# Patient Record
Sex: Male | Born: 1965 | Race: White | Hispanic: No | Marital: Married
Health system: Midwestern US, Community
[De-identification: ages and names within clinical notes are randomized; demographics above are authoritative.]

## PROBLEM LIST (undated history)

## (undated) DIAGNOSIS — J449 Chronic obstructive pulmonary disease, unspecified: Secondary | ICD-10-CM

## (undated) DIAGNOSIS — J439 Emphysema, unspecified: Secondary | ICD-10-CM

## (undated) DIAGNOSIS — J189 Pneumonia, unspecified organism: Secondary | ICD-10-CM

## (undated) HISTORY — PX: BACK SURGERY: SHX140

## (undated) HISTORY — DX: Emphysema, unspecified: J43.9

## (undated) HISTORY — PX: CARDIAC VALVE REPLACEMENT: SHX585

## (undated) MED FILL — Dexamethasone Sodium Phosphate Inj 100 MG/10ML: INTRAMUSCULAR | Qty: 1 | Status: AC

---

## 2004-11-11 ENCOUNTER — Emergency Department: Payer: Self-pay | Admitting: Emergency Medicine

## 2004-11-12 ENCOUNTER — Other Ambulatory Visit: Payer: Self-pay

## 2005-05-30 ENCOUNTER — Emergency Department: Payer: Self-pay | Admitting: Unknown Physician Specialty

## 2008-01-30 ENCOUNTER — Emergency Department: Payer: Self-pay | Admitting: Emergency Medicine

## 2010-12-08 ENCOUNTER — Inpatient Hospital Stay: Payer: Self-pay | Admitting: Internal Medicine

## 2012-11-05 ENCOUNTER — Emergency Department: Payer: Self-pay | Admitting: Emergency Medicine

## 2012-11-05 LAB — BASIC METABOLIC PANEL
BUN: 10 mg/dL (ref 7–18)
Chloride: 107 mmol/L (ref 98–107)
Co2: 26 mmol/L (ref 21–32)
Creatinine: 1.07 mg/dL (ref 0.60–1.30)
EGFR (Non-African Amer.): >60
Osmolality: 275 (ref 275–301)
Potassium: 3.9 mmol/L (ref 3.5–5.1)
Sodium: 137 mmol/L (ref 136–145)

## 2012-11-05 LAB — CBC
HCT: 41.4 % (ref 40.0–52.0)
HGB: 13.7 g/dL (ref 13.0–18.0)
MCH: 30.2 pg (ref 26.0–34.0)
MCHC: 33 g/dL (ref 32.0–36.0)
RBC: 4.52 10*6/uL (ref 4.40–5.90)
RDW: 13 % (ref 11.5–14.5)
WBC: 10 10*3/uL (ref 3.8–10.6)

## 2013-11-06 ENCOUNTER — Emergency Department: Payer: Self-pay | Admitting: Emergency Medicine

## 2013-11-28 DIAGNOSIS — M5416 Radiculopathy, lumbar region: Secondary | ICD-10-CM | POA: Insufficient documentation

## 2013-11-28 DIAGNOSIS — M549 Dorsalgia, unspecified: Secondary | ICD-10-CM | POA: Insufficient documentation

## 2014-04-30 ENCOUNTER — Emergency Department: Payer: Self-pay | Admitting: Emergency Medicine

## 2014-08-25 ENCOUNTER — Encounter (HOSPITAL_COMMUNITY): Payer: Self-pay | Admitting: Emergency Medicine

## 2014-08-25 ENCOUNTER — Emergency Department (HOSPITAL_COMMUNITY)
Admission: EM | Admit: 2014-08-25 | Discharge: 2014-08-25 | Disposition: A | Discharge status: Home / Self Care | Payer: Self-pay | Attending: Emergency Medicine | Admitting: Emergency Medicine

## 2014-08-25 ENCOUNTER — Emergency Department (HOSPITAL_COMMUNITY): Payer: Self-pay

## 2014-08-25 DIAGNOSIS — R0602 Shortness of breath: Secondary | ICD-10-CM | POA: Insufficient documentation

## 2014-08-25 DIAGNOSIS — J441 Chronic obstructive pulmonary disease with (acute) exacerbation: Secondary | ICD-10-CM | POA: Insufficient documentation

## 2014-08-25 DIAGNOSIS — F17200 Nicotine dependence, unspecified, uncomplicated: Secondary | ICD-10-CM | POA: Insufficient documentation

## 2014-08-25 DIAGNOSIS — R079 Chest pain, unspecified: Secondary | ICD-10-CM | POA: Insufficient documentation

## 2014-08-25 HISTORY — DX: Chronic obstructive pulmonary disease, unspecified: J44.9

## 2014-08-25 LAB — BASIC METABOLIC PANEL
Anion gap: 15 (ref 5–15)
BUN: 4 mg/dL — ABNORMAL LOW (ref 6–23)
CO2: 21 mEq/L (ref 19–32)
Calcium: 9.2 mg/dL (ref 8.4–10.5)
Chloride: 96 mEq/L (ref 96–112)
Creatinine, Ser: 0.81 mg/dL (ref 0.50–1.35)
GFR calc Af Amer: >90 mL/min (ref >90)
Glucose, Bld: 96 mg/dL (ref 70–99)
POTASSIUM: 3.9 meq/L (ref 3.7–5.3)
SODIUM: 132 meq/L — AB (ref 137–147)

## 2014-08-25 LAB — CBC
HCT: 44.4 % (ref 39.0–52.0)
HEMOGLOBIN: 15.9 g/dL (ref 13.0–17.0)
MCH: 31.2 pg (ref 26.0–34.0)
MCHC: 35.8 g/dL (ref 30.0–36.0)
MCV: 87.2 fL (ref 78.0–100.0)
Platelets: 308 10*3/uL (ref 150–400)
RBC: 5.09 MIL/uL (ref 4.22–5.81)
RDW: 12.8 % (ref 11.5–15.5)
WBC: 8.7 10*3/uL (ref 4.0–10.5)

## 2014-08-25 LAB — PRO B NATRIURETIC PEPTIDE: PRO B NATRI PEPTIDE: 127.5 pg/mL — AB (ref 0–125)

## 2014-08-25 LAB — TROPONIN I: Troponin I: <0.3 ng/mL (ref <0.30)

## 2014-08-25 LAB — I-STAT TROPONIN, ED: TROPONIN I, POC: 0 ng/mL (ref 0.00–0.08)

## 2014-08-25 MED ORDER — PREDNISONE 20 MG PO TABS: 60.0000 mg | ORAL_TABLET | Freq: Every day | ORAL | Status: DC

## 2014-08-25 MED ORDER — ALBUTEROL SULFATE HFA 108 (90 BASE) MCG/ACT IN AERS: 2.0000 | INHALATION_SPRAY | RESPIRATORY_TRACT | Status: DC | PRN

## 2014-08-25 MED ORDER — KETOROLAC TROMETHAMINE 30 MG/ML IJ SOLN
30.0000 mg | Freq: Once | INTRAMUSCULAR | Status: AC
  Administered 2014-08-25: 30 mg via INTRAVENOUS
  Filled 2014-08-25: qty 1

## 2014-08-25 MED ORDER — HYDROCODONE-ACETAMINOPHEN 5-325 MG PO TABS: 2.0000 | ORAL_TABLET | ORAL | Status: DC | PRN

## 2014-08-25 MED ORDER — METHYLPREDNISOLONE SODIUM SUCC 125 MG IJ SOLR
125.0000 mg | Freq: Once | INTRAMUSCULAR | Status: AC
  Administered 2014-08-25: 125 mg via INTRAVENOUS
  Filled 2014-08-25: qty 2

## 2014-08-25 MED ORDER — AMOXICILLIN 500 MG PO CAPS: 500.0000 mg | ORAL_CAPSULE | Freq: Three times a day (TID) | ORAL | Status: DC

## 2014-08-25 MED ORDER — ALBUTEROL (5 MG/ML) CONTINUOUS INHALATION SOLN
10.0000 mg/h | INHALATION_SOLUTION | Freq: Once | RESPIRATORY_TRACT | Status: AC
  Administered 2014-08-25: 10 mg/h via RESPIRATORY_TRACT
  Filled 2014-08-25: qty 20

## 2014-08-25 NOTE — ED Provider Notes (Signed)
CSN: [id number]     Arrival date & time 08/25/14  2013 History   First MD Initiated Contact with Patient 08/25/14 2027     Chief Complaint  Patient presents with  . Chest Pain  . Shortness of Breath     (Consider location/radiation/quality/duration/timing/severity/associated sxs/prior Treatment) HPI Comments: Patient presents to the ER for evaluation of chest pain and shortness of breath. Symptoms began yesterday. Patient had onset of a sharp pain in the axilla region and a crampy pain across the left chest. He has had shortness of breath and cough associated with the symptoms. He reports 3-2 aspirin last night and it helped the pain, he went to sleep and woke up feeling better. This afternoon and evening he started having increasing pain, increasing shortness of breath, increasing cough. Pain is once again, a sharp and stabbing in nature, worsens with his cough and breathing. Patient has a history of COPD, he has been using his Proventil inhaler without improvement.  Patient is a 48 y.o. male presenting with chest pain and shortness of breath.  Chest Pain Associated symptoms: shortness of breath   Shortness of Breath Associated symptoms: chest pain     Past Medical History  Diagnosis Date  . COPD (chronic obstructive pulmonary disease)    Past Surgical History  Procedure Laterality Date  . Back surgery     History reviewed. No pertinent family history. History  Substance Use Topics  . Smoking status: Current Every Day Smoker  . Smokeless tobacco: Not on file  . Alcohol Use: Yes    Review of Systems  Respiratory: Positive for shortness of breath.   Cardiovascular: Positive for chest pain.  All other systems reviewed and are negative.     Allergies  Review of patient's allergies indicates no known allergies.  Home Medications   Prior to Admission medications   Medication Sig Start Date End Date Taking? Authorizing Provider  albuterol (PROVENTIL HFA;VENTOLIN HFA) 108  (90 BASE) MCG/ACT inhaler Inhale 2 puffs into the lungs as needed for wheezing or shortness of breath.   Yes Historical Provider, MD  aspirin EC 81 MG tablet Take 162 mg by mouth daily as needed (chest pain).   Yes Historical Provider, MD   BP 113/79  Pulse 78  Temp(Src) 98.1 F (36.7 C) (Oral)  Resp 21  Ht 6\' 2"  (1.88 m)  Wt 212 lb (96.163 kg)  BMI 27.21 kg/m2  SpO2 94% Physical Exam  Constitutional: He is oriented to person, place, and time. He appears well-developed and well-nourished. No distress.  HENT:  Head: Normocephalic and atraumatic.  Right Ear: Hearing normal.  Left Ear: Hearing normal.  Nose: Nose normal.  Mouth/Throat: Oropharynx is clear and moist and mucous membranes are normal.  Eyes: Conjunctivae and EOM are normal. Pupils are equal, round, and reactive to light.  Neck: Normal range of motion. Neck supple.  Cardiovascular: Regular rhythm, S1 normal and S2 normal.  Exam reveals no gallop and no friction rub.   No murmur heard. Pulmonary/Chest: Effort normal. No respiratory distress. He has decreased breath sounds. He has wheezes. He has rhonchi. He exhibits no tenderness.  Abdominal: Soft. Normal appearance and bowel sounds are normal. There is no hepatosplenomegaly. There is no tenderness. There is no rebound, no guarding, no tenderness at McBurney's point and negative Murphy's sign. No hernia.  Musculoskeletal: Normal range of motion.  Neurological: He is alert and oriented to person, place, and time. He has normal strength. No cranial nerve deficit or sensory deficit. Coordination  normal. GCS eye subscore is 4. GCS verbal subscore is 5. GCS motor subscore is 6.  Skin: Skin is warm, dry and intact. No rash noted. No cyanosis.  Psychiatric: He has a normal mood and affect. His speech is normal and behavior is normal. Thought content normal.    ED Course  Procedures (including critical care time) Labs Review Labs Reviewed  [place], ED    Imaging Review No results found.   EKG Interpretation   Date/Time:  Monday August 25 2014 20:20:49 EDT Ventricular Rate:  86 PR Interval:  154 QRS Duration: 80 QT Interval:  350 QTC Calculation: 418 R Axis:   35 Text Interpretation:  Normal sinus rhythm Possible Left atrial enlargement  Borderline ECG No previous tracing Confirmed by Marieke Lubke  MD, Gabrial Domine  ([id number]) on 08/25/2014 9:09:28 PM      MDM   Final diagnoses:  None   COPD exacerbation  Patient presents to the ER for evaluation of chest pain, cough, shortness of breath. Patient does have a history of COPD. Upon presentation, patient has moderate bronchospasm without any significant hypoxia. Cardiac workup was negative. Patient has been experiencing continuous pain for 2 days and it seems to be pulmonary in origin. EKG and troponin are unremarkable, no concern for acute coronary syndrome. The patient's chest x-ray did not show pneumonia or pneumothorax. No acute pathology, findings are consistent with COPD. Patient treated with Toradol, Solu-Medrol, continuous nebulizer with significant improvement in his aeration and decreased bronchospasm. Upon recheck liver oxygen saturation is 99%. Patient sleeping comfortably. Patient will be discharged with prednisone, continue bronchodilator therapy.    Orpah Greek, MD 08/25/14 [phone]

## 2014-08-25 NOTE — ED Notes (Signed)
Pt placed on monitor upon arrival to room. Pt monitored by blood pressure, pulse ox, and 5 lead. Pts family remains at bedside.

## 2014-08-25 NOTE — ED Notes (Signed)
pts vital signs updated and iv removed. Pt getting dressed awaiting discharge paperwork at bedside.

## 2014-08-25 NOTE — Discharge Instructions (Signed)
Chronic Obstructive Pulmonary Disease Exacerbation Chronic obstructive pulmonary disease (COPD) is a common lung condition in which airflow from the lungs is limited. COPD is a general term that can be used to describe many different lung problems that limit airflow, including chronic bronchitis and emphysema. COPD exacerbations are episodes when breathing symptoms become much worse and require extra treatment. Without treatment, COPD exacerbations can be life threatening, and frequent COPD exacerbations can cause further damage to your lungs. CAUSES   Respiratory infections.   Exposure to smoke.   Exposure to air pollution, chemical fumes, or dust. Sometimes there is no apparent cause or trigger. RISK FACTORS  Smoking cigarettes.  Older age.  Frequent prior COPD exacerbations. SIGNS AND SYMPTOMS   Increased coughing.   Increased thick spit (sputum) production.   Increased wheezing.   Increased shortness of breath.   Rapid breathing.   Chest tightness. DIAGNOSIS  Your medical history, a physical exam, and tests will help your health care provider make a diagnosis. Tests may include:  A chest X-ray.  Basic lab tests.  Sputum testing.  An arterial blood gas test. TREATMENT  Depending on the severity of your COPD exacerbation, you may need to be admitted to a hospital for treatment. Some of the treatments commonly used to treat COPD exacerbations are:   Antibiotic medicines.   Bronchodilators. These are drugs that expand the air passages. They may be given with an inhaler or nebulizer. Spacer devices may be needed to help improve drug delivery.  Corticosteroid medicines.  Supplemental oxygen therapy.  HOME CARE INSTRUCTIONS   Do not smoke. Quitting smoking is very important to prevent COPD from getting worse and exacerbations from happening as often.  Avoid exposure to all substances that irritate the airway, especially to tobacco smoke.   If you were  prescribed an antibiotic medicine, finish it all even if you start to feel better.  Take all medicines as directed by your health care provider.It is important to use correct technique with inhaled medicines.  Drink enough fluids to keep your urine clear or pale yellow (unless you have a medical condition that requires fluid restriction).  Use a cool mist vaporizer. This makes it easier to clear your chest when you cough.   If you have a home nebulizer and oxygen, continue to use them as directed.   Maintain all necessary vaccinations to prevent infections.   Exercise regularly.   Eat a healthy diet.   Keep all follow-up appointments as directed by your health care provider. SEEK IMMEDIATE MEDICAL CARE IF:  You have worsening shortness of breath.   You have trouble talking.   You have severe chest pain.  You have blood in your sputum.  You have a fever.  You have weakness, vomit repeatedly, or faint.   You feel confused.   You continue to get worse. MAKE SURE YOU:   Understand these instructions.  Will watch your condition.  Will get help right away if you are not doing well or get worse. Document Released: 10/09/2007 Document Revised: 04/28/2014 Document Reviewed: 08/16/2013 [facility] Patient Information 2015 [place], [place]. This information is not intended to replace advice given to you by your health care provider. Make sure you discuss any questions you have with your health care provider.   Emergency Department Resource Guide 1) Find a Doctor and Pay Out of Pocket Although you won't have to find out who is covered by your insurance plan, it is a good idea to ask around and get recommendations.  You will then need to call the office and see if the doctor you have chosen will accept you as a new patient and what types of options they offer for patients who are self-pay. Some doctors offer discounts or will set up payment plans for their patients who do not  have insurance, but you will need to ask so you aren't surprised when you get to your appointment.  2) Contact Your Local Health Department Not all health departments have doctors that can see patients for sick visits, but many do, so it is worth a call to see if yours does. If you don't know where your local health department is, you can check in your phone book. The CDC also has a tool to help you locate your state's health department, and many state websites also have listings of all of their local health departments.  3) Find a [facility] If your illness is not likely to be very severe or complicated, you may want to try a walk in clinic. These are popping up all over the country in pharmacies, drugstores, and shopping centers. They're usually staffed by nurse practitioners or physician assistants that have been trained to treat common illnesses and complaints. They're usually fairly quick and inexpensive. However, if you have serious medical issues or chronic medical problems, these are probably not your best option.  No Primary Care Doctor: - Call Health Connect at  [phone] - they can help you locate a primary care doctor that  accepts your insurance, provides certain services, etc. - Physician Referral Service- [phone]  Chronic Pain Problems: Organization         Address  Phone   Notes  [facility]  [phone] Patients need to be referred by their primary care doctor.   Medication Assistance: Organization         Address  Phone   Notes  [facility] [address] [place], [place] [address] [phone] --Must be a resident of [facility] -- Must have NO insurance coverage whatsoever (no Medicaid/ Medicare, etc.) -- The pt. MUST have a primary care doctor that directs their care regularly and follows them in the community   MedAssist  [phone]   [employer]  [phone]    Agencies that  provide inexpensive medical care: Organization         Address  Phone   Notes  [place]  [phone]   [facility]    [phone]   [facility] [place], [place] [address] [phone]   [place] [address] [place] [phone]   Planned Parenthood    [phone]   [facility]    [phone]   [place] and [place] [address], [place] Phone:  [phone], Fax:  [phone] Hours of Operation:  9 am - 6 pm, M-F.  Also accepts Medicaid/Medicare and self-pay.  [facility] for [address], [address], [place] Phone: [phone], Fax: [phone]. Hours of Operation:  8:30 am - 5:30 pm, M-F.  Also accepts Medicaid and self-pay.  [facility] [place] [address] [place] Phone: [phone]   [place], [place], [place] [phone], Ext. [phone] Mondays & Thursdays: 7-9 AM.  First 15 patients are seen on a first come,  first serve basis.    [place] Providers:  Organization         Address  Phone   Notes  [facility] [address], [place] [phone] Also accepts self-pay patients.  [facility] [address], [place]  [phone]   [place], [address], [place] [phone]   [facility] [address] [place] [phone]   Lucianne Lei [address], [place]   [phone] Only accepts [place] Access [place] patients after they have their name applied to their card.   Self-Pay (no insurance) in [facility]:  Organization         Address  Phone   Notes  Sickle Cell Patients, [facility] [place] [phone]   [facility]  Urgent Care [place] [phone]   [facility] [place]  [place], [place],  [phone]   [facility]/Dr. Osei-Bonsu  [address] [place] or [address], [place] [phone] Phone number for both [place] and [place] locations is the same.  Urgent Medical and [facility] [address] [place] [phone]   [facility] [address] [place] or [address] [phone]   [facility] [address], [place] [phone], phone; [phone], fax Sees patients 1st and 3rd Saturday of every month.  Must not qualify for public or private insurance (i.e. Medicaid, Medicare, [place] Health Choice, Veterans' Benefits)  Household income should be no more than 200% of the poverty level The clinic cannot treat you if you are pregnant or think you are pregnant  Sexually transmitted diseases are not treated at the clinic.    Dental Care: Organization         Address  Phone  Notes  [facility] Department of [facility] [place] [phone] Accepts children up to age 20 who are enrolled in [place] or [place]; pregnant women with a Medicaid card; and children who have applied for Medicaid or [place] Health Choice, but were declined, whose parents can pay a reduced fee at time of service.  [facility] Department of [facility]  [address], [place] [phone] Accepts children up to age 63 who are enrolled in [place] or [place]; pregnant women with a Medicaid card; and children who have applied for Medicaid or [place] Health Choice, but were declined, whose parents can pay a reduced fee at time of service.  [place] Adult Dental Access PROGRAM  [place] [phone] Patients are seen by appointment only. Walk-ins  are not accepted. [place] will see patients 78 years of age and older. Monday - Tuesday (8am-5pm) Most Wednesdays (8:30-5pm) $30 per visit, cash only  [facility] [place] Adult Dental Access PROGRAM  [address], [facility] [phone] Patients are seen by appointment only. Walk-ins are not accepted. [place] will see patients 60 years of age and older. One Wednesday Evening (Monthly: Volunteer Based).  $30 per visit, cash only  [place]  [phone] for adults; Children under age 65, call Graduate Pediatric Dentistry at [phone]. Children aged 52-14, please  call [phone] to request a pediatric application.  Dental services are provided in all areas of dental care including fillings, crowns and bridges, complete and partial dentures, implants, gum treatment, root canals, and extractions. Preventive care is also provided. Treatment is provided to both adults and children. Patients are selected via a lottery and there is often a waiting list.   [facility] [address] [place]  [phone] [email or web address]   [facility] [address] [place], [place] [phone], Ext. [phone] Second and Fourth Thursday of each month, opens at 6:30 AM; Clinic ends at 9 AM.  Patients are seen on a first-come first-served basis, and a limited number are seen during each clinic.   [facility]  [address] Hillard Danker [place], [place] [phone]   Eligibility Requirements You must have lived in [place], [place], or [place] counties for at least the last three months.   You cannot be eligible for state or federal sponsored [employer], including [employer], [place], or [employer].   You generally cannot be eligible for healthcare insurance through your employer.    How to apply: Eligibility screenings are held every Tuesday and Wednesday afternoon from 1:00 pm until 4:00 pm. You do not need an appointment  for the interview!  [facility] [address] [place], [place]   [place]  [place] Department  [place]  [phone]    Behavioral Health Resources in the Community: Intensive Outpatient Programs Organization         Address  Phone  Notes  [place]. [address] [place], [place] [phone]   [facility] Outpatient [address], [place], [place]   ADS: Alcohol & Drug Svcs [address] [place], [place]   [place] [address],  [place], [place] or [phone]   Substance Abuse Resources Organization         Address  Phone  Notes  Alcohol and Drug Services  [phone]   [place]  [phone]   The [place]   Chinita Pester  [phone]   Residential & Outpatient Substance Abuse Program  [phone]   Psychological Services Organization         Address  Phone  Notes  [facility] [place]  [place]  [phone]   [place] [address] [place] or [phone]    Mobile Crisis Teams Organization         Address  Phone  Notes  [facility], Mobile Crisis Care Unit  [phone]   [facility]  [address] [place], [place]   [place] [address], [place] [phone]    Self-Help/Support Groups Organization         Address  Phone             Notes  [place]. of [place] - variety of support groups  [place] Call for more information  Narcotics Anonymous (NA), [facility] [address], [employer] [place]  2 meetings at this location   Special educational needs teacher         Address  Phone  Notes  ASAP Residential  Treatment [place],    [place]  [phone]   [facility]  House  [address] [place] [address], [place], [place]   [place], [place] [phone] Admissions: 8am-3pm M-F  Incentives Substance [place] [address],    [place], [place] [phone]   [facility] [address] [place], [place], [place]   [facility] [address],  [place], [place]   [facility] - Intensive Outpatient [address], [place], [place]   [facility] [address].,  [place], [place] [phone] or [phone]   [facility] Services (RTS) [address] [place], [place] Accepts Medicaid  [facility] [place] [address],  Friend [place] [phone] Substance Abuse/Addiction Treatment   [facility] Organization         Address  Phone  Notes  [facility]  [phone]   Domenic Schwab, PhD [address] [place], [place]   [phone] or [phone]   [employer] [facility] [place], [place] [phone]   [facility] [address], [place], [place] [phone] Insurance/Medicaid/sponsorship through [facility] and Families [address], [address] [place] [address]                                    [place], [place] [phone] [place] [address], [place] [phone]    Dr. Adele Schilder  [phone]   [facility] Dept. 1) [address], [place] 2) [place] 3)  [place] [address], [place] [phone]  [phone]   [place] [phone] or [phone] (After Hours)

## 2014-08-25 NOTE — ED Notes (Signed)
Pt has had 6 beers and 2 1/2 packs (approx 5) cigarettes today. Last drink at 1800.

## 2014-08-25 NOTE — ED Notes (Signed)
Left sided cp that started yesterday. Feels like cramping/sharp pain. Took x 2 asa yesterday and help some. Hurts more when breathing. Productive cough - Desta Bujak sputum.

## 2014-12-04 ENCOUNTER — Emergency Department: Payer: Self-pay | Admitting: Emergency Medicine

## 2014-12-04 LAB — BASIC METABOLIC PANEL
Anion Gap: 6 — ABNORMAL LOW (ref 7–16)
BUN: 6 mg/dL — ABNORMAL LOW (ref 7–18)
CREATININE: 0.91 mg/dL (ref 0.60–1.30)
Calcium, Total: 8.6 mg/dL (ref 8.5–10.1)
Chloride: 108 mmol/L — ABNORMAL HIGH (ref 98–107)
Co2: 26 mmol/L (ref 21–32)
EGFR (African American): >60
Glucose: 100 mg/dL — ABNORMAL HIGH (ref 65–99)
Osmolality: 277 (ref 275–301)
POTASSIUM: 4.2 mmol/L (ref 3.5–5.1)
Sodium: 140 mmol/L (ref 136–145)

## 2014-12-04 LAB — CBC WITH DIFFERENTIAL/PLATELET
BASOS ABS: 0.1 10*3/uL (ref 0.0–0.1)
Basophil %: 0.7 %
Eosinophil #: 0.3 10*3/uL (ref 0.0–0.7)
Eosinophil %: 2.2 %
HCT: 43.4 % (ref 40.0–52.0)
HGB: 14.5 g/dL (ref 13.0–18.0)
Lymphocyte #: 2.1 10*3/uL (ref 1.0–3.6)
Lymphocyte %: 14.5 %
MCH: 31.1 pg (ref 26.0–34.0)
MCHC: 33.5 g/dL (ref 32.0–36.0)
MCV: 93 fL (ref 80–100)
Monocyte #: 1.5 x10 3/mm — ABNORMAL HIGH (ref 0.2–1.0)
Monocyte %: 10.1 %
NEUTROS ABS: 10.7 10*3/uL — AB (ref 1.4–6.5)
Neutrophil %: 72.5 %
Platelet: 261 10*3/uL (ref 150–440)
RBC: 4.68 10*6/uL (ref 4.40–5.90)
RDW: 13.2 % (ref 11.5–14.5)
WBC: 14.7 10*3/uL — AB (ref 3.8–10.6)

## 2015-12-27 HISTORY — PX: BACK SURGERY: SHX140

## 2018-02-27 ENCOUNTER — Inpatient Hospital Stay
Admit: 2018-02-27 | Discharge: 2018-02-28 | Disposition: A | Discharge status: Home / Self Care | Payer: PRIVATE HEALTH INSURANCE

## 2018-02-27 DIAGNOSIS — K5900 Constipation, unspecified: Secondary | ICD-10-CM

## 2018-02-27 NOTE — ED Notes (Signed)
Patient ambulated to restroom to provide urine specimen.     Darlen Round, RN  02/27/18 [phone]

## 2018-02-27 NOTE — ED Provider Notes (Signed)
[facility]      ??  Department of Emergency Medicine   ED  Provider Note  Admit Date/Time: 02/27/2018  9:30 PM  ED Bed: HALL04/H4  Chief Complaint     Abdominal Pain (abd pain since yesterday no vomiting or diarrhea)    History of Present Illness   Source of history provided by:  patient.  History/Exam Limitations: none.       Micheal Bailey is a 52 y.o. old male who has a past medical history of:   Past Medical History:   Diagnosis Date   ??? Diabetes mellitus (HCC)     presents to the emergency department by private vehicle, for complaints of gradual onset aching pain in the RLQ without radiation which began 1 day(s) prior to arrival.  There has been no similar episodes in the past.   Since onset the symptoms have been persistent.  The pain is associated with nothing pertinent.  The pain is aggravated by movement and pressure and relieved by nothing. There has been NO back pain, chills, cloudy urine, constipation, diarrhea, dysuria, headache, hematuria, penile discharge, sweating, urinary frequency, urinary incontinence, urinary urgency, vomiting or fever.    .  ROS   Pertinent positives and negatives are stated within HPI, all other systems reviewed and are negative.    No past surgical history on file.Social History:  reports that he has never smoked. He has never used smokeless tobacco. He reports that he drank alcohol. He reports that he does not use drugs.  Family History: family history is not on file.   Allergies: Patient has no known allergies.    Physical Exam           ED Triage Vitals [02/27/18 1704]   BP Temp Temp Source Pulse Resp SpO2 Height Weight   136/89 98.5 ??F (36.9 ??C) Oral 103 19 95 % 5\' 9"  (1.753 m) 220 lb (99.8 kg)      Oxygen Saturation Interpretation: Normal.    ?? General Appearance/Constitutional:  Alert, development consistent with age.  ?? HEENT:  NC/NT. PERRLA.  Airway patent.  ?? Neck:  Supple.  No lymphadenopathy.  ?? Respiratory: Lungs Clear to auscultation and breath sounds  equal.  ?? CV:  Regular rate and rhythm.  ?? GI:  General Appearance: normal.         Bowel sounds: normal bowel sounds.             Distension:  None.              Tenderness: moderate tenderness is present in the RLQ.           Liver: non-tender.                         Spleen:  non-palpable and non-tender.         Pulsatile Mass: absent.           Hernia:  no inguinal or femoral hernias noted.  ?? Back: CVA Tenderness: No.  ?? Integument:  Normal turgor.  Warm, dry, without visible rash, unless noted elsewhere.  ?? Neurological:  Orientation age-appropriate.  Motor functions intact.    Lab / Imaging Results   (All laboratory and radiology results have been personally reviewed by myself)  Labs:  Results for orders placed or performed during the hospital encounter of 02/27/18   Comprehensive Metabolic Panel   Result Value Ref Range    Sodium 136 132 - 146 mmol/L  Potassium 4.0 3.5 - 5.0 mmol/L    Chloride 96 (L) 98 - 107 mmol/L    CO2 26 22 - 29 mmol/L    Anion Gap 14 7 - 16 mmol/L    Glucose 116 (H) 74 - 99 mg/dL    BUN 16 6 - 20 mg/dL    CREATININE 0.8 0.7 - 1.2 mg/dL    GFR Non-African American >60 >=60 mL/min/1.73    GFR African American >60     Calcium 9.9 8.6 - 10.2 mg/dL    Total Protein 8.3 6.4 - 8.3 g/dL    Alb 4.7 3.5 - 5.2 g/dL    Total Bilirubin 0.5 0.0 - 1.2 mg/dL    Alkaline Phosphatase 64 40 - 129 U/L    ALT 31 0 - 40 U/L    AST 24 0 - 39 U/L   CBC Auto Differential   Result Value Ref Range    WBC 9.3 4.5 - 11.5 E9/L    RBC 5.46 3.80 - 5.80 E12/L    Hemoglobin 15.8 12.5 - 16.5 g/dL    Hematocrit 30.849.1 65.737.0 - 54.0 %    MCV 89.9 80.0 - 99.9 fL    MCH 28.9 26.0 - 35.0 pg    MCHC 32.2 32.0 - 34.5 %    RDW 13.8 11.5 - 15.0 fL    Platelets 266 130 - 450 E9/L    MPV 10.5 7.0 - 12.0 fL    Neutrophils % 58.2 43.0 - 80.0 %    Immature Granulocytes % 0.4 0.0 - 5.0 %    Lymphocytes % 27.5 20.0 - 42.0 %    Monocytes % 11.4 2.0 - 12.0 %    Eosinophils % 1.9 0.0 - 6.0 %    Basophils % 0.6 0.0 - 2.0 %    Neutrophils #  5.39 1.80 - 7.30 E9/L    Immature Granulocytes # 0.04 E9/L    Lymphocytes # 2.55 1.50 - 4.00 E9/L    Monocytes # 1.06 (H) 0.10 - 0.95 E9/L    Eosinophils # 0.18 0.05 - 0.50 E9/L    Basophils # 0.06 0.00 - 0.20 E9/L   Lipase   Result Value Ref Range    Lipase 35 13 - 60 U/L   Urinalysis   Result Value Ref Range    Color, UA Yellow Straw/Yellow    Clarity, UA Clear Clear    Glucose, Ur 500 (A) Negative mg/dL    Bilirubin Urine SMALL (A) Negative    Ketones, Urine 15 (A) Negative mg/dL    Specific Gravity, UA >=1.030 1.005 - 1.030    Blood, Urine Negative Negative    pH, UA 5.5 5.0 - 9.0    Protein, UA Negative Negative mg/dL    Urobilinogen, Urine 0.2 <2.0 E.U./dL    Nitrite, Urine Negative Negative    Leukocyte Esterase, Urine Negative Negative   Lactic acid, plasma   Result Value Ref Range    Lactic Acid 0.9 0.5 - 2.2 mmol/L     Imaging:  All Radiology results interpreted by Radiologist unless otherwise noted.  CT ABDOMEN PELVIS W IV CONTRAST Additional Contrast? Oral    (Results Pending)       ED Course / Medical Decision Making     Medications   0.9 % sodium chloride bolus (0 mLs Intravenous Stopped 02/28/18 0033)   morphine injection 8 mg (8 mg Intravenous Given 02/27/18 2344)   iopamidol (ISOVUE-370) 76 % injection 80 mL (80 mLs Intravenous Given 02/28/18 0050)  iohexol (OMNIPAQUE 240) injection 50 mL (50 mLs Oral Given 02/28/18 0046)        Re-Evaluations:  02/27/18      Time: 0115    Patient???s symptoms are improving.  Negative for acute abdomen.    Consultations:             None    Procedures:   none    MDM:  Patient presented with abdominal pain.  His diagnostics reviewed and discussed with him.  CT of the abdomen and pelvis was negative for acute pathology except for a large amount of stool.  Patient's symptoms were improved and emergency department treatment.  Patient will be discharged with prescription for magnesium citrate.  He is instructed to follow-up with primary care.  He is instructed to return to the  emergency department with any new or worsening symptoms.    Counseling:   I have spoken with the patient and discussed today???s results, in addition to providing specific details for the plan of care and counseling regarding the diagnosis and prognosis and are agreeable with the plan.     Assessment      1. Constipation, unspecified constipation type      This patient's ED course included: a personal history and physicial examination  This patient has remained hemodynamically stable during their ED course.   Plan   Discharge to home.  Patient condition is good.    New Medications     New Prescriptions    MAGNESIUM CITRATE 1.745 GM/30ML SOLUTION    Take 150 mLs by mouth daily for 2 days PO QD AS NEEDED FOR CONSTIPATION.     Electronically signed by Silas Sacramento, APRN - CNP   DD: 02/27/18  **This report was transcribed using voice recognition software. Every effort was made to ensure accuracy; however, inadvertent computerized transcription errors may be present.  END OF PROVIDER NOTE          Casandra Doffing, APRN - CNP  02/28/18 [phone]

## 2018-02-28 ENCOUNTER — Emergency Department: Admit: 2018-02-28 | Payer: PRIVATE HEALTH INSURANCE

## 2018-02-28 LAB — URINALYSIS
Blood, Urine: NEGATIVE
Glucose, Ur: 500 mg/dL — AB
Ketones, Urine: 15 mg/dL — AB
Leukocyte Esterase, Urine: NEGATIVE
Nitrite, Urine: NEGATIVE
Protein, UA: NEGATIVE mg/dL
Specific Gravity, UA: >=1.03 (ref 1.005–1.030)
Urobilinogen, Urine: 0.2 E.U./dL (ref <2.0)
pH, UA: 5.5 (ref 5.0–9.0)

## 2018-02-28 LAB — COMPREHENSIVE METABOLIC PANEL
ALT: 31 U/L (ref 0–40)
AST: 24 U/L (ref 0–39)
Albumin: 4.7 g/dL (ref 3.5–5.2)
Alkaline Phosphatase: 64 U/L (ref 40–129)
Anion Gap: 14 mmol/L (ref 7–16)
BUN: 16 mg/dL (ref 6–20)
CO2: 26 mmol/L (ref 22–29)
Calcium: 9.9 mg/dL (ref 8.6–10.2)
Chloride: 96 mmol/L — ABNORMAL LOW (ref 98–107)
Creatinine: 0.8 mg/dL (ref 0.7–1.2)
GFR African American: >60
GFR Non-African American: >60 mL/min/{1.73_m2} (ref >=60)
Glucose: 116 mg/dL — ABNORMAL HIGH (ref 74–99)
Potassium: 4 mmol/L (ref 3.5–5.0)
Sodium: 136 mmol/L (ref 132–146)
Total Bilirubin: 0.5 mg/dL (ref 0.0–1.2)
Total Protein: 8.3 g/dL (ref 6.4–8.3)

## 2018-02-28 LAB — CBC WITH AUTO DIFFERENTIAL
Basophils %: 0.6 % (ref 0.0–2.0)
Basophils Absolute: 0.06 E9/L (ref 0.00–0.20)
Eosinophils %: 1.9 % (ref 0.0–6.0)
Eosinophils Absolute: 0.18 E9/L (ref 0.05–0.50)
Hematocrit: 49.1 % (ref 37.0–54.0)
Hemoglobin: 15.8 g/dL (ref 12.5–16.5)
Immature Granulocytes #: 0.04 E9/L
Immature Granulocytes %: 0.4 % (ref 0.0–5.0)
Lymphocytes %: 27.5 % (ref 20.0–42.0)
Lymphocytes Absolute: 2.55 E9/L (ref 1.50–4.00)
MCH: 28.9 pg (ref 26.0–35.0)
MCHC: 32.2 % (ref 32.0–34.5)
MCV: 89.9 fL (ref 80.0–99.9)
MPV: 10.5 fL (ref 7.0–12.0)
Monocytes %: 11.4 % (ref 2.0–12.0)
Monocytes Absolute: 1.06 E9/L — ABNORMAL HIGH (ref 0.10–0.95)
Neutrophils %: 58.2 % (ref 43.0–80.0)
Neutrophils Absolute: 5.39 E9/L (ref 1.80–7.30)
Platelets: 266 E9/L (ref 130–450)
RBC: 5.46 E12/L (ref 3.80–5.80)
RDW: 13.8 fL (ref 11.5–15.0)
WBC: 9.3 E9/L (ref 4.5–11.5)

## 2018-02-28 LAB — LACTIC ACID: Lactic Acid: 0.9 mmol/L (ref 0.5–2.2)

## 2018-02-28 LAB — LIPASE: Lipase: 35 U/L (ref 13–60)

## 2018-02-28 MED ORDER — IOHEXOL 240 MG/ML IJ SOLN
240 MG/ML | Freq: Once | INTRAMUSCULAR | Status: AC | PRN
Start: 2018-02-28 — End: 2018-02-28
  Administered 2018-02-28: 06:00:00 50 mL via ORAL

## 2018-02-28 MED ORDER — MAGNESIUM CITRATE 1.745 GM/30ML PO SOLN
1.745 GM/30ML | Freq: Every day | ORAL | 0 refills | Status: AC
Start: 2018-02-28 — End: 2018-03-02

## 2018-02-28 MED ORDER — SODIUM CHLORIDE 0.9 % IV BOLUS
0.9 % | Freq: Once | INTRAVENOUS | Status: AC
Start: 2018-02-28 — End: 2018-02-28
  Administered 2018-02-28: 05:00:00 1000 mL via INTRAVENOUS

## 2018-02-28 MED ORDER — MORPHINE SULFATE 10 MG/ML IJ SOLN
10 MG/ML | Freq: Once | INTRAMUSCULAR | Status: AC
Start: 2018-02-28 — End: 2018-02-27
  Administered 2018-02-28: 05:00:00 8 mg via INTRAVENOUS

## 2018-02-28 MED ORDER — IOPAMIDOL 76 % IV SOLN
76 % | Freq: Once | INTRAVENOUS | Status: AC | PRN
Start: 2018-02-28 — End: 2018-02-28
  Administered 2018-02-28: 06:00:00 80 mL via INTRAVENOUS

## 2018-02-28 MED FILL — SODIUM CHLORIDE 0.9 % IV SOLN: 0.9 % | INTRAVENOUS | Qty: 1000

## 2018-02-28 MED FILL — MORPHINE SULFATE 10 MG/ML IJ SOLN: 10 mg/mL | INTRAMUSCULAR | Qty: 1

## 2018-02-28 NOTE — ED Notes (Signed)
Pt to CT via cart      Francetta Found, RN  02/28/18 [phone]

## 2020-06-25 DIAGNOSIS — C3490 Malignant neoplasm of unspecified part of unspecified bronchus or lung: Secondary | ICD-10-CM

## 2020-06-25 HISTORY — DX: Malignant neoplasm of unspecified part of unspecified bronchus or lung: C34.90

## 2020-07-19 ENCOUNTER — Encounter (HOSPITAL_COMMUNITY): Payer: Self-pay

## 2020-07-19 ENCOUNTER — Emergency Department (HOSPITAL_COMMUNITY)
Admission: EM | Admit: 2020-07-19 | Discharge: 2020-07-19 | Disposition: A | Discharge status: Home / Self Care | Payer: Medicaid Other | Attending: Emergency Medicine | Admitting: Emergency Medicine

## 2020-07-19 DIAGNOSIS — J449 Chronic obstructive pulmonary disease, unspecified: Secondary | ICD-10-CM | POA: Insufficient documentation

## 2020-07-19 DIAGNOSIS — M25512 Pain in left shoulder: Secondary | ICD-10-CM | POA: Diagnosis not present

## 2020-07-19 DIAGNOSIS — Z5321 Procedure and treatment not carried out due to patient leaving prior to being seen by health care provider: Secondary | ICD-10-CM | POA: Diagnosis not present

## 2020-07-19 DIAGNOSIS — M79602 Pain in left arm: Secondary | ICD-10-CM | POA: Insufficient documentation

## 2020-07-19 DIAGNOSIS — R042 Hemoptysis: Secondary | ICD-10-CM | POA: Diagnosis not present

## 2020-07-19 DIAGNOSIS — R0781 Pleurodynia: Secondary | ICD-10-CM | POA: Diagnosis present

## 2020-07-19 DIAGNOSIS — R0602 Shortness of breath: Secondary | ICD-10-CM | POA: Diagnosis not present

## 2020-07-19 LAB — BASIC METABOLIC PANEL
Anion gap: 13 (ref 5–15)
BUN: <5 mg/dL — ABNORMAL LOW (ref 6–20)
CO2: 22 mmol/L (ref 22–32)
Calcium: 9.2 mg/dL (ref 8.9–10.3)
Chloride: 90 mmol/L — ABNORMAL LOW (ref 98–111)
Creatinine, Ser: 0.64 mg/dL (ref 0.61–1.24)
GFR calc Af Amer: >60 mL/min (ref >60)
GFR calc non Af Amer: >60 mL/min (ref >60)
Glucose, Bld: 88 mg/dL (ref 70–99)
Potassium: 3.7 mmol/L (ref 3.5–5.1)
Sodium: 125 mmol/L — ABNORMAL LOW (ref 135–145)

## 2020-07-19 LAB — CBC
HCT: 36.4 % — ABNORMAL LOW (ref 39.0–52.0)
Hemoglobin: 12.1 g/dL — ABNORMAL LOW (ref 13.0–17.0)
MCH: 28.2 pg (ref 26.0–34.0)
MCHC: 33.2 g/dL (ref 30.0–36.0)
MCV: 84.8 fL (ref 80.0–100.0)
Platelets: 428 10*3/uL — ABNORMAL HIGH (ref 150–400)
RBC: 4.29 MIL/uL (ref 4.22–5.81)
RDW: 13.4 % (ref 11.5–15.5)
WBC: 15.1 10*3/uL — ABNORMAL HIGH (ref 4.0–10.5)
nRBC: 0 % (ref 0.0–0.2)

## 2020-07-19 LAB — TROPONIN I (HIGH SENSITIVITY): Troponin I (High Sensitivity): 15 ng/L (ref <18)

## 2020-07-19 NOTE — ED Triage Notes (Signed)
Pt arrives POV for eval of L sided rib cage, arm and shoulder pain x 3 mos. Pt reports hemoptysis x3 mos as well, endorses SOB, hx of COPD states worse than normal.

## 2020-07-19 NOTE — ED Notes (Signed)
Called for recheck vitals, no answer

## 2020-07-19 NOTE — ED Notes (Signed)
Pt called,no answer.

## 2020-07-20 ENCOUNTER — Emergency Department: Payer: Medicaid Other

## 2020-07-20 ENCOUNTER — Inpatient Hospital Stay: Payer: Medicaid Other

## 2020-07-20 ENCOUNTER — Other Ambulatory Visit: Payer: Self-pay

## 2020-07-20 ENCOUNTER — Inpatient Hospital Stay
Admission: EM | Admit: 2020-07-20 | Discharge: 2020-07-21 | DRG: 194 | Disposition: A | Discharge status: Home / Self Care | Payer: Medicaid Other | Attending: Internal Medicine | Admitting: Internal Medicine

## 2020-07-20 DIAGNOSIS — J189 Pneumonia, unspecified organism: Secondary | ICD-10-CM | POA: Diagnosis not present

## 2020-07-20 DIAGNOSIS — J440 Chronic obstructive pulmonary disease with acute lower respiratory infection: Secondary | ICD-10-CM | POA: Diagnosis present

## 2020-07-20 DIAGNOSIS — Z7982 Long term (current) use of aspirin: Secondary | ICD-10-CM

## 2020-07-20 DIAGNOSIS — E222 Syndrome of inappropriate secretion of antidiuretic hormone: Secondary | ICD-10-CM | POA: Diagnosis present

## 2020-07-20 DIAGNOSIS — F17210 Nicotine dependence, cigarettes, uncomplicated: Secondary | ICD-10-CM | POA: Diagnosis present

## 2020-07-20 DIAGNOSIS — R918 Other nonspecific abnormal finding of lung field: Secondary | ICD-10-CM

## 2020-07-20 DIAGNOSIS — J984 Other disorders of lung: Secondary | ICD-10-CM

## 2020-07-20 DIAGNOSIS — F17200 Nicotine dependence, unspecified, uncomplicated: Secondary | ICD-10-CM | POA: Diagnosis present

## 2020-07-20 DIAGNOSIS — Z79899 Other long term (current) drug therapy: Secondary | ICD-10-CM

## 2020-07-20 DIAGNOSIS — R609 Edema, unspecified: Secondary | ICD-10-CM | POA: Diagnosis present

## 2020-07-20 DIAGNOSIS — Z20822 Contact with and (suspected) exposure to covid-19: Secondary | ICD-10-CM | POA: Diagnosis present

## 2020-07-20 DIAGNOSIS — R042 Hemoptysis: Secondary | ICD-10-CM | POA: Diagnosis present

## 2020-07-20 DIAGNOSIS — R160 Hepatomegaly, not elsewhere classified: Secondary | ICD-10-CM | POA: Diagnosis present

## 2020-07-20 DIAGNOSIS — E871 Hypo-osmolality and hyponatremia: Secondary | ICD-10-CM

## 2020-07-20 LAB — CBC WITH DIFFERENTIAL/PLATELET
Abs Immature Granulocytes: 0.06 10*3/uL (ref 0.00–0.07)
Basophils Absolute: 0.1 10*3/uL (ref 0.0–0.1)
Basophils Relative: 1 %
Eosinophils Absolute: 0.1 10*3/uL (ref 0.0–0.5)
Eosinophils Relative: 1 %
HCT: 38.1 % — ABNORMAL LOW (ref 39.0–52.0)
Hemoglobin: 13.3 g/dL (ref 13.0–17.0)
Immature Granulocytes: 1 %
Lymphocytes Relative: 15 %
Lymphs Abs: 1.7 10*3/uL (ref 0.7–4.0)
MCH: 29.1 pg (ref 26.0–34.0)
MCHC: 34.9 g/dL (ref 30.0–36.0)
MCV: 83.4 fL (ref 80.0–100.0)
Monocytes Absolute: 1.2 10*3/uL — ABNORMAL HIGH (ref 0.1–1.0)
Monocytes Relative: 10 %
Neutro Abs: 8.7 10*3/uL — ABNORMAL HIGH (ref 1.7–7.7)
Neutrophils Relative %: 72 %
Platelets: 414 10*3/uL — ABNORMAL HIGH (ref 150–400)
RBC: 4.57 MIL/uL (ref 4.22–5.81)
RDW: 13.5 % (ref 11.5–15.5)
WBC: 12 10*3/uL — ABNORMAL HIGH (ref 4.0–10.5)
nRBC: 0 % (ref 0.0–0.2)

## 2020-07-20 LAB — COMPREHENSIVE METABOLIC PANEL
ALT: 13 U/L (ref 0–44)
AST: 16 U/L (ref 15–41)
Albumin: 3.9 g/dL (ref 3.5–5.0)
Alkaline Phosphatase: 81 U/L (ref 38–126)
Anion gap: 8 (ref 5–15)
BUN: 9 mg/dL (ref 6–20)
CO2: 27 mmol/L (ref 22–32)
Calcium: 9.1 mg/dL (ref 8.9–10.3)
Chloride: 94 mmol/L — ABNORMAL LOW (ref 98–111)
Creatinine, Ser: 0.7 mg/dL (ref 0.61–1.24)
GFR calc Af Amer: >60 mL/min (ref >60)
GFR calc non Af Amer: >60 mL/min (ref >60)
Glucose, Bld: 104 mg/dL — ABNORMAL HIGH (ref 70–99)
Potassium: 4.7 mmol/L (ref 3.5–5.1)
Sodium: 129 mmol/L — ABNORMAL LOW (ref 135–145)
Total Bilirubin: 0.6 mg/dL (ref 0.3–1.2)
Total Protein: 8.1 g/dL (ref 6.5–8.1)

## 2020-07-20 LAB — SARS CORONAVIRUS 2 BY RT PCR (HOSPITAL ORDER, PERFORMED IN ~~LOC~~ HOSPITAL LAB): SARS Coronavirus 2: NEGATIVE

## 2020-07-20 MED ORDER — SODIUM CHLORIDE 0.9 % IV SOLN: 250.0000 mL | INTRAVENOUS | Status: DC | PRN

## 2020-07-20 MED ORDER — SODIUM CHLORIDE 0.9 % IV SOLN
1.0000 g | Freq: Once | INTRAVENOUS | Status: AC
  Administered 2020-07-20: 1 g via INTRAVENOUS
  Filled 2020-07-20: qty 10

## 2020-07-20 MED ORDER — IOHEXOL 300 MG/ML  SOLN
75.0000 mL | Freq: Once | INTRAMUSCULAR | Status: AC | PRN
  Administered 2020-07-20: 75 mL via INTRAVENOUS
  Filled 2020-07-20: qty 75

## 2020-07-20 MED ORDER — SODIUM CHLORIDE 0.9% FLUSH
3.0000 mL | Freq: Two times a day (BID) | INTRAVENOUS | Status: DC
  Administered 2020-07-20 – 2020-07-21 (×3): 3 mL via INTRAVENOUS

## 2020-07-20 MED ORDER — ACETAMINOPHEN 650 MG RE SUPP: 650.0000 mg | Freq: Four times a day (QID) | RECTAL | Status: DC | PRN

## 2020-07-20 MED ORDER — AZITHROMYCIN 500 MG PO TABS
500.0000 mg | ORAL_TABLET | Freq: Every day | ORAL | Status: DC
  Administered 2020-07-21: 11:00:00 500 mg via ORAL
  Filled 2020-07-20: qty 1

## 2020-07-20 MED ORDER — ONDANSETRON HCL 4 MG PO TABS: 4.0000 mg | ORAL_TABLET | Freq: Four times a day (QID) | ORAL | Status: DC | PRN

## 2020-07-20 MED ORDER — NICOTINE 21 MG/24HR TD PT24
21.0000 mg | MEDICATED_PATCH | Freq: Every day | TRANSDERMAL | Status: DC
  Administered 2020-07-20 – 2020-07-21 (×2): 21 mg via TRANSDERMAL
  Filled 2020-07-20 (×2): qty 1

## 2020-07-20 MED ORDER — GADOBUTROL 1 MMOL/ML IV SOLN
7.0000 mL | Freq: Once | INTRAVENOUS | Status: AC | PRN
  Administered 2020-07-20: 7 mL via INTRAVENOUS
  Filled 2020-07-20: qty 7.5

## 2020-07-20 MED ORDER — SODIUM CHLORIDE 0.9% FLUSH: 3.0000 mL | INTRAVENOUS | Status: DC | PRN

## 2020-07-20 MED ORDER — ACETAMINOPHEN 325 MG PO TABS: 650.0000 mg | ORAL_TABLET | Freq: Four times a day (QID) | ORAL | Status: DC | PRN

## 2020-07-20 MED ORDER — AZITHROMYCIN 500 MG PO TABS
500.0000 mg | ORAL_TABLET | Freq: Once | ORAL | Status: AC
  Administered 2020-07-20: 500 mg via ORAL
  Filled 2020-07-20: qty 1

## 2020-07-20 MED ORDER — SODIUM CHLORIDE 0.9 % IV SOLN: INTRAVENOUS | Status: DC

## 2020-07-20 MED ORDER — ONDANSETRON HCL 4 MG/2ML IJ SOLN: 4.0000 mg | Freq: Four times a day (QID) | INTRAMUSCULAR | Status: DC | PRN

## 2020-07-20 NOTE — ED Notes (Signed)
See triage note  Presents with intermittent pain to left lateral rib /upper chest area for the past 3 months  Also has been having pain to right arm,elbow area for about 4 weeks   No injury

## 2020-07-20 NOTE — ED Notes (Signed)
Patient given dinner tray.

## 2020-07-20 NOTE — ED Provider Notes (Signed)
[facility] Emergency Department Provider Note  ____________________________________________   First MD Initiated Contact with Patient 07/20/20 1029     (approximate)  I have reviewed the triage vital signs and the nursing notes.   HISTORY  Chief Complaint Arm Pain    HPI Wesley Ferguson is a 54 y.o. male presents emergency department complaining of right arm pain around the elbow per day while, pain to the left ribs and a cough for a "long time.  Pain on the chest area has been for about 3 months.  Patient denies fever or chills.  Use the smoker smoking 2-1/2 to 3 packs/day.  Patient did come in last night but left without being seen.  He denies actual chest pain.    Past Medical History:  Diagnosis Date  . COPD (chronic obstructive pulmonary disease) [facility])     Patient Active Problem List   Diagnosis Date Noted  . 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 Nicotine dependence 07/20/2020  . Cavitating mass in left upper lung lobe 07/20/2020  . Hyponatremia 07/20/2020  . Postobstructive pneumonia 07/20/2020    Past Surgical History:  Procedure Laterality Date  . BACK SURGERY      Prior to Admission medications   Not on File    Allergies Patient has no known allergies.  No family history on file.  Social History Social History   Tobacco Use  . Smoking status: Current Every Day Smoker  . Smokeless tobacco: Never Used  Substance Use Topics  . Alcohol use: Yes  . Drug use: No    Review of Systems  Constitutional: No fever/chills Eyes: No visual changes. ENT: No sore throat. Respiratory: Positive cough Cardiovascular: Denies chest pain Gastrointestinal: Denies abdominal pain Genitourinary: Negative for dysuria. Musculoskeletal: Negative for back pain.  Positive for right arm and left rib pain Skin: Negative for rash. Psychiatric: no mood changes,     ____________________________________________   PHYSICAL  EXAM:  VITAL SIGNS: ED Triage Vitals  Enc Vitals Group     BP 07/20/20 0958 (!) 146/81     Pulse Rate 07/20/20 0958 87     Resp 07/20/20 0958 18     Temp 07/20/20 0958 98.3 F (36.8 C)     Temp Source 07/20/20 0958 Oral     SpO2 07/20/20 0958 98 %     Weight 07/20/20 0959 172 lb (78 kg)     Height 07/20/20 0959 6\' 2"  (1.88 m)     Head Circumference --      Peak Flow --      Pain Score 07/20/20 0959 8     Pain Loc --      Pain Edu? --      Excl. in [place]? --     Constitutional: Alert and oriented. Well appearing and in no acute distress. Eyes: Conjunctivae are normal.  Head: Atraumatic. Nose: No congestion/rhinnorhea. Mouth/Throat: Mucous membranes are moist.   Neck:  supple no lymphadenopathy noted Cardiovascular: Normal rate, regular rhythm. Heart sounds are normal Respiratory: Normal respiratory effort.  No retractions, lungs c t a  GU: deferred Musculoskeletal: FROM all extremities, warm and well perfused, right arm has swelling at the antecubital Neurologic:  Normal speech and language.  Skin:  Skin is warm, dry and intact. No rash noted. Psychiatric: Mood and affect are normal. Speech and behavior are normal.  ____________________________________________   LABS (all labs ordered are listed, but only abnormal results are displayed)  Labs Reviewed  CBC WITH DIFFERENTIAL/PLATELET - Abnormal; Notable for the following components:      Result Value   WBC 12.0 (*)    HCT 38.1 (*)    Platelets 414 (*)    Neutro Abs 8.7 (*)    Monocytes Absolute 1.2 (*)    All other components within normal limits  COMPREHENSIVE METABOLIC PANEL - Abnormal; Notable for the following components:   Sodium 129 (*)    Chloride 94 (*)    Glucose, Bld 104 (*)    All other components within normal limits  SARS CORONAVIRUS 2 BY RT PCR (HOSPITAL ORDER, [facility])  HIV ANTIBODY (ROUTINE TESTING W REFLEX)    ____________________________________________   ____________________________________________  RADIOLOGY  Chest x-ray shows an opacity with concerns of metastatic disease in the left lung CT of the chest with IV contrast Ultrasound of the right upper extremity due to the swelling below a previous blood draw.  ____________________________________________   PROCEDURES  Procedure(s) performed: No  Procedures    ____________________________________________   INITIAL IMPRESSION / ASSESSMENT AND PLAN / ED COURSE  Pertinent labs & imaging results that were available during my care of the patient were reviewed by me and considered in my medical decision making (see chart for details).   Patient is a 54 year old male presents emergency department with concerns of right arm pain and left rib pain.  See HPI.  Physical exam shows patient to be tender in the left ribs, antecubital area with some swelling noted.  Remainder exam is unremarkable  DDx: Covid, pneumonia, lung cancer, DVT of the right arm  CBC has elevated WBC of 12.0, troponin increased to 8.7, comprehensive metabolic panel has decreased sodium of 129, Covid test is negative  Chest x-ray shows concerns for pneumonia versus lung cancer  CT of the chest with contrast shows a necrotic lung mass with superimposing pneumonia. Ultrasound of the right upper extremity is negative for DVT   Findings to the patient.  Discussed with Dr. Charna Archer.  He spoke with Dr. Francine Graven for admission.  States patient will need oncology consult.  Everything was explained to the patient.  Hospitalist into discussed with patient.  He was admitted in stable condition.   As part of my medical decision making, I reviewed the following data within the [facility] notes reviewed and incorporated, Labs reviewed , Old chart reviewed, Radiograph reviewed , Discussed with admitting physician , Notes from prior ED visits and [place]  Controlled Substance Database  ____________________________________________   FINAL CLINICAL IMPRESSION(S) / ED DIAGNOSES  Final diagnoses:  Swelling  Community acquired pneumonia of left lung, unspecified part of lung  Lung mass  Liver mass      NEW MEDICATIONS STARTED DURING THIS VISIT:  New Prescriptions   No medications on file     Note:  This document was prepared using Dragon voice recognition software and may include unintentional dictation errors.    Versie Starks, PA-C 07/20/20 1418    Blake Divine, MD 07/20/20 [phone]

## 2020-07-20 NOTE — ED Notes (Addendum)
Pt unhooked from [employer]amap so that he could go to the bathroom. Pt is ambulatory with no assist and a steady gait. Pt denies needs at this time.

## 2020-07-20 NOTE — ED Triage Notes (Signed)
Pt c/o right elbow pain for the past couple of months, denies injury. Pt also c/o left lateral rib pain "feels like I broke my ribs".[name]

## 2020-07-20 NOTE — H&P (Signed)
History and Physical    Wesley Ferguson ZOX:[id number] DOB: 03-21-1966 DOA: 07/20/2020  PCP: System, Pcp Not In   Patient coming from: Home  I have personally briefly reviewed patient's old medical records in [place]  Chief Complaint: Left-sided chest pain  HPI: Wesley Ferguson is a 54 y.o. male with medical history significant for COPD and nicotine dependence who presents to the emergency room for evaluation of pain in the left lateral rib cage which he has had intermittently for the last 2 months but over the last 4 weeks the pain has become persistent.  He describes the pain as intense and states that he feels like he has rib fractures.   He denies having any trauma.  He also complains of pain to the right arm/elbow for about 4 weeks and denies any trauma as well. He presented to the emergency room due to persistence of his pain He was in the emergency 1 day prior to his admission but left without being seen. He also complains of a 63-month history of hemoptysis as well as shortness of breath which he said is worse when compared to his baseline.  He denies having any fever or chills. He denies having any abdominal pain, no nausea, no vomiting, no dizziness or lightheadedness. Labs reveal a sodium level of 129, white count of 12 with a left shift. Chest x-ray reviewed by me shows an opacity in the left upper lung.  Official report shows a cavitary left upper lung lesion. CT scan of the chest shows 1. 5.7 x 4.4 cm cavitating rounded abnormality is noted in the left upper lobe highly concerning for necrotic malignancy, or less likely cavitary pneumonia. Airspace opacity is noted in lingular segment of left upper lobe concerning for pneumonia. Subcarinal and left hilar adenopathy is noted concerning for metastatic disease, or possibly inflammatory in etiology. 3.6 x 2.5 cm ill-defined low density is noted peripherally in posterior segment of right hepatic lobe. This is concerning for  possible neoplasm or metastatic disease. MRI of the liver with and without gadolinium administration is recommended for further evaluation.    ED Course: Patient is a 54 year old male who comes into the ER for evaluation of left associated with hemoptysis.  He denies having any fever or chills..  Review of Systems: As per HPI otherwise 10 point review of systems negative.    Past Medical History:  Diagnosis Date  . COPD (chronic obstructive pulmonary disease) ([place])     Past Surgical History:  Procedure Laterality Date  . BACK SURGERY       reports that he has been smoking. He has never used smokeless tobacco. He reports current alcohol use. He reports that he does not use drugs.  No Known Allergies  No family history on file.   Prior to Admission medications   Medication Sig Start Date End Date Taking? Authorizing Provider  albuterol (PROVENTIL HFA;VENTOLIN HFA) 108 (90 BASE) MCG/ACT inhaler Inhale 2 puffs into the lungs as needed for wheezing or shortness of breath.    [provider]  albuterol (PROVENTIL HFA;VENTOLIN HFA) 108 (90 BASE) MCG/ACT inhaler Inhale 2 puffs into the lungs every 4 (four) hours as needed for wheezing or shortness of breath. 08/25/14   Orpah Greek, MD  aspirin EC 81 MG tablet Take 162 mg by mouth daily as needed (chest pain).    [provider]    Physical Exam: Vitals:   07/20/20 0958 07/20/20 0959  BP: (!) 146/81  Pulse: 87   Resp: 18   Temp: 98.3 F (36.8 C)   TempSrc: Oral   SpO2: 98%   Weight:  78 kg  Height:  6\' 2"  (1.88 m)     Vitals:   07/20/20 0958 07/20/20 0959  BP: (!) 146/81   Pulse: 87   Resp: 18   Temp: 98.3 F (36.8 C)   TempSrc: Oral   SpO2: 98%   Weight:  78 kg  Height:  6\' 2"  (1.88 m)    Constitutional: NAD, alert and oriented x 3 Eyes: PERRL, lids and conjunctivae normal ENMT: Mucous membranes are moist.  Neck: normal, supple, no masses, no thyromegaly Respiratory: Scattered  rhonchi left upper lobe, no wheezing, no crackles. Normal respiratory effort. No accessory muscle use.  Cardiovascular: Regular rate and rhythm, no murmurs / rubs / gallops. No extremity edema. 2+ pedal pulses. No carotid bruits.  Abdomen: no tenderness, no masses palpated. No hepatosplenomegaly. Bowel sounds positive.  Musculoskeletal: no clubbing / cyanosis. No joint deformity upper and lower extremities.  Skin: no rashes, lesions, ulcers.  Neurologic: No gross focal neurologic deficit. Psychiatric: Normal mood and affect.   Labs on Admission: I have personally reviewed following labs and imaging studies  CBC: Recent Labs  Lab 07/19/20 1925 07/20/20 1048  WBC 15.1* 12.0*  NEUTROABS  --  8.7*  HGB 12.1* 13.3  HCT 36.4* 38.1*  MCV 84.8 83.4  PLT 428* 502*   Basic Metabolic Panel: Recent Labs  Lab 07/19/20 1925 07/20/20 1048  NA 125* 129*  K 3.7 4.7  CL 90* 94*  CO2 22 27  GLUCOSE 88 104*  BUN <5* 9  CREATININE 0.64 0.70  CALCIUM 9.2 9.1   GFR: Estimated Creatinine Clearance: 119.2 mL/min (by C-G formula based on SCr of 0.7 mg/dL). Liver Function Tests: Recent Labs  Lab 07/20/20 1048  AST 16  ALT 13  ALKPHOS 81  BILITOT 0.6  PROT 8.1  ALBUMIN 3.9   No results for input(s): LIPASE, AMYLASE in the last 168 hours. No results for input(s): AMMONIA in the last 168 hours. Coagulation Profile: No results for input(s): INR, PROTIME in the last 168 hours. Cardiac Enzymes: No results for input(s): CKTOTAL, CKMB, CKMBINDEX, TROPONINI in the last 168 hours. BNP (last 3 results) No results for input(s): PROBNP in the last 8760 hours. HbA1C: No results for input(s): HGBA1C in the last 72 hours. CBG: No results for input(s): GLUCAP in the last 168 hours. Lipid Profile: No results for input(s): CHOL, HDL, LDLCALC, TRIG, CHOLHDL, LDLDIRECT in the last 72 hours. Thyroid Function Tests: No results for input(s): TSH, T4TOTAL, FREET4, T3FREE, THYROIDAB in the last 72  hours. Anemia Panel: No results for input(s): VITAMINB12, FOLATE, FERRITIN, TIBC, IRON, RETICCTPCT in the last 72 hours. Urine analysis: No results found for: COLORURINE, APPEARANCEUR, LABSPEC, [place], GLUCOSEU, HGBUR, BILIRUBINUR, KETONESUR, PROTEINUR, UROBILINOGEN, NITRITE, LEUKOCYTESUR  Radiological Exams on Admission: DG Chest 2 View  Addendum Date: 07/20/2020   ADDENDUM REPORT: 07/20/2020 11:02 ADDENDUM: Upper lung opacity appears cavitary. Electronically Signed   By: Macy Mis M.D.   On: 07/20/2020 11:02   Result Date: 07/20/2020 CLINICAL DATA:  Cough, left rib pain EXAM: CHEST - 2 VIEW COMPARISON:  2015 FINDINGS: Patchy left lung opacities. Background changes of COPD. No pleural effusion or pneumothorax. Stable cardiomediastinal contours with normal heart size. No acute osseous abnormality. IMPRESSION: Patchy left lung opacities, which may reflect pneumonia in the appropriate setting. Follow-up is recommended after treatment. Consider chest CT if there is  concern for malignancy. Electronically Signed: By: Macy Mis M.D. On: 07/20/2020 10:25   CT Chest W Contrast  Result Date: 07/20/2020 CLINICAL DATA:  Chest pain, hemoptysis. EXAM: CT CHEST WITH CONTRAST TECHNIQUE: Multidetector CT imaging of the chest was performed during intravenous contrast administration. CONTRAST:  74mL OMNIPAQUE IOHEXOL 300 MG/ML  SOLN COMPARISON:  Radiograph of same day. FINDINGS: Cardiovascular: No significant vascular findings. Normal heart size. No pericardial effusion. Mediastinum/Nodes: Thyroid gland and esophagus are unremarkable. Subcarinal adenopathy is noted with the largest lymph node measuring 12 mm. Left hilar adenopathy is noted measuring 2.2 cm. Lungs/Pleura: No pneumothorax or pleural effusion is noted. Mild emphysematous disease is noted throughout both lungs. 5.7 x 4.4 cm cavitating rounded abnormality is noted in the left upper lobe highly concerning for necrotic malignancy, or less likely  cavitary pneumonia. Airspace opacity is noted in lingular segment of left upper lobe concerning for pneumonia. Upper Abdomen: 3.6 x 2.5 cm ill-defined low density is noted peripherally in posterior segment of right hepatic lobe. Musculoskeletal: No chest wall abnormality. No acute or significant osseous findings. IMPRESSION: 1. 5.7 x 4.4 cm cavitating rounded abnormality is noted in the left upper lobe highly concerning for necrotic malignancy, or less likely cavitary pneumonia. 2. Airspace opacity is noted in lingular segment of left upper lobe concerning for pneumonia. 3. Subcarinal and left hilar adenopathy is noted concerning for metastatic disease, or possibly inflammatory in etiology. 4. 3.6 x 2.5 cm ill-defined low density is noted peripherally in posterior segment of right hepatic lobe. This is concerning for possible neoplasm or metastatic disease. MRI of the liver with and without gadolinium administration is recommended for further evaluation. Emphysema (ICD10-J43.9). Electronically Signed   By: Marijo Conception M.D.   On: 07/20/2020 11:11   US Venous Img Upper Uni Right(DVT)  Result Date: 07/20/2020 CLINICAL DATA:  Right upper extremity swelling EXAM: RIGHT UPPER EXTREMITY VENOUS DOPPLER ULTRASOUND TECHNIQUE: Gray-scale sonography with graded compression, as well as color Doppler and duplex ultrasound were performed to evaluate the upper extremity deep venous system from the level of the subclavian vein and including the jugular, axillary, basilic, radial, ulnar and upper cephalic vein. Spectral Doppler was utilized to evaluate flow at rest and with distal augmentation maneuvers. COMPARISON:  None. FINDINGS: Contralateral Subclavian Vein: Respiratory phasicity is normal and symmetric with the symptomatic side. No evidence of thrombus. Normal compressibility. Internal Jugular Vein: No evidence of thrombus. Normal compressibility, respiratory phasicity and response to augmentation. Subclavian Vein: No  evidence of thrombus. Normal compressibility, respiratory phasicity and response to augmentation. Axillary Vein: No evidence of thrombus. Normal compressibility, respiratory phasicity and response to augmentation. Cephalic Vein: No evidence of thrombus. Normal compressibility, respiratory phasicity and response to augmentation. Basilic Vein: No evidence of thrombus. Normal compressibility, respiratory phasicity and response to augmentation. Brachial Veins: No evidence of thrombus. Normal compressibility, respiratory phasicity and response to augmentation. Radial Veins: No evidence of thrombus. Normal compressibility, respiratory phasicity and response to augmentation. Ulnar Veins: No evidence of thrombus. Normal compressibility, respiratory phasicity and response to augmentation. Venous Reflux:  None visualized. Other Findings:  None visualized. IMPRESSION: No evidence of DVT within the right upper extremity. Electronically Signed   By: Rolm Baptise M.D.   On: 07/20/2020 12:01    EKG: Independently reviewed.   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 Secondary to lung malignancy Gentle IV fluid hydration with saline Repeat sodium levels in a.m.   Nicotine dependence Smoking cessation has been discussed with patient in detail He smokes about 2 to 3 packs of cigarettes daily We will place on a nicotine transdermal patch 21 mg daily  DVT prophylaxis: SCD Code Status: Full code Family Communication: Greater than 50% of time was spent discussing plan of care with patient at the bedside. He  verbalizes understanding and agrees with the plan. Disposition Plan: Back to previous home environment Consults called: Pulmonary    Kora Groom MD [facility]     07/20/2020, 12:56 PM

## 2020-07-21 DIAGNOSIS — J984 Other disorders of lung: Secondary | ICD-10-CM

## 2020-07-21 LAB — CBC
HCT: 35.5 % — ABNORMAL LOW (ref 39.0–52.0)
Hemoglobin: 12.1 g/dL — ABNORMAL LOW (ref 13.0–17.0)
MCH: 28.8 pg (ref 26.0–34.0)
MCHC: 34.1 g/dL (ref 30.0–36.0)
MCV: 84.5 fL (ref 80.0–100.0)
Platelets: 402 10*3/uL — ABNORMAL HIGH (ref 150–400)
RBC: 4.2 MIL/uL — ABNORMAL LOW (ref 4.22–5.81)
RDW: 13.6 % (ref 11.5–15.5)
WBC: 10 10*3/uL (ref 4.0–10.5)
nRBC: 0 % (ref 0.0–0.2)

## 2020-07-21 LAB — BASIC METABOLIC PANEL
Anion gap: 4 — ABNORMAL LOW (ref 5–15)
BUN: 7 mg/dL (ref 6–20)
CO2: 28 mmol/L (ref 22–32)
Calcium: 9 mg/dL (ref 8.9–10.3)
Chloride: 102 mmol/L (ref 98–111)
Creatinine, Ser: 0.72 mg/dL (ref 0.61–1.24)
GFR calc Af Amer: >60 mL/min (ref >60)
GFR calc non Af Amer: >60 mL/min (ref >60)
Glucose, Bld: 94 mg/dL (ref 70–99)
Potassium: 4.6 mmol/L (ref 3.5–5.1)
Sodium: 134 mmol/L — ABNORMAL LOW (ref 135–145)

## 2020-07-21 LAB — HIV ANTIBODY (ROUTINE TESTING W REFLEX): HIV Screen 4th Generation wRfx: NONREACTIVE

## 2020-07-21 MED ORDER — LEVOFLOXACIN 750 MG PO TABS: 750.0000 mg | ORAL_TABLET | Freq: Every day | ORAL | 0 refills | Status: AC

## 2020-07-21 NOTE — Plan of Care (Signed)

## 2020-07-21 NOTE — Consult Note (Signed)
Reason for Consult: Lung mass Referring Physician: Barb Merino, MD  Wesley Ferguson is an 54 y.o. male.  HPI: Wesley Ferguson is a 54 year old male, current smoker (2 to 3 packs/day), who presented to [facility] yesterday for evaluation of left-sided chest pain mostly along the left lateral rib cage.  He is quite a challenging historian.  Patient states that the pain died several days prior to admission.  Approximately 1 month ago he had streaky hemoptysis, he has not had hemoptysis since then.  He has had some dyspnea over baseline.  He has not had any fevers, chills or sweats.  No weight loss or change in appetite.  He had initially a chest x-ray that showed a left upper lung zone cavitary density.  Chest CT also showed a lingular pneumonia.  We are asked to render opinion as to the diagnosis and evaluation of the lung mass.  Patient has not had any orthopnea or paroxysmal nocturnal dyspnea.  As noted he has had cough productive mostly of whitish to grayish sputum though lately some green sputum noted.  Cough is mostly in the mornings.  He voices no other complaints.  Past Medical History:  Diagnosis Date  . COPD (chronic obstructive pulmonary disease) ([place])     Past Surgical History:  Procedure Laterality Date  . BACK SURGERY      No family history on file.  Patient does not endorse any significant family history.  Social History:  reports that he has been smoking. He has never used smokeless tobacco. He reports current alcohol use. He reports that he does not use drugs.  Allergies: No Known Allergies  Medications:  I have reviewed the patient's current medications. Prior to Admission:  No medications prior to admission.    Results for orders placed or performed during the hospital encounter of 07/20/20 (from the past 48 hour(s))  CBC with Differential     Status: Abnormal   Collection Time: 07/20/20 10:48 AM  Result Value Ref Range   WBC 12.0 (H) 4.0 - 10.5 K/uL   RBC 4.57 4.22 - 5.81  MIL/uL   Hemoglobin 13.3 13.0 - 17.0 g/dL   HCT 38.1 (L) 39 - 52 %   MCV 83.4 80.0 - 100.0 fL   MCH 29.1 26.0 - 34.0 pg   MCHC 34.9 30.0 - 36.0 g/dL   RDW 13.5 11.5 - 15.5 %   Platelets 414 (H) 150 - 400 K/uL   nRBC 0.0 0.0 - 0.2 %   Neutrophils Relative % 72 %   Neutro Abs 8.7 (H) 1.7 - 7.7 K/uL   Lymphocytes Relative 15 %   Lymphs Abs 1.7 0.7 - 4.0 K/uL   Monocytes Relative 10 %   Monocytes Absolute 1.2 (H) 0 - 1 K/uL   Eosinophils Relative 1 %   Eosinophils Absolute 0.1 0 - 0 K/uL   Basophils Relative 1 %   Basophils Absolute 0.1 0 - 0 K/uL   Immature Granulocytes 1 %   Abs Immature Granulocytes 0.06 0.00 - 0.07 K/uL    Comment: Performed at [facility], [address] [place], [place] [address]  Comprehensive metabolic panel     Status: Abnormal   Collection Time: 07/20/20 10:48 AM  Result Value Ref Range   Sodium 129 (L) 135 - 145 mmol/L   Potassium 4.7 3.5 - 5.1 mmol/L   Chloride 94 (L) 98 - 111 mmol/L   CO2 27 22 - 32 mmol/L   Glucose, Bld 104 (H) 70 - 99  mg/dL    Comment: Glucose reference range applies only to samples taken after fasting for at least 8 hours.   BUN 9 6 - 20 mg/dL   Creatinine, Ser 0.70 0.61 - 1.24 mg/dL   Calcium 9.1 8.9 - 10.3 mg/dL   Total Protein 8.1 6.5 - 8.1 g/dL   Albumin 3.9 3.5 - 5.0 g/dL   AST 16 15 - 41 U/L   ALT 13 0 - 44 U/L   Alkaline Phosphatase 81 38 - 126 U/L   Total Bilirubin 0.6 0.3 - 1.2 mg/dL   GFR calc non Af Amer >60 >60 mL/min   GFR calc Af Amer >60 >60 mL/min   Anion gap 8 5 - 15    Comment: Performed at [facility], [address] [place], [place] [address]  SARS Coronavirus 2 by RT PCR (hospital order, performed in [facility] hospital lab) Nasopharyngeal Nasopharyngeal Swab     Status: None   Collection Time: 07/20/20 12:01 PM   Specimen: Nasopharyngeal Swab  Result Value Ref Range   SARS Coronavirus 2 NEGATIVE NEGATIVE    Comment: (NOTE) SARS-CoV-2 target nucleic acids are NOT  DETECTED.  The SARS-CoV-2 RNA is generally detectable in upper and lower respiratory specimens during the acute phase of infection. The lowest concentration of SARS-CoV-2 viral copies this assay can detect is 250 copies / mL. A negative result does not preclude SARS-CoV-2 infection and should not be used as the sole basis for treatment or other patient management decisions.  A negative result may occur with improper specimen collection / handling, submission of specimen other than nasopharyngeal swab, presence of viral mutation(s) within the areas targeted by this assay, and inadequate number of viral copies (<250 copies / mL). A negative result must be combined with clinical observations, patient history, and epidemiological information.  Fact Sheet for Patients:   StrictlyIdeas.no  Fact Sheet for Healthcare Providers: BankingDealers.co.za  This test is not yet approved or  cleared by the [place] FDA and has been authorized for detection and/or diagnosis of SARS-CoV-2 by FDA under an Emergency Use Authorization (EUA).  This EUA will remain in effect (meaning this test can be used) for the duration of the COVID-19 declaration under Section 564(b)(1) of the Act, 21 U.S.C. section 360bbb-3(b)(1), unless the authorization is terminated or revoked sooner.  Performed at [facility], [address] [place], [place] [address]   HIV Antibody (routine testing w rflx)     Status: None   Collection Time: 07/21/20  5:04 AM  Result Value Ref Range   HIV Screen 4th Generation wRfx Non Reactive Non Reactive    Comment: Performed at [facility], [place] [address] [place], [place] [address]  Basic metabolic panel     Status: Abnormal   Collection Time: 07/21/20  5:04 AM  Result Value Ref Range   Sodium 134 (L) 135 - 145 mmol/L   Potassium 4.6 3.5 - 5.1 mmol/L   Chloride 102 98 - 111 mmol/L   CO2 28 22 - 32 mmol/L    Glucose, Bld 94 70 - 99 mg/dL    Comment: Glucose reference range applies only to samples taken after fasting for at least 8 hours.   BUN 7 6 - 20 mg/dL   Creatinine, Ser 0.72 0.61 - 1.24 mg/dL   Calcium 9.0 8.9 - 10.3 mg/dL   GFR calc non Af Amer >60 >60 mL/min   GFR calc Af Amer >60 >60 mL/min   Anion gap  4 (L) 5 - 15    Comment: Performed at [facility], [address] [place], [place] [address]  CBC     Status: Abnormal   Collection Time: 07/21/20  5:04 AM  Result Value Ref Range   WBC 10.0 4.0 - 10.5 K/uL   RBC 4.20 (L) 4.22 - 5.81 MIL/uL   Hemoglobin 12.1 (L) 13.0 - 17.0 g/dL   HCT 35.5 (L) 39 - 52 %   MCV 84.5 80.0 - 100.0 fL   MCH 28.8 26.0 - 34.0 pg   MCHC 34.1 30.0 - 36.0 g/dL   RDW 13.6 11.5 - 15.5 %   Platelets 402 (H) 150 - 400 K/uL   nRBC 0.0 0.0 - 0.2 %    Comment: Performed at [facility], [address] [place], [place] [address]    DG Chest 2 View  Addendum Date: 07/20/2020   ADDENDUM REPORT: 07/20/2020 11:02 ADDENDUM: Upper lung opacity appears cavitary. Electronically Signed   By: Macy Mis M.D.   On: 07/20/2020 11:02   Result Date: 07/20/2020 CLINICAL DATA:  Cough, left rib pain EXAM: CHEST - 2 VIEW COMPARISON:  2015 FINDINGS: Patchy left lung opacities. Background changes of COPD. No pleural effusion or pneumothorax. Stable cardiomediastinal contours with normal heart size. No acute osseous abnormality. IMPRESSION: Patchy left lung opacities, which may reflect pneumonia in the appropriate setting. Follow-up is recommended after treatment. Consider chest CT if there is concern for malignancy. Electronically Signed: By: Macy Mis M.D. On: 07/20/2020 10:25   CT Chest W Contrast  Result Date: 07/20/2020 CLINICAL DATA:  Chest pain, hemoptysis. EXAM: CT CHEST WITH CONTRAST TECHNIQUE: Multidetector CT imaging of the chest was performed during intravenous contrast administration. CONTRAST:  46mL OMNIPAQUE IOHEXOL 300 MG/ML  SOLN  COMPARISON:  Radiograph of same day. FINDINGS: Cardiovascular: No significant vascular findings. Normal heart size. No pericardial effusion. Mediastinum/Nodes: Thyroid gland and esophagus are unremarkable. Subcarinal adenopathy is noted with the largest lymph node measuring 12 mm. Left hilar adenopathy is noted measuring 2.2 cm. Lungs/Pleura: No pneumothorax or pleural effusion is noted. Mild emphysematous disease is noted throughout both lungs. 5.7 x 4.4 cm cavitating rounded abnormality is noted in the left upper lobe highly concerning for necrotic malignancy, or less likely cavitary pneumonia. Airspace opacity is noted in lingular segment of left upper lobe concerning for pneumonia. Upper Abdomen: 3.6 x 2.5 cm ill-defined low density is noted peripherally in posterior segment of right hepatic lobe. Musculoskeletal: No chest wall abnormality. No acute or significant osseous findings. IMPRESSION: 1. 5.7 x 4.4 cm cavitating rounded abnormality is noted in the left upper lobe highly concerning for necrotic malignancy, or less likely cavitary pneumonia. 2. Airspace opacity is noted in lingular segment of left upper lobe concerning for pneumonia. 3. Subcarinal and left hilar adenopathy is noted concerning for metastatic disease, or possibly inflammatory in etiology. 4. 3.6 x 2.5 cm ill-defined low density is noted peripherally in posterior segment of right hepatic lobe. This is concerning for possible neoplasm or metastatic disease. MRI of the liver with and without gadolinium administration is recommended for further evaluation. Emphysema (ICD10-J43.9). Electronically Signed   By: Marijo Conception M.D.   On: 07/20/2020 11:11   MR LIVER W WO CONTRAST  Result Date: 07/20/2020 CLINICAL DATA:  Evaluate liver lesions seen on recent CT scan. Known large cavitary left upper lobe lung mass and left hilar adenopathy. EXAM: MRI ABDOMEN WITHOUT AND WITH CONTRAST TECHNIQUE: Multiplanar multisequence MR imaging of the abdomen  was performed both before and after the administration of intravenous contrast. CONTRAST:  60mL GADAVIST GADOBUTROL 1 MMOL/ML IV SOLN COMPARISON:  CT scan 06/20/2020 FINDINGS: Lower chest: The lingular atelectasis is noted. No lower pulmonary lesions are identified. No pleural or pericardial effusion. Borderline enlarged epicardial lymph nodes are noted. Hepatobiliary: There are 5 liver lesions. 14 mm lesion in segment 2, 14 mm lesion in segment 3, 13.5 mm lesion in segment 8 and 2 adjacent lesions in segment 6. The larger lesion measures 17 mm in the smaller lesion 11.5 mm. All of these lesions demonstrate high T2 signal intensity, peripheral nodular enhancement and complete or near complete filling in on delayed images. The lesions have the same signal intensity as the blood pool on the delayed images. These are consistent with benign hepatic hemangiomas. I do not see any worrisome hepatic lesions to suggest hepatic metastatic disease. The gallbladder is unremarkable. No intra or extrahepatic biliary dilatation. Pancreas:  Normal. Spleen:  Normal size.  No focal lesions. Adrenals/Urinary Tract: The adrenal glands are normal. No adrenal gland metastasis are identified. Both kidneys are normal. Stomach/Bowel: The stomach, duodenum, visualized small bowel and visualized colon are unremarkable. No acute inflammatory changes or mass lesions. No obstructive findings. Vascular/Lymphatic: The aorta and branch vessels are normal. The major venous structures are patent. No mesenteric or retroperitoneal mass or adenopathy. Other:  No ascites or abdominal wall hernia. Musculoskeletal: No significant bony findings. Small hemangioma noted in L1. IMPRESSION: 1. 5 liver lesions are consistent with benign hepatic hemangiomas. No worrisome hepatic lesions to suggest hepatic metastatic disease. 2. No acute abdominal findings or adenopathy. 3. Borderline enlarged epicardial lymph nodes are noted. Electronically Signed   By: Marijo Sanes M.D.   On: 07/20/2020 13:54   US Venous Img Upper Uni Right(DVT)  Result Date: 07/20/2020 CLINICAL DATA:  Right upper extremity swelling EXAM: RIGHT UPPER EXTREMITY VENOUS DOPPLER ULTRASOUND TECHNIQUE: Gray-scale sonography with graded compression, as well as color Doppler and duplex ultrasound were performed to evaluate the upper extremity deep venous system from the level of the subclavian vein and including the jugular, axillary, basilic, radial, ulnar and upper cephalic vein. Spectral Doppler was utilized to evaluate flow at rest and with distal augmentation maneuvers. COMPARISON:  None. FINDINGS: Contralateral Subclavian Vein: Respiratory phasicity is normal and symmetric with the symptomatic side. No evidence of thrombus. Normal compressibility. Internal Jugular Vein: No evidence of thrombus. Normal compressibility, respiratory phasicity and response to augmentation. Subclavian Vein: No evidence of thrombus. Normal compressibility, respiratory phasicity and response to augmentation. Axillary Vein: No evidence of thrombus. Normal compressibility, respiratory phasicity and response to augmentation. Cephalic Vein: No evidence of thrombus. Normal compressibility, respiratory phasicity and response to augmentation. Basilic Vein: No evidence of thrombus. Normal compressibility, respiratory phasicity and response to augmentation. Brachial Veins: No evidence of thrombus. Normal compressibility, respiratory phasicity and response to augmentation. Radial Veins: No evidence of thrombus. Normal compressibility, respiratory phasicity and response to augmentation. Ulnar Veins: No evidence of thrombus. Normal compressibility, respiratory phasicity and response to augmentation. Venous Reflux:  None visualized. Other Findings:  None visualized. IMPRESSION: No evidence of DVT within the right upper extremity. Electronically Signed   By: Rolm Baptise M.D.   On: 07/20/2020 12:01    Review of Systems  A 10 point  review of systems was performed and it is as noted above otherwise negative.  Blood pressure 123/79, pulse 62, temperature 98.2 F (36.8 C), resp. rate 17, height 6\' 2"  (1.88 m), weight 78 kg, SpO2  99 %. Physical Exam  GENERAL: Well-developed well-nourished gentleman, no acute distress.  Intermittently coughing. HEAD: Normocephalic, atraumatic.  EYES: Pupils equal, round, reactive to light.  No scleral icterus.  MOUTH: Dentures upper and lower.  Mallampati class II.  No thrush. NECK: Supple. No thyromegaly. Trachea midline. No JVD.  No adenopathy. PULMONARY: Good air entry bilaterally.  Coarse breath sounds with no other adventitious sounds.  CARDIOVASCULAR: S1 and S2. Regular rate and rhythm.  No rubs, murmurs or gallops heard. GASTROINTESTINAL: Benign. MUSCULOSKELETAL: No joint deformity, no clubbing, no edema.  NEUROLOGIC: No focal deficit, fluent speech.  Appears to have mild memory impairment. SKIN: Intact,warm,dry.  Limited exam skin, no rashes PSYCH: Mood and behavior appear normal.   Chest x-ray performed 7/26:     CT chest performed 7/26, representative slice.   Assessment/Plan:    ICD-10-CM   1. Community acquired pneumonia of left lung, unspecified part of lung  J18.9   2. Swelling  R60.9 US Venous Img Upper Uni Right(DVT)    US Venous Img Upper Uni Right(DVT)  3. Lung mass  R91.8   4. 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  Patient will need bronchoscopy with navigational assistance for biopsy.  This has to be done under general anesthesia.  Recommend treating his community-acquired pneumonia.  May switch to p.o. antibiotics.  We can see the patient back in the office next week to discuss bronchoscopic procedure and schedule for biopsy.  The patient to these instructions.  Patient has been scheduled for 4 August at 3 PM at [facility].  I also spoke to the patient's daughter, [name], at his  request and explained all the above.  Patient has my calling card.  Community-acquired pneumonia, lingula subsegment, LEFT lung: No organism specified, agree with levofloxacin p.o.  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  Tobacco dependence due to cigarettes: Patient was counseled regards to discontinuation of smoking.   Discussion:  Patient currently has community-acquired pneumonia.  I do not believe that this is a postobstructive pneumonia as it is on a different subsegment/area on the left lung.  Nevertheless he needs diagnosis of his left upper lobe cavitary lesion.  We will see the patient in follow-up in a week's time and at that time reassess him after antibiotic therapy.  He will then be scheduled for bronchoscopy with navigational assistance under general anesthesia.  Thank you for allowing me to participate in this gentleman's care.  I conveyed all of the above to Barb Merino, MD via secure chat.    Renold Don, MD [facility]   *This note was dictated using voice recognition software/Dragon.  Despite best efforts to proofread, errors can occur which can change the meaning.  Any change was purely unintentional.   07/21/2020, 1:38 PM

## 2020-07-21 NOTE — TOC Initial Note (Signed)
Transition of Care [facility]) - Initial/Assessment Note    Patient Details  Name: Wesley Ferguson MRN: [id number] Date of Birth: [birth year]  Transition of Care [facility] CM/SW Contact:    Wesley Hutching, RN Phone Number: 07/21/2020, 11:10 AM  Clinical Narrative:                 Patient admitted to the hospital with Cavitating lung mass of the left lung.  Patient is from home where he lives with his mother, Wesley Ferguson.  Patient's daughter, Wesley Ferguson, is at the bedside, patient gives permission for Wesley Ferguson team to speak with both his daughter and mother.   Patient is independent and works at [employer] 2-3 days per week for about 4 hours at the time.  Patient has a vehicle and drives.  Patient does not have insurance or a primary care doctor.   This RNCM has provided patient with the application for Medication Management and Wesley Ferguson.  Patient has also been provided with a list of [facility] including free and low cost healthcare options.   RNCM will cont to follow and assist with any additional discharge needs.   Expected Discharge Plan: Home/Self Care Barriers to Discharge: Continued Medical Work up   Patient Goals and CMS Choice        Expected Discharge Plan and Services Expected Discharge Plan: Home/Self Care   Discharge Planning Services: CM Consult, Medication Assistance, Wesley Ferguson   Living arrangements for the past 2 months: Single Family Home                   DME Agency: NA       HH Arranged: NA          Prior Living Arrangements/Services Living arrangements for the past 2 months: Single Family Home Lives with:: Parents Patient language and need for interpreter reviewed:: Yes Do you feel safe going back to the place where you live?: Yes      Need for Family Participation in Patient Care: Yes (Comment) (new lung lesion) Care giver support system in place?: Yes (comment) (daughter and mother)   Criminal Activity/Legal Involvement Pertinent to  Current Situation/Hospitalization: No - Comment as needed  Activities of Daily Living Home Assistive Devices/Equipment: None ADL Screening (condition at time of admission) Patient's cognitive ability adequate to safely complete daily activities?: Yes Is the patient deaf or have difficulty hearing?: No Does the patient have difficulty seeing, even when wearing glasses/contacts?: No Does the patient have difficulty concentrating, remembering, or making decisions?: No Patient able to express need for assistance with ADLs?: Yes Does the patient have difficulty dressing or bathing?: No Independently performs ADLs?: Yes (appropriate for developmental age) Does the patient have difficulty walking or climbing stairs?: No Weakness of Legs: None Weakness of Arms/Hands: None  Permission Sought/Granted Permission sought to share information with : Case Manager, Family Supports Permission granted to share information with : Yes, Verbal Permission Granted  Share Information with NAME: Wesley Ferguson and [place] granted to share info w Relationship: daughter and mother     Emotional Assessment Appearance:: Appears stated age Attitude/Demeanor/Rapport: Engaged Affect (typically observed): Accepting Orientation: : Oriented to Self, Oriented to Place, Oriented to  Time, Oriented to Situation Alcohol / Substance Use: Not Applicable Psych Involvement: No (comment)  Admission diagnosis:  Swelling [R60.9] Lung mass [R91.8] Liver mass [R16.0] Cavitating mass in left upper lung lobe [J98.4] Cavitating mass of upper lobe of left lung [J98.4] Community acquired pneumonia  of left lung, unspecified part of lung [J18.9] Patient Active Problem List   Diagnosis Date Noted  . 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 Nicotine dependence 07/20/2020  . Cavitating mass in left upper lung lobe 07/20/2020  . Hyponatremia 07/20/2020  . Postobstructive pneumonia 07/20/2020   PCP:  System,  Pcp Not In Pharmacy:   [id number] - [place], [place] - [address] [place] [address] Phone: [phone] Fax: [place], [place] - [address] [place] [address] Phone: [phone] Fax: [phone]     Social Determinants of Health (SDOH) Interventions    Readmission Risk Interventions No flowsheet data found.

## 2020-07-21 NOTE — Discharge Summary (Signed)
Physician Discharge Summary  TERIK HAUGHEY FXT:[id number] DOB: 07/30/1966 DOA: 07/20/2020  PCP: System, Pcp Not In  Admit date: 07/20/2020 Discharge date: 07/21/2020  Admitted From: Home Disposition: Home  Recommendations for Outpatient Follow-up:  1. Follow-up with pulmonology as a scheduled on 07/29/2020  Discharge Condition: Stable CODE STATUS: Full code Diet recommendation: Regular diet  Discharge summary: 54 year old gentleman with no medical issues, heavy smoker presented to emergency room with left lateral rib cage pain for 2 months and persistent for 1 month.  Has history of intermittent hemoptysis.  In the emergency room, sodium 129, WC count 12.  Chest x-ray showed left upper lung opacity.  CT scan of the chest showed 1.5 x 4.4 cm cavitating rounded abnormality left upper lobe.  Subcarinal and left hilar adenopathy.  Ill-defined low-density on the right hepatic lobe.  MRI of the liver showed benign hemangioma, no concerning metastatic disease.  Patient possibly has cavitating mass on the left upper lung, currently hemodynamically stable.  Patient is on room air and ambulating around.  Seen by pulmonary and plan for 1 week of antibiotic treatment, follow-up next week 8/4 with subsequent bronchoscopy and biopsy plan.  Patient is discharged home with Levaquin 750 mg daily for 7 days.  He is motivated to quit his smoking and will use nicotine patch.  Discharge Diagnoses:  Principal Problem:   Cavitating mass of upper lobe of left lung Active Problems:   Nicotine dependence   Cavitating mass in left upper lung lobe   Hyponatremia   Postobstructive pneumonia    Discharge Instructions  Discharge Instructions    Diet general   Complete by: As directed    Increase activity slowly   Complete by: As directed      Allergies as of 07/21/2020   No Known Allergies     Medication List    TAKE these medications   levofloxacin 750 MG tablet Commonly known as: Levaquin Take 1  tablet (750 mg total) by mouth daily for 7 days.       No Known Allergies  Consultations:  Pulmonary   Procedures/Studies: DG Chest 2 View  Addendum Date: 07/20/2020   ADDENDUM REPORT: 07/20/2020 11:02 ADDENDUM: Upper lung opacity appears cavitary. Electronically Signed   By: Macy Mis M.D.   On: 07/20/2020 11:02   Result Date: 07/20/2020 CLINICAL DATA:  Cough, left rib pain EXAM: CHEST - 2 VIEW COMPARISON:  2015 FINDINGS: Patchy left lung opacities. Background changes of COPD. No pleural effusion or pneumothorax. Stable cardiomediastinal contours with normal heart size. No acute osseous abnormality. IMPRESSION: Patchy left lung opacities, which may reflect pneumonia in the appropriate setting. Follow-up is recommended after treatment. Consider chest CT if there is concern for malignancy. Electronically Signed: By: Macy Mis M.D. On: 07/20/2020 10:25   CT Chest W Contrast  Result Date: 07/20/2020 CLINICAL DATA:  Chest pain, hemoptysis. EXAM: CT CHEST WITH CONTRAST TECHNIQUE: Multidetector CT imaging of the chest was performed during intravenous contrast administration. CONTRAST:  6mL OMNIPAQUE IOHEXOL 300 MG/ML  SOLN COMPARISON:  Radiograph of same day. FINDINGS: Cardiovascular: No significant vascular findings. Normal heart size. No pericardial effusion. Mediastinum/Nodes: Thyroid gland and esophagus are unremarkable. Subcarinal adenopathy is noted with the largest lymph node measuring 12 mm. Left hilar adenopathy is noted measuring 2.2 cm. Lungs/Pleura: No pneumothorax or pleural effusion is noted. Mild emphysematous disease is noted throughout both lungs. 5.7 x 4.4 cm cavitating rounded abnormality is noted in the left upper lobe highly concerning for necrotic malignancy, or less  likely cavitary pneumonia. Airspace opacity is noted in lingular segment of left upper lobe concerning for pneumonia. Upper Abdomen: 3.6 x 2.5 cm ill-defined low density is noted peripherally in posterior  segment of right hepatic lobe. Musculoskeletal: No chest wall abnormality. No acute or significant osseous findings. IMPRESSION: 1. 5.7 x 4.4 cm cavitating rounded abnormality is noted in the left upper lobe highly concerning for necrotic malignancy, or less likely cavitary pneumonia. 2. Airspace opacity is noted in lingular segment of left upper lobe concerning for pneumonia. 3. Subcarinal and left hilar adenopathy is noted concerning for metastatic disease, or possibly inflammatory in etiology. 4. 3.6 x 2.5 cm ill-defined low density is noted peripherally in posterior segment of right hepatic lobe. This is concerning for possible neoplasm or metastatic disease. MRI of the liver with and without gadolinium administration is recommended for further evaluation. Emphysema (ICD10-J43.9). Electronically Signed   By: Marijo Conception M.D.   On: 07/20/2020 11:11   MR LIVER W WO CONTRAST  Result Date: 07/20/2020 CLINICAL DATA:  Evaluate liver lesions seen on recent CT scan. Known large cavitary left upper lobe lung mass and left hilar adenopathy. EXAM: MRI ABDOMEN WITHOUT AND WITH CONTRAST TECHNIQUE: Multiplanar multisequence MR imaging of the abdomen was performed both before and after the administration of intravenous contrast. CONTRAST:  81mL GADAVIST GADOBUTROL 1 MMOL/ML IV SOLN COMPARISON:  CT scan 06/20/2020 FINDINGS: Lower chest: The lingular atelectasis is noted. No lower pulmonary lesions are identified. No pleural or pericardial effusion. Borderline enlarged epicardial lymph nodes are noted. Hepatobiliary: There are 5 liver lesions. 14 mm lesion in segment 2, 14 mm lesion in segment 3, 13.5 mm lesion in segment 8 and 2 adjacent lesions in segment 6. The larger lesion measures 17 mm in the smaller lesion 11.5 mm. All of these lesions demonstrate high T2 signal intensity, peripheral nodular enhancement and complete or near complete filling in on delayed images. The lesions have the same signal intensity as the  blood pool on the delayed images. These are consistent with benign hepatic hemangiomas. I do not see any worrisome hepatic lesions to suggest hepatic metastatic disease. The gallbladder is unremarkable. No intra or extrahepatic biliary dilatation. Pancreas:  Normal. Spleen:  Normal size.  No focal lesions. Adrenals/Urinary Tract: The adrenal glands are normal. No adrenal gland metastasis are identified. Both kidneys are normal. Stomach/Bowel: The stomach, duodenum, visualized small bowel and visualized colon are unremarkable. No acute inflammatory changes or mass lesions. No obstructive findings. Vascular/Lymphatic: The aorta and branch vessels are normal. The major venous structures are patent. No mesenteric or retroperitoneal mass or adenopathy. Other:  No ascites or abdominal wall hernia. Musculoskeletal: No significant bony findings. Small hemangioma noted in L1. IMPRESSION: 1. 5 liver lesions are consistent with benign hepatic hemangiomas. No worrisome hepatic lesions to suggest hepatic metastatic disease. 2. No acute abdominal findings or adenopathy. 3. Borderline enlarged epicardial lymph nodes are noted. Electronically Signed   By: Marijo Sanes M.D.   On: 07/20/2020 13:54   US Venous Img Upper Uni Right(DVT)  Result Date: 07/20/2020 CLINICAL DATA:  Right upper extremity swelling EXAM: RIGHT UPPER EXTREMITY VENOUS DOPPLER ULTRASOUND TECHNIQUE: Gray-scale sonography with graded compression, as well as color Doppler and duplex ultrasound were performed to evaluate the upper extremity deep venous system from the level of the subclavian vein and including the jugular, axillary, basilic, radial, ulnar and upper cephalic vein. Spectral Doppler was utilized to evaluate flow at rest and with distal augmentation maneuvers. COMPARISON:  None. FINDINGS:  Contralateral Subclavian Vein: Respiratory phasicity is normal and symmetric with the symptomatic side. No evidence of thrombus. Normal compressibility. Internal  Jugular Vein: No evidence of thrombus. Normal compressibility, respiratory phasicity and response to augmentation. Subclavian Vein: No evidence of thrombus. Normal compressibility, respiratory phasicity and response to augmentation. Axillary Vein: No evidence of thrombus. Normal compressibility, respiratory phasicity and response to augmentation. Cephalic Vein: No evidence of thrombus. Normal compressibility, respiratory phasicity and response to augmentation. Basilic Vein: No evidence of thrombus. Normal compressibility, respiratory phasicity and response to augmentation. Brachial Veins: No evidence of thrombus. Normal compressibility, respiratory phasicity and response to augmentation. Radial Veins: No evidence of thrombus. Normal compressibility, respiratory phasicity and response to augmentation. Ulnar Veins: No evidence of thrombus. Normal compressibility, respiratory phasicity and response to augmentation. Venous Reflux:  None visualized. Other Findings:  None visualized. IMPRESSION: No evidence of DVT within the right upper extremity. Electronically Signed   By: Rolm Baptise M.D.   On: 07/20/2020 12:01   (Echo, Carotid, EGD, Colonoscopy, ERCP)    Subjective: Patient seen and examined in the morning with his daughter at the bedside.  He has some dull ache on his left side of the chest otherwise denies any complaints.  Seen in the evening for discharge readiness.  He has no complaints.  He is agreeable to go home and understands the instructions that he will come back to the pulmonary office next week and will have subsequent bronchoscopy and biopsy.   Discharge Exam: Vitals:   07/21/20 0805 07/21/20 1123  BP: (!) 133/80 123/79  Pulse: 66 62  Resp: 16 17  Temp: 98.3 F (36.8 C) 98.2 F (36.8 C)  SpO2: 97% 99%   Vitals:   07/21/20 0017 07/21/20 0416 07/21/20 0805 07/21/20 1123  BP: 107/74 115/79 (!) 133/80 123/79  Pulse: 76 69 66 62  Resp:   16 17  Temp: 98.5 F (36.9 C) 98.1 F (36.7  C) 98.3 F (36.8 C) 98.2 F (36.8 C)  TempSrc: Oral Oral    SpO2: 97% 99% 97% 99%  Weight:      Height:        General: Pt is alert, awake, not in acute distress Cardiovascular: RRR, S1/S2 +, no rubs, no gallops Respiratory: CTA bilaterally, no wheezing, no rhonchi, no added sounds Abdominal: Soft, NT, ND, bowel sounds + Extremities: no edema, no cyanosis    The results of significant diagnostics from this hospitalization (including imaging, microbiology, ancillary and laboratory) are listed below for reference.     Microbiology: Recent Results (from the past 240 hour(s))  SARS Coronavirus 2 by RT PCR (hospital order, performed in [facility] hospital lab) Nasopharyngeal Nasopharyngeal Swab     Status: None   Collection Time: 07/20/20 12:01 PM   Specimen: Nasopharyngeal Swab  Result Value Ref Range Status   SARS Coronavirus 2 NEGATIVE NEGATIVE Final    Comment: (NOTE) SARS-CoV-2 target nucleic acids are NOT DETECTED.  The SARS-CoV-2 RNA is generally detectable in upper and lower respiratory specimens during the acute phase of infection. The lowest concentration of SARS-CoV-2 viral copies this assay can detect is 250 copies / mL. A negative result does not preclude SARS-CoV-2 infection and should not be used as the sole basis for treatment or other patient management decisions.  A negative result may occur with improper specimen collection / handling, submission of specimen other than nasopharyngeal swab, presence of viral mutation(s) within the areas targeted by this assay, and inadequate number of viral copies (<250 copies / mL).  A negative result must be combined with clinical observations, patient history, and epidemiological information.  Fact Sheet for Patients:   StrictlyIdeas.no  Fact Sheet for Healthcare Providers: BankingDealers.co.za  This test is not yet approved or  cleared by the [place] FDA and has  been authorized for detection and/or diagnosis of SARS-CoV-2 by FDA under an Emergency Use Authorization (EUA).  This EUA will remain in effect (meaning this test can be used) for the duration of the COVID-19 declaration under Section 564(b)(1) of the Act, 21 U.S.C. section 360bbb-3(b)(1), unless the authorization is terminated or revoked sooner.  Performed at [facility], [address] [place], [place] [address]      Labs: BNP (last 3 results) No results for input(s): BNP in the last 8760 hours. Basic Metabolic Panel: Recent Labs  Lab 07/19/20 1925 07/20/20 1048 07/21/20 0504  NA 125* 129* 134*  K 3.7 4.7 4.6  CL 90* 94* 102  CO2 22 27 28   GLUCOSE 88 104* 94  BUN <5* 9 7  CREATININE 0.64 0.70 0.72  CALCIUM 9.2 9.1 9.0   Liver Function Tests: Recent Labs  Lab 07/20/20 1048  AST 16  ALT 13  ALKPHOS 81  BILITOT 0.6  PROT 8.1  ALBUMIN 3.9   No results for input(s): LIPASE, AMYLASE in the last 168 hours. No results for input(s): AMMONIA in the last 168 hours. CBC: Recent Labs  Lab 07/19/20 1925 07/20/20 1048 07/21/20 0504  WBC 15.1* 12.0* 10.0  NEUTROABS  --  8.7*  --   HGB 12.1* 13.3 12.1*  HCT 36.4* 38.1* 35.5*  MCV 84.8 83.4 84.5  PLT 428* 414* 402*   Cardiac Enzymes: No results for input(s): CKTOTAL, CKMB, CKMBINDEX, TROPONINI in the last 168 hours. BNP: Invalid input(s): POCBNP CBG: No results for input(s): GLUCAP in the last 168 hours. D-Dimer No results for input(s): DDIMER in the last 72 hours. Hgb A1c No results for input(s): HGBA1C in the last 72 hours. Lipid Profile No results for input(s): CHOL, HDL, LDLCALC, TRIG, CHOLHDL, LDLDIRECT in the last 72 hours. Thyroid function studies No results for input(s): TSH, T4TOTAL, T3FREE, THYROIDAB in the last 72 hours.  Invalid input(s): FREET3 Anemia work up No results for input(s): VITAMINB12, FOLATE, FERRITIN, TIBC, IRON, RETICCTPCT in the last 72 hours. Urinalysis No results  found for: COLORURINE, APPEARANCEUR, [place], [place], [place], [place], [place], [place], PROTEINUR, UROBILINOGEN, NITRITE, LEUKOCYTESUR Sepsis Labs Invalid input(s): PROCALCITONIN,  WBC,  LACTICIDVEN Microbiology Recent Results (from the past 240 hour(s))  SARS Coronavirus 2 by RT PCR (hospital order, performed in [facility] hospital lab) Nasopharyngeal Nasopharyngeal Swab     Status: None   Collection Time: 07/20/20 12:01 PM   Specimen: Nasopharyngeal Swab  Result Value Ref Range Status   SARS Coronavirus 2 NEGATIVE NEGATIVE Final    Comment: (NOTE) SARS-CoV-2 target nucleic acids are NOT DETECTED.  The SARS-CoV-2 RNA is generally detectable in upper and lower respiratory specimens during the acute phase of infection. The lowest concentration of SARS-CoV-2 viral copies this assay can detect is 250 copies / mL. A negative result does not preclude SARS-CoV-2 infection and should not be used as the sole basis for treatment or other patient management decisions.  A negative result may occur with improper specimen collection / handling, submission of specimen other than nasopharyngeal swab, presence of viral mutation(s) within the areas targeted by this assay, and inadequate number of viral copies (<250 copies / mL). A negative result must be combined with clinical observations, patient  history, and epidemiological information.  Fact Sheet for Patients:   StrictlyIdeas.no  Fact Sheet for Healthcare Providers: BankingDealers.co.za  This test is not yet approved or  cleared by the [place] FDA and has been authorized for detection and/or diagnosis of SARS-CoV-2 by FDA under an Emergency Use Authorization (EUA).  This EUA will remain in effect (meaning this test can be used) for the duration of the COVID-19 declaration under Section 564(b)(1) of the Act, 21 U.S.C. section 360bbb-3(b)(1), unless the authorization is terminated  or revoked sooner.  Performed at [facility], [address], [place], [place] [address]      Time coordinating discharge: 35 minutes  SIGNED:   Barb Merino, MD  [facility] 07/21/2020, 2:43 PM

## 2020-07-21 NOTE — TOC Transition Note (Signed)
Transition of Care [facility]) - CM/SW Discharge Note   Patient Details  Name: Wesley Ferguson MRN: [id number] Date of Birth: 1966/02/02  [facility] CM/SW Contact:  Shelbie Hutching, RN Phone Number: 07/21/2020, 3:43 PM   Clinical Narrative:    Patient discharged home with self care.  Patient has one prescription to get filled.  It is too late in the day to get the prescription from Medication Management but I called the patient and instructed him to go to goodrx.com where he will be able to get a coupon for the Levaquin.  Patient verbalizes understanding.      Barriers to Discharge: Continued Medical Work up   Patient Goals and CMS Choice        Discharge Placement                       Discharge Plan and Services   Discharge Planning Services: CM Consult, Medication Assistance, [facility]              DME Agency: NA       HH Arranged: NA          Social Determinants of Health (SDOH) Interventions     Readmission Risk Interventions No flowsheet data found.

## 2020-07-29 ENCOUNTER — Other Ambulatory Visit: Payer: Self-pay

## 2020-07-29 ENCOUNTER — Encounter: Payer: Self-pay | Admitting: Pulmonary Disease

## 2020-07-29 ENCOUNTER — Telehealth: Payer: Self-pay

## 2020-07-29 ENCOUNTER — Ambulatory Visit (INDEPENDENT_AMBULATORY_CARE_PROVIDER_SITE_OTHER): Payer: Self-pay | Admitting: Pulmonary Disease

## 2020-07-29 VITALS — BP 132/80 | HR 80 | Temp 97.8°F | Ht 74.0 in | Wt 182.0 lb

## 2020-07-29 DIAGNOSIS — J449 Chronic obstructive pulmonary disease, unspecified: Secondary | ICD-10-CM

## 2020-07-29 DIAGNOSIS — J984 Other disorders of lung: Secondary | ICD-10-CM

## 2020-07-29 DIAGNOSIS — F17210 Nicotine dependence, cigarettes, uncomplicated: Secondary | ICD-10-CM

## 2020-07-29 DIAGNOSIS — R042 Hemoptysis: Secondary | ICD-10-CM

## 2020-07-29 DIAGNOSIS — R590 Localized enlarged lymph nodes: Secondary | ICD-10-CM

## 2020-07-29 MED ORDER — STIOLTO RESPIMAT 2.5-2.5 MCG/ACT IN AERS
2.0000 | INHALATION_SPRAY | Freq: Every day | RESPIRATORY_TRACT | 0 refills | Status: AC
Start: 2020-07-29 — End: 2020-07-30

## 2020-07-29 NOTE — H&P (View-Only) (Signed)
Subjective:    Patient ID: Wesley Ferguson, male    DOB: [birth year], 54 y.o.   MRN: [id number]  HPI Wesley Ferguson is a 54 year old male, current smoker (2 to 3 packs/day), who presented to [facility] on 20 July 2020 for evaluation of left-sided chest pain mostly along the left lateral rib cage.  He was admitted to [facility] at that time for management of  pneumonia. He is quite a challenging historian.  Patient states that the pain on his left side increased several days prior to admission.  Approximately 1 month ago he had streaky hemoptysis, he had another episode of streaky hemoptysis yesterday.  He has had some dyspnea over baseline.  He has not had any fevers, chills or sweats.  No weight loss or change in appetite.  He had initially a chest x-ray that showed a left upper lung zone cavitary density.  Chest CT also showed a lingular pneumonia.  He has completed antibiotic therapy for his lingular pneumonia.  He presents today as follow-up from my initial consultation on 21 July 2020.  For the details of that consultation please refer to that note in Epic.  He has had mild dyspnea on exertion.  Patient has not had any orthopnea or paroxysmal nocturnal dyspnea.  As noted he has had cough productive mostly of whitish to grayish sputum though lately some green sputum noted.  Tricky hemoptysis as noted above. Cough is mostly in the mornings.  He voices no other complaints.  Review of Systems A 10 point review of systems was performed and it is as noted above otherwise negative.  No Known Allergies  On no chronic medications.  Finished Levaquin.  Social History   Tobacco Use  . Smoking status: Current Every Day Smoker    Packs/day: 3.00    Years: 35.00    Pack years: 105.00  . Smokeless tobacco: Never Used  . Tobacco comment: 1 pack per day--07/29/2020  Substance Use Topics  . Alcohol use: Yes      Objective:   Physical Exam BP 132/80 (BP Location: Left Arm, Cuff Size: Normal)   Pulse 80   Temp 97.8  F (36.6 C) (Temporal)   Ht 6\' 2"  (1.88 m)   Wt 182 lb (82.6 kg)   SpO2 100%   BMI 23.37 kg/m  GENERAL: Well-developed well-nourished gentleman, no acute distress.  Fully ambulatory.  HEAD: Normocephalic, atraumatic.  EYES: Pupils equal, round, reactive to light.  No scleral icterus.   MOUTH: Dentures upper and lower.  Mallampati class II.  No thrush. NECK: Supple. No thyromegaly. Trachea midline. No JVD.  No adenopathy. PULMONARY: Good air entry bilaterally.  Coarse breath sounds with no other adventitious sounds.  CARDIOVASCULAR: S1 and S2. Regular rate and rhythm.  No rubs, murmurs or gallops heard. GASTROINTESTINAL: Benign. MUSCULOSKELETAL: No joint deformity, no clubbing, no edema.  NEUROLOGIC: No focal deficit, fluent speech.  Appears to have mild memory impairment.  No gait disturbance. SKIN: Intact,warm,dry.  Limited exam of skin: No rashes PSYCH: Affect flat, behavior normal.  Representative slices of CT scan performed 20 July 2020 and independently reviewed:       Assessment & Plan:     ICD-10-CM   1. Cavitating mass of upper lobe of left lung  J98.4    Bronchoscopy with navigational assistance 07 August 2020 7:30 AM  2. Mediastinal adenopathy  R59.0    EBUS with TBNA at the time of navigational bronchoscopy  3. Hemoptysis  R04.2    Likely due  to mass of the left lung  4. COPD suggested by initial evaluation ([place])  J44.9    Trial of Stiolto Respimat 2 puffs daily daily  5. Tobacco dependence due to cigarettes  F17.210    Patient counseled regards to discontinuation of smoking Total counseling time 3 to 5 minutes   Meds ordered this encounter  Medications  . Tiotropium Bromide-Olodaterol (STIOLTO RESPIMAT) 2.5-2.5 MCG/ACT AERS    Sig: Inhale 2 puffs into the lungs daily for 1 day.    Dispense:  4 g    Refill:  0    Order Specific Question:   Lot Number?    Answer:   [id number]    Order Specific Question:   Expiration Date?    Answer:   05/26/2022    Order Specific  Question:   Quantity    Answer:   3   Discussion:  The patient has a cavitary left upper lobe mass which is suspicious for malignancy.  He also has significant mediastinal adenopathy.  He will need bronchoscopy with navigational assistance to diagnose the left upper lobe mass as well as endobronchial ultrasound with TBNA for evaluation of lymphadenopathy.  Benefits, limitations and potential complications of the procedure were discussed with the patient//daughter including, but not limited to bleeding, hemoptysis, respiratory failure requiring intubation and/or prolongued mechanical ventilation, infection, pneumothorax (collapse of lung) requiring chest tube placement, stroke or even death.  Patient agrees to proceed.  Procedure scheduled for 13 August at 7:30 in the morning.  Renold Don, MD [facility]   *This note was dictated using voice recognition software/Dragon.  Despite best efforts to proofread, errors can occur which can change the meaning.  Any change was purely unintentional.

## 2020-07-29 NOTE — Telephone Encounter (Signed)
Pt's daughter, Wesley Ferguson, requested that we contact her with date/time of covid test and pre admit. [phone]

## 2020-07-29 NOTE — Patient Instructions (Signed)
Reschedule your procedure for 13 August at 7:30 in the morning  Nothing to eat or drink from midnight the night prior  We will see you in follow-up then 4-6 weeks time  We will let you know the results of your biopsies prior to follow-up  They are giving you a trial of Stiolto Respimat 2 inhalations daily to help with your breathing  Pending what we find, we will need to order other studies to include a PET/CT and breathing tests.

## 2020-07-29 NOTE — Telephone Encounter (Signed)
Bronchoscopy with navigation and cellvizio and EBUS scheduled for 08/07/20 at 7:30a. CPT:31627,[id number] DJ:TTSV mass ( J84.4)

## 2020-07-29 NOTE — Progress Notes (Signed)
Subjective:    Patient ID: Wesley Ferguson, male    DOB: May 23, 1966, 54 y.o.   MRN: [id number]  HPI Wesley Ferguson is a 54 year old male, current smoker (2 to 3 packs/day), who presented to [facility] on 20 July 2020 for evaluation of left-sided chest pain mostly along the left lateral rib cage.  He was admitted to [facility] at that time for management of  pneumonia. He is quite a challenging historian.  Patient states that the pain on his left side increased several days prior to admission.  Approximately 1 month ago he had streaky hemoptysis, he had another episode of streaky hemoptysis yesterday.  He has had some dyspnea over baseline.  He has not had any fevers, chills or sweats.  No weight loss or change in appetite.  He had initially a chest x-ray that showed a left upper lung zone cavitary density.  Chest CT also showed a lingular pneumonia.  He has completed antibiotic therapy for his lingular pneumonia.  He presents today as follow-up from my initial consultation on 21 July 2020.  For the details of that consultation please refer to that note in Epic.  He has had mild dyspnea on exertion.  Patient has not had any orthopnea or paroxysmal nocturnal dyspnea.  As noted he has had cough productive mostly of whitish to grayish sputum though lately some green sputum noted.  Tricky hemoptysis as noted above. Cough is mostly in the mornings.  He voices no other complaints.  Review of Systems A 10 point review of systems was performed and it is as noted above otherwise negative.  No Known Allergies  On no chronic medications.  Finished Levaquin.  Social History   Tobacco Use  . Smoking status: Current Every Day Smoker    Packs/day: 3.00    Years: 35.00    Pack years: 105.00  . Smokeless tobacco: Never Used  . Tobacco comment: 1 pack per day--07/29/2020  Substance Use Topics  . Alcohol use: Yes      Objective:   Physical Exam BP 132/80 (BP Location: Left Arm, Cuff Size: Normal)   Pulse 80   Temp 97.8  F (36.6 C) (Temporal)   Ht 6\' 2"  (1.88 m)   Wt 182 lb (82.6 kg)   SpO2 100%   BMI 23.37 kg/m  GENERAL: Well-developed well-nourished gentleman, no acute distress.  Fully ambulatory.  HEAD: Normocephalic, atraumatic.  EYES: Pupils equal, round, reactive to light.  No scleral icterus.   MOUTH: Dentures upper and lower.  Mallampati class II.  No thrush. NECK: Supple. No thyromegaly. Trachea midline. No JVD.  No adenopathy. PULMONARY: Good air entry bilaterally.  Coarse breath sounds with no other adventitious sounds.  CARDIOVASCULAR: S1 and S2. Regular rate and rhythm.  No rubs, murmurs or gallops heard. GASTROINTESTINAL: Benign. MUSCULOSKELETAL: No joint deformity, no clubbing, no edema.  NEUROLOGIC: No focal deficit, fluent speech.  Appears to have mild memory impairment.  No gait disturbance. SKIN: Intact,warm,dry.  Limited exam of skin: No rashes PSYCH: Affect flat, behavior normal.  Representative slices of CT scan performed 20 July 2020 and independently reviewed:       Assessment & Plan:     ICD-10-CM   1. Cavitating mass of upper lobe of left lung  J98.4    Bronchoscopy with navigational assistance 07 August 2020 7:30 AM  2. Mediastinal adenopathy  R59.0    EBUS with TBNA at the time of navigational bronchoscopy  3. Hemoptysis  R04.2    Likely due  to mass of the left lung  4. COPD suggested by initial evaluation ([place])  J44.9    Trial of Stiolto Respimat 2 puffs daily daily  5. Tobacco dependence due to cigarettes  F17.210    Patient counseled regards to discontinuation of smoking Total counseling time 3 to 5 minutes   Meds ordered this encounter  Medications  . Tiotropium Bromide-Olodaterol (STIOLTO RESPIMAT) 2.5-2.5 MCG/ACT AERS    Sig: Inhale 2 puffs into the lungs daily for 1 day.    Dispense:  4 g    Refill:  0    Order Specific Question:   Lot Number?    Answer:   [id number]    Order Specific Question:   Expiration Date?    Answer:   05/26/2022    Order Specific  Question:   Quantity    Answer:   3   Discussion:  The patient has a cavitary left upper lobe mass which is suspicious for malignancy.  He also has significant mediastinal adenopathy.  He will need bronchoscopy with navigational assistance to diagnose the left upper lobe mass as well as endobronchial ultrasound with TBNA for evaluation of lymphadenopathy.  Benefits, limitations and potential complications of the procedure were discussed with the patient//daughter including, but not limited to bleeding, hemoptysis, respiratory failure requiring intubation and/or prolongued mechanical ventilation, infection, pneumothorax (collapse of lung) requiring chest tube placement, stroke or even death.  Patient agrees to proceed.  Procedure scheduled for 13 August at 7:30 in the morning.  Wesley Don, MD [facility]   *This note was dictated using voice recognition software/Dragon.  Despite best efforts to proofread, errors can occur which can change the meaning.  Any change was purely unintentional.

## 2020-07-29 NOTE — Telephone Encounter (Signed)
No Insurance at the present time on this patient, therefore, no PA required for Self Pay. Wesley Ferguson

## 2020-07-30 NOTE — Telephone Encounter (Signed)
Phone pre admit visit 08/04/2020 between 8-1 and covid test 08/05/2020 between 8-1.  Lm for pt's daughter, Samantha(DPR).

## 2020-07-31 ENCOUNTER — Encounter: Payer: Self-pay | Admitting: Pulmonary Disease

## 2020-07-31 NOTE — Telephone Encounter (Signed)
Pt's daughter, Samantha(DPR) is aware of below dates/times and voiced her understanding. Nothing further is needed.

## 2020-08-04 ENCOUNTER — Other Ambulatory Visit: Payer: Self-pay

## 2020-08-04 ENCOUNTER — Encounter
Admission: RE | Admit: 2020-08-04 | Discharge: 2020-08-04 | Disposition: A | Discharge status: Home / Self Care | Payer: Medicaid Other | Source: Ambulatory Visit | Attending: Pulmonary Disease | Admitting: Pulmonary Disease

## 2020-08-04 HISTORY — DX: Pneumonia, unspecified organism: J18.9

## 2020-08-04 MED ORDER — PROPOFOL 10 MG/ML IV BOLUS
INTRAVENOUS | Status: AC
  Filled 2020-08-04: qty 40

## 2020-08-04 MED ORDER — MIDAZOLAM HCL 2 MG/2ML IJ SOLN
INTRAMUSCULAR | Status: AC
  Filled 2020-08-04: qty 2

## 2020-08-04 MED ORDER — FENTANYL CITRATE (PF) 100 MCG/2ML IJ SOLN
INTRAMUSCULAR | Status: AC
  Filled 2020-08-04: qty 2

## 2020-08-04 NOTE — Patient Instructions (Addendum)
COVID TESTING Date: Wednesday, August 11 Testing site:  [facility] [address] Thru Hours:  9:62 am - 1:00 pm Once you are tested, you are asked to stay quarantined (avoiding public places) until after your surgery.   Your procedure is scheduled on: Friday, August 13 Report to Day Surgery on the 2nd floor of the [facility]. To find out your arrival time, please call [phone] between 1PM - 3PM on: Thursday, August 12  REMEMBER: Instructions that are not followed completely may result in serious medical risk, up to and including death; or upon the discretion of your surgeon and anesthesiologist your surgery may need to be rescheduled.  Do not eat food after midnight the night before surgery.  No gum chewing, lozengers or hard candies.  You may however, drink CLEAR liquids up to 2 hours before you are scheduled to arrive for your surgery. Do not drink anything within 2 hours of your scheduled arrival time.  Clear liquids include: - water  - apple juice without pulp - gatorade (not RED) - black coffee or tea (Do NOT add milk or creamers to the coffee or tea) Do NOT drink anything that is not on this list.  TAKE THESE MEDICATIONS THE MORNING OF SURGERY WITH A SIP OF WATER:  1.  Tiotropium Bromide inhaler  Stop Anti-inflammatories (NSAIDS) such as Advil, Aleve, Ibuprofen, Motrin, Naproxen, Naprosyn and Aspirin based products such as Excedrin, Goodys Powder, BC Powder. (May take Tylenol or Acetaminophen if needed.)  Stop ANY OVER THE COUNTER supplements until after surgery.  No Alcohol for 24 hours before or after surgery.  No Smoking including e-cigarettes for 24 hours prior to surgery.  No chewable tobacco products for at least 6 hours prior to surgery.  No nicotine patches on the day of surgery.  On the morning of surgery brush your teeth with toothpaste and water, you may rinse your mouth with mouthwash if you wish. Do not swallow any  toothpaste or mouthwash.  Do not wear jewelry.  Do not wear lotions, powders, or perfumes.   Dentures may not be worn into surgery.  Do not bring valuables to the hospital. [facility] is not responsible for any missing/lost belongings or valuables.   Notify your doctor if there is any change in your medical condition (cold, fever, infection).  Wear comfortable clothing (specific to your surgery type) to the hospital.  If you are being discharged the day of surgery, you will not be allowed to drive home. You will need a responsible adult (18 years or older) to drive you home and stay with you that night.   If you are taking public transportation, you will need to have a responsible adult (18 years or older) with you. Please confirm with your physician that it is acceptable to use public transportation.   Please call the [place] [facility]. at [phone] if you have any questions about these instructions.  Visitation Policy:  Patients undergoing a surgery or procedure may have one family member or support person with them as long as that person is not COVID-19 positive or experiencing its symptoms.  That person may remain in the waiting area during the procedure.  Masking is required regardless of vaccination status.

## 2020-08-05 ENCOUNTER — Other Ambulatory Visit: Payer: Medicaid Other

## 2020-08-05 ENCOUNTER — Other Ambulatory Visit
Admission: RE | Admit: 2020-08-05 | Discharge: 2020-08-05 | Disposition: A | Discharge status: Home / Self Care | Payer: Medicaid Other | Source: Ambulatory Visit | Attending: Pulmonary Disease | Admitting: Pulmonary Disease

## 2020-08-05 DIAGNOSIS — Z01818 Encounter for other preprocedural examination: Secondary | ICD-10-CM | POA: Diagnosis not present

## 2020-08-05 DIAGNOSIS — Z20822 Contact with and (suspected) exposure to covid-19: Secondary | ICD-10-CM | POA: Insufficient documentation

## 2020-08-05 DIAGNOSIS — Z01810 Encounter for preprocedural cardiovascular examination: Secondary | ICD-10-CM

## 2020-08-05 LAB — SARS CORONAVIRUS 2 (TAT 6-24 HRS): SARS Coronavirus 2: NEGATIVE

## 2020-08-07 ENCOUNTER — Ambulatory Visit: Payer: Medicaid Other | Admitting: Urgent Care

## 2020-08-07 ENCOUNTER — Encounter
Admission: RE | Disposition: A | Discharge status: Home / Self Care | Payer: Self-pay | Source: Home / Self Care | Attending: Pulmonary Disease

## 2020-08-07 ENCOUNTER — Ambulatory Visit
Admission: RE | Admit: 2020-08-07 | Discharge: 2020-08-07 | Disposition: A | Discharge status: Home / Self Care | Payer: Medicaid Other | Attending: Pulmonary Disease | Admitting: Pulmonary Disease

## 2020-08-07 ENCOUNTER — Encounter: Payer: Self-pay | Admitting: *Deleted

## 2020-08-07 ENCOUNTER — Ambulatory Visit: Payer: Medicaid Other

## 2020-08-07 ENCOUNTER — Other Ambulatory Visit: Payer: Self-pay

## 2020-08-07 ENCOUNTER — Encounter: Payer: Self-pay | Admitting: Pulmonary Disease

## 2020-08-07 DIAGNOSIS — R918 Other nonspecific abnormal finding of lung field: Secondary | ICD-10-CM

## 2020-08-07 DIAGNOSIS — R590 Localized enlarged lymph nodes: Secondary | ICD-10-CM

## 2020-08-07 DIAGNOSIS — J984 Other disorders of lung: Secondary | ICD-10-CM

## 2020-08-07 DIAGNOSIS — R0781 Pleurodynia: Secondary | ICD-10-CM | POA: Diagnosis present

## 2020-08-07 DIAGNOSIS — F17210 Nicotine dependence, cigarettes, uncomplicated: Secondary | ICD-10-CM | POA: Insufficient documentation

## 2020-08-07 DIAGNOSIS — C3492 Malignant neoplasm of unspecified part of left bronchus or lung: Secondary | ICD-10-CM | POA: Diagnosis not present

## 2020-08-07 DIAGNOSIS — C3412 Malignant neoplasm of upper lobe, left bronchus or lung: Secondary | ICD-10-CM | POA: Diagnosis not present

## 2020-08-07 DIAGNOSIS — R042 Hemoptysis: Secondary | ICD-10-CM | POA: Diagnosis not present

## 2020-08-07 DIAGNOSIS — J449 Chronic obstructive pulmonary disease, unspecified: Secondary | ICD-10-CM | POA: Diagnosis not present

## 2020-08-07 DIAGNOSIS — C3490 Malignant neoplasm of unspecified part of unspecified bronchus or lung: Secondary | ICD-10-CM

## 2020-08-07 DIAGNOSIS — Z9889 Other specified postprocedural states: Secondary | ICD-10-CM

## 2020-08-07 HISTORY — PX: VIDEO BRONCHOSCOPY WITH ENDOBRONCHIAL NAVIGATION: SHX6175

## 2020-08-07 HISTORY — PX: VIDEO BRONCHOSCOPY WITH ENDOBRONCHIAL ULTRASOUND: SHX6177

## 2020-08-07 SURGERY — VIDEO BRONCHOSCOPY WITH ENDOBRONCHIAL NAVIGATION
Anesthesia: General

## 2020-08-07 MED ORDER — FENTANYL CITRATE (PF) 100 MCG/2ML IJ SOLN: 25.0000 ug | INTRAMUSCULAR | Status: DC | PRN

## 2020-08-07 MED ORDER — BUTAMBEN-TETRACAINE-BENZOCAINE 2-2-14 % EX AERO
1.0000 | INHALATION_SPRAY | Freq: Once | CUTANEOUS | Status: DC
  Filled 2020-08-07: qty 20

## 2020-08-07 MED ORDER — SEVOFLURANE IN SOLN
RESPIRATORY_TRACT | Status: AC
  Filled 2020-08-07: qty 250

## 2020-08-07 MED ORDER — PROPOFOL 10 MG/ML IV BOLUS
INTRAVENOUS | Status: DC | PRN
  Administered 2020-08-07: 170 mg via INTRAVENOUS

## 2020-08-07 MED ORDER — LIDOCAINE HCL 4 % MT SOLN
OROMUCOSAL | Status: DC | PRN
  Administered 2020-08-07: 4 mL via TOPICAL

## 2020-08-07 MED ORDER — FAMOTIDINE 20 MG PO TABS
ORAL_TABLET | ORAL | Status: AC
  Administered 2020-08-07: 20 mg via ORAL
  Filled 2020-08-07: qty 1

## 2020-08-07 MED ORDER — LIDOCAINE HCL (CARDIAC) PF 100 MG/5ML IV SOSY
PREFILLED_SYRINGE | INTRAVENOUS | Status: DC | PRN
  Administered 2020-08-07: 60 mg via INTRAVENOUS

## 2020-08-07 MED ORDER — FENTANYL CITRATE (PF) 100 MCG/2ML IJ SOLN
INTRAMUSCULAR | Status: AC
  Filled 2020-08-07: qty 2

## 2020-08-07 MED ORDER — CHLORHEXIDINE GLUCONATE 0.12 % MT SOLN
OROMUCOSAL | Status: AC
  Administered 2020-08-07: 15 mL via OROMUCOSAL
  Filled 2020-08-07: qty 15

## 2020-08-07 MED ORDER — HYDROCOD POLST-CPM POLST ER 10-8 MG/5ML PO SUER
5.0000 mL | Freq: Once | ORAL | Status: AC
  Administered 2020-08-07: 5 mL via ORAL
  Filled 2020-08-07: qty 5

## 2020-08-07 MED ORDER — PHENYLEPHRINE HCL (PRESSORS) 10 MG/ML IV SOLN
INTRAVENOUS | Status: DC | PRN
  Administered 2020-08-07: 100 ug via INTRAVENOUS
  Administered 2020-08-07: 200 ug via INTRAVENOUS
  Administered 2020-08-07 (×2): 100 ug via INTRAVENOUS
  Administered 2020-08-07: 200 ug via INTRAVENOUS
  Administered 2020-08-07: 100 ug via INTRAVENOUS
  Administered 2020-08-07 (×3): 200 ug via INTRAVENOUS

## 2020-08-07 MED ORDER — ORAL CARE MOUTH RINSE: 15.0000 mL | Freq: Once | OROMUCOSAL | Status: AC

## 2020-08-07 MED ORDER — DEXAMETHASONE SODIUM PHOSPHATE 10 MG/ML IJ SOLN
INTRAMUSCULAR | Status: AC
  Filled 2020-08-07: qty 1

## 2020-08-07 MED ORDER — ROCURONIUM BROMIDE 100 MG/10ML IV SOLN
INTRAVENOUS | Status: DC | PRN
  Administered 2020-08-07: 70 mg via INTRAVENOUS

## 2020-08-07 MED ORDER — FENTANYL CITRATE (PF) 100 MCG/2ML IJ SOLN
INTRAMUSCULAR | Status: DC | PRN
  Administered 2020-08-07 (×2): 50 ug via INTRAVENOUS

## 2020-08-07 MED ORDER — MIDAZOLAM HCL 2 MG/2ML IJ SOLN
INTRAMUSCULAR | Status: DC | PRN
  Administered 2020-08-07: 2 mg via INTRAVENOUS

## 2020-08-07 MED ORDER — ROCURONIUM BROMIDE 10 MG/ML (PF) SYRINGE
PREFILLED_SYRINGE | INTRAVENOUS | Status: AC
  Filled 2020-08-07: qty 10

## 2020-08-07 MED ORDER — ONDANSETRON HCL 4 MG/2ML IJ SOLN
INTRAMUSCULAR | Status: AC
  Filled 2020-08-07: qty 2

## 2020-08-07 MED ORDER — CHLORHEXIDINE GLUCONATE 0.12 % MT SOLN: 15.0000 mL | Freq: Once | OROMUCOSAL | Status: AC

## 2020-08-07 MED ORDER — SODIUM CHLORIDE 0.9 % IV SOLN: Freq: Once | INTRAVENOUS | Status: DC

## 2020-08-07 MED ORDER — LACTATED RINGERS IV SOLN: INTRAVENOUS | Status: DC

## 2020-08-07 MED ORDER — LIDOCAINE HCL (PF) 2 % IJ SOLN
INTRAMUSCULAR | Status: AC
  Filled 2020-08-07: qty 5

## 2020-08-07 MED ORDER — FAMOTIDINE 20 MG PO TABS: 20.0000 mg | ORAL_TABLET | Freq: Once | ORAL | Status: AC

## 2020-08-07 MED ORDER — IPRATROPIUM-ALBUTEROL 0.5-2.5 (3) MG/3ML IN SOLN: 3.0000 mL | Freq: Once | RESPIRATORY_TRACT | Status: AC

## 2020-08-07 MED ORDER — ONDANSETRON HCL 4 MG/2ML IJ SOLN: 4.0000 mg | Freq: Once | INTRAMUSCULAR | Status: DC | PRN

## 2020-08-07 MED ORDER — SUGAMMADEX SODIUM 200 MG/2ML IV SOLN
INTRAVENOUS | Status: DC | PRN
  Administered 2020-08-07: 200 mg via INTRAVENOUS

## 2020-08-07 MED ORDER — DEXAMETHASONE SODIUM PHOSPHATE 10 MG/ML IJ SOLN
INTRAMUSCULAR | Status: DC | PRN
  Administered 2020-08-07: 10 mg via INTRAVENOUS

## 2020-08-07 MED ORDER — SUCCINYLCHOLINE CHLORIDE 200 MG/10ML IV SOSY
PREFILLED_SYRINGE | INTRAVENOUS | Status: AC
  Filled 2020-08-07: qty 10

## 2020-08-07 MED ORDER — MIDAZOLAM HCL 2 MG/2ML IJ SOLN
INTRAMUSCULAR | Status: AC
  Filled 2020-08-07: qty 2

## 2020-08-07 MED ORDER — ONDANSETRON HCL 4 MG/2ML IJ SOLN
INTRAMUSCULAR | Status: DC | PRN
  Administered 2020-08-07: 4 mg via INTRAVENOUS

## 2020-08-07 MED ORDER — PROPOFOL 10 MG/ML IV BOLUS
INTRAVENOUS | Status: AC
  Filled 2020-08-07: qty 20

## 2020-08-07 MED ORDER — IPRATROPIUM-ALBUTEROL 0.5-2.5 (3) MG/3ML IN SOLN
RESPIRATORY_TRACT | Status: AC
  Administered 2020-08-07: 3 mL via RESPIRATORY_TRACT
  Filled 2020-08-07: qty 3

## 2020-08-07 NOTE — Anesthesia Preprocedure Evaluation (Signed)
Anesthesia Evaluation  Patient identified by MRN, date of birth, ID band Patient awake    Reviewed: Allergy & Precautions, H&P , NPO status , Patient's Chart, lab work & pertinent test results, reviewed documented beta blocker date and time   Airway Mallampati: II  TM Distance: >3 FB Neck ROM: full    Dental  (+) Teeth Intact   Pulmonary pneumonia, resolved, COPD,  COPD inhaler, Current Smoker and Patient abstained from smoking.,    Pulmonary exam normal        Cardiovascular Exercise Tolerance: Good negative cardio ROS Normal cardiovascular exam Rhythm:regular Rate:Normal     Neuro/Psych negative neurological ROS  negative psych ROS   GI/Hepatic negative GI ROS, Neg liver ROS,   Endo/Other  negative endocrine ROS  Renal/GU negative Renal ROS  negative genitourinary   Musculoskeletal   Abdominal   Peds  Hematology negative hematology ROS (+)   Anesthesia Other Findings Past Medical History: No date: COPD (chronic obstructive pulmonary disease) (HCC) No date: Pneumonia Past Surgical History: 2017: BACK SURGERY     Comment:  lumbar region herniated disc BMI    Body Mass Index: 23.49 kg/m     Reproductive/Obstetrics negative OB ROS                             Anesthesia Physical Anesthesia Plan  ASA: III  Anesthesia Plan: General ETT   Post-op Pain Management:    Induction:   PONV Risk Score and Plan: 2  Airway Management Planned:   Additional Equipment:   Intra-op Plan:   Post-operative Plan:   Informed Consent: I have reviewed the patients History and Physical, chart, labs and discussed the procedure including the risks, benefits and alternatives for the proposed anesthesia with the patient or authorized representative who has indicated his/her understanding and acceptance.     Dental Advisory Given  Plan Discussed with: CRNA  Anesthesia Plan Comments:          Anesthesia Quick Evaluation

## 2020-08-07 NOTE — Progress Notes (Signed)
  Oncology Nurse Navigator Documentation  Navigator Location: [facility]-Med Onc (08/07/20 0900) Referral Date to RadOnc/MedOnc: 08/07/20 (08/07/20 0900) )Navigator Encounter Type: Introductory Phone Call (08/07/20 0900)   Abnormal Finding Date: 07/20/20 (08/07/20 0900)                   Treatment Phase: Abnormal Scans (08/07/20 0900) Barriers/Navigation Needs: Coordination of Care (08/07/20 0900)   Interventions: Coordination of Care (08/07/20 0900)   Coordination of Care: Appts;Radiology (08/07/20 0900)       spoke with pt's daughter to introduce to navigator services and review upcoming appts. All questions answered during call. Reviewed all upcoming appts. Contact info given and instructed to call back with any further questions or needs. Pt's daughter verbalized understanding.            Time Spent with Patient: 30 (08/07/20 0900)

## 2020-08-07 NOTE — Anesthesia Procedure Notes (Signed)
Procedure Name: Intubation Date/Time: 08/07/2020 7:57 AM Performed by: Eben Burow, CRNA Pre-anesthesia Checklist: Patient identified, Emergency Drugs available, Suction available and Patient being monitored Patient Re-evaluated:Patient Re-evaluated prior to induction Oxygen Delivery Method: Circle system utilized Preoxygenation: Pre-oxygenation with 100% oxygen Induction Type: IV induction Ventilation: Mask ventilation without difficulty Laryngoscope Size: Miller and 3 Grade View: Grade I Tube type: Oral Tube size (mm): 9.5. Number of attempts: 1 Airway Equipment and Method: Stylet,  Oral airway and LTA kit utilized Placement Confirmation: ETT inserted through vocal cords under direct vision,  positive ETCO2 and breath sounds checked- equal and bilateral Secured at: 23 cm Tube secured with: Tape Dental Injury: Teeth and Oropharynx as per pre-operative assessment  Comments: 9.84m OETT placed without difficulty

## 2020-08-07 NOTE — Progress Notes (Addendum)
°   08/07/20 0750  Clinical Encounter Type  Visited With Patient;Family  Visit Type Initial  Referral From Chaplain  Consult/Referral To Chaplain  While rounding [facility] waiting area, chaplain stopped and spoke with patient's daughter. Patient's daughter was concerned because her father's procedure was scheduled to start at 7:15 and it hadn't started yet. Chaplain told daughter that she would try to find out what was going on. As chaplain entered area where patient was, nurse show her where patient was, and he was being transferred out. Chaplain stopped and spoke to patient and told him his daughter was concerned, but she would let daughter know that she spoke with patient and he was being transferred to OR now. Chaplain went back to [place] waiting area and told patient's daughter that she spoke to patient and daughter was relieved and said thank you.

## 2020-08-07 NOTE — Op Note (Signed)
PROCEDURE:   Bronchoscopy with bronchial biopsies and fluoroscopy guided brushings  Endobronchial ultrasound with TBNA  Navigational bronchoscopy planned but not necessary   PROCEDURE DATE: 08/07/2020  TIME:  NAME:  Wesley Ferguson  DOB:04-Nov-1966  MRN: [id number] LOC:  ARPO/None    HOSP DAY: @LENGTHOFSTAYDAYS @ CODE STATUS:   Code Status History    Date Active Date Inactive Code Status Order ID Comments User Context   07/20/2020 1249 07/21/2020 2044 Full Code [id number]  Collier Bullock, MD ED   Advance Care Planning Activity    Questions for Most Recent Historical Code Status (Order [id number])       Indications/Preliminary Diagnosis: Left upper lobe cavitary lesion, intermittent hemoptysis, mediastinal adenopathy; rule out cancer.  Consent: (Place X beside choice/s below)  The benefits, risks and possible complications of the procedure were        explained to:  _X__ patient  _X__ patient's family (patient's daughter [name])  ___ other:___________  who verbalized understanding and gave:  ___ verbal  ___ written  _X__ verbal and written  ___ telephone  ___ other:________ consent.      Unable to obtain consent; procedure performed on emergent basis.     Other:   Benefits, limitations and potential complications of the procedure were discussed with the patient/family  including, but not limited to bleeding, hemoptysis, respiratory failure requiring intubation and/or prolongued mechanical ventilation, infection, pneumothorax (collapse of lung) requiring chest tube placement, stroke or even death.   Surgeon: Renold Don, MD Assistant/Scrub: Sullivan Lone, RRT Anesthesiologist/CRNA: Vashti Hey, MD/Susan Mare Ferrari, CRNA ROSE available?:  Yes, [facility]   PRESEDATION ASSESSMENT: History and Physical has been performed. Patient meds and allergies have been reviewed. Presedation airway examination has been performed and documented. Baseline vital signs, sedation score, oxygenation  status, and cardiac rhythm were reviewed. Patient was deemed to be in satisfactory condition to undergo the procedure.   Equipment checklist: All necessary equipment available to include fluoroscopy, navigational equipment and endobronchial ultrasound.  TYPE of ANESTHESIA: General endotracheal   Insertion Route (Place X beside choice below)   Nasal   Oral  X Endotracheal Tube   Tracheostomy   INTRAPROCEDURE MEDICATIONS: SEE ANESTHESIOLOGY RECORDS   PROCEDURE DETAILS: Patient was taken to Procedure Room 2 (Bronchoscopy Suite) where appropriate timeout was performed and correct patient, name,ID and laterality was confirmed.  Patient was inducted under general anesthesia by the anesthesia team.  Patient was intubated without sequela, Portex adapter was placed on the ET tube.  Once the patient was under adequate general anesthesia the Olympus video therapeutic bronchoscope was advanced via the existing Portex adapter.  The visible trachea was normal, carina was sharp, the right bronchial tree was examined and no endobronchial lesions were noted.  Mucosa was intact.  There were scant mucoid/thick secretions that were suctioned until cleared.  At this point the bronchoscope was brought to the left bronchial tree and inspection showed that there was a flat parallel sent mass on the entrance of the superior segment of the left upper lobe.  Clinically consistent with squamous cell carcinoma.  At this point it was determined that navigational approach would not be necessary.  The anatomic inspection of the left lower bronchial tree was normal.  At this point the bronchoscope was brought back to the mass noted on the left upper lobe and this was biopsied x8.  The first sample yielded malignant cells by ROSE.  Once biopsies were completed fluoroscopy was utilized to reach the left upper lobe cavitary lesion noted on  CT and not evident by bronchoscopy.  The superior/apical segment of the left upper lobe yielded a  pathway to the cavitary mass visible by fluoroscopy.  Brushings were performed and the brush was cut into CytoLyt material.  Once this was completed bronchial lavage was performed of the left upper lobe approximately 50 mL of saline were instilled yielding 12 mL of aliquot.  Having completed this portion of the procedure the bronchoscope was replaced by the endobronchial ultrasound scope and it was advanced through the existing Portex adapter.  The mediastinum was examined.  There were shoddy precarinal nodes, shotty right hilar nodes that did not appear malignant.  The subcarinal space had a malignant appearing node that was approximately 1.5 cm x 1.2 cm.  This was sampled with a 25-gauge Cook EBUS needle.  ROSE yielded necrotic material and atypical cells.  The remainder passes were placed on CytoLyt.  Total passes 8.  The left hilar area examination showed the previously biopsied mass as above.  Once this was completed the airway was examined and minimal bleeding was noted.  This was lavaged till clear, hemostasis was excellent.  Total blood loss less than 3 mL.  Patient then received 9 mL of lidocaine 1% to the airway prior to withdrawing the bronchoscope.  At this point bronchoscope was removed and the procedure was terminated.  The patient was allowed to emerge from general anesthesia, he was transported to the PACU in satisfactory condition.  Postprocedure chest x-ray showed no pneumothorax.  Patient tolerated well.  SPECIMENS (Sites): (Place X beside choice below)  Specimens Description   No Specimens Obtained     Washings    Lavage LUL  12 ml   Biopsies Bronchial bx X8   Fine Needle Aspirates TBNA (EBUS) X8   Brushings LUL X1   Sputum    Fluoroscopy image:   FINDINGS:   Cavitary mass left upper lobe, visible only with fluoroscopy, no endobronchial component  Mass at the base of the opening of the left upper lobe bronchus, pearlescent, clinically consistent with squamous cell  carcinoma  Subcarinal adenopathy likely metastatic  ESTIMATED BLOOD LOSS: Less than 3 mL. COMPLICATIONS/RESOLUTION: none, postprocedure chest x-ray showed no pneumothorax.  IMPRESSION:POST-PROCEDURE DX:   Cavitary lesion left upper lobe, carcinoma until proven otherwise  Endobronchial lesion left upper lobe, clinically consistent with squamous cell carcinoma, path pending  Apparent metastatic disease in the subcarinal region   RECOMMENDATION/PLAN:   Await path report  Referral made to oncology they will coordinate further imaging/management  Follow-up [facility] on 23rd September  We will notify patient of results as they are available   C. Derrill Kay, MD [facility]   *This note was dictated using voice recognition software/Dragon.  Despite best efforts to proofread, errors can occur which can change the meaning.  Any change was purely unintentional.

## 2020-08-07 NOTE — Transfer of Care (Signed)
Immediate Anesthesia Transfer of Care Note  Patient: Wesley Ferguson  Procedure(s) Performed: VIDEO BRONCHOSCOPY WITH ENDOBRONCHIAL NAVIGATION (N/A ) VIDEO BRONCHOSCOPY WITH ENDOBRONCHIAL ULTRASOUND (N/A )  Patient Location: PACU  Anesthesia Type:General  Level of Consciousness: awake, alert  and oriented  Airway & Oxygen Therapy: Patient Spontanous Breathing and Patient connected to face mask oxygen  Post-op Assessment: Report given to RN and Post -op Vital signs reviewed and stable  Post vital signs: Reviewed and stable  Last Vitals:  Vitals Value Taken Time  BP    Temp    Pulse 73 08/07/20 0915  Resp 24 08/07/20 0915  SpO2 100 % 08/07/20 0915  Vitals shown include unvalidated device data.  Last Pain:  Vitals:   08/07/20 0613  TempSrc: Tympanic  PainSc: 0-No pain         Complications: No complications documented.

## 2020-08-07 NOTE — Interval H&P Note (Signed)
History and Physical Interval Note:  08/07/2020 7:14 AM  Wesley Ferguson  has presented today for surgery, with the diagnosis of LUNG MASS.  The various methods of treatment have been discussed with the patient and family. After consideration of risks, benefits and other options for treatment, the patient has consented to  Procedure(s): VIDEO BRONCHOSCOPY WITH ENDOBRONCHIAL NAVIGATION (N/A) VIDEO BRONCHOSCOPY WITH ENDOBRONCHIAL ULTRASOUND (N/A) as a surgical intervention.  The patient's history has been reviewed, patient examined, no change in status, stable for surgery.  I have reviewed the patient's chart and labs.  Questions were answered to the patient's satisfaction.     Vernard Gambles

## 2020-08-07 NOTE — Discharge Instructions (Signed)

## 2020-08-10 ENCOUNTER — Encounter: Payer: Self-pay | Admitting: Pulmonary Disease

## 2020-08-11 ENCOUNTER — Encounter
Admission: RE | Admit: 2020-08-11 | Discharge: 2020-08-11 | Disposition: A | Discharge status: Home / Self Care | Payer: Medicaid Other | Source: Ambulatory Visit | Attending: Oncology | Admitting: Oncology

## 2020-08-11 ENCOUNTER — Other Ambulatory Visit: Payer: Self-pay

## 2020-08-11 DIAGNOSIS — R918 Other nonspecific abnormal finding of lung field: Secondary | ICD-10-CM | POA: Diagnosis not present

## 2020-08-11 LAB — CYTOLOGY - NON PAP

## 2020-08-11 LAB — SURGICAL PATHOLOGY

## 2020-08-11 LAB — GLUCOSE, CAPILLARY: Glucose-Capillary: 76 mg/dL (ref 70–99)

## 2020-08-11 MED ORDER — FLUDEOXYGLUCOSE F - 18 (FDG) INJECTION
9.4000 | Freq: Once | INTRAVENOUS | Status: AC | PRN
  Administered 2020-08-11: 10.28 via INTRAVENOUS

## 2020-08-13 ENCOUNTER — Other Ambulatory Visit: Payer: Self-pay

## 2020-08-13 NOTE — Progress Notes (Addendum)
Tumor Board Documentation  Wesley Ferguson was presented by Dr Rogue Bussing at our Tumor Board on 08/13/2020, which included representatives from medical oncology, radiation oncology, surgical oncology, internal medicine, navigation, pathology, radiology, surgical, pharmacy, research, palliative care, pulmonology.  Wesley Ferguson currently presents as a new patient, for [place], for new positive pathology with history of the following treatments: active survellience, surgical intervention(s).  Additionally, we reviewed previous medical and familial history, history of present illness, and recent lab results along with all available histopathologic and imaging studies. The tumor board considered available treatment options and made the following recommendations: Concurrent chemo-radiation therapy, Immunotherapy   Will also offer surgical opinion  The following procedures/referrals were also placed: No orders of the defined types were placed in this encounter.   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  Tumor board is a meeting of clinicians from various specialty areas who evaluate and discuss patients for whom a multidisciplinary approach is being considered. Final determinations in the plan of care are those of the provider(s). The responsibility for follow up of recommendations given during tumor board is that of the provider.   Today's extended care, comprehensive team conference, [place] was not present for the discussion and was not examined.   Multidisciplinary Tumor Board is a multidisciplinary case peer review process.  Decisions discussed in the Multidisciplinary Tumor Board reflect the opinions of the specialists present at the conference without having examined the patient.  Ultimately, treatment and diagnostic  decisions rest with the primary provider(s) and the patient.

## 2020-08-13 NOTE — Anesthesia Postprocedure Evaluation (Signed)
Anesthesia Post Note  Patient: Wesley Ferguson  Procedure(s) Performed: VIDEO BRONCHOSCOPY WITH ENDOBRONCHIAL NAVIGATION (N/A ) VIDEO BRONCHOSCOPY WITH ENDOBRONCHIAL ULTRASOUND (N/A )  Patient location during evaluation: PACU Anesthesia Type: General Level of consciousness: awake and alert Pain management: pain level controlled Vital Signs Assessment: post-procedure vital signs reviewed and stable Respiratory status: spontaneous breathing, nonlabored ventilation, respiratory function stable and patient connected to nasal cannula oxygen Cardiovascular status: blood pressure returned to baseline and stable Postop Assessment: no apparent nausea or vomiting Anesthetic complications: no   No complications documented.   Last Vitals:  Vitals:   08/07/20 0948 08/07/20 1005  BP: 130/70 (!) 147/70  Pulse: 70 67  Resp: 20 18  Temp:  (!) 36.3 C  SpO2: 97% 98%    Last Pain:  Vitals:   08/07/20 1005  TempSrc: Tympanic  PainSc: 0-No pain                 Molli Barrows

## 2020-08-14 ENCOUNTER — Other Ambulatory Visit: Payer: Self-pay

## 2020-08-14 ENCOUNTER — Inpatient Hospital Stay: Payer: Medicaid Other

## 2020-08-14 ENCOUNTER — Encounter: Payer: Self-pay | Admitting: Oncology

## 2020-08-14 ENCOUNTER — Telehealth: Payer: Self-pay

## 2020-08-14 ENCOUNTER — Inpatient Hospital Stay: Payer: Medicaid Other | Attending: Oncology | Admitting: Oncology

## 2020-08-14 VITALS — BP 124/74 | HR 75 | Temp 96.6°F | Resp 18 | Wt 186.2 lb

## 2020-08-14 DIAGNOSIS — J449 Chronic obstructive pulmonary disease, unspecified: Secondary | ICD-10-CM | POA: Insufficient documentation

## 2020-08-14 DIAGNOSIS — C3412 Malignant neoplasm of upper lobe, left bronchus or lung: Secondary | ICD-10-CM | POA: Insufficient documentation

## 2020-08-14 DIAGNOSIS — F1020 Alcohol dependence, uncomplicated: Secondary | ICD-10-CM | POA: Diagnosis not present

## 2020-08-14 DIAGNOSIS — F17210 Nicotine dependence, cigarettes, uncomplicated: Secondary | ICD-10-CM | POA: Diagnosis not present

## 2020-08-14 DIAGNOSIS — C3490 Malignant neoplasm of unspecified part of unspecified bronchus or lung: Secondary | ICD-10-CM | POA: Insufficient documentation

## 2020-08-14 DIAGNOSIS — Z801 Family history of malignant neoplasm of trachea, bronchus and lung: Secondary | ICD-10-CM | POA: Diagnosis not present

## 2020-08-14 DIAGNOSIS — Z7189 Other specified counseling: Secondary | ICD-10-CM | POA: Insufficient documentation

## 2020-08-14 NOTE — Telephone Encounter (Signed)
Per Dr.Oaks would like to scheduled patient for Monday August 23rd at 11:00am due to Dr.Oaks assisting with Dr.Cannon in surgery. Dr.Oaks states Dr.Rao was referring the patient to our office. Spoke with patient to make aware of the upcoming appointment. Patient verbalized understanding and has no further questions.

## 2020-08-14 NOTE — Telephone Encounter (Signed)
-----   Message from Nestor Lewandowsky, MD sent at 08/14/2020  9:55 AM EDT ----- Could I please see this patient next week.  Left Upper Lobe mass.  Dr. Randa Evens is referring.  Can I see him on Monday????  Thanks.

## 2020-08-14 NOTE — Progress Notes (Signed)
Hematology/Oncology Consult note [facility] [phone] [phone]  Patient Care Team: Patient, No Pcp Per as PCP - General ([place]) Telford Nab, RN as Oncology Nurse Navigator   Name of the patient: Wesley Ferguson  [id number]  [birth year]    Reason for referral-new diagnosis of lung cancer   Referring physician-Dr. Patsey Berthold  Date of visit: 08/14/20   History of presenting illness- Patient is a 54 year old male who was admitted to the hospital in July 2021 with symptoms of chest pain and streaky hemoptysis.  He had a chest x-ray followed by a CT scan which showed a 5.7 x 4.4 cm left upper lobe mass along with left hilar adenopathy.  There was a low-density 3.6 lesion noted in the right hepatic lobe which ultimately turned out to be a hemangioma on MRI liver.  PET CT scan showed hypermetabolic activity in the left upper lobe and left hilar nodal tissue.  Subcarinal and right paratracheal lymph nodes with mild hypermetabolic features.  Patient underwent bronchoscopies and biopsies.Left upper lobe biopsy was consistent with non-small cell carcinoma favor squamous cell carcinoma right subcarinal lymph node biopsy was negative for malignancy.  Patient is a chronic smoker and smokes 2 pack of cigarettes per day for over 35 years.  He drinks about 4-5 beers every day and sometimes more also for the last 30 years.He lives with his mother.  He does not have any other known past medical problems other than recently diagnosed COPD.  He is not on any home oxygen.  Patient reports that he can climb a flight of stairs without feeling overt shortness of breath.  Denies any pain.  Appetite and weight have remained stable.  Currently reports occasional cough.  Hemoptysis is resolved  ECOG PS- 0  Pain scale- 0   Review of systems- Review of Systems  Constitutional: Negative for chills, fever, malaise/fatigue and weight loss.  HENT: Negative for  congestion, ear discharge and nosebleeds.   Eyes: Negative for blurred vision.  Respiratory: Positive for cough. Negative for hemoptysis, sputum production, shortness of breath and wheezing.   Cardiovascular: Negative for chest pain, palpitations, orthopnea and claudication.  Gastrointestinal: Negative for abdominal pain, blood in stool, constipation, diarrhea, heartburn, melena, nausea and vomiting.  Genitourinary: Negative for dysuria, flank pain, frequency, hematuria and urgency.  Musculoskeletal: Negative for back pain, joint pain and myalgias.  Skin: Negative for rash.  Neurological: Negative for dizziness, tingling, focal weakness, seizures, weakness and headaches.  Endo/Heme/Allergies: Does not bruise/bleed easily.  Psychiatric/Behavioral: Negative for depression and suicidal ideas. The patient does not have insomnia.     No Known Allergies  Patient Active Problem List   Diagnosis Date Noted  . 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 Nicotine dependence 07/20/2020  . Cavitating mass in left upper lung lobe 07/20/2020  . Hyponatremia 07/20/2020  . Postobstructive pneumonia 07/20/2020     Past Medical History:  Diagnosis Date  . COPD (chronic obstructive pulmonary disease) ([place])   . Emphysema of lung ([place])   . Pneumonia      Past Surgical History:  Procedure Laterality Date  . BACK SURGERY  2017   lumbar region herniated disc  . VIDEO BRONCHOSCOPY WITH ENDOBRONCHIAL NAVIGATION N/A 08/07/2020   Procedure: VIDEO BRONCHOSCOPY WITH ENDOBRONCHIAL NAVIGATION;  Surgeon: Tyler Pita, MD;  Location: [facility] ORS;  Service: Pulmonary;  Laterality: N/A;  . VIDEO BRONCHOSCOPY WITH ENDOBRONCHIAL ULTRASOUND N/A 08/07/2020   Procedure: VIDEO BRONCHOSCOPY WITH ENDOBRONCHIAL ULTRASOUND;  Surgeon:  Tyler Pita, MD;  Location: [facility] ORS;  Service: Pulmonary;  Laterality: N/A;    Social History   Socioeconomic History  . Marital status: Divorced    Spouse name:  Not on file  . Number of children: Not on file  . Years of education: Not on file  . Highest education level: Not on file  Occupational History  . Not on file  Tobacco Use  . Smoking status: Current Every Day Smoker    Packs/day: 2.00    Years: 35.00    Pack years: 70.00    Types: Cigarettes  . Smokeless tobacco: Former Systems developer    Types: Chew  . Tobacco comment: was smoking 3 ppd but down to 2 pack per day--07/29/2020  Vaping Use  . Vaping Use: Never used  Substance and Sexual Activity  . Alcohol use: Yes    Alcohol/week: 14.0 standard drinks    Types: 14 Cans of beer per week  . Drug use: No  . Sexual activity: Not on file  Other Topics Concern  . Not on file  Social History Narrative  . Not on file   Social Determinants of Health   Financial Resource Strain:   . Difficulty of Paying Living Expenses: Not on file  Food Insecurity:   . Worried About Charity fundraiser in the Last Year: Not on file  . Ran Out of Food in the Last Year: Not on file  Transportation Needs:   . Lack of Transportation (Medical): Not on file  . Lack of Transportation (Non-Medical): Not on file  Physical Activity:   . Days of Exercise per Week: Not on file  . Minutes of Exercise per Session: Not on file  Stress:   . Feeling of Stress : Not on file  Social Connections:   . Frequency of Communication with Friends and Family: Not on file  . Frequency of Social Gatherings with Friends and Family: Not on file  . Attends Religious Services: Not on file  . Active Member of Clubs or Organizations: Not on file  . Attends Archivist Meetings: Not on file  . Marital Status: Not on file  Intimate Partner Violence:   . Fear of Current or Ex-Partner: Not on file  . Emotionally Abused: Not on file  . Physically Abused: Not on file  . Sexually Abused: Not on file     Family History  Problem Relation Age of Onset  . Diabetes Father   . Lung cancer Father   . Heart attack Father       Current Outpatient Medications:  .  acetaminophen (TYLENOL) 500 MG tablet, Take 500-1,000 mg by mouth every 6 (six) hours as needed (for pain.)., Disp: , Rfl:  .  Tiotropium Bromide-Olodaterol (STIOLTO RESPIMAT) 2.5-2.5 MCG/ACT AERS, Inhale 2 puffs into the lungs daily., Disp: , Rfl:    Physical exam:  Vitals:   08/14/20 0939  BP: 124/74  Pulse: 75  Resp: 18  Temp: (!) 96.6 F (35.9 C)  TempSrc: Tympanic  SpO2: 100%  Weight: 186 lb 3.2 oz (84.5 kg)   Physical Exam Constitutional:      General: He is not in acute distress. Cardiovascular:     Rate and Rhythm: Normal rate and regular rhythm.     Heart sounds: Normal heart sounds.  Pulmonary:     Effort: Pulmonary effort is normal.     Breath sounds: Normal breath sounds.  Abdominal:     General: Bowel sounds are normal.  Palpations: Abdomen is soft.  Skin:    General: Skin is warm and dry.  Neurological:     Mental Status: He is alert and oriented to person, place, and time.        CMP Latest Ref Rng & Units 07/21/2020  Glucose 70 - 99 mg/dL 94  BUN 6 - 20 mg/dL 7  Creatinine 0.61 - 1.24 mg/dL 0.72  Sodium 135 - 145 mmol/L 134(L)  Potassium 3.5 - 5.1 mmol/L 4.6  Chloride 98 - 111 mmol/L 102  CO2 22 - 32 mmol/L 28  Calcium 8.9 - 10.3 mg/dL 9.0  Total Protein 6.5 - 8.1 g/dL -  Total Bilirubin 0.3 - 1.2 mg/dL -  Alkaline Phos 38 - 126 U/L -  AST 15 - 41 U/L -  ALT 0 - 44 U/L -   CBC Latest Ref Rng & Units 07/21/2020  WBC 4.0 - 10.5 K/uL 10.0  Hemoglobin 13.0 - 17.0 g/dL 12.1(L)  Hematocrit 39 - 52 % 35.5(L)  Platelets 150 - 400 K/uL 402(H)    No images are attached to the encounter.  DG Chest 2 View  Addendum Date: 07/20/2020   ADDENDUM REPORT: 07/20/2020 11:02 ADDENDUM: Upper lung opacity appears cavitary. Electronically Signed   By: Macy Mis M.D.   On: 07/20/2020 11:02   Result Date: 07/20/2020 CLINICAL DATA:  Cough, left rib pain EXAM: CHEST - 2 VIEW COMPARISON:  2015 FINDINGS: Patchy  left lung opacities. Background changes of COPD. No pleural effusion or pneumothorax. Stable cardiomediastinal contours with normal heart size. No acute osseous abnormality. IMPRESSION: Patchy left lung opacities, which may reflect pneumonia in the appropriate setting. Follow-up is recommended after treatment. Consider chest CT if there is concern for malignancy. Electronically Signed: By: Macy Mis M.D. On: 07/20/2020 10:25   CT Chest W Contrast  Result Date: 07/20/2020 CLINICAL DATA:  Chest pain, hemoptysis. EXAM: CT CHEST WITH CONTRAST TECHNIQUE: Multidetector CT imaging of the chest was performed during intravenous contrast administration. CONTRAST:  78mL OMNIPAQUE IOHEXOL 300 MG/ML  SOLN COMPARISON:  Radiograph of same day. FINDINGS: Cardiovascular: No significant vascular findings. Normal heart size. No pericardial effusion. Mediastinum/Nodes: Thyroid gland and esophagus are unremarkable. Subcarinal adenopathy is noted with the largest lymph node measuring 12 mm. Left hilar adenopathy is noted measuring 2.2 cm. Lungs/Pleura: No pneumothorax or pleural effusion is noted. Mild emphysematous disease is noted throughout both lungs. 5.7 x 4.4 cm cavitating rounded abnormality is noted in the left upper lobe highly concerning for necrotic malignancy, or less likely cavitary pneumonia. Airspace opacity is noted in lingular segment of left upper lobe concerning for pneumonia. Upper Abdomen: 3.6 x 2.5 cm ill-defined low density is noted peripherally in posterior segment of right hepatic lobe. Musculoskeletal: No chest wall abnormality. No acute or significant osseous findings. IMPRESSION: 1. 5.7 x 4.4 cm cavitating rounded abnormality is noted in the left upper lobe highly concerning for necrotic malignancy, or less likely cavitary pneumonia. 2. Airspace opacity is noted in lingular segment of left upper lobe concerning for pneumonia. 3. Subcarinal and left hilar adenopathy is noted concerning for metastatic  disease, or possibly inflammatory in etiology. 4. 3.6 x 2.5 cm ill-defined low density is noted peripherally in posterior segment of right hepatic lobe. This is concerning for possible neoplasm or metastatic disease. MRI of the liver with and without gadolinium administration is recommended for further evaluation. Emphysema (ICD10-J43.9). Electronically Signed   By: Marijo Conception M.D.   On: 07/20/2020 11:11   MR LIVER W  WO CONTRAST  Result Date: 07/20/2020 CLINICAL DATA:  Evaluate liver lesions seen on recent CT scan. Known large cavitary left upper lobe lung mass and left hilar adenopathy. EXAM: MRI ABDOMEN WITHOUT AND WITH CONTRAST TECHNIQUE: Multiplanar multisequence MR imaging of the abdomen was performed both before and after the administration of intravenous contrast. CONTRAST:  25mL GADAVIST GADOBUTROL 1 MMOL/ML IV SOLN COMPARISON:  CT scan 06/20/2020 FINDINGS: Lower chest: The lingular atelectasis is noted. No lower pulmonary lesions are identified. No pleural or pericardial effusion. Borderline enlarged epicardial lymph nodes are noted. Hepatobiliary: There are 5 liver lesions. 14 mm lesion in segment 2, 14 mm lesion in segment 3, 13.5 mm lesion in segment 8 and 2 adjacent lesions in segment 6. The larger lesion measures 17 mm in the smaller lesion 11.5 mm. All of these lesions demonstrate high T2 signal intensity, peripheral nodular enhancement and complete or near complete filling in on delayed images. The lesions have the same signal intensity as the blood pool on the delayed images. These are consistent with benign hepatic hemangiomas. I do not see any worrisome hepatic lesions to suggest hepatic metastatic disease. The gallbladder is unremarkable. No intra or extrahepatic biliary dilatation. Pancreas:  Normal. Spleen:  Normal size.  No focal lesions. Adrenals/Urinary Tract: The adrenal glands are normal. No adrenal gland metastasis are identified. Both kidneys are normal. Stomach/Bowel: The  stomach, duodenum, visualized small bowel and visualized colon are unremarkable. No acute inflammatory changes or mass lesions. No obstructive findings. Vascular/Lymphatic: The aorta and branch vessels are normal. The major venous structures are patent. No mesenteric or retroperitoneal mass or adenopathy. Other:  No ascites or abdominal wall hernia. Musculoskeletal: No significant bony findings. Small hemangioma noted in L1. IMPRESSION: 1. 5 liver lesions are consistent with benign hepatic hemangiomas. No worrisome hepatic lesions to suggest hepatic metastatic disease. 2. No acute abdominal findings or adenopathy. 3. Borderline enlarged epicardial lymph nodes are noted. Electronically Signed   By: Marijo Sanes M.D.   On: 07/20/2020 13:54   NM PET Image Initial (PI) Skull Base To Thigh  Result Date: 08/11/2020 CLINICAL DATA:  Initial treatment strategy for cavitary lung mass. EXAM: NUCLEAR MEDICINE PET SKULL BASE TO THIGH TECHNIQUE: 10.28 mCi F-18 FDG was injected intravenously. Full-ring PET imaging was performed from the skull base to thigh after the radiotracer. CT data was obtained and used for attenuation correction and anatomic localization. Fasting blood glucose: 76 mg/dl COMPARISON:  CT of the chest of July 20, 2020 FINDINGS: Mediastinal blood pool activity: SUV max 1.96 Liver activity: SUV max NA NECK: No hypermetabolic lymph nodes in the neck. Incidental CT findings: none CHEST: Cavitary mass in the LEFT upper lobe with thick wall and small satellite lesion. Area measures approximately 5.5 x 5.2 cm, when measured in a similar fashion on the prior study is within 1-2 mm of its previous measurement. Wall thickness approximately 16 mm along the medial wall is the area of greatest thickness. Adjacent nodule (image 101, series 3) 0.8 cm similar to the previous exam. Persistent consolidative change in the lingula but with improved aeration and diminished peripheral nodularity when compared to the prior  study. Maximum SUV of LEFT upper lobe, dominant mass 12.8. The small nodule in the LEFT upper lobe just inferior to the dominant mass with a maximum SUV of 3.6. The other small nodules bland into the inferior aspect of the dominant mass but are grossly unchanged compared to the previous study. High RIGHT paratracheal lymph node is the largest node  in the chest aside from LEFT hilar nodal tissue (image 75, series 3) approximately 9 mm (SUVmax = 2.81) Intensely hypermetabolic LEFT hilar nodal tissue (image 108, series 3) (SUVmax = 15.6) this is difficult to evaluate in terms of size but is approximately 2.2 cm largely stable compared to the prior exam. Mildly enlarged subcarinal nodal tissue with maximum SUV of 2.9. LEFT suprahilar nodal tissue (image 104, series 3) (SUVmax = 4.9) difficult to measure and is contiguous with soft tissue about the LEFT hilum on the prior study. Subcentimeter substernal, anterior mediastinal lymph nodes along the under surface of the sternum, sternal manubrium (image 81, series 3) FDG uptake less than mediastinal blood pool. Incidental CT findings: Normal caliber thoracic aorta. No pericardial effusion. The small pre pericardial lymph nodes less than a cm without hypermetabolic features. ABDOMEN/PELVIS: No abnormal hypermetabolic activity within the liver, pancreas, adrenal glands, or spleen. No hypermetabolic lymph nodes in the abdomen or pelvis. Signs of hepatic hemangiomata as on previous exams. No FDG uptake in these areas. Adrenal glands are normal. Incidental CT findings: Liver and gallbladder without suspicious finding, previously characterized hepatic lesions as above. No peripancreatic stranding. Spleen normal size and contour. Adrenal glands with normal CT appearance. No hydronephrosis or nephrolithiasis. No acute bowel process. Incipient herniation of the appendix into the RIGHT inguinal canal. Scattered calcified atheromatous changes of the abdominal aorta extending in the  iliac vessels. SKELETON: No focal hypermetabolic activity to suggest skeletal metastasis. Incidental CT findings: none IMPRESSION: 1. Intensely hypermetabolic cavitary thick-walled LEFT upper lobe mass with associated hypermetabolic LEFT hilar nodal tissue most suspicious for bronchogenic neoplasm with nodal involvement. 2. Subcarinal and RIGHT paratracheal lymph nodes also display mildly hypermetabolic features though not to the extent that is seen in the LEFT hilum. These remain suspicious for additional sites of disease. 3. Adjacent small satellite nodule also with increased metabolic activity as described. 4. Improved presumed postobstructive process in the chest. Attention on follow-up. Aortic Atherosclerosis (ICD10-I70.0) and Emphysema (ICD10-J43.9). Electronically Signed   By: Zetta Bills M.D.   On: 08/11/2020 13:49   US Venous Img Upper Uni Right(DVT)  Result Date: 07/20/2020 CLINICAL DATA:  Right upper extremity swelling EXAM: RIGHT UPPER EXTREMITY VENOUS DOPPLER ULTRASOUND TECHNIQUE: Gray-scale sonography with graded compression, as well as color Doppler and duplex ultrasound were performed to evaluate the upper extremity deep venous system from the level of the subclavian vein and including the jugular, axillary, basilic, radial, ulnar and upper cephalic vein. Spectral Doppler was utilized to evaluate flow at rest and with distal augmentation maneuvers. COMPARISON:  None. FINDINGS: Contralateral Subclavian Vein: Respiratory phasicity is normal and symmetric with the symptomatic side. No evidence of thrombus. Normal compressibility. Internal Jugular Vein: No evidence of thrombus. Normal compressibility, respiratory phasicity and response to augmentation. Subclavian Vein: No evidence of thrombus. Normal compressibility, respiratory phasicity and response to augmentation. Axillary Vein: No evidence of thrombus. Normal compressibility, respiratory phasicity and response to augmentation. Cephalic Vein:  No evidence of thrombus. Normal compressibility, respiratory phasicity and response to augmentation. Basilic Vein: No evidence of thrombus. Normal compressibility, respiratory phasicity and response to augmentation. Brachial Veins: No evidence of thrombus. Normal compressibility, respiratory phasicity and response to augmentation. Radial Veins: No evidence of thrombus. Normal compressibility, respiratory phasicity and response to augmentation. Ulnar Veins: No evidence of thrombus. Normal compressibility, respiratory phasicity and response to augmentation. Venous Reflux:  None visualized. Other Findings:  None visualized. IMPRESSION: No evidence of DVT within the right upper extremity. Electronically Signed   By: Lennette Bihari  [place] M.D.   On: 07/20/2020 12:01   DG Chest Port 1 View  Result Date: 08/07/2020 CLINICAL DATA:  Status post left lung biopsy EXAM: PORTABLE CHEST 1 VIEW COMPARISON:  July 20, 2020 FINDINGS: No appreciable pneumothorax. Cavitary lesion again noted in left upper lobe measuring 5.2 x 4.6 cm. Patchy airspace opacity in the left mid lung is stable. No new opacity evident. Heart size and pulmonary vascularity are normal. No adenopathy. No bone lesions. IMPRESSION: No pneumothorax. Cavitary lesion again noted left upper lobe. Patchy airspace opacity left mid lung stable. No new opacity. Stable cardiac silhouette. No adenopathy appreciable. Electronically Signed   By: Lowella Grip III M.D.   On: 08/07/2020 09:43   DG C-Arm 1-60 Min-No Report  Result Date: 08/07/2020 Fluoroscopy was utilized by the requesting physician.  No radiographic interpretation.    Assessment and plan- Patient is a 54 y.o. 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 He is here to discuss further management  I have reviewed CT chest at as well as PET/CT scan images independently and discussed findings with the patient. Also discussed results of pathology  with the patient in detail.   Patient known to have a 5.5 cm left upper lobe lung mass along with ipsilateral left hilar adenopathy. We reviewed his images and discussed at tumor board yesterday as well. The right paratracheal as well as left subcarinal adenopathy noted on the recent CT chest appear smaller on the subsequent PET and may be reactive in the setting of recent pneumonia. His right subcarinal node was sampled and was negative for malignancy. He therefore likely has N1 disease. Patient is relatively young and does not have any significant comorbidities. I discussed both surgical and nonsurgical options for him.  1. Surgical option: I will refer him to Dr. Genevive Bi to get a surgical opinion to see if definitive surgery is an option. I will obtain lung function tests as well. If he is deemed to be a candidate for surgery neoadjuvant chemotherapy prior to surgery is a consideration. If he does not get neoadjuvant chemotherapy he will get adjuvant chemotherapy regardless.  2. Nonsurgical option: Proceed with definitive concurrent chemoradiation with weekly carbotaxol chemotherapy with 7 weeks of radiation treatment followed by repeat scans. If he has stable to partial response the next step would be maintenance immunotherapy every 2 weeks for 1 year. Discussed risks and benefits of chemotherapy including all but not limited to nausea, vomiting, low blood counts, hair loss as well as risk of infections and hospitalizations. Treatment will be given with a curative intent.  Patient would like to see Dr. Genevive Bi as well as Dr. Baruch Gouty and get both their opinions before deciding surgical versus nonsurgical approach.  I have recommended that patient should take his Covid vaccine given his underlying COPD and new diagnosis of lung cancer. Patient would like to think about it. Also counseled him to stop smoking and cut down his use of alcohol  Cancer Staging Malignant neoplasm of unspecified part of  unspecified bronchus or lung [facility]) Staging form: Lung, AJCC 8th Edition - Clinical stage from 08/14/2020: Stage IIIA (cT3, cN1, cM0) - Signed by Sindy Guadeloupe, MD on 08/14/2020     Thank you for this kind referral and the opportunity to participate in the care of this patient   Visit Diagnosis 1. Malignant neoplasm of upper lobe of left lung ([place])   2. Goals of care, counseling/discussion  Dr. Randa Evens, MD, MPH [facility] [phone] 08/14/2020 12:52 PM

## 2020-08-17 ENCOUNTER — Encounter: Payer: Self-pay | Admitting: Cardiothoracic Surgery

## 2020-08-17 ENCOUNTER — Other Ambulatory Visit: Payer: Self-pay

## 2020-08-17 ENCOUNTER — Ambulatory Visit (INDEPENDENT_AMBULATORY_CARE_PROVIDER_SITE_OTHER): Payer: Self-pay | Admitting: Cardiothoracic Surgery

## 2020-08-17 VITALS — BP 141/81 | HR 70 | Temp 98.4°F | Ht 74.0 in | Wt 186.0 lb

## 2020-08-17 DIAGNOSIS — Z029 Encounter for administrative examinations, unspecified: Secondary | ICD-10-CM

## 2020-08-17 DIAGNOSIS — C3412 Malignant neoplasm of upper lobe, left bronchus or lung: Secondary | ICD-10-CM

## 2020-08-17 NOTE — Progress Notes (Signed)
Patient ID: Wesley Ferguson, male   DOB: October 12, 1966, 54 y.o.   MRN: [id number]  Chief Complaint  Patient presents with  . New Patient (Initial Visit)    Left lung mass    Referred By Dr. Randa Evens Reason for Referral left upper lobe squamous cell carcinoma  HPI Location, Quality, Duration, Severity, Timing, Context, Modifying Factors, Associated Signs and Symptoms.  Wesley Ferguson is a 54 y.o. male.  His problems began about 1 month ago when he started coughing up some blood.  He was admitted to the hospital with a left upper lobe pneumonia.  CT scan at that time revealed an approximately 4 cm left upper lobe cavitary lesion as well as an associated left upper lobe pneumonia.  He underwent bronchoscopy and biopsy of a subcarinal lymph node.  The subcarinal lymph node was negative but the biopsies of the left upper lobe were positive for squamous cell carcinoma.  A PET scan showed several hilar lymph nodes to be positive.  The left upper lobe pneumonia was significantly improved.  At present time is unclear of the exact stage but there are multiple lymph nodes that are positive in the hilum on PET of whether or not these are related to infection or to his left upper lobe mass is unclear.  The patient smokes at least 2 to 3 packs cigarettes a day.  He states that on the weekends when he is drinking more he can smoke more than that.  He averages approximately 10 alcoholic drinks per night.  On the weekends he drinks more.  He is currently unemployed.  He worked in Multimedia programmer.  He does get short of breath with minimal activities.  He states that he tried to help to wash her car over the weekend and just was unable to do it because of his shortness of breath.  He thinks he can walk up and down steps without any significant limitations.  He is scheduled to see Dr. Donella Stade later this week.  He is scheduled to have an MRI and complete set of pulmonary function test.  He is accompanied today by  his daughter.   Past Medical History:  Diagnosis Date  . COPD (chronic obstructive pulmonary disease) ([place])   . Emphysema of lung ([place])   . Pneumonia     Past Surgical History:  Procedure Laterality Date  . BACK SURGERY  2017   lumbar region herniated disc  . VIDEO BRONCHOSCOPY WITH ENDOBRONCHIAL NAVIGATION N/A 08/07/2020   Procedure: VIDEO BRONCHOSCOPY WITH ENDOBRONCHIAL NAVIGATION;  Surgeon: Tyler Pita, MD;  Location: [facility] ORS;  Service: Pulmonary;  Laterality: N/A;  . VIDEO BRONCHOSCOPY WITH ENDOBRONCHIAL ULTRASOUND N/A 08/07/2020   Procedure: VIDEO BRONCHOSCOPY WITH ENDOBRONCHIAL ULTRASOUND;  Surgeon: Tyler Pita, MD;  Location: [facility] ORS;  Service: Pulmonary;  Laterality: N/A;    Family History  Problem Relation Age of Onset  . Diabetes Father   . Lung cancer Father 61  . Heart attack Father   . Throat cancer Maternal Aunt     Social History Social History   Tobacco Use  . Smoking status: Current Every Day Smoker    Packs/day: 2.00    Years: 35.00    Pack years: 70.00    Types: Cigarettes  . Smokeless tobacco: Former Systems developer    Types: Chew  . Tobacco comment: was smoking 3 ppd but down to 2 pack per day--07/29/2020  Vaping Use  . Vaping Use: Never used  Substance Use Topics  .  Alcohol use: Yes    Alcohol/week: 14.0 standard drinks    Types: 14 Cans of beer per week  . Drug use: No    No Known Allergies  Current Outpatient Medications  Medication Sig Dispense Refill  . acetaminophen (TYLENOL) 500 MG tablet Take 500-1,000 mg by mouth every 6 (six) hours as needed (for pain.).    [name] Tiotropium Bromide-Olodaterol (STIOLTO RESPIMAT) 2.5-2.5 MCG/ACT AERS Inhale 2 puffs into the lungs daily.     No current facility-administered medications for this visit.      Review of Systems A complete review of systems was asked and was negative except for the following positive findings hearing loss, ringing in the ears, cough, shortness of breath  Blood  pressure (!) 141/81, pulse 70, temperature 98.4 F (36.9 C), height 6\' 2"  (1.88 m), weight 186 lb (84.4 kg), SpO2 95 %.  Physical Exam CONSTITUTIONAL:  Pleasant, well-developed, well-nourished, and in no acute distress. EYES: Pupils equal and reactive to light, Sclera non-icteric EARS, NOSE, MOUTH AND THROAT:  The oropharynx was clear.  Dentition is absent repair.  Oral mucosa pink and moist. LYMPH NODES:  Lymph nodes in the neck and axillae were normal RESPIRATORY:  Lungs were clear but very distant.  Normal respiratory effort without pathologic use of accessory muscles of respiration CARDIOVASCULAR: Heart was regular without murmurs.  There were no carotid bruits. GI: The abdomen was soft, nontender, and nondistended. There were no palpable masses. There was no hepatosplenomegaly. There were normal bowel sounds in all quadrants. GU:  Rectal deferred.   MUSCULOSKELETAL:  Normal muscle strength and tone.  No clubbing or cyanosis.   SKIN:  There were no pathologic skin lesions.  There were no nodules on palpation. NEUROLOGIC:  Sensation is normal.  Cranial nerves are grossly intact. PSYCH:  Oriented to person, place and time.  Mood and affect are normal.  Data Reviewed Chest x-rays, CT scan and PET scan  I have personally reviewed the patient's imaging, laboratory findings and medical records.    Assessment    Left upper lobe mass with multiple hilar lymph nodes that are positive by PET scanning    Plan    I had a long discussion today with the patient and his daughter.  I reviewed with them the indications and risks of left upper lobectomy.  Risks of bleeding, infection, air leak and death were all reviewed.  I also discussed with him the role of smoking cessation as well as Covid vaccination prior to any surgical intervention.  I reviewed with him the uncertainty regarding the exact stage of his disease because of the presence of his pneumonia.  I told him that at this point in time  we have 3 options.  One would be to proceed with surgery now and assess the intraoperative stations and await final pathology.  I also discussed with him the role of preoperative chemotherapy.  I told him that in general we do not typically treat patients with preoperative chemotherapy alone but in his case it would allow [place] time for smoking cessation programs to be instituted and for him to undergo Covid vaccination.  Finally I discussed the nonoperative role that radiation therapy and chemotherapy combined may play for his disease.  He understands the implications of all of these and he and his daughter would like to think about it.  I encouraged them to write down their questions and they meet with Dr. Donella Stade so that he can help them come to a better understanding  of how best to proceed.  I gave patient and his daughter my business card and told him to contact me if they would like to come back to discuss any further management.    Nestor Lewandowsky, MD 08/17/2020, 11:55 AM

## 2020-08-17 NOTE — Patient Instructions (Addendum)
We spoke about trying to reduce and quit smoking prior to surgery. If you like we can refer you to the smoking cessation program with [facility]. Please let us know about this.   We have spoken about having a lobectomy and the risks involved.  We have spoken about having chemotherapy prior to surgery with a scan after several treatments to reassess the area and then have surgery. We also spoke about just having chemotherapy and radiation therapy to treat the tumor.   Please call us and let us know if you would like to come back for a follow up to discuss this.     Lung Resection  A lung resection is a procedure to remove part or all of a lung. An entire lung may be removed (pneumonectomy), or only part of it may be removed (lobectomy). A lung resection may be done as an open surgery or as a minimally invasive surgery. A lung resection is most often done to remove a tumor or cancer, but it may be done to treat other conditions. The procedure can relieve symptoms and keep the problem from getting worse. Tell a health care provider about:  Any allergies you have.  All medicines you are taking, including vitamins, herbs, eye drops, creams, and over-the-counter medicines.  Any problems you or family members have had with anesthetic medicines.  Any blood disorders you have.  Any surgeries you have had.  Any medical conditions you have.  Whether you are pregnant or may be pregnant. What are the risks? Generally, this is a safe procedure. However, problems may occur, including:  Excessive bleeding.  Infection.  Reaction to anesthesia.  Allergic reaction to medicines.  Blood clots.  Injury to a nerve or blood vessel.  Problems breathing.  Heart problems.  Stroke. What happens before the procedure? Staying hydrated Follow instructions from your health care provider about hydration, which may include:  Up to 2 hours before the procedure - you may continue to drink clear liquids,  such as water, clear fruit juice, black coffee, and plain tea. Eating and drinking restrictions Follow instructions from your health care provider about eating and drinking, which may include:  8 hours before the procedure - stop eating heavy meals or foods such as meat, fried foods, or fatty foods.  6 hours before the procedure - stop eating light meals or foods, such as toast or cereal.  6 hours before the procedure - stop drinking milk or drinks that contain milk.  2 hours before the procedure - stop drinking clear liquids. Medicines Ask your health care provider about:  Changing or stopping your regular medicines. This is especially important if you are taking diabetes medicines or blood thinners.  Taking medicines such as aspirin and ibuprofen. These medicines can thin your blood. Do not take these medicines unless your health care provider tells you to take them.  Taking over-the-counter medicines, vitamins, herbs, and supplements. Tests You may have tests done before the procedure, including:  Blood and urine tests.  Imaging tests, such as X-rays, CT, MRI, or PET scans.  Bronchoscopy. In this procedure, a health care provider uses a flexible tube (bronchoscope) to look at the inside of your airways.  Pulmonary function tests. These are done to check how well your lungs work.  Heart testing. This is done to check heart function before the procedure.  Lymph node sampling. This may be done to see if you have a tumor that has spread. General instructions  Plan to have someone  take you home from the hospital or clinic.  Plan to have a responsible adult care for you for at least 24 hours after you leave the hospital or clinic. This is important.  Do not use any products that contain nicotine or tobacco for as long as possible before your procedure. These include cigarettes and e-cigarettes. If you need help quitting, ask your health care provider.  Ask your health care  provider what steps will be taken to help prevent infection. These may include: ? Removing hair at the surgery site. ? Washing skin with a germ-killing soap. ? Taking antibiotic medicine. What happens during the procedure?  An IV will be inserted into one of your veins. You will be given one or both of the following: ? A medicine to help you relax (sedative). ? A medicine to make you fall asleep (general anesthetic).  A breathing tube will be placed in your throat.  A thin tube (catheter) may be inserted into the part of your body that drains urine from the bladder (urethra). The catheter will drain your urine.  Your health care provider will make a large incision on your side (open lung surgery) or several small incisions over your chest area (minimally invasive surgery).  Your health care provider will carefully cut any tissues leading to the area of the lung being treated.  The lung or part of the lung will then be removed. Lymph nodes near the lung may also be removed for testing.  Your health care provider will check inside your chest to make sure there is no bleeding in or around the lungs.  Your health care provider may put tubes into your chest to drain extra fluid and air after surgery.  Your incisions will be closed. This may be done using stitches (sutures), staples, skin glue, or skin tape (adhesive) strips.  A bandage (dressing) may be placed over your incisions. The procedure may vary among health care providers and hospitals. What happens after the procedure?  Your blood pressure, heart rate, breathing rate, and blood oxygen level will be monitored until you leave the hospital or clinic.  Right after surgery, you may: ? Be moved to the intensive care unit (ICU). ? Continue to have a tube to help you breathe or have a urinary catheter. ? Have an IV for fluids and medicines. ? Remain on a respirator, if assistance is needed to help you breathe. ? Start respiratory  therapy in the ICU. This will help your other lung to get strong and stay healthy. ? Be given medicine to help with pain and nausea.  You may have to wear compression stockings. These stockings help to prevent blood clots and reduce swelling in your legs.  As you continue to recover: ? You will be moved to a regular hospital room. Therapy will continue. ? You may be released to go home or to an extended care facility. Summary  A lung resection is a procedure to remove part or all of a lung. It can be done as an open surgery or a minimally invasive surgery.  A lung resection is most often done to remove a tumor or cancer, but it may be done to treat other conditions.  After surgery, respiratory therapy will be prescribed to help your lung recover and become stronger. This information is not intended to replace advice given to you by your health care provider. Make sure you discuss any questions you have with your health care provider. Document Revised: 12/25/2017 Document Reviewed:  12/25/2017 Elsevier Patient Education  [place].

## 2020-08-18 ENCOUNTER — Other Ambulatory Visit
Admission: RE | Admit: 2020-08-18 | Discharge: 2020-08-18 | Disposition: A | Discharge status: Home / Self Care | Payer: Medicaid Other | Source: Ambulatory Visit | Attending: Pulmonary Disease | Admitting: Pulmonary Disease

## 2020-08-18 DIAGNOSIS — Z01812 Encounter for preprocedural laboratory examination: Secondary | ICD-10-CM | POA: Diagnosis not present

## 2020-08-18 DIAGNOSIS — Z20822 Contact with and (suspected) exposure to covid-19: Secondary | ICD-10-CM | POA: Diagnosis not present

## 2020-08-19 ENCOUNTER — Encounter: Payer: Self-pay | Admitting: Radiation Oncology

## 2020-08-19 ENCOUNTER — Encounter: Payer: Self-pay | Admitting: *Deleted

## 2020-08-19 ENCOUNTER — Other Ambulatory Visit: Payer: Self-pay

## 2020-08-19 ENCOUNTER — Ambulatory Visit
Admission: RE | Admit: 2020-08-19 | Discharge: 2020-08-19 | Disposition: A | Discharge status: Home / Self Care | Payer: Medicaid Other | Source: Ambulatory Visit | Attending: Radiation Oncology | Admitting: Radiation Oncology

## 2020-08-19 ENCOUNTER — Ambulatory Visit: Payer: Self-pay

## 2020-08-19 VITALS — BP 137/81 | HR 71 | Temp 97.1°F | Wt 188.0 lb

## 2020-08-19 DIAGNOSIS — Z79899 Other long term (current) drug therapy: Secondary | ICD-10-CM | POA: Insufficient documentation

## 2020-08-19 DIAGNOSIS — R042 Hemoptysis: Secondary | ICD-10-CM | POA: Insufficient documentation

## 2020-08-19 DIAGNOSIS — R05 Cough: Secondary | ICD-10-CM | POA: Insufficient documentation

## 2020-08-19 DIAGNOSIS — Z87891 Personal history of nicotine dependence: Secondary | ICD-10-CM | POA: Insufficient documentation

## 2020-08-19 DIAGNOSIS — C3412 Malignant neoplasm of upper lobe, left bronchus or lung: Secondary | ICD-10-CM | POA: Insufficient documentation

## 2020-08-19 DIAGNOSIS — J449 Chronic obstructive pulmonary disease, unspecified: Secondary | ICD-10-CM | POA: Diagnosis not present

## 2020-08-19 DIAGNOSIS — C3490 Malignant neoplasm of unspecified part of unspecified bronchus or lung: Secondary | ICD-10-CM

## 2020-08-19 DIAGNOSIS — Z801 Family history of malignant neoplasm of trachea, bronchus and lung: Secondary | ICD-10-CM | POA: Insufficient documentation

## 2020-08-19 LAB — SARS CORONAVIRUS 2 (TAT 6-24 HRS): SARS Coronavirus 2: NEGATIVE

## 2020-08-19 NOTE — Consult Note (Signed)
NEW PATIENT EVALUATION  Name: Wesley Ferguson  MRN: [id number]  Date:   08/19/2020     DOB: 1966-08-28   This 54 y.o. 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  REFERRING PHYSICIAN: No ref. provider found  CHIEF COMPLAINT:  Chief Complaint  Patient presents with  . Consult    DIAGNOSIS: The encounter diagnosis was Malignant neoplasm of unspecified part of unspecified bronchus or lung ([place]).   PREVIOUS INVESTIGATIONS:  PET scan CT scans reviewed Pathology report reviewed Clinical notes reviewed MRI of brain pending  HPI: Patient is a 54 year old heavy smoker greater than 100-pack-year who presented with hemoptysis increasing shortness of breath and dyspnea on exertion.  He was seen CT scan showed a 4 cm left upper lobe cavitary lesion as well as associated probable left upper lobe pneumonia.  PET CT scan demonstrated approximately 5 cm left upper lobe mass with associated hypermetabolic left hilar nodal hypermetabolic nodes suspicious for bronchogenic carcinoma with nodal involvement.  Subcarinal and right paratracheal lymph nodes also displayed mild hyperbolic metabolic features suspicious for additional sites of metastatic disease.  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  Patient is seeing Dr. Faith Rogue for consideration of surgery and had scheduled PFTs.  Patient has canceled his PFT since he is declining surgery at this time.  He is seen today for consideration of concurrent chemoradiation therapy.  He is doing well he states that hemoptysis is no longer present does have a mild slightly productive cough no bone pain.  PLANNED TREATMENT REGIMEN: Concurrent chemoradiation  PAST MEDICAL HISTORY:  has a past medical history of COPD (chronic obstructive pulmonary disease) ([place]), Emphysema  of lung ([place]), and Pneumonia.    PAST SURGICAL HISTORY:  Past Surgical History:  Procedure Laterality Date  . BACK SURGERY  2017   lumbar region herniated disc  . VIDEO BRONCHOSCOPY WITH ENDOBRONCHIAL NAVIGATION N/A 08/07/2020   Procedure: VIDEO BRONCHOSCOPY WITH ENDOBRONCHIAL NAVIGATION;  Surgeon: Tyler Pita, MD;  Location: [facility] ORS;  Service: Pulmonary;  Laterality: N/A;  . VIDEO BRONCHOSCOPY WITH ENDOBRONCHIAL ULTRASOUND N/A 08/07/2020   Procedure: VIDEO BRONCHOSCOPY WITH ENDOBRONCHIAL ULTRASOUND;  Surgeon: Tyler Pita, MD;  Location: [facility] ORS;  Service: Pulmonary;  Laterality: N/A;    FAMILY HISTORY: family history includes Diabetes in his father; Heart attack in his father; Lung cancer (age of onset: 35) in his father; Throat cancer in his maternal aunt.  SOCIAL HISTORY:  reports that he has been smoking cigarettes. He has a 70.00 pack-year smoking history. He has quit using smokeless tobacco.  His smokeless tobacco use included chew. He reports current alcohol use of about 14.0 standard drinks of alcohol per week. He reports that he does not use drugs.  ALLERGIES: Patient has no known allergies.  MEDICATIONS:  Current Outpatient Medications  Medication Sig Dispense Refill  . acetaminophen (TYLENOL) 500 MG tablet Take 500-1,000 mg by mouth every 6 (six) hours as needed (for pain.).    [name] Tiotropium Bromide-Olodaterol (STIOLTO RESPIMAT) 2.5-2.5 MCG/ACT AERS Inhale 2 puffs into the lungs daily.     No current facility-administered medications for this encounter.    ECOG PERFORMANCE STATUS:  1 - Symptomatic but completely ambulatory  REVIEW OF SYSTEMS: Patient denies any weight loss, fatigue, weakness, fever, chills or night sweats. Patient denies any loss of vision, blurred vision. Patient denies any  ringing  of the ears or hearing loss. No irregular heartbeat. Patient denies heart murmur or history of fainting. Patient denies any chest pain or pain radiating to her  upper extremities. Patient denies any shortness of breath, difficulty breathing at night, cough or hemoptysis. Patient denies any swelling in the lower legs. Patient denies any nausea vomiting, vomiting of blood, or coffee ground material in the vomitus. Patient denies any stomach pain. Patient states has had normal bowel movements no significant constipation or diarrhea. Patient denies any dysuria, hematuria or significant nocturia. Patient denies any problems walking, swelling in the joints or loss of balance. Patient denies any skin changes, loss of hair or loss of weight. Patient denies any excessive worrying or anxiety or significant depression. Patient denies any problems with insomnia. Patient denies excessive thirst, polyuria, polydipsia. Patient denies any swollen glands, patient denies easy bruising or easy bleeding. Patient denies any recent infections, allergies or URI. Patient "s visual fields have not changed significantly in recent time.   PHYSICAL EXAM: BP 137/81   Pulse 71   Temp (!) 97.1 F (36.2 C) (Tympanic)   Wt 188 lb (85.3 kg)   SpO2 99% Comment: room air  BMI 24.14 kg/m  Well-developed well-nourished patient in NAD. HEENT reveals PERLA, EOMI, discs not visualized.  Oral cavity is clear. No oral mucosal lesions are identified. Neck is clear without evidence of cervical or supraclavicular adenopathy. Lungs are clear to A&P. Cardiac examination is essentially unremarkable with regular rate and rhythm without murmur rub or thrill. Abdomen is benign with no organomegaly or masses noted. Motor sensory and DTR levels are equal and symmetric in the upper and lower extremities. Cranial nerves II through XII are grossly intact. Proprioception is intact. No peripheral adenopathy or edema is identified. No motor or sensory levels are noted. Crude visual fields are within normal range.  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  We discussed the risks and benefits of surgery as well as radiation therapy with concurrent chemotherapy.  I would plan on delivering 7000 cGy using IMRT treatment planning and delivery to his left lung and hypermetabolic lymph nodes.  I would choose IMRT to spare critical structures such as his heart esophagus spinal cord and normal lung volume.  Risks and benefits of treatment including possible radiation esophagitis fatigue skin reaction alteration of blood counts loss of normal lung volume all were discussed in detail with the patient.  He seems to comprehend my treatment plan well.  Our nurse navigator was present.  I have contacted Dr. Janese Banks that he will be undergoing treatment although he is him and his family are going on vacation until 20 September.  I will have completed simulation by then plan on starting treatment at that time with concurrent chemotherapy.  There will be extra effort by both professional staff as well as technical staff to coordinate and manage concurrent chemoradiation and ensuing side effects during his treatments. Patient and wife both seem to comprehend my treatment plan well.  I would like to take this opportunity to thank you for allowing me to participate in the care of your patient.Noreene Filbert, MD

## 2020-08-19 NOTE — Progress Notes (Signed)
  Oncology Nurse Navigator Documentation  Navigator Location: [facility] (08/19/20 1400)   )Navigator Encounter Type: Initial RadOnc (08/19/20 1400)     Confirmed Diagnosis Date: 08/11/20 (08/19/20 1400)                 Treatment Phase: Pre-Tx/Tx Discussion (08/19/20 1400) Barriers/Navigation Needs: Coordination of Care;Underinsured;Education (08/19/20 1400) Education: Newly Diagnosed Cancer Education;Understanding Cancer/ Treatment Options (08/19/20 1400) Interventions: Coordination of Care;Education;Referrals (08/19/20 1400) Referrals: Social Work (08/19/20 1400) Coordination of Care: Appts;Chemo (08/19/20 1400) Education Method: Written;Verbal (08/19/20 1400)      Acuity: Level 3-Moderate Needs (3-4 Barriers Identified) (08/19/20 1400)  met with patient and his daughter during initial consult with Dr. Baruch Gouty. All questions answered during visit. Pt stated that at this time he does not desire to pursue surgery and would like to pursue chemo and radiation. Reviewed with pt that he will need port placement and chemo education before starting chemo. Pt stated that he would like to meet with Dr. Janese Banks prior to start to further discuss chemotherapy. Pt scheduled to follow up with Dr. Janese Banks on Mon 8/30 and tentatively scheduled for chemo edu on 9/7 and consult with Dr. Genevive Bi for port placement on 9/3. Pt given resources regarding diagnosis and supportive services available. Nothing further needed at this time. Pt and his daughter instructed to call with any further questions or needs. Pt and his daughter verbalized understanding.        Time Spent with Patient: 60 (08/19/20 1400)

## 2020-08-21 ENCOUNTER — Ambulatory Visit: Payer: Self-pay | Admitting: Cardiothoracic Surgery

## 2020-08-24 ENCOUNTER — Inpatient Hospital Stay (HOSPITAL_BASED_OUTPATIENT_CLINIC_OR_DEPARTMENT_OTHER): Payer: Medicaid Other | Admitting: Oncology

## 2020-08-24 ENCOUNTER — Inpatient Hospital Stay: Payer: Medicaid Other | Admitting: Oncology

## 2020-08-24 ENCOUNTER — Ambulatory Visit
Admission: RE | Admit: 2020-08-24 | Discharge: 2020-08-24 | Disposition: A | Discharge status: Home / Self Care | Payer: Medicaid Other | Source: Ambulatory Visit | Attending: Oncology | Admitting: Oncology

## 2020-08-24 ENCOUNTER — Encounter: Payer: Self-pay | Admitting: *Deleted

## 2020-08-24 ENCOUNTER — Encounter: Payer: Self-pay | Admitting: Oncology

## 2020-08-24 ENCOUNTER — Other Ambulatory Visit: Payer: Self-pay

## 2020-08-24 VITALS — BP 136/89 | HR 70 | Temp 98.4°F | Wt 185.3 lb

## 2020-08-24 DIAGNOSIS — C3490 Malignant neoplasm of unspecified part of unspecified bronchus or lung: Secondary | ICD-10-CM

## 2020-08-24 DIAGNOSIS — Z7189 Other specified counseling: Secondary | ICD-10-CM

## 2020-08-24 DIAGNOSIS — C3412 Malignant neoplasm of upper lobe, left bronchus or lung: Secondary | ICD-10-CM | POA: Diagnosis not present

## 2020-08-24 MED ORDER — PROCHLORPERAZINE MALEATE 10 MG PO TABS: 10.0000 mg | ORAL_TABLET | Freq: Four times a day (QID) | ORAL | 1 refills | Status: DC | PRN

## 2020-08-24 MED ORDER — DEXAMETHASONE 4 MG PO TABS: 8.0000 mg | ORAL_TABLET | Freq: Every day | ORAL | 1 refills | Status: DC

## 2020-08-24 MED ORDER — LIDOCAINE-PRILOCAINE 2.5-2.5 % EX CREA: TOPICAL_CREAM | CUTANEOUS | 3 refills | Status: DC

## 2020-08-24 MED ORDER — ONDANSETRON HCL 8 MG PO TABS: 8.0000 mg | ORAL_TABLET | Freq: Two times a day (BID) | ORAL | 1 refills | Status: DC | PRN

## 2020-08-24 MED ORDER — GADOBUTROL 1 MMOL/ML IV SOLN
8.0000 mL | Freq: Once | INTRAVENOUS | Status: AC | PRN
  Administered 2020-08-24: 8 mL via INTRAVENOUS

## 2020-08-24 NOTE — Progress Notes (Signed)
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  He had a chest x-ray followed by a CT scan which showed a 5.7 x 4.4 cm left upper lobe mass along with left hilar adenopathy.  There was a low-density 3.6 lesion noted in the right hepatic lobe which ultimately turned out to be a hemangioma on MRI liver.  PET CT scan showed hypermetabolic activity in the left upper lobe and left hilar nodal tissue.  Subcarinal and right paratracheal lymph nodes with mild hypermetabolic features.  Patient underwent bronchoscopies and biopsies.Left upper lobe biopsy was consistent with non-small cell carcinoma favor squamous cell carcinoma right subcarinal lymph node biopsy was negative for malignancy.  Interval history-patient met with Dr. Genevive Bi as well as Dr. Donella Stade and has opted to go for concurrent chemoradiation.  Currently he reports doing well and denies any symptoms of hemoptysis.  ECOG PS- 0 Pain scale- 0   Review of systems- Review of Systems  Constitutional: Positive for malaise/fatigue. Negative for chills, fever and weight loss.  HENT: Negative for congestion, ear discharge and nosebleeds.   Eyes: Negative for blurred vision.  Respiratory: Negative for cough, hemoptysis, sputum production, shortness of breath and wheezing.   Cardiovascular: Negative for chest pain, palpitations, orthopnea and claudication.   Gastrointestinal: Negative for abdominal pain, blood in stool, constipation, diarrhea, heartburn, melena, nausea and vomiting.  Genitourinary: Negative for dysuria, flank pain, frequency, hematuria and urgency.  Musculoskeletal: Negative for back pain, joint pain and myalgias.  Skin: Negative for rash.  Neurological: Negative for dizziness, tingling, focal weakness, seizures, weakness and headaches.  Endo/Heme/Allergies: Does not bruise/bleed easily.  Psychiatric/Behavioral: Negative for depression and suicidal ideas. The patient does not have insomnia.       No Known Allergies   Past Medical History:  Diagnosis Date  . COPD (chronic obstructive pulmonary disease) ([place])   . Emphysema of lung ([place])   . Pneumonia      Past Surgical History:  Procedure Laterality Date  . BACK SURGERY  2017   lumbar region herniated disc  . VIDEO BRONCHOSCOPY WITH ENDOBRONCHIAL NAVIGATION N/A 08/07/2020   Procedure: VIDEO BRONCHOSCOPY WITH ENDOBRONCHIAL NAVIGATION;  Surgeon: Tyler Pita, MD;  Location: [facility] ORS;  Service: Pulmonary;  Laterality: N/A;  . VIDEO BRONCHOSCOPY WITH ENDOBRONCHIAL ULTRASOUND N/A 08/07/2020   Procedure: VIDEO BRONCHOSCOPY WITH ENDOBRONCHIAL ULTRASOUND;  Surgeon: Tyler Pita, MD;  Location: [facility] ORS;  Service: Pulmonary;  Laterality: N/A;    Social History   Socioeconomic History  . Marital status: Divorced    Spouse name: Not on file  . Number of children: Not on file  . Years of education: Not on file  . Highest education level: Not on file  Occupational History  . Not on file  Tobacco Use  . Smoking status: Current Every Day Smoker    Packs/day: 2.00    Years: 35.00    Pack years: 70.00    Types:  Cigarettes  . Smokeless tobacco: Former Systems developer    Types: Chew  . Tobacco comment: was smoking 3 ppd but down to 2 pack per day--07/29/2020  Vaping Use  . Vaping Use: Never used  Substance and Sexual Activity  . Alcohol use: Yes    Alcohol/week: 14.0  standard drinks    Types: 14 Cans of beer per week  . Drug use: No  . Sexual activity: Not on file  Other Topics Concern  . Not on file  Social History Narrative  . Not on file   Social Determinants of Health   Financial Resource Strain:   . Difficulty of Paying Living Expenses: Not on file  Food Insecurity:   . Worried About Charity fundraiser in the Last Year: Not on file  . Ran Out of Food in the Last Year: Not on file  Transportation Needs:   . Lack of Transportation (Medical): Not on file  . Lack of Transportation (Non-Medical): Not on file  Physical Activity:   . Days of Exercise per Week: Not on file  . Minutes of Exercise per Session: Not on file  Stress:   . Feeling of Stress : Not on file  Social Connections:   . Frequency of Communication with Friends and Family: Not on file  . Frequency of Social Gatherings with Friends and Family: Not on file  . Attends Religious Services: Not on file  . Active Member of Clubs or Organizations: Not on file  . Attends Archivist Meetings: Not on file  . Marital Status: Not on file  Intimate Partner Violence:   . Fear of Current or Ex-Partner: Not on file  . Emotionally Abused: Not on file  . Physically Abused: Not on file  . Sexually Abused: Not on file    Family History  Problem Relation Age of Onset  . Diabetes Father   . Lung cancer Father 57  . Heart attack Father   . Throat cancer Maternal Aunt      Current Outpatient Medications:  .  acetaminophen (TYLENOL) 500 MG tablet, Take 500-1,000 mg by mouth every 6 (six) hours as needed (for pain.)., Disp: , Rfl:  .  Tiotropium Bromide-Olodaterol (STIOLTO RESPIMAT) 2.5-2.5 MCG/ACT AERS, Inhale 2 puffs into the lungs daily., Disp: , Rfl:   Physical exam:  Vitals:   08/24/20 0836  BP: 136/89  Pulse: 70  Temp: 98.4 F (36.9 C)  TempSrc: Tympanic  SpO2: 99%  Weight: 185 lb 4.8 oz (84.1 kg)   Physical Exam Constitutional:      General: He is not in acute  distress. Pulmonary:     Effort: Pulmonary effort is normal.     Breath sounds: No stridor.  Skin:    General: Skin is warm and dry.  Neurological:     Mental Status: He is alert and oriented to person, place, and time.      CMP Latest Ref Rng & Units 07/21/2020  Glucose 70 - 99 mg/dL 94  BUN 6 - 20 mg/dL 7  Creatinine 0.61 - 1.24 mg/dL 0.72  Sodium 135 - 145 mmol/L 134(L)  Potassium 3.5 - 5.1 mmol/L 4.6  Chloride 98 - 111 mmol/L 102  CO2 22 - 32 mmol/L 28  Calcium 8.9 - 10.3 mg/dL 9.0  Total Protein 6.5 - 8.1 g/dL -  Total Bilirubin 0.3 - 1.2 mg/dL -  Alkaline Phos 38 - 126 U/L -  AST 15 - 41 U/L -  ALT 0 - 44 U/L -  CBC Latest Ref Rng & Units 07/21/2020  WBC 4.0 - 10.5 K/uL 10.0  Hemoglobin 13.0 - 17.0 g/dL 12.1(L)  Hematocrit 39 - 52 % 35.5(L)  Platelets 150 - 400 K/uL 402(H)    No images are attached to the encounter.  NM PET Image Initial (PI) Skull Base To Thigh  Result Date: 08/11/2020 CLINICAL DATA:  Initial treatment strategy for cavitary lung mass. EXAM: NUCLEAR MEDICINE PET SKULL BASE TO THIGH TECHNIQUE: 10.28 mCi F-18 FDG was injected intravenously. Full-ring PET imaging was performed from the skull base to thigh after the radiotracer. CT data was obtained and used for attenuation correction and anatomic localization. Fasting blood glucose: 76 mg/dl COMPARISON:  CT of the chest of July 20, 2020 FINDINGS: Mediastinal blood pool activity: SUV max 1.96 Liver activity: SUV max NA NECK: No hypermetabolic lymph nodes in the neck. Incidental CT findings: none CHEST: Cavitary mass in the LEFT upper lobe with thick wall and small satellite lesion. Area measures approximately 5.5 x 5.2 cm, when measured in a similar fashion on the prior study is within 1-2 mm of its previous measurement. Wall thickness approximately 16 mm along the medial wall is the area of greatest thickness. Adjacent nodule (image 101, series 3) 0.8 cm similar to the previous exam. Persistent consolidative  change in the lingula but with improved aeration and diminished peripheral nodularity when compared to the prior study. Maximum SUV of LEFT upper lobe, dominant mass 12.8. The small nodule in the LEFT upper lobe just inferior to the dominant mass with a maximum SUV of 3.6. The other small nodules bland into the inferior aspect of the dominant mass but are grossly unchanged compared to the previous study. High RIGHT paratracheal lymph node is the largest node in the chest aside from LEFT hilar nodal tissue (image 75, series 3) approximately 9 mm (SUVmax = 2.81) Intensely hypermetabolic LEFT hilar nodal tissue (image 108, series 3) (SUVmax = 15.6) this is difficult to evaluate in terms of size but is approximately 2.2 cm largely stable compared to the prior exam. Mildly enlarged subcarinal nodal tissue with maximum SUV of 2.9. LEFT suprahilar nodal tissue (image 104, series 3) (SUVmax = 4.9) difficult to measure and is contiguous with soft tissue about the LEFT hilum on the prior study. Subcentimeter substernal, anterior mediastinal lymph nodes along the under surface of the sternum, sternal manubrium (image 81, series 3) FDG uptake less than mediastinal blood pool. Incidental CT findings: Normal caliber thoracic aorta. No pericardial effusion. The small pre pericardial lymph nodes less than a cm without hypermetabolic features. ABDOMEN/PELVIS: No abnormal hypermetabolic activity within the liver, pancreas, adrenal glands, or spleen. No hypermetabolic lymph nodes in the abdomen or pelvis. Signs of hepatic hemangiomata as on previous exams. No FDG uptake in these areas. Adrenal glands are normal. Incidental CT findings: Liver and gallbladder without suspicious finding, previously characterized hepatic lesions as above. No peripancreatic stranding. Spleen normal size and contour. Adrenal glands with normal CT appearance. No hydronephrosis or nephrolithiasis. No acute bowel process. Incipient herniation of the appendix  into the RIGHT inguinal canal. Scattered calcified atheromatous changes of the abdominal aorta extending in the iliac vessels. SKELETON: No focal hypermetabolic activity to suggest skeletal metastasis. Incidental CT findings: none IMPRESSION: 1. Intensely hypermetabolic cavitary thick-walled LEFT upper lobe mass with associated hypermetabolic LEFT hilar nodal tissue most suspicious for bronchogenic neoplasm with nodal involvement. 2. Subcarinal and RIGHT paratracheal lymph nodes also display mildly hypermetabolic features though not to the extent that is seen in  the LEFT hilum. These remain suspicious for additional sites of disease. 3. Adjacent small satellite nodule also with increased metabolic activity as described. 4. Improved presumed postobstructive process in the chest. Attention on follow-up. Aortic Atherosclerosis (ICD10-I70.0) and Emphysema (ICD10-J43.9). Electronically Signed   By: Zetta Bills M.D.   On: 08/11/2020 13:49   DG Chest Port 1 View  Result Date: 08/07/2020 CLINICAL DATA:  Status post left lung biopsy EXAM: PORTABLE CHEST 1 VIEW COMPARISON:  July 20, 2020 FINDINGS: No appreciable pneumothorax. Cavitary lesion again noted in left upper lobe measuring 5.2 x 4.6 cm. Patchy airspace opacity in the left mid lung is stable. No new opacity evident. Heart size and pulmonary vascularity are normal. No adenopathy. No bone lesions. IMPRESSION: No pneumothorax. Cavitary lesion again noted left upper lobe. Patchy airspace opacity left mid lung stable. No new opacity. Stable cardiac silhouette. No adenopathy appreciable. Electronically Signed   By: Lowella Grip III M.D.   On: 08/07/2020 09:43   DG C-Arm 1-60 Min-No Report  Result Date: 08/07/2020 Fluoroscopy was utilized by the requesting physician.  No radiographic interpretation.     Assessment and plan- Patient is a 54 y.o. male with newly diagnosed stage III squamous cell carcinoma of the left upper lobe T3 N1 M0.  He is here to  discuss nonsurgical management for lung cancer  After my initial visit with the patient he subsequently met up with Dr. Genevive Bi to discuss potential surgery as well as Dr. Donella Stade to discuss chemoradiation.  He has ultimately decided not to pursue surgery and would like to proceed with definitive chemoradiation.  I plan to give him weekly carboplatin AUC 2 along with Taxol at 32m per metered square for 7 weeks concurrent with radiation treatment. I again discussed risks and benefits of chemotherapy including all but not limited to nausea, vomiting, low blood counts, risk of infections and hospitalizations.  Risk of infusion reaction and peripheral neuropathy associated with Taxol.  Treatment will be given with a curative intent.  Patient plans to go on a 1 week vacation and will be scheduled for chemo teach and port placement prior to that.  I will plan to see him on 09/22/2020 to start first cycle of chemotherapy along with radiation  Plan is for repeat scans after concurrent chemoradiation and based on response he would be a candidate for maintenance immunotherapy down the line as well  Patient is not vaccinated against Covid and I have encouraged him to get Covid vaccine ASAP   Visit Diagnosis 1. Malignant neoplasm of unspecified part of unspecified bronchus or lung [place]   2. Goals of care, counseling/discussion      Dr. ARanda Evens MD, MPH [id number]/30/2021 12:32 PM

## 2020-08-24 NOTE — Progress Notes (Signed)
  Oncology Nurse Navigator Documentation  Navigator Location: [facility] Onc (08/24/20 1000)   )Navigator Encounter Type: Follow-up Appt (08/24/20 1000)                     Patient Visit Type: MedOnc (08/24/20 1000) Treatment Phase: Pre-Tx/Tx Discussion (08/24/20 1000) Barriers/Navigation Needs: Coordination of Care (08/24/20 1000)   Interventions: Coordination of Care (08/24/20 1000)   Coordination of Care: Appts (08/24/20 1000)         Met with patient and his daughter during follow up visit with Dr. Janese Banks to discuss chemotherapy. All questions answered during visit. Pt encouraged to think about receiving covid vaccine. Reviewed upcoming appts with patient. Nothing further needed at this time.         Time Spent with Patient: 30 (08/24/20 1000)

## 2020-08-24 NOTE — Progress Notes (Signed)
START ON PATHWAY REGIMEN - Non-Small Cell Lung     Administer weekly:     Paclitaxel      Carboplatin   **Always confirm dose/schedule in your pharmacy ordering system**  Patient Characteristics: Preoperative or Nonsurgical Candidate (Clinical Staging), Stage III - Nonsurgical Candidate (Nonsquamous and Squamous), PS = 0, 1 Therapeutic Status: Preoperative or Nonsurgical Candidate (Clinical Staging) AJCC T Category: cT3 AJCC N Category: cN1 AJCC M Category: cM0 AJCC 8 Stage Grouping: IIIA ECOG Performance Status: 0 Intent of Therapy: Curative Intent, Discussed with Patient

## 2020-08-25 ENCOUNTER — Other Ambulatory Visit: Payer: Self-pay

## 2020-08-27 ENCOUNTER — Ambulatory Visit
Admission: RE | Admit: 2020-08-27 | Discharge: 2020-08-27 | Disposition: A | Discharge status: Home / Self Care | Payer: Medicaid Other | Source: Ambulatory Visit | Attending: Radiation Oncology | Admitting: Radiation Oncology

## 2020-08-27 ENCOUNTER — Encounter: Payer: Self-pay | Admitting: *Deleted

## 2020-08-27 DIAGNOSIS — C778 Secondary and unspecified malignant neoplasm of lymph nodes of multiple regions: Secondary | ICD-10-CM | POA: Insufficient documentation

## 2020-08-27 DIAGNOSIS — C3412 Malignant neoplasm of upper lobe, left bronchus or lung: Secondary | ICD-10-CM | POA: Diagnosis not present

## 2020-08-27 DIAGNOSIS — Z87891 Personal history of nicotine dependence: Secondary | ICD-10-CM | POA: Diagnosis not present

## 2020-08-27 NOTE — Progress Notes (Signed)
  Oncology Nurse Navigator Documentation  Navigator Location: [facility] (08/27/20 1000)   )Navigator Encounter Type: Lobby (08/27/20 1000)                     Patient Visit Type: YFRTMY (08/27/20 1000) Treatment Phase: CT SIM (08/27/20 1000) Barriers/Navigation Needs: Coordination of Care (08/27/20 1000)   Interventions: Coordination of Care (08/27/20 1000)   Coordination of Care: Appts (08/27/20 1000)       met with patient during CT simulation today. All questions answered during visit. Pt states that he is not interested in port placement at this time and would like to cancel his visit with Dr. Genevive Bi. Pt informed that port is not required for chemo treatment but is encouraged. Appt with Dr. Genevive Bi has been cancelled. Risks and benefits regarding covid vaccine reviewed with pt but pt declined vaccination at this time. Instructed pt to call if has any further questions or needs. Will follow up at next clinic visit for first cycle of chemotherapy. Nothing further needed at this time.           Time Spent with Patient: 30 (08/27/20 1000)

## 2020-08-28 ENCOUNTER — Ambulatory Visit: Payer: Self-pay | Admitting: Cardiothoracic Surgery

## 2020-08-28 ENCOUNTER — Ambulatory Visit: Payer: Self-pay | Admitting: Oncology

## 2020-08-28 NOTE — Patient Instructions (Signed)
Paclitaxel injection What is this medicine? PACLITAXEL (PAK li TAX el) is a chemotherapy drug. It targets fast dividing cells, like cancer cells, and causes these cells to die. This medicine is used to treat ovarian cancer, breast cancer, lung cancer, Kaposi's sarcoma, and other cancers. This medicine may be used for other purposes; ask your health care provider or pharmacist if you have questions. COMMON BRAND NAME(S): Onxol, Taxol What should I tell my health care provider before I take this medicine? They need to know if you have any of these conditions:  history of irregular heartbeat  liver disease  low blood counts, like low white cell, platelet, or red cell counts  lung or breathing disease, like asthma  tingling of the fingers or toes, or other nerve disorder  an unusual or allergic reaction to paclitaxel, alcohol, polyoxyethylated castor oil, other chemotherapy, other medicines, foods, dyes, or preservatives  pregnant or trying to get pregnant  breast-feeding How should I use this medicine? This drug is given as an infusion into a vein. It is administered in a hospital or clinic by a specially trained health care professional. Talk to your pediatrician regarding the use of this medicine in children. Special care may be needed. Overdosage: If you think you have taken too much of this medicine contact a poison control center or emergency room at once. NOTE: This medicine is only for you. Do not share this medicine with others. What if I miss a dose? It is important not to miss your dose. Call your doctor or health care professional if you are unable to keep an appointment. What may interact with this medicine? Do not take this medicine with any of the following medications:  disulfiram  metronidazole This medicine may also interact with the following medications:  antiviral medicines for hepatitis, HIV or AIDS  certain antibiotics like erythromycin and  clarithromycin  certain medicines for fungal infections like ketoconazole and itraconazole  certain medicines for seizures like carbamazepine, phenobarbital, phenytoin  gemfibrozil  nefazodone  rifampin  St. John's wort This list may not describe all possible interactions. Give your health care provider a list of all the medicines, herbs, non-prescription drugs, or dietary supplements you use. Also tell them if you smoke, drink alcohol, or use illegal drugs. Some items may interact with your medicine. What should I watch for while using this medicine? Your condition will be monitored carefully while you are receiving this medicine. You will need important blood work done while you are taking this medicine. This medicine can cause serious allergic reactions. To reduce your risk you will need to take other medicine(s) before treatment with this medicine. If you experience allergic reactions like skin rash, itching or hives, swelling of the face, lips, or tongue, tell your doctor or health care professional right away. In some cases, you may be given additional medicines to help with side effects. Follow all directions for their use. This drug may make you feel generally unwell. This is not uncommon, as chemotherapy can affect healthy cells as well as cancer cells. Report any side effects. Continue your course of treatment even though you feel ill unless your doctor tells you to stop. Call your doctor or health care professional for advice if you get a fever, chills or sore throat, or other symptoms of a cold or flu. Do not treat yourself. This drug decreases your body's ability to fight infections. Try to avoid being around people who are sick. This medicine may increase your risk to bruise  or bleed. Call your doctor or health care professional if you notice any unusual bleeding. Be careful brushing and flossing your teeth or using a toothpick because you may get an infection or bleed more easily.  If you have any dental work done, tell your dentist you are receiving this medicine. Avoid taking products that contain aspirin, acetaminophen, ibuprofen, naproxen, or ketoprofen unless instructed by your doctor. These medicines may hide a fever. Do not become pregnant while taking this medicine. Women should inform their doctor if they wish to become pregnant or think they might be pregnant. There is a potential for serious side effects to an unborn child. Talk to your health care professional or pharmacist for more information. Do not breast-feed an infant while taking this medicine. Men are advised not to father a child while receiving this medicine. This product may contain alcohol. Ask your pharmacist or healthcare provider if this medicine contains alcohol. Be sure to tell all healthcare providers you are taking this medicine. Certain medicines, like metronidazole and disulfiram, can cause an unpleasant reaction when taken with alcohol. The reaction includes flushing, headache, nausea, vomiting, sweating, and increased thirst. The reaction can last from 30 minutes to several hours. What side effects may I notice from receiving this medicine? Side effects that you should report to your doctor or health care professional as soon as possible:  allergic reactions like skin rash, itching or hives, swelling of the face, lips, or tongue  breathing problems  changes in vision  fast, irregular heartbeat  high or low blood pressure  mouth sores  pain, tingling, numbness in the hands or feet  signs of decreased platelets or bleeding - bruising, pinpoint red spots on the skin, black, tarry stools, blood in the urine  signs of decreased red blood cells - unusually weak or tired, feeling faint or lightheaded, falls  signs of infection - fever or chills, cough, sore throat, pain or difficulty passing urine  signs and symptoms of liver injury like dark yellow or brown urine; general ill feeling or  flu-like symptoms; light-colored stools; loss of appetite; nausea; right upper belly pain; unusually weak or tired; yellowing of the eyes or skin  swelling of the ankles, feet, hands  unusually slow heartbeat Side effects that usually do not require medical attention (report to your doctor or health care professional if they continue or are bothersome):  diarrhea  hair loss  loss of appetite  muscle or joint pain  nausea, vomiting  pain, redness, or irritation at site where injected  tiredness This list may not describe all possible side effects. Call your doctor for medical advice about side effects. You may report side effects to FDA at 1-800-FDA-1088. Where should I keep my medicine? This drug is given in a hospital or clinic and will not be stored at home. NOTE: This sheet is a summary. It may not cover all possible information. If you have questions about this medicine, talk to your doctor, pharmacist, or health care provider.  2020 [employer] Standard (2017-08-15 13:14:55) Carboplatin injection What is this medicine? CARBOPLATIN (KAR boe pla tin) is a chemotherapy drug. It targets fast dividing cells, like cancer cells, and causes these cells to die. This medicine is used to treat ovarian cancer and many other cancers. This medicine may be used for other purposes; ask your health care provider or pharmacist if you have questions. COMMON BRAND NAME(S): Paraplatin What should I tell my health care provider before I take this medicine? They need to  know if you have any of these conditions:  blood disorders  hearing problems  kidney disease  recent or ongoing radiation therapy  an unusual or allergic reaction to carboplatin, cisplatin, other chemotherapy, other medicines, foods, dyes, or preservatives  pregnant or trying to get pregnant  breast-feeding How should I use this medicine? This drug is usually given as an infusion into a vein. It is administered in a  hospital or clinic by a specially trained health care professional. Talk to your pediatrician regarding the use of this medicine in children. Special care may be needed. Overdosage: If you think you have taken too much of this medicine contact a poison control center or emergency room at once. NOTE: This medicine is only for you. Do not share this medicine with others. What if I miss a dose? It is important not to miss a dose. Call your doctor or health care professional if you are unable to keep an appointment. What may interact with this medicine?  medicines for seizures  medicines to increase blood counts like filgrastim, pegfilgrastim, sargramostim  some antibiotics like amikacin, gentamicin, neomycin, streptomycin, tobramycin  vaccines Talk to your doctor or health care professional before taking any of these medicines:  acetaminophen  aspirin  ibuprofen  ketoprofen  naproxen This list may not describe all possible interactions. Give your health care provider a list of all the medicines, herbs, non-prescription drugs, or dietary supplements you use. Also tell them if you smoke, drink alcohol, or use illegal drugs. Some items may interact with your medicine. What should I watch for while using this medicine? Your condition will be monitored carefully while you are receiving this medicine. You will need important blood work done while you are taking this medicine. This drug may make you feel generally unwell. This is not uncommon, as chemotherapy can affect healthy cells as well as cancer cells. Report any side effects. Continue your course of treatment even though you feel ill unless your doctor tells you to stop. In some cases, you may be given additional medicines to help with side effects. Follow all directions for their use. Call your doctor or health care professional for advice if you get a fever, chills or sore throat, or other symptoms of a cold or flu. Do not treat  yourself. This drug decreases your body's ability to fight infections. Try to avoid being around people who are sick. This medicine may increase your risk to bruise or bleed. Call your doctor or health care professional if you notice any unusual bleeding. Be careful brushing and flossing your teeth or using a toothpick because you may get an infection or bleed more easily. If you have any dental work done, tell your dentist you are receiving this medicine. Avoid taking products that contain aspirin, acetaminophen, ibuprofen, naproxen, or ketoprofen unless instructed by your doctor. These medicines may hide a fever. Do not become pregnant while taking this medicine. Women should inform their doctor if they wish to become pregnant or think they might be pregnant. There is a potential for serious side effects to an unborn child. Talk to your health care professional or pharmacist for more information. Do not breast-feed an infant while taking this medicine. What side effects may I notice from receiving this medicine? Side effects that you should report to your doctor or health care professional as soon as possible:  allergic reactions like skin rash, itching or hives, swelling of the face, lips, or tongue  signs of infection - fever or  chills, cough, sore throat, pain or difficulty passing urine  signs of decreased platelets or bleeding - bruising, pinpoint red spots on the skin, black, tarry stools, nosebleeds  signs of decreased red blood cells - unusually weak or tired, fainting spells, lightheadedness  breathing problems  changes in hearing  changes in vision  chest pain  high blood pressure  low blood counts - This drug may decrease the number of white blood cells, red blood cells and platelets. You may be at increased risk for infections and bleeding.  nausea and vomiting  pain, swelling, redness or irritation at the injection site  pain, tingling, numbness in the hands or  feet  problems with balance, talking, walking  trouble passing urine or change in the amount of urine Side effects that usually do not require medical attention (report to your doctor or health care professional if they continue or are bothersome):  hair loss  loss of appetite  metallic taste in the mouth or changes in taste This list may not describe all possible side effects. Call your doctor for medical advice about side effects. You may report side effects to FDA at 1-800-FDA-1088. Where should I keep my medicine? This drug is given in a hospital or clinic and will not be stored at home. NOTE: This sheet is a summary. It may not cover all possible information. If you have questions about this medicine, talk to your doctor, pharmacist, or health care provider.  2020 [employer] Standard (2008-03-18 14:38:05)

## 2020-09-01 ENCOUNTER — Inpatient Hospital Stay: Payer: Medicaid Other | Attending: Oncology

## 2020-09-01 ENCOUNTER — Other Ambulatory Visit: Payer: Self-pay

## 2020-09-01 ENCOUNTER — Inpatient Hospital Stay (HOSPITAL_BASED_OUTPATIENT_CLINIC_OR_DEPARTMENT_OTHER): Payer: Medicaid Other | Admitting: Oncology

## 2020-09-01 DIAGNOSIS — Z8249 Family history of ischemic heart disease and other diseases of the circulatory system: Secondary | ICD-10-CM | POA: Insufficient documentation

## 2020-09-01 DIAGNOSIS — C3490 Malignant neoplasm of unspecified part of unspecified bronchus or lung: Secondary | ICD-10-CM

## 2020-09-01 DIAGNOSIS — C3412 Malignant neoplasm of upper lobe, left bronchus or lung: Secondary | ICD-10-CM | POA: Diagnosis present

## 2020-09-01 DIAGNOSIS — J439 Emphysema, unspecified: Secondary | ICD-10-CM | POA: Insufficient documentation

## 2020-09-01 DIAGNOSIS — R590 Localized enlarged lymph nodes: Secondary | ICD-10-CM | POA: Insufficient documentation

## 2020-09-01 DIAGNOSIS — Z5111 Encounter for antineoplastic chemotherapy: Secondary | ICD-10-CM | POA: Diagnosis present

## 2020-09-01 DIAGNOSIS — F17210 Nicotine dependence, cigarettes, uncomplicated: Secondary | ICD-10-CM | POA: Diagnosis not present

## 2020-09-01 DIAGNOSIS — Z7289 Other problems related to lifestyle: Secondary | ICD-10-CM | POA: Diagnosis not present

## 2020-09-01 DIAGNOSIS — Z801 Family history of malignant neoplasm of trachea, bronchus and lung: Secondary | ICD-10-CM | POA: Diagnosis not present

## 2020-09-01 DIAGNOSIS — H9311 Tinnitus, right ear: Secondary | ICD-10-CM | POA: Insufficient documentation

## 2020-09-01 DIAGNOSIS — Z79899 Other long term (current) drug therapy: Secondary | ICD-10-CM | POA: Insufficient documentation

## 2020-09-01 DIAGNOSIS — Z833 Family history of diabetes mellitus: Secondary | ICD-10-CM | POA: Insufficient documentation

## 2020-09-01 DIAGNOSIS — I700 Atherosclerosis of aorta: Secondary | ICD-10-CM | POA: Insufficient documentation

## 2020-09-01 DIAGNOSIS — Z7952 Long term (current) use of systemic steroids: Secondary | ICD-10-CM | POA: Insufficient documentation

## 2020-09-01 DIAGNOSIS — D1803 Hemangioma of intra-abdominal structures: Secondary | ICD-10-CM | POA: Diagnosis not present

## 2020-09-01 NOTE — Progress Notes (Signed)
[place]  Telephone:([phone] [phone]  Patient Care Team: Patient, No Pcp Per as PCP - General ([place]) Telford Nab, RN as Oncology Nurse Navigator   Name of the patient: Wesley Ferguson  [id number]  1966/07/12   Date of visit: 09/01/20  Diagnosis- Lung Cancer   Chief complaint/Reason for visit- Initial Meeting for [facility], preparing for starting chemotherapy  Heme/Onc history:  Oncology History  Malignant neoplasm of unspecified part of unspecified bronchus or lung ([place])  08/14/2020 Initial Diagnosis   Malignant neoplasm of unspecified part of unspecified bronchus or lung ([place])   08/14/2020 Cancer Staging   Staging form: Lung, AJCC 8th Edition - Clinical stage from 08/14/2020: Stage IIIA (cT3, cN1, cM0) - Signed by Sindy Guadeloupe, MD on 08/14/2020   08/25/2020 -  Chemotherapy   The patient had dexamethasone (DECADRON) 4 MG tablet, 8 mg, Oral, Daily, 1 of 1 cycle, Start date: 08/24/2020, End date: -- PALONOSETRON HCL INJECTION 0.25 MG/5ML, 0.25 mg, Intravenous,  Once, 0 of 4 cycles CARBOplatin (PARAPLATIN) in sodium chloride 0.9 % 100 mL chemo infusion, , Intravenous,  Once, 0 of 4 cycles PACLitaxel (TAXOL) 96 mg in sodium chloride 0.9 % 250 mL chemo infusion (</= 80mg /m2), 45 mg/m2, Intravenous,  Once, 0 of 4 cycles  for chemotherapy treatment.      Interval history-  Mr. Hengst is a 55 year old male who presents to chemo care clinic today for initial meeting in preparation for starting chemotherapy. I introduced the chemo care clinic and we discussed that the role of the clinic is to assist those who are at an increased risk of emergency room visits and/or complications during the course of chemotherapy treatment. We discussed that the increased risk takes into account factors such as age, performance status, and co-morbidities. We also discussed that for some, this might include barriers to  care such as not having a primary care provider, lack of insurance/transportation, or not being able to afford medications. We discussed that the goal of the program is to help prevent unplanned ER visits and help reduce complications during chemotherapy. We do this by discussing specific risk factors to each individual and identifying ways that we can help improve these risk factors and reduce barriers to care.   ECOG FS:1 - Symptomatic but completely ambulatory  Review of systems- Review of Systems  Constitutional: Negative.  Negative for chills, fever, malaise/fatigue and weight loss.  HENT: Negative for congestion, ear pain and tinnitus.   Eyes: Negative.  Negative for blurred vision and double vision.  Respiratory: Negative.  Negative for cough, sputum production and shortness of breath.   Cardiovascular: Negative.  Negative for chest pain, palpitations and leg swelling.  Gastrointestinal: Negative.  Negative for abdominal pain, constipation, diarrhea, nausea and vomiting.  Genitourinary: Negative for dysuria, frequency and urgency.  Musculoskeletal: Negative for back pain and falls.  Skin: Negative.  Negative for rash.  Neurological: Negative.  Negative for weakness and headaches.  Endo/Heme/Allergies: Negative.  Does not bruise/bleed easily.  Psychiatric/Behavioral: Negative.  Negative for depression. The patient is not nervous/anxious and does not have insomnia.      Current treatment-carbo/Taxol  No Known Allergies  Past Medical History:  Diagnosis Date  . COPD (chronic obstructive pulmonary disease) ([place])   . Emphysema of lung ([place])   . Pneumonia     Past Surgical History:  Procedure Laterality Date  . BACK SURGERY  2017   lumbar region herniated disc  .  VIDEO BRONCHOSCOPY WITH ENDOBRONCHIAL NAVIGATION N/A 08/07/2020   Procedure: VIDEO BRONCHOSCOPY WITH ENDOBRONCHIAL NAVIGATION;  Surgeon: Tyler Pita, MD;  Location: [facility] ORS;  Service: Pulmonary;  Laterality: N/A;   . VIDEO BRONCHOSCOPY WITH ENDOBRONCHIAL ULTRASOUND N/A 08/07/2020   Procedure: VIDEO BRONCHOSCOPY WITH ENDOBRONCHIAL ULTRASOUND;  Surgeon: Tyler Pita, MD;  Location: [facility] ORS;  Service: Pulmonary;  Laterality: N/A;    Social History   Socioeconomic History  . Marital status: Divorced    Spouse name: Not on file  . Number of children: Not on file  . Years of education: Not on file  . Highest education level: Not on file  Occupational History  . Not on file  Tobacco Use  . Smoking status: Current Every Day Smoker    Packs/day: 2.00    Years: 35.00    Pack years: 70.00    Types: Cigarettes  . Smokeless tobacco: Former Systems developer    Types: Chew  . Tobacco comment: was smoking 3 ppd but down to 2 pack per day--07/29/2020  Vaping Use  . Vaping Use: Never used  Substance and Sexual Activity  . Alcohol use: Yes    Alcohol/week: 14.0 standard drinks    Types: 14 Cans of beer per week  . Drug use: No  . Sexual activity: Not on file  Other Topics Concern  . Not on file  Social History Narrative  . Not on file   Social Determinants of Health   Financial Resource Strain:   . Difficulty of Paying Living Expenses: Not on file  Food Insecurity:   . Worried About Charity fundraiser in the Last Year: Not on file  . Ran Out of Food in the Last Year: Not on file  Transportation Needs:   . Lack of Transportation (Medical): Not on file  . Lack of Transportation (Non-Medical): Not on file  Physical Activity:   . Days of Exercise per Week: Not on file  . Minutes of Exercise per Session: Not on file  Stress:   . Feeling of Stress : Not on file  Social Connections:   . Frequency of Communication with Friends and Family: Not on file  . Frequency of Social Gatherings with Friends and Family: Not on file  . Attends Religious Services: Not on file  . Active Member of Clubs or Organizations: Not on file  . Attends Archivist Meetings: Not on file  . Marital Status: Not on file   Intimate Partner Violence:   . Fear of Current or Ex-Partner: Not on file  . Emotionally Abused: Not on file  . Physically Abused: Not on file  . Sexually Abused: Not on file    Family History  Problem Relation Age of Onset  . Diabetes Father   . Lung cancer Father 35  . Heart attack Father   . Throat cancer Maternal Aunt      Current Outpatient Medications:  .  acetaminophen (TYLENOL) 500 MG tablet, Take 500-1,000 mg by mouth every 6 (six) hours as needed (for pain.)., Disp: , Rfl:  .  dexamethasone (DECADRON) 4 MG tablet, Take 2 tablets (8 mg total) by mouth daily. Start the day after chemotherapy for 2 days., Disp: 30 tablet, Rfl: 1 .  lidocaine-prilocaine (EMLA) cream, Apply to affected area once, Disp: 30 g, Rfl: 3 .  ondansetron (ZOFRAN) 8 MG tablet, Take 1 tablet (8 mg total) by mouth 2 (two) times daily as needed for refractory nausea / vomiting. Start on day 3 after chemo.,  Disp: 30 tablet, Rfl: 1 .  prochlorperazine (COMPAZINE) 10 MG tablet, Take 1 tablet (10 mg total) by mouth every 6 (six) hours as needed (Nausea or vomiting)., Disp: 30 tablet, Rfl: 1 .  Tiotropium Bromide-Olodaterol (STIOLTO RESPIMAT) 2.5-2.5 MCG/ACT AERS, Inhale 2 puffs into the lungs daily., Disp: , Rfl:   Physical exam: There were no vitals filed for this visit. Physical Exam Constitutional:      Appearance: Normal appearance.  HENT:     Head: Normocephalic and atraumatic.  Eyes:     Pupils: Pupils are equal, round, and reactive to light.  Cardiovascular:     Rate and Rhythm: Normal rate and regular rhythm.     Heart sounds: Normal heart sounds. No murmur heard.   Pulmonary:     Effort: Pulmonary effort is normal.     Breath sounds: Normal breath sounds. No wheezing.  Abdominal:     General: Bowel sounds are normal. There is no distension.     Palpations: Abdomen is soft.     Tenderness: There is no abdominal tenderness.  Musculoskeletal:        General: Normal range of motion.      Cervical back: Normal range of motion.  Skin:    General: Skin is warm and dry.     Findings: No rash.  Neurological:     Mental Status: He is alert and oriented to person, place, and time.  Psychiatric:        Judgment: Judgment normal.      CMP Latest Ref Rng & Units 07/21/2020  Glucose 70 - 99 mg/dL 94  BUN 6 - 20 mg/dL 7  Creatinine 0.61 - 1.24 mg/dL 0.72  Sodium 135 - 145 mmol/L 134(L)  Potassium 3.5 - 5.1 mmol/L 4.6  Chloride 98 - 111 mmol/L 102  CO2 22 - 32 mmol/L 28  Calcium 8.9 - 10.3 mg/dL 9.0  Total Protein 6.5 - 8.1 g/dL -  Total Bilirubin 0.3 - 1.2 mg/dL -  Alkaline Phos 38 - 126 U/L -  AST 15 - 41 U/L -  ALT 0 - 44 U/L -   CBC Latest Ref Rng & Units 07/21/2020  WBC 4.0 - 10.5 K/uL 10.0  Hemoglobin 13.0 - 17.0 g/dL 12.1(L)  Hematocrit 39 - 52 % 35.5(L)  Platelets 150 - 400 K/uL 402(H)    No images are attached to the encounter.  MR Brain W Wo Contrast  Result Date: 08/25/2020 CLINICAL DATA:  Staging of non-small cell lung cancer. EXAM: MRI HEAD WITHOUT AND WITH CONTRAST TECHNIQUE: Multiplanar, multiecho pulse sequences of the brain and surrounding structures were obtained without and with intravenous contrast. CONTRAST:  96mL GADAVIST GADOBUTROL 1 MMOL/ML IV SOLN COMPARISON:  Head CT 12/05/2014 FINDINGS: Brain: There is no evidence of an acute infarct, intracranial hemorrhage, mass, midline shift, or extra-axial fluid collection. The ventricles and sulci are normal. 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 Scattered punctate foci of T2 hyperintensity elsewhere in the cerebral white matter bilaterally are nonspecific but compatible with minimal chronic small vessel ischemic disease. No abnormal enhancement is identified. Vascular: Major intracranial vascular flow voids are preserved. Skull and upper cervical spine: Unremarkable bone marrow signal. Sinuses/Orbits: Unremarkable orbits. Moderate bilateral ethmoid sinus  mucosal thickening. Clear mastoid air cells. Other: None. IMPRESSION: No evidence of intracranial metastases. Electronically Signed   By: Logan Bores M.D.   On: 08/25/2020 12:35   NM PET Image Initial (PI) Skull Base To Thigh  Result  Date: 08/11/2020 CLINICAL DATA:  Initial treatment strategy for cavitary lung mass. EXAM: NUCLEAR MEDICINE PET SKULL BASE TO THIGH TECHNIQUE: 10.28 mCi F-18 FDG was injected intravenously. Full-ring PET imaging was performed from the skull base to thigh after the radiotracer. CT data was obtained and used for attenuation correction and anatomic localization. Fasting blood glucose: 76 mg/dl COMPARISON:  CT of the chest of July 20, 2020 FINDINGS: Mediastinal blood pool activity: SUV max 1.96 Liver activity: SUV max NA NECK: No hypermetabolic lymph nodes in the neck. Incidental CT findings: none CHEST: Cavitary mass in the LEFT upper lobe with thick wall and small satellite lesion. Area measures approximately 5.5 x 5.2 cm, when measured in a similar fashion on the prior study is within 1-2 mm of its previous measurement. Wall thickness approximately 16 mm along the medial wall is the area of greatest thickness. Adjacent nodule (image 101, series 3) 0.8 cm similar to the previous exam. Persistent consolidative change in the lingula but with improved aeration and diminished peripheral nodularity when compared to the prior study. Maximum SUV of LEFT upper lobe, dominant mass 12.8. The small nodule in the LEFT upper lobe just inferior to the dominant mass with a maximum SUV of 3.6. The other small nodules bland into the inferior aspect of the dominant mass but are grossly unchanged compared to the previous study. High RIGHT paratracheal lymph node is the largest node in the chest aside from LEFT hilar nodal tissue (image 75, series 3) approximately 9 mm (SUVmax = 2.81) Intensely hypermetabolic LEFT hilar nodal tissue (image 108, series 3) (SUVmax = 15.6) this is difficult to evaluate in  terms of size but is approximately 2.2 cm largely stable compared to the prior exam. Mildly enlarged subcarinal nodal tissue with maximum SUV of 2.9. LEFT suprahilar nodal tissue (image 104, series 3) (SUVmax = 4.9) difficult to measure and is contiguous with soft tissue about the LEFT hilum on the prior study. Subcentimeter substernal, anterior mediastinal lymph nodes along the under surface of the sternum, sternal manubrium (image 81, series 3) FDG uptake less than mediastinal blood pool. Incidental CT findings: Normal caliber thoracic aorta. No pericardial effusion. The small pre pericardial lymph nodes less than a cm without hypermetabolic features. ABDOMEN/PELVIS: No abnormal hypermetabolic activity within the liver, pancreas, adrenal glands, or spleen. No hypermetabolic lymph nodes in the abdomen or pelvis. Signs of hepatic hemangiomata as on previous exams. No FDG uptake in these areas. Adrenal glands are normal. Incidental CT findings: Liver and gallbladder without suspicious finding, previously characterized hepatic lesions as above. No peripancreatic stranding. Spleen normal size and contour. Adrenal glands with normal CT appearance. No hydronephrosis or nephrolithiasis. No acute bowel process. Incipient herniation of the appendix into the RIGHT inguinal canal. Scattered calcified atheromatous changes of the abdominal aorta extending in the iliac vessels. SKELETON: No focal hypermetabolic activity to suggest skeletal metastasis. Incidental CT findings: none IMPRESSION: 1. Intensely hypermetabolic cavitary thick-walled LEFT upper lobe mass with associated hypermetabolic LEFT hilar nodal tissue most suspicious for bronchogenic neoplasm with nodal involvement. 2. Subcarinal and RIGHT paratracheal lymph nodes also display mildly hypermetabolic features though not to the extent that is seen in the LEFT hilum. These remain suspicious for additional sites of disease. 3. Adjacent small satellite nodule also with  increased metabolic activity as described. 4. Improved presumed postobstructive process in the chest. Attention on follow-up. Aortic Atherosclerosis (ICD10-I70.0) and Emphysema (ICD10-J43.9). Electronically Signed   By: Zetta Bills M.D.   On: 08/11/2020 13:49   DG  Chest Port 1 View  Result Date: 08/07/2020 CLINICAL DATA:  Status post left lung biopsy EXAM: PORTABLE CHEST 1 VIEW COMPARISON:  July 20, 2020 FINDINGS: No appreciable pneumothorax. Cavitary lesion again noted in left upper lobe measuring 5.2 x 4.6 cm. Patchy airspace opacity in the left mid lung is stable. No new opacity evident. Heart size and pulmonary vascularity are normal. No adenopathy. No bone lesions. IMPRESSION: No pneumothorax. Cavitary lesion again noted left upper lobe. Patchy airspace opacity left mid lung stable. No new opacity. Stable cardiac silhouette. No adenopathy appreciable. Electronically Signed   By: Lowella Grip III M.D.   On: 08/07/2020 09:43   DG C-Arm 1-60 Min-No Report  Result Date: 08/07/2020 Fluoroscopy was utilized by the requesting physician.  No radiographic interpretation.     Assessment and plan- Patient is a 54 y.o. male who presents to [facility] for initial meeting in preparation for starting chemotherapy for the treatment of lung cancer.   1. HPI: Mr. Mejorado is a 54 year old male with past medical history significant for tobacco abuse with greater than 100-pack-year history of smoking who was recently diagnosed with lung cancer.  He presented with hemoptysis and increasing shortness of breath with dyspnea.  Subsequent imaging showed a 4 cm left upper lobe lesion as well as pneumonia.  PET/CT showed 5 cm left upper lobe mass with hypermetabolic left hilar nodal hypermetabolic suspicious for bronchogenic carcinoma with nodal involvement.  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  He has been evaluated by Dr. Faith Rogue for  consideration of surgery and PFTs.  Plan is for him to receive radiation along with chemotherapy in the next week or so.  2. Chemo Care Clinic/High Risk for ER/Hospitalization during chemotherapy- We discussed the role of the chemo care clinic and identified patient specific risk factors. I discussed that patient was identified as high risk primarily based on: Age and stage of disease.  Patient has past medical history positive for: Past Medical History:  Diagnosis Date  . COPD (chronic obstructive pulmonary disease) ([place])   . Emphysema of lung ([place])   . Pneumonia     Patient has past surgical history positive for: Past Surgical History:  Procedure Laterality Date  . BACK SURGERY  2017   lumbar region herniated disc  . VIDEO BRONCHOSCOPY WITH ENDOBRONCHIAL NAVIGATION N/A 08/07/2020   Procedure: VIDEO BRONCHOSCOPY WITH ENDOBRONCHIAL NAVIGATION;  Surgeon: Tyler Pita, MD;  Location: [facility] ORS;  Service: Pulmonary;  Laterality: N/A;  . VIDEO BRONCHOSCOPY WITH ENDOBRONCHIAL ULTRASOUND N/A 08/07/2020   Procedure: VIDEO BRONCHOSCOPY WITH ENDOBRONCHIAL ULTRASOUND;  Surgeon: Tyler Pita, MD;  Location: [facility] ORS;  Service: Pulmonary;  Laterality: N/A;    Based on our high risk symptom management report; this patient has a high risk of ED utilization.  The percentage below indicates how "at risk "  this patient based on the factors in this table within one year.   General Risk Score: 6  Values used to calculate this score:   Points  Metrics      0        Age: 44      [facility] Admissions: 3      3        ED Visits: 3      0        Has Chronic Obstructive Pulmonary Disease: No      0  Has Diabetes: No      0        Has Congestive Heart Failure: No      0        Has liver disease: No      0        Has Depression: No      0        Current PCP: No Pcp Per Patient      0        Has Medicaid: No    3. We discussed that social determinants of health may have  significant impacts on health and outcomes for cancer patients.  Today we discussed specific social determinants of performance status, alcohol use, depression, financial needs, food insecurity, housing, interpersonal violence, social connections, stress, tobacco use, and transportation.    After lengthy discussion the following were identified as areas of need: None at this time.  Outpatient services: We discussed options including home based and outpatient services, DME and care program. We discusssed that patients who participate in regular physical activity report fewer negative impacts of cancer and treatments and report less fatigue.   Financial Concerns: We discussed that living with cancer can create tremendous financial burden.  We discussed options for assistance. I asked that if assistance is needed in affording medications or paying bills to please let us know so that we can provide assistance. We discussed options for food including social services, [employer] ($50 every 2 weeks) and onsite food pantry.  We will also notify [facility] to see if cancer center can provide additional support.  Referral to Social work: Introduced Education officer, museum Elease Etienne and the services he can provide such as support with [employer], cell phone and gas vouchers.   Support groups: We discussed options for support groups at the cancer center. If interested, please notify nurse navigator to enroll. We discussed options for managing stress including healthy eating, exercise as well as participating in no charge counseling services at the cancer center and support groups.  If these are of interest, patient can notify either myself or primary nursing team.We discussed options for management including medications and referral to quit Smart program  Transportation: We discussed options for transportation including acta, paratransit, bus routes, link transit, taxi/uber/lyft, and cancer center [place].  I  have notified primary oncology team who will help assist with arranging Lucianne Lei transportation for appointments when/if needed. We also discussed options for transportation on short notice/acute visits.  Palliative care services: We have palliative care services available in the cancer center to discuss goals of care and advanced care planning.  Please let us know if you have any questions or would like to speak to our palliative nurse practitioner.  Symptom Management Clinic: We discussed our symptom management clinic which is available for acute concerns while receiving treatment such as nausea, vomiting or diarrhea.  We can be reached via telephone at [phone] or through my chart.  We are available for virtual or in person visits on the same day from 830 to 4 PM Monday through Friday. He denies needing specific assistance at this time and He will be followed by Dr. Elroy Channel clinical team.  Plan: Discussed symptom management clinic. Discussed palliative care services. Discussed resources that are available here at the cancer center. Discussed medications and new prescriptions to begin treatment such as anti-nausea or steroids.   Disposition: RTC on on 09/14/2020 to initiate chemotherapy, with lab work and MD assessment. RTC on 09/14/2020 to  initiate radiation. Referral to palliative care.   Visit Diagnosis 1. Malignant neoplasm of unspecified part of unspecified bronchus or lung ([place])     Patient expressed understanding and was in agreement with this plan. He also understands that He can call clinic at any time with any questions, concerns, or complaints.   [place] [facility] [place]  CC: Dr. Janese Banks

## 2020-09-02 ENCOUNTER — Encounter: Payer: Self-pay | Admitting: Oncology

## 2020-09-03 ENCOUNTER — Encounter: Payer: Self-pay | Admitting: Oncology

## 2020-09-07 NOTE — Progress Notes (Signed)
Pharmacist Chemotherapy Monitoring - Initial Assessment    Anticipated start date: 09/14/20  Regimen:  . Are orders appropriate based on the patient's diagnosis, regimen, and cycle? Yes . Does the plan date match the patient's scheduled date? Yes . Is the sequencing of drugs appropriate? Yes . Are the premedications appropriate for the patient's regimen? Yes . Prior Authorization for treatment is: Approved o If applicable, is the correct biosimilar selected based on the patient's insurance? not applicable  Organ Function and Labs: [name] Are dose adjustments needed based on the patient's renal function, hepatic function, or hematologic function? No . Are appropriate labs ordered prior to the start of patient's treatment? Yes . Other organ system assessment, if indicated: N/A . The following baseline labs, if indicated, have been ordered: N/A  Dose Assessment: . Are the drug doses appropriate? Yes . Are the following correct: o Drug concentrations Yes o IV fluid compatible with drug Yes o Administration routes Yes o Timing of therapy Yes . If applicable, does the patient have documented access for treatment and/or plans for port-a-cath placement? yes . If applicable, have lifetime cumulative doses been properly documented and assessed? yes Lifetime Dose Tracking  No doses have been documented on this patient for the following tracked chemicals: Doxorubicin, Epirubicin, Idarubicin, Daunorubicin, Mitoxantrone, Bleomycin, Oxaliplatin, Carboplatin, Liposomal Doxorubicin  o   Toxicity Monitoring/Prevention: . The patient has the following take home antiemetics prescribed: Ondansetron, Prochlorperazine and Dexamethasone . The patient has the following take home medications prescribed: N/A . Medication allergies and previous infusion related reactions, if applicable, have been reviewed and addressed. Yes . The patient's current medication list has been assessed for drug-drug interactions with  their chemotherapy regimen. no or  significant drug-drug interactions were identified on review.  Order Review: . Are the treatment plan orders signed? No . Is the patient scheduled to see a provider prior to their treatment? Yes  I verify that I have reviewed each item in the above checklist and answered each question accordingly.  Wesley Ferguson 09/07/2020 8:41 AM

## 2020-09-08 ENCOUNTER — Ambulatory Visit: Payer: Medicaid Other

## 2020-09-08 DIAGNOSIS — C3412 Malignant neoplasm of upper lobe, left bronchus or lung: Secondary | ICD-10-CM | POA: Diagnosis not present

## 2020-09-08 DIAGNOSIS — Z87891 Personal history of nicotine dependence: Secondary | ICD-10-CM | POA: Diagnosis not present

## 2020-09-10 ENCOUNTER — Ambulatory Visit: Payer: Medicaid Other

## 2020-09-14 ENCOUNTER — Ambulatory Visit: Payer: Medicaid Other

## 2020-09-14 ENCOUNTER — Encounter: Payer: Self-pay | Admitting: *Deleted

## 2020-09-14 ENCOUNTER — Encounter: Payer: Self-pay | Admitting: Oncology

## 2020-09-14 ENCOUNTER — Ambulatory Visit: Admission: RE | Admit: 2020-09-14 | Payer: Medicaid Other | Source: Ambulatory Visit

## 2020-09-14 ENCOUNTER — Inpatient Hospital Stay (HOSPITAL_BASED_OUTPATIENT_CLINIC_OR_DEPARTMENT_OTHER): Payer: Medicaid Other | Admitting: Oncology

## 2020-09-14 ENCOUNTER — Other Ambulatory Visit: Payer: Self-pay | Admitting: *Deleted

## 2020-09-14 ENCOUNTER — Other Ambulatory Visit: Payer: Self-pay

## 2020-09-14 ENCOUNTER — Inpatient Hospital Stay (HOSPITAL_BASED_OUTPATIENT_CLINIC_OR_DEPARTMENT_OTHER): Payer: Medicaid Other | Admitting: Hospice and Palliative Medicine

## 2020-09-14 ENCOUNTER — Inpatient Hospital Stay: Payer: Medicaid Other

## 2020-09-14 VITALS — BP 125/79 | HR 67 | Temp 97.4°F | Resp 16

## 2020-09-14 VITALS — BP 138/92 | HR 71 | Temp 97.0°F | Resp 16 | Wt 184.2 lb

## 2020-09-14 DIAGNOSIS — C3490 Malignant neoplasm of unspecified part of unspecified bronchus or lung: Secondary | ICD-10-CM

## 2020-09-14 DIAGNOSIS — Z515 Encounter for palliative care: Secondary | ICD-10-CM

## 2020-09-14 DIAGNOSIS — Z5111 Encounter for antineoplastic chemotherapy: Secondary | ICD-10-CM

## 2020-09-14 LAB — CBC WITH DIFFERENTIAL/PLATELET
Abs Immature Granulocytes: 0.03 10*3/uL (ref 0.00–0.07)
Basophils Absolute: 0.1 10*3/uL (ref 0.0–0.1)
Basophils Relative: 1 %
Eosinophils Absolute: 0.3 10*3/uL (ref 0.0–0.5)
Eosinophils Relative: 4 %
HCT: 42.3 % (ref 39.0–52.0)
Hemoglobin: 14.7 g/dL (ref 13.0–17.0)
Immature Granulocytes: 0 %
Lymphocytes Relative: 21 %
Lymphs Abs: 1.7 10*3/uL (ref 0.7–4.0)
MCH: 29.6 pg (ref 26.0–34.0)
MCHC: 34.8 g/dL (ref 30.0–36.0)
MCV: 85.3 fL (ref 80.0–100.0)
Monocytes Absolute: 0.9 10*3/uL (ref 0.1–1.0)
Monocytes Relative: 11 %
Neutro Abs: 5.3 10*3/uL (ref 1.7–7.7)
Neutrophils Relative %: 63 %
Platelets: 355 10*3/uL (ref 150–400)
RBC: 4.96 MIL/uL (ref 4.22–5.81)
RDW: 13.8 % (ref 11.5–15.5)
WBC: 8.3 10*3/uL (ref 4.0–10.5)
nRBC: 0 % (ref 0.0–0.2)

## 2020-09-14 LAB — COMPREHENSIVE METABOLIC PANEL
ALT: 14 U/L (ref 0–44)
AST: 16 U/L (ref 15–41)
Albumin: 3.8 g/dL (ref 3.5–5.0)
Alkaline Phosphatase: 92 U/L (ref 38–126)
Anion gap: 10 (ref 5–15)
BUN: 6 mg/dL (ref 6–20)
CO2: 26 mmol/L (ref 22–32)
Calcium: 9.3 mg/dL (ref 8.9–10.3)
Chloride: 97 mmol/L — ABNORMAL LOW (ref 98–111)
Creatinine, Ser: 0.93 mg/dL (ref 0.61–1.24)
GFR calc Af Amer: >60 mL/min (ref >60)
GFR calc non Af Amer: >60 mL/min (ref >60)
Glucose, Bld: 91 mg/dL (ref 70–99)
Potassium: 4.6 mmol/L (ref 3.5–5.1)
Sodium: 133 mmol/L — ABNORMAL LOW (ref 135–145)
Total Bilirubin: 0.6 mg/dL (ref 0.3–1.2)
Total Protein: 7.8 g/dL (ref 6.5–8.1)

## 2020-09-14 LAB — HEPATITIS B CORE ANTIBODY, TOTAL: Hep B Core Total Ab: NONREACTIVE

## 2020-09-14 LAB — HEPATITIS B SURFACE ANTIGEN: Hepatitis B Surface Ag: NONREACTIVE

## 2020-09-14 MED ORDER — FAMOTIDINE IN NACL 20-0.9 MG/50ML-% IV SOLN
20.0000 mg | Freq: Once | INTRAVENOUS | Status: AC
  Administered 2020-09-14: 20 mg via INTRAVENOUS
  Filled 2020-09-14: qty 50

## 2020-09-14 MED ORDER — SODIUM CHLORIDE 0.9 % IV SOLN
20.0000 mg | Freq: Once | INTRAVENOUS | Status: AC
  Administered 2020-09-14: 20 mg via INTRAVENOUS
  Filled 2020-09-14: qty 20

## 2020-09-14 MED ORDER — DIPHENHYDRAMINE HCL 50 MG/ML IJ SOLN
50.0000 mg | Freq: Once | INTRAMUSCULAR | Status: AC
  Administered 2020-09-14: 50 mg via INTRAVENOUS
  Filled 2020-09-14: qty 1

## 2020-09-14 MED ORDER — SODIUM CHLORIDE 0.9 % IV SOLN
271.0000 mg | Freq: Once | INTRAVENOUS | Status: AC
  Administered 2020-09-14: 270 mg via INTRAVENOUS
  Filled 2020-09-14: qty 27

## 2020-09-14 MED ORDER — SODIUM CHLORIDE 0.9 % IV SOLN
45.0000 mg/m2 | Freq: Once | INTRAVENOUS | Status: AC
  Administered 2020-09-14: 96 mg via INTRAVENOUS
  Filled 2020-09-14: qty 16

## 2020-09-14 MED ORDER — HEPARIN SOD (PORK) LOCK FLUSH 100 UNIT/ML IV SOLN
500.0000 [IU] | Freq: Once | INTRAVENOUS | Status: DC | PRN
  Filled 2020-09-14: qty 5

## 2020-09-14 MED ORDER — PALONOSETRON HCL INJECTION 0.25 MG/5ML
0.2500 mg | Freq: Once | INTRAVENOUS | Status: AC
  Administered 2020-09-14: 0.25 mg via INTRAVENOUS
  Filled 2020-09-14: qty 5

## 2020-09-14 MED ORDER — SODIUM CHLORIDE 0.9 % IV SOLN
Freq: Once | INTRAVENOUS | Status: AC
  Filled 2020-09-14: qty 250

## 2020-09-14 NOTE — Progress Notes (Signed)
[place]  [phone] [phone]   Name: Wesley Ferguson Date: 09/14/2020 MRN: [id number]  DOB: 1966-04-21  Patient Care Team: Patient, No Pcp Per as PCP - General ([place]) Telford Nab, RN as Oncology Nurse Navigator    REASON FOR CONSULTATION: [name] is a 54 y.o. 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  Patient was referred to palliative care to help address goals and manage ongoing symptoms.  SOCIAL HISTORY:     reports that he has been smoking cigarettes. He has a 70.00 pack-year smoking history. He has quit using smokeless tobacco.  His smokeless tobacco use included chew. He reports current alcohol use of about 14.0 standard drinks of alcohol per week. He reports that he does not use drugs..  Patient is unmarried.  He lives at home with his mother.  He has a daughter who lives nearby.  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  ADVANCE DIRECTIVES:  Does not have  CODE STATUS:   PAST MEDICAL HISTORY: Past Medical History:  Diagnosis Date  . COPD (chronic obstructive pulmonary disease) ([place])   . Emphysema of lung ([place])   . Pneumonia     PAST SURGICAL HISTORY:  Past Surgical History:  Procedure Laterality Date  . BACK SURGERY  2017   lumbar region herniated disc  . VIDEO BRONCHOSCOPY WITH ENDOBRONCHIAL NAVIGATION N/A 08/07/2020   Procedure: VIDEO BRONCHOSCOPY WITH ENDOBRONCHIAL NAVIGATION;  Surgeon: Tyler Pita, MD;  Location: [facility] ORS;  Service: Pulmonary;  Laterality: N/A;  . VIDEO BRONCHOSCOPY WITH ENDOBRONCHIAL ULTRASOUND N/A 08/07/2020   Procedure: VIDEO BRONCHOSCOPY WITH ENDOBRONCHIAL ULTRASOUND;  Surgeon: Tyler Pita, MD;  Location: [facility] ORS;  Service: Pulmonary;  Laterality: N/A;     HEMATOLOGY/ONCOLOGY HISTORY:  Oncology History  Malignant neoplasm of unspecified part of unspecified bronchus or lung ([place])  08/14/2020 Initial Diagnosis   Malignant neoplasm of unspecified part of unspecified bronchus or lung ([place])   08/14/2020 Cancer Staging   Staging form: Lung, AJCC 8th Edition - Clinical stage from 08/14/2020: Stage IIIA (cT3, cN1, cM0) - Signed by Sindy Guadeloupe, MD on 08/14/2020   09/14/2020 -  Chemotherapy   The patient had dexamethasone (DECADRON) 4 MG tablet, 8 mg, Oral, Daily, 1 of 1 cycle, Start date: 08/24/2020, End date: -- palonosetron (ALOXI) injection 0.25 mg, 0.25 mg, Intravenous,  Once, 1 of 4 cycles CARBOplatin (PARAPLATIN) 270 mg in sodium chloride 0.9 % 100 mL chemo infusion, 270 mg (100 % of original dose 271 mg), Intravenous,  Once, 1 of 4 cycles Dose modification:   (original dose 271 mg, Cycle 1) PACLitaxel (TAXOL) 96 mg in sodium chloride 0.9 % 250 mL chemo infusion (</= 90m/m2), 45 mg/m2 = 96 mg, Intravenous,  Once, 1 of 4 cycles  for chemotherapy treatment.      ALLERGIES:  has No Known Allergies.  MEDICATIONS:  Current Outpatient Medications  Medication Sig Dispense Refill  . acetaminophen (TYLENOL) 500 MG tablet Take 500-1,000 mg by mouth every 6 (six) hours as needed (for pain.).    .Marland Kitchendexamethasone (DECADRON) 4 MG tablet Take 2 tablets (8 mg total) by mouth daily. Start the day after chemotherapy for 2 days. (Patient not taking: Reported on 09/14/2020) 30 tablet 1  . lidocaine-prilocaine (EMLA) cream Apply to affected area once (Patient not taking: Reported  on 09/14/2020) 30 g 3  . ondansetron (ZOFRAN) 8 MG tablet Take 1 tablet (8 mg total) by mouth 2 (two) times daily as needed for refractory nausea / vomiting. Start on day 3 after chemo. (Patient not taking: Reported on 09/14/2020) 30 tablet 1  . prochlorperazine (COMPAZINE) 10 MG tablet Take 1 tablet (10 mg total) by mouth every 6 (six) hours as needed (Nausea or vomiting). (Patient not  taking: Reported on 09/14/2020) 30 tablet 1  . Tiotropium Bromide-Olodaterol (STIOLTO RESPIMAT) 2.5-2.5 MCG/ACT AERS Inhale 2 puffs into the lungs daily.     No current facility-administered medications for this visit.   Facility-Administered Medications Ordered in Other Visits  Medication Dose Route Frequency Provider Last Rate Last Admin  . CARBOplatin (PARAPLATIN) 270 mg in sodium chloride 0.9 % 250 mL chemo infusion  270 mg Intravenous Once Sindy Guadeloupe, MD      . PACLitaxel (TAXOL) 96 mg in sodium chloride 0.9 % 250 mL chemo infusion (</= 62m/m2)  45 mg/m2 (Treatment Plan Recorded) Intravenous Once RSindy Guadeloupe MD 266 mL/hr at 09/14/20 1112 96 mg at 09/14/20 1112    VITAL SIGNS: There were no vitals taken for this visit. There were no vitals filed for this visit.  Estimated body mass index is 23.65 kg/m as calculated from the following:   Height as of 08/17/20: 6' 2"  (1.88 m).   Weight as of an earlier encounter on 09/14/20: 184 lb 3.2 oz (83.6 kg).  LABS: CBC:    Component Value Date/Time   WBC 8.3 09/14/2020 0831   HGB 14.7 09/14/2020 0831   HGB 14.5 12/04/2014 2201   HCT 42.3 09/14/2020 0831   HCT 43.4 12/04/2014 2201   PLT 355 09/14/2020 0831   PLT 261 12/04/2014 2201   MCV 85.3 09/14/2020 0831   MCV 93 12/04/2014 2201   NEUTROABS 5.3 09/14/2020 0831   NEUTROABS 10.7 (H) 12/04/2014 2201   LYMPHSABS 1.7 09/14/2020 0831   LYMPHSABS 2.1 12/04/2014 2201   MONOABS 0.9 09/14/2020 0831   MONOABS 1.5 (H) 12/04/2014 2201   EOSABS 0.3 09/14/2020 0831   EOSABS 0.3 12/04/2014 2201   BASOSABS 0.1 09/14/2020 0831   BASOSABS 0.1 12/04/2014 2201   Comprehensive Metabolic Panel:    Component Value Date/Time   NA 133 (L) 09/14/2020 0831   NA 140 12/04/2014 2201   K 4.6 09/14/2020 0831   K 4.2 12/04/2014 2201   CL 97 (L) 09/14/2020 0831   CL 108 (H) 12/04/2014 2201   CO2 26 09/14/2020 0831   CO2 26 12/04/2014 2201   BUN 6 09/14/2020 0831   BUN 6 (L) 12/04/2014 2201    CREATININE 0.93 09/14/2020 0831   CREATININE 0.91 12/04/2014 2201   GLUCOSE 91 09/14/2020 0831   GLUCOSE 100 (H) 12/04/2014 2201   CALCIUM 9.3 09/14/2020 0831   CALCIUM 8.6 12/04/2014 2201   AST 16 09/14/2020 0831   ALT 14 09/14/2020 0831   ALKPHOS 92 09/14/2020 0831   BILITOT 0.6 09/14/2020 0831   PROT 7.8 09/14/2020 0831   ALBUMIN 3.8 09/14/2020 0831    RADIOGRAPHIC STUDIES: MR Brain W Wo Contrast  Result Date: 08/25/2020 CLINICAL DATA:  Staging of non-small cell lung cancer. EXAM: MRI HEAD WITHOUT AND WITH CONTRAST TECHNIQUE: Multiplanar, multiecho pulse sequences of the brain and surrounding structures were obtained without and with intravenous contrast. CONTRAST:  822mGADAVIST GADOBUTROL 1 MMOL/ML IV SOLN COMPARISON:  Head CT 12/05/2014 FINDINGS: Brain: There is no evidence of an acute infarct, intracranial hemorrhage,  mass, midline shift, or extra-axial fluid collection. The ventricles and sulci are normal. 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 Scattered punctate foci of T2 hyperintensity elsewhere in the cerebral white matter bilaterally are nonspecific but compatible with minimal chronic small vessel ischemic disease. No abnormal enhancement is identified. Vascular: Major intracranial vascular flow voids are preserved. Skull and upper cervical spine: Unremarkable bone marrow signal. Sinuses/Orbits: Unremarkable orbits. Moderate bilateral ethmoid sinus mucosal thickening. Clear mastoid air cells. Other: None. IMPRESSION: No evidence of intracranial metastases. Electronically Signed   By: Logan Bores M.D.   On: 08/25/2020 12:35    PERFORMANCE STATUS (ECOG) : 0 - Asymptomatic  Review of Systems Unless otherwise noted, a complete review of systems is negative.  Physical Exam General: NAD Pulmonary: Unlabored Extremities: no edema, no joint deformities Skin: no rashes Neurological: nonfocal  IMPRESSION: Met with patient  infusion area.  I introduced palliative care services and attempted to establish therapeutic rapport.  Patient did not seem super interested in talking today.  He reports being at baseline and feels he is doing well overall.  He denies any significant changes or concerns.  He denies any symptomatic complaints today.  Patient says his goals are aligned with continued treatment.  We discussed resources available in the cancer center.  At baseline, patient lives at home with his mother.  He reports her health has been good and she is functionally independent.  He says he has a daughter who is available to assist him if needed.  He feels he has good social support.  He reports being functionally independent with all of his own care.  PLAN: -Continue current scope of treatment -RTC as needed   Patient expressed understanding and was in agreement with this plan. He also understands that He can call the clinic at any time with any questions, concerns, or complaints.     Time Total: 20 minutes  Visit consisted of counseling and education dealing with the complex and emotionally intense issues of symptom management and palliative care in the setting of serious and potentially life-threatening illness.Greater than 50%  of this time was spent counseling and coordinating care related to the above assessment and plan.  Signed by: Altha Harm, PhD, NP-C

## 2020-09-14 NOTE — Progress Notes (Signed)
Hematology/Oncology Consult note [facility]  [phone] [phone]  Patient Care Team: Patient, No Pcp Per as PCP - General ([place]) Telford Nab, RN as Oncology Nurse Navigator   Name of the patient: Wesley Ferguson  [id number]  12/05/1966   Date of visit: 09/14/20  Diagnosis- Squamous cell carcinoma of the left upper lobe of the lung stage III aT3 N1 M0   Chief complaint/ Reason for visit-on treatment assessment prior to cycle 1 of weekly carbotaxol chemotherapy  Heme/Onc history: Patient is a 54 year old male who was admitted to the hospital in July 2021 with symptoms of chest pain and streaky hemoptysis. He had a chest x-ray followed by a CT scan which showed a 5.7 x 4.4 cm left upper lobe mass along with left hilar adenopathy. There was a low-density 3.6 lesion noted in the right hepatic lobe which ultimately turned out to be a hemangioma on MRI liver. PET CT scan showed hypermetabolic activity in the left upper lobe and left hilar nodal tissue. Subcarinal and right paratracheal lymph nodes with mild hypermetabolic features.  Patient underwent bronchoscopies and biopsies.Left upper lobe biopsy was consistent with non-small cell carcinoma favor squamous cell carcinoma right subcarinal lymph node biopsy was negative for malignancy.  Plan is for concurrent chemoradiation with carbotaxol followed by maintenance durvalumab.  Patient does not want to get a port placed.  He is also refused Covid vaccination  Interval history-patient reports feeling well and denies any complaints at this time  ECOG PS- 0 Pain scale- 0   Review of systems- Review of Systems  Constitutional: Negative for chills, fever, malaise/fatigue and weight loss.  HENT: Negative for congestion, ear discharge and nosebleeds.   Eyes: Negative for blurred vision.  Respiratory: Negative for cough, hemoptysis, sputum production, shortness of breath and  wheezing.   Cardiovascular: Negative for chest pain, palpitations, orthopnea and claudication.  Gastrointestinal: Negative for abdominal pain, blood in stool, constipation, diarrhea, heartburn, melena, nausea and vomiting.  Genitourinary: Negative for dysuria, flank pain, frequency, hematuria and urgency.  Musculoskeletal: Negative for back pain, joint pain and myalgias.  Skin: Negative for rash.  Neurological: Negative for dizziness, tingling, focal weakness, seizures, weakness and headaches.  Endo/Heme/Allergies: Does not bruise/bleed easily.  Psychiatric/Behavioral: Negative for depression and suicidal ideas. The patient does not have insomnia.       No Known Allergies   Past Medical History:  Diagnosis Date  . COPD (chronic obstructive pulmonary disease) ([place])   . Emphysema of lung ([place])   . Pneumonia      Past Surgical History:  Procedure Laterality Date  . BACK SURGERY  2017   lumbar region herniated disc  . VIDEO BRONCHOSCOPY WITH ENDOBRONCHIAL NAVIGATION N/A 08/07/2020   Procedure: VIDEO BRONCHOSCOPY WITH ENDOBRONCHIAL NAVIGATION;  Surgeon: Tyler Pita, MD;  Location: [facility] ORS;  Service: Pulmonary;  Laterality: N/A;  . VIDEO BRONCHOSCOPY WITH ENDOBRONCHIAL ULTRASOUND N/A 08/07/2020   Procedure: VIDEO BRONCHOSCOPY WITH ENDOBRONCHIAL ULTRASOUND;  Surgeon: Tyler Pita, MD;  Location: [facility] ORS;  Service: Pulmonary;  Laterality: N/A;    Social History   Socioeconomic History  . Marital status: Divorced    Spouse name: Not on file  . Number of children: Not on file  . Years of education: Not on file  . Highest education level: Not on file  Occupational History  . Not on file  Tobacco Use  . Smoking status: Current Every Day Smoker    Packs/day: 2.00    Years: 35.00  Pack years: 70.00    Types: Cigarettes  . Smokeless tobacco: Former Systems developer    Types: Chew  . Tobacco comment: was smoking 3 ppd but down to 2 pack per day--07/29/2020  Vaping Use  . Vaping  Use: Never used  Substance and Sexual Activity  . Alcohol use: Yes    Alcohol/week: 14.0 standard drinks    Types: 14 Cans of beer per week  . Drug use: No  . Sexual activity: Not on file  Other Topics Concern  . Not on file  Social History Narrative  . Not on file   Social Determinants of Health   Financial Resource Strain:   . Difficulty of Paying Living Expenses: Not on file  Food Insecurity:   . Worried About Charity fundraiser in the Last Year: Not on file  . Ran Out of Food in the Last Year: Not on file  Transportation Needs:   . Lack of Transportation (Medical): Not on file  . Lack of Transportation (Non-Medical): Not on file  Physical Activity:   . Days of Exercise per Week: Not on file  . Minutes of Exercise per Session: Not on file  Stress:   . Feeling of Stress : Not on file  Social Connections:   . Frequency of Communication with Friends and Family: Not on file  . Frequency of Social Gatherings with Friends and Family: Not on file  . Attends Religious Services: Not on file  . Active Member of Clubs or Organizations: Not on file  . Attends Archivist Meetings: Not on file  . Marital Status: Not on file  Intimate Partner Violence:   . Fear of Current or Ex-Partner: Not on file  . Emotionally Abused: Not on file  . Physically Abused: Not on file  . Sexually Abused: Not on file    Family History  Problem Relation Age of Onset  . Diabetes Father   . Lung cancer Father 57  . Heart attack Father   . Throat cancer Maternal Aunt      Current Outpatient Medications:  .  acetaminophen (TYLENOL) 500 MG tablet, Take 500-1,000 mg by mouth every 6 (six) hours as needed (for pain.)., Disp: , Rfl:  .  Tiotropium Bromide-Olodaterol (STIOLTO RESPIMAT) 2.5-2.5 MCG/ACT AERS, Inhale 2 puffs into the lungs daily., Disp: , Rfl:  .  dexamethasone (DECADRON) 4 MG tablet, Take 2 tablets (8 mg total) by mouth daily. Start the day after chemotherapy for 2 days. (Patient  not taking: Reported on 09/14/2020), Disp: 30 tablet, Rfl: 1 .  lidocaine-prilocaine (EMLA) cream, Apply to affected area once (Patient not taking: Reported on 09/14/2020), Disp: 30 g, Rfl: 3 .  ondansetron (ZOFRAN) 8 MG tablet, Take 1 tablet (8 mg total) by mouth 2 (two) times daily as needed for refractory nausea / vomiting. Start on day 3 after chemo. (Patient not taking: Reported on 09/14/2020), Disp: 30 tablet, Rfl: 1 .  prochlorperazine (COMPAZINE) 10 MG tablet, Take 1 tablet (10 mg total) by mouth every 6 (six) hours as needed (Nausea or vomiting). (Patient not taking: Reported on 09/14/2020), Disp: 30 tablet, Rfl: 1 No current facility-administered medications for this visit.  Facility-Administered Medications Ordered in Other Visits:  .  CARBOplatin (PARAPLATIN) 270 mg in sodium chloride 0.9 % 250 mL chemo infusion, 270 mg, Intravenous, Once, Sindy Guadeloupe, MD .  dexamethasone (DECADRON) 20 mg in sodium chloride 0.9 % 50 mL IVPB, 20 mg, Intravenous, Once, Sindy Guadeloupe, MD .  famotidine (PEPCID)  IVPB 20 mg premix, 20 mg, Intravenous, Once, Sindy Guadeloupe, MD, Last Rate: 200 mL/hr at 09/14/20 1000, 20 mg at 09/14/20 1000 .  PACLitaxel (TAXOL) 96 mg in sodium chloride 0.9 % 250 mL chemo infusion (</= 80mg /m2), 45 mg/m2 (Treatment Plan Recorded), Intravenous, Once, Sindy Guadeloupe, MD  Physical exam:  Vitals:   09/14/20 0908  BP: (!) 138/92  Pulse: 71  Resp: 16  Temp: (!) 97 F (36.1 C)  TempSrc: Tympanic  SpO2: 98%  Weight: 184 lb 3.2 oz (83.6 kg)   Physical Exam Constitutional:      General: He is not in acute distress. Cardiovascular:     Rate and Rhythm: Normal rate and regular rhythm.     Heart sounds: Normal heart sounds.  Pulmonary:     Effort: Pulmonary effort is normal.     Breath sounds: Normal breath sounds.  Abdominal:     General: Bowel sounds are normal.     Palpations: Abdomen is soft.  Skin:    General: Skin is warm and dry.  Neurological:     Mental Status:  He is alert and oriented to person, place, and time.      CMP Latest Ref Rng & Units 09/14/2020  Glucose 70 - 99 mg/dL 91  BUN 6 - 20 mg/dL 6  Creatinine 0.61 - 1.24 mg/dL 0.93  Sodium 135 - 145 mmol/L 133(L)  Potassium 3.5 - 5.1 mmol/L 4.6  Chloride 98 - 111 mmol/L 97(L)  CO2 22 - 32 mmol/L 26  Calcium 8.9 - 10.3 mg/dL 9.3  Total Protein 6.5 - 8.1 g/dL 7.8  Total Bilirubin 0.3 - 1.2 mg/dL 0.6  Alkaline Phos 38 - 126 U/L 92  AST 15 - 41 U/L 16  ALT 0 - 44 U/L 14   CBC Latest Ref Rng & Units 09/14/2020  WBC 4.0 - 10.5 K/uL 8.3  Hemoglobin 13.0 - 17.0 g/dL 14.7  Hematocrit 39 - 52 % 42.3  Platelets 150 - 400 K/uL 355    No images are attached to the encounter.  MR Brain W Wo Contrast  Result Date: 08/25/2020 CLINICAL DATA:  Staging of non-small cell lung cancer. EXAM: MRI HEAD WITHOUT AND WITH CONTRAST TECHNIQUE: Multiplanar, multiecho pulse sequences of the brain and surrounding structures were obtained without and with intravenous contrast. CONTRAST:  31mL GADAVIST GADOBUTROL 1 MMOL/ML IV SOLN COMPARISON:  Head CT 12/05/2014 FINDINGS: Brain: There is no evidence of an acute infarct, intracranial hemorrhage, mass, midline shift, or extra-axial fluid collection. The ventricles and sulci are normal. 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 Scattered punctate foci of T2 hyperintensity elsewhere in the cerebral white matter bilaterally are nonspecific but compatible with minimal chronic small vessel ischemic disease. No abnormal enhancement is identified. Vascular: Major intracranial vascular flow voids are preserved. Skull and upper cervical spine: Unremarkable bone marrow signal. Sinuses/Orbits: Unremarkable orbits. Moderate bilateral ethmoid sinus mucosal thickening. Clear mastoid air cells. Other: None. IMPRESSION: No evidence of intracranial metastases. Electronically Signed   By: Logan Bores M.D.   On: 08/25/2020 12:35      Assessment and plan- Patient is a 54 y.o. male with h/o stage III squamous cell carcinoma of the left upper lobe T3 N1 M0.  He is here for on treatment assessment prior to cycle 1 of weekly carbotaxol chemotherapy  Counts okay to proceed with cycle 1 of weekly carbotaxol chemotherapy today.  I will see him back in 1 week for cycle 2.  Plan is for concurrent chemoradiation followed by repeat scans and maintenance durvalumab.  Patient does not want to get a port placed and will therefore get peripheral take each time for blood draws and chemotherapy.  He is also refused Covid vaccination.  Baseline MRI brain was negative for metastatic disease.   Visit Diagnosis 1. Encounter for antineoplastic chemotherapy   2. Malignant neoplasm of unspecified part of unspecified bronchus or lung ([place])      Dr. Randa Evens, MD, MPH [facility] at [facility] [place] [phone] 09/14/2020 10:04 AM

## 2020-09-14 NOTE — Progress Notes (Signed)
Patient tolerated first time taxol infusion well. Patient and VSS. Educated patient to contact clinic should he have any symptoms. Patient verbalizes understanding and denies any further questions or concerns. Patient discharged home.

## 2020-09-14 NOTE — Progress Notes (Signed)
  Oncology Nurse Navigator Documentation  Navigator Location: [facility] (09/14/20 1000)   )Navigator Encounter Type: Follow-up Appt;Treatment (09/14/20 1000)                   Treatment Initiated Date: 09/14/20 (09/14/20 1000) Patient Visit Type: MedOnc (09/14/20 1000) Treatment Phase: First Chemo Tx;First Radiation Tx (09/14/20 1000) Barriers/Navigation Needs: No Barriers At This Time (09/14/20 1000)   Interventions: None Required (09/14/20 1000)           met with patient during follow up visit with Dr. Janese Banks to start treatment with concurrent chemo/radiation today. Pt did not have any questions during visit. Reviewed upcoming appts. Instructed to call if has any questions or needs. Pt verbalized understanding.           Time Spent with Patient: 30 (09/14/20 1000)

## 2020-09-15 ENCOUNTER — Ambulatory Visit
Admission: RE | Admit: 2020-09-15 | Discharge: 2020-09-15 | Disposition: A | Discharge status: Home / Self Care | Payer: Medicaid Other | Source: Ambulatory Visit | Attending: Radiation Oncology | Admitting: Radiation Oncology

## 2020-09-15 ENCOUNTER — Telehealth: Payer: Self-pay

## 2020-09-15 ENCOUNTER — Ambulatory Visit: Payer: Medicaid Other

## 2020-09-15 DIAGNOSIS — C3412 Malignant neoplasm of upper lobe, left bronchus or lung: Secondary | ICD-10-CM | POA: Diagnosis not present

## 2020-09-15 LAB — HEPATITIS B SURFACE ANTIBODY, QUANTITATIVE: Hep B S AB Quant (Post): <3.1 m[IU]/mL — ABNORMAL LOW (ref >9.9)

## 2020-09-15 NOTE — Telephone Encounter (Signed)
Telephone call to patient for follow up after receiving first infusion.   Patient daughter states infusion went great.  States eating good and drinking plenty of fluids.   Denies any nausea or vomiting.  Encouraged patient to call for any concerns or questions.

## 2020-09-16 ENCOUNTER — Ambulatory Visit: Payer: Medicaid Other

## 2020-09-16 ENCOUNTER — Ambulatory Visit
Admission: RE | Admit: 2020-09-16 | Discharge: 2020-09-16 | Disposition: A | Discharge status: Home / Self Care | Payer: Medicaid Other | Source: Ambulatory Visit | Attending: Radiation Oncology | Admitting: Radiation Oncology

## 2020-09-16 DIAGNOSIS — C3412 Malignant neoplasm of upper lobe, left bronchus or lung: Secondary | ICD-10-CM | POA: Diagnosis not present

## 2020-09-17 ENCOUNTER — Ambulatory Visit: Payer: Medicaid Other

## 2020-09-17 ENCOUNTER — Ambulatory Visit: Payer: Self-pay | Admitting: Adult Health

## 2020-09-17 ENCOUNTER — Ambulatory Visit
Admission: RE | Admit: 2020-09-17 | Discharge: 2020-09-17 | Disposition: A | Discharge status: Home / Self Care | Payer: Medicaid Other | Source: Ambulatory Visit | Attending: Radiation Oncology | Admitting: Radiation Oncology

## 2020-09-17 DIAGNOSIS — C3412 Malignant neoplasm of upper lobe, left bronchus or lung: Secondary | ICD-10-CM | POA: Diagnosis not present

## 2020-09-18 ENCOUNTER — Ambulatory Visit
Admission: RE | Admit: 2020-09-18 | Discharge: 2020-09-18 | Disposition: A | Discharge status: Home / Self Care | Payer: Medicaid Other | Source: Ambulatory Visit | Attending: Radiation Oncology | Admitting: Radiation Oncology

## 2020-09-18 DIAGNOSIS — C3412 Malignant neoplasm of upper lobe, left bronchus or lung: Secondary | ICD-10-CM | POA: Diagnosis not present

## 2020-09-21 ENCOUNTER — Ambulatory Visit
Admission: RE | Admit: 2020-09-21 | Discharge: 2020-09-21 | Disposition: A | Discharge status: Home / Self Care | Payer: Medicaid Other | Source: Ambulatory Visit | Attending: Radiation Oncology | Admitting: Radiation Oncology

## 2020-09-21 ENCOUNTER — Inpatient Hospital Stay: Payer: Medicaid Other

## 2020-09-21 ENCOUNTER — Ambulatory Visit: Payer: Self-pay

## 2020-09-21 ENCOUNTER — Other Ambulatory Visit: Payer: Self-pay

## 2020-09-21 ENCOUNTER — Ambulatory Visit: Payer: Self-pay | Admitting: Oncology

## 2020-09-21 ENCOUNTER — Encounter: Payer: Self-pay | Admitting: *Deleted

## 2020-09-21 ENCOUNTER — Ambulatory Visit: Payer: Medicaid Other

## 2020-09-21 ENCOUNTER — Encounter: Payer: Self-pay | Admitting: Oncology

## 2020-09-21 ENCOUNTER — Inpatient Hospital Stay (HOSPITAL_BASED_OUTPATIENT_CLINIC_OR_DEPARTMENT_OTHER): Payer: Medicaid Other | Admitting: Oncology

## 2020-09-21 VITALS — BP 122/73 | HR 73 | Temp 98.1°F | Resp 16 | Ht 74.0 in | Wt 187.8 lb

## 2020-09-21 DIAGNOSIS — Z5111 Encounter for antineoplastic chemotherapy: Secondary | ICD-10-CM

## 2020-09-21 DIAGNOSIS — Z87891 Personal history of nicotine dependence: Secondary | ICD-10-CM | POA: Diagnosis not present

## 2020-09-21 DIAGNOSIS — H9311 Tinnitus, right ear: Secondary | ICD-10-CM

## 2020-09-21 DIAGNOSIS — C3490 Malignant neoplasm of unspecified part of unspecified bronchus or lung: Secondary | ICD-10-CM

## 2020-09-21 DIAGNOSIS — C3412 Malignant neoplasm of upper lobe, left bronchus or lung: Secondary | ICD-10-CM | POA: Diagnosis not present

## 2020-09-21 LAB — CBC WITH DIFFERENTIAL/PLATELET
Abs Immature Granulocytes: 0.05 10*3/uL (ref 0.00–0.07)
Basophils Absolute: 0.1 10*3/uL (ref 0.0–0.1)
Basophils Relative: 1 %
Eosinophils Absolute: 0.4 10*3/uL (ref 0.0–0.5)
Eosinophils Relative: 5 %
HCT: 40.6 % (ref 39.0–52.0)
Hemoglobin: 13.9 g/dL (ref 13.0–17.0)
Immature Granulocytes: 1 %
Lymphocytes Relative: 17 %
Lymphs Abs: 1.2 10*3/uL (ref 0.7–4.0)
MCH: 29.3 pg (ref 26.0–34.0)
MCHC: 34.2 g/dL (ref 30.0–36.0)
MCV: 85.5 fL (ref 80.0–100.0)
Monocytes Absolute: 0.8 10*3/uL (ref 0.1–1.0)
Monocytes Relative: 12 %
Neutro Abs: 4.5 10*3/uL (ref 1.7–7.7)
Neutrophils Relative %: 64 %
Platelets: 335 10*3/uL (ref 150–400)
RBC: 4.75 MIL/uL (ref 4.22–5.81)
RDW: 13.6 % (ref 11.5–15.5)
WBC: 6.9 10*3/uL (ref 4.0–10.5)
nRBC: 0 % (ref 0.0–0.2)

## 2020-09-21 LAB — COMPREHENSIVE METABOLIC PANEL
ALT: 14 U/L (ref 0–44)
AST: 15 U/L (ref 15–41)
Albumin: 3.8 g/dL (ref 3.5–5.0)
Alkaline Phosphatase: 76 U/L (ref 38–126)
Anion gap: 6 (ref 5–15)
BUN: 9 mg/dL (ref 6–20)
CO2: 26 mmol/L (ref 22–32)
Calcium: 8.8 mg/dL — ABNORMAL LOW (ref 8.9–10.3)
Chloride: 99 mmol/L (ref 98–111)
Creatinine, Ser: 0.72 mg/dL (ref 0.61–1.24)
GFR calc Af Amer: >60 mL/min (ref >60)
GFR calc non Af Amer: >60 mL/min (ref >60)
Glucose, Bld: 95 mg/dL (ref 70–99)
Potassium: 4.9 mmol/L (ref 3.5–5.1)
Sodium: 131 mmol/L — ABNORMAL LOW (ref 135–145)
Total Bilirubin: 0.6 mg/dL (ref 0.3–1.2)
Total Protein: 7.5 g/dL (ref 6.5–8.1)

## 2020-09-21 MED ORDER — SODIUM CHLORIDE 0.9 % IV SOLN
300.0000 mg | Freq: Once | INTRAVENOUS | Status: AC
  Administered 2020-09-21: 300 mg via INTRAVENOUS
  Filled 2020-09-21: qty 30

## 2020-09-21 MED ORDER — DIPHENHYDRAMINE HCL 50 MG/ML IJ SOLN
50.0000 mg | Freq: Once | INTRAMUSCULAR | Status: AC
  Administered 2020-09-21: 50 mg via INTRAVENOUS
  Filled 2020-09-21: qty 1

## 2020-09-21 MED ORDER — SODIUM CHLORIDE 0.9 % IV SOLN
45.0000 mg/m2 | Freq: Once | INTRAVENOUS | Status: AC
  Administered 2020-09-21: 96 mg via INTRAVENOUS
  Filled 2020-09-21: qty 16

## 2020-09-21 MED ORDER — SODIUM CHLORIDE 0.9 % IV SOLN
Freq: Once | INTRAVENOUS | Status: AC
  Filled 2020-09-21: qty 250

## 2020-09-21 MED ORDER — SODIUM CHLORIDE 0.9 % IV SOLN
20.0000 mg | Freq: Once | INTRAVENOUS | Status: AC
  Administered 2020-09-21: 20 mg via INTRAVENOUS
  Filled 2020-09-21: qty 20

## 2020-09-21 MED ORDER — PALONOSETRON HCL INJECTION 0.25 MG/5ML
0.2500 mg | Freq: Once | INTRAVENOUS | Status: AC
  Administered 2020-09-21: 0.25 mg via INTRAVENOUS
  Filled 2020-09-21: qty 5

## 2020-09-21 MED ORDER — FAMOTIDINE IN NACL 20-0.9 MG/50ML-% IV SOLN
20.0000 mg | Freq: Once | INTRAVENOUS | Status: AC
  Administered 2020-09-21: 20 mg via INTRAVENOUS
  Filled 2020-09-21: qty 50

## 2020-09-21 NOTE — Progress Notes (Signed)
  Oncology Nurse Navigator Documentation  Navigator Location: [facility] (09/21/20 0900)   )Navigator Encounter Type: Lobby (09/21/20 0900)                     Patient Visit Type: MedOnc (09/21/20 0900) Treatment Phase: Treatment (09/21/20 0900) Barriers/Navigation Needs: No Barriers At This Time;No Needs;No Questions (09/21/20 0900)   Interventions: None Required (09/21/20 0900)             met with patient in the lobby after visit with Dr. Janese Banks this morning. No questions or needs at this time. Instructed pt to call with any further questions or needs. Pt verbalized understanding.         Time Spent with Patient: 15 (09/21/20 0900)

## 2020-09-21 NOTE — Progress Notes (Signed)
Pt here and says he is doing ok, cut down alcohol to about 8 beers a week. Tried coming off the cigarettes and could not do it - and back to 2 packs a day. Says that he has ringing of the ears that started last week and after chemo. Eating good, good bowels. No pain

## 2020-09-22 ENCOUNTER — Ambulatory Visit: Payer: Medicaid Other

## 2020-09-22 ENCOUNTER — Telehealth: Payer: Self-pay | Admitting: *Deleted

## 2020-09-22 ENCOUNTER — Ambulatory Visit
Admission: RE | Admit: 2020-09-22 | Discharge: 2020-09-22 | Disposition: A | Discharge status: Home / Self Care | Payer: Medicaid Other | Source: Ambulatory Visit | Attending: Radiation Oncology | Admitting: Radiation Oncology

## 2020-09-22 DIAGNOSIS — C3412 Malignant neoplasm of upper lobe, left bronchus or lung: Secondary | ICD-10-CM | POA: Diagnosis not present

## 2020-09-22 NOTE — Telephone Encounter (Signed)
Called and spoke to family and she will talk to pt and let us know if he wants ENT referral for his tinnitus. I gave her my direct number to call

## 2020-09-22 NOTE — Progress Notes (Signed)
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 He had a chest x-ray followed by a CT scan which showed a 5.7 x 4.4 cm left upper lobe mass along with left hilar adenopathy. There was a low-density 3.6 lesion noted in the right hepatic lobe which ultimately turned out to be a hemangioma on MRI liver. PET CT scan showed hypermetabolic activity in the left upper lobe and left hilar nodal tissue. Subcarinal and right paratracheal lymph nodes with mild hypermetabolic features.  Patient underwent bronchoscopies and biopsies.Left upper lobe biopsy was consistent with non-small cell carcinoma favor squamous cell carcinoma right subcarinal lymph node biopsy was negative for malignancy.  Plan is for concurrent chemoradiation with carbotaxol followed by maintenance durvalumab.  Patient does not want to get a port placed.  He is also refused Covid vaccination   Interval history-tolerated cycle 1 of carbotaxol chemotherapy well without any significant side effects.  Denies any specific complaints at this time other than tinnitus Which has been going on even prior to chemotherapy but has been bothering more so since he started chemo  ECOG PS-  Pain scale- 1 Opioid associated constipation- no  Review of systems- Review of Systems  Constitutional: Negative for chills, fever,  malaise/fatigue and weight loss.  HENT: Positive for tinnitus. Negative for congestion, ear discharge and nosebleeds.   Eyes: Negative for blurred vision.  Respiratory: Negative for cough, hemoptysis, sputum production, shortness of breath and wheezing.   Cardiovascular: Negative for chest pain, palpitations, orthopnea and claudication.  Gastrointestinal: Negative for abdominal pain, blood in stool, constipation, diarrhea, heartburn, melena, nausea and vomiting.  Genitourinary: Negative for dysuria, flank pain, frequency, hematuria and urgency.  Musculoskeletal: Negative for back pain, joint pain and myalgias.  Skin: Negative for rash.  Neurological: Negative for dizziness, tingling, focal weakness, seizures, weakness and headaches.  Endo/Heme/Allergies: Does not bruise/bleed easily.  Psychiatric/Behavioral: Negative for depression and suicidal ideas. The patient does not have insomnia.        No Known Allergies   Past Medical History:  Diagnosis Date  . COPD (chronic obstructive pulmonary disease) ([place])   . Emphysema of lung ([place])   . Lung cancer ([place])   . Pneumonia      Past Surgical History:  Procedure Laterality Date  . BACK SURGERY  2017   lumbar region herniated disc  . VIDEO BRONCHOSCOPY WITH ENDOBRONCHIAL NAVIGATION N/A 08/07/2020   Procedure: VIDEO BRONCHOSCOPY WITH ENDOBRONCHIAL NAVIGATION;  Surgeon: Tyler Pita, MD;  Location: [facility] ORS;  Service: Pulmonary;  Laterality: N/A;  . VIDEO BRONCHOSCOPY WITH ENDOBRONCHIAL ULTRASOUND N/A 08/07/2020   Procedure: VIDEO BRONCHOSCOPY WITH ENDOBRONCHIAL ULTRASOUND;  Surgeon: Tyler Pita, MD;  Location: [facility] ORS;  Service: Pulmonary;  Laterality: N/A;    Social History   Socioeconomic History  . Marital status: Divorced    Spouse name: Not on file  . Number of children: Not on file  .  Years of education: Not on file  . Highest education level: Not on file  Occupational History  . Not on file  Tobacco Use  .  Smoking status: Current Every Day Smoker    Packs/day: 2.00    Years: 35.00    Pack years: 70.00    Types: Cigarettes  . Smokeless tobacco: Former Systems developer    Types: Chew  . Tobacco comment: was smoking 3 ppd but down to 2 pack per day--07/29/2020  Vaping Use  . Vaping Use: Never used  Substance and Sexual Activity  . Alcohol use: Yes    Alcohol/week: 8.0 standard drinks    Types: 8 Cans of beer per week  . Drug use: No  . Sexual activity: Not on file  Other Topics Concern  . Not on file  Social History Narrative  . Not on file   Social Determinants of Health   Financial Resource Strain:   . Difficulty of Paying Living Expenses: Not on file  Food Insecurity:   . Worried About Charity fundraiser in the Last Year: Not on file  . Ran Out of Food in the Last Year: Not on file  Transportation Needs:   . Lack of Transportation (Medical): Not on file  . Lack of Transportation (Non-Medical): Not on file  Physical Activity:   . Days of Exercise per Week: Not on file  . Minutes of Exercise per Session: Not on file  Stress:   . Feeling of Stress : Not on file  Social Connections:   . Frequency of Communication with Friends and Family: Not on file  . Frequency of Social Gatherings with Friends and Family: Not on file  . Attends Religious Services: Not on file  . Active Member of Clubs or Organizations: Not on file  . Attends Archivist Meetings: Not on file  . Marital Status: Not on file  Intimate Partner Violence:   . Fear of Current or Ex-Partner: Not on file  . Emotionally Abused: Not on file  . Physically Abused: Not on file  . Sexually Abused: Not on file    Family History  Problem Relation Age of Onset  . Diabetes Father   . Lung cancer Father 41  . Heart attack Father   . Throat cancer Maternal Aunt      Current Outpatient Medications:  .  acetaminophen (TYLENOL) 500 MG tablet, Take 500-1,000 mg by mouth every 6 (six) hours as needed (for pain.)., Disp: ,  Rfl:  .  dexamethasone (DECADRON) 4 MG tablet, Take 2 tablets (8 mg total) by mouth daily. Start the day after chemotherapy for 2 days., Disp: 30 tablet, Rfl: 1 .  Tiotropium Bromide-Olodaterol (STIOLTO RESPIMAT) 2.5-2.5 MCG/ACT AERS, Inhale 2 puffs into the lungs daily., Disp: , Rfl:  .  ondansetron (ZOFRAN) 8 MG tablet, Take 1 tablet (8 mg total) by mouth 2 (two) times daily as needed for refractory nausea / vomiting. Start on day 3 after chemo. (Patient not taking: Reported on 09/14/2020), Disp: 30 tablet, Rfl: 1 .  prochlorperazine (COMPAZINE) 10 MG tablet, Take 1 tablet (10 mg total) by mouth every 6 (six) hours as needed (Nausea or vomiting). (Patient not taking: Reported on 09/14/2020), Disp: 30 tablet, Rfl: 1  Physical exam:  Vitals:   09/21/20 0911  BP: 122/73  Pulse: 73  Resp: 16  Temp: 98.1 F (36.7 C)  TempSrc: Oral  SpO2: 99%  Weight: 187 lb 12.8 oz (85.2 kg)  Height: 6\' 2"  (1.88 m)  Physical Exam Constitutional:      General: He is not in acute distress. Cardiovascular:     Rate and Rhythm: Normal rate and regular rhythm.     Heart sounds: Normal heart sounds.  Pulmonary:     Effort: Pulmonary effort is normal.     Breath sounds: Normal breath sounds.  Abdominal:     General: Bowel sounds are normal.     Palpations: Abdomen is soft.  Skin:    General: Skin is warm and dry.  Neurological:     Mental Status: He is alert and oriented to person, place, and time.      CMP Latest Ref Rng & Units 09/21/2020  Glucose 70 - 99 mg/dL 95  BUN 6 - 20 mg/dL 9  Creatinine 0.61 - 1.24 mg/dL 0.72  Sodium 135 - 145 mmol/L 131(L)  Potassium 3.5 - 5.1 mmol/L 4.9  Chloride 98 - 111 mmol/L 99  CO2 22 - 32 mmol/L 26  Calcium 8.9 - 10.3 mg/dL 8.8(L)  Total Protein 6.5 - 8.1 g/dL 7.5  Total Bilirubin 0.3 - 1.2 mg/dL 0.6  Alkaline Phos 38 - 126 U/L 76  AST 15 - 41 U/L 15  ALT 0 - 44 U/L 14   CBC Latest Ref Rng & Units 09/21/2020  WBC 4.0 - 10.5 K/uL 6.9  Hemoglobin 13.0 - 17.0  g/dL 13.9  Hematocrit 39 - 52 % 40.6  Platelets 150 - 400 K/uL 335    No images are attached to the encounter.  MR Brain W Wo Contrast  Result Date: 08/25/2020 CLINICAL DATA:  Staging of non-small cell lung cancer. EXAM: MRI HEAD WITHOUT AND WITH CONTRAST TECHNIQUE: Multiplanar, multiecho pulse sequences of the brain and surrounding structures were obtained without and with intravenous contrast. CONTRAST:  23mL GADAVIST GADOBUTROL 1 MMOL/ML IV SOLN COMPARISON:  Head CT 12/05/2014 FINDINGS: Brain: There is no evidence of an acute infarct, intracranial hemorrhage, mass, midline shift, or extra-axial fluid collection. The ventricles and sulci are normal. 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 Scattered punctate foci of T2 hyperintensity elsewhere in the cerebral white matter bilaterally are nonspecific but compatible with minimal chronic small vessel ischemic disease. No abnormal enhancement is identified. Vascular: Major intracranial vascular flow voids are preserved. Skull and upper cervical spine: Unremarkable bone marrow signal. Sinuses/Orbits: Unremarkable orbits. Moderate bilateral ethmoid sinus mucosal thickening. Clear mastoid air cells. Other: None. IMPRESSION: No evidence of intracranial metastases. Electronically Signed   By: Logan Bores M.D.   On: 08/25/2020 12:35     Assessment and plan- Patient is a 54 y.o. male with h/o stage III squamous cell carcinoma of the left upper lobe T3 N1 M0.   He is here for on treatment assessment prior to cycle  2 of weekly carbotaxol chemotherapy  Counts okay to proceed with cycle 2 of weekly carbotaxol chemotherapy today.  He will directly proceed for cycle 3 next week and I will see him back in 2 weeks for cycle 4.  Overall he is tolerating treatment well without any significant side effects.  Tinnitus: This has persisted even prior to start of treatment Patient is not currently on any ototoxic  drugs.  No signs and symptoms of depression.  He does report getting a good night sleep.  I will refer him to ENT if he is agreeable to rule out any middle or inner ear issues.  Visit Diagnosis 1. Encounter for antineoplastic chemotherapy   2. Malignant neoplasm of unspecified  part of unspecified bronchus or lung ([place])   3. Tinnitus of right ear      Dr. Randa Evens, MD, MPH [facility] [phone] 09/22/2020 9:43 AM

## 2020-09-23 ENCOUNTER — Ambulatory Visit
Admission: RE | Admit: 2020-09-23 | Discharge: 2020-09-23 | Disposition: A | Discharge status: Home / Self Care | Payer: Medicaid Other | Source: Ambulatory Visit | Attending: Radiation Oncology | Admitting: Radiation Oncology

## 2020-09-23 ENCOUNTER — Ambulatory Visit: Payer: Medicaid Other

## 2020-09-23 DIAGNOSIS — C3412 Malignant neoplasm of upper lobe, left bronchus or lung: Secondary | ICD-10-CM | POA: Diagnosis not present

## 2020-09-24 ENCOUNTER — Ambulatory Visit: Payer: Medicaid Other

## 2020-09-24 ENCOUNTER — Ambulatory Visit
Admission: RE | Admit: 2020-09-24 | Discharge: 2020-09-24 | Disposition: A | Discharge status: Home / Self Care | Payer: Medicaid Other | Source: Ambulatory Visit | Attending: Radiation Oncology | Admitting: Radiation Oncology

## 2020-09-24 DIAGNOSIS — C3412 Malignant neoplasm of upper lobe, left bronchus or lung: Secondary | ICD-10-CM | POA: Diagnosis not present

## 2020-09-25 ENCOUNTER — Ambulatory Visit
Admission: RE | Admit: 2020-09-25 | Discharge: 2020-09-25 | Disposition: A | Discharge status: Home / Self Care | Payer: Medicaid Other | Source: Ambulatory Visit | Attending: Radiation Oncology | Admitting: Radiation Oncology

## 2020-09-25 DIAGNOSIS — C778 Secondary and unspecified malignant neoplasm of lymph nodes of multiple regions: Secondary | ICD-10-CM | POA: Insufficient documentation

## 2020-09-25 DIAGNOSIS — C3412 Malignant neoplasm of upper lobe, left bronchus or lung: Secondary | ICD-10-CM | POA: Diagnosis not present

## 2020-09-28 ENCOUNTER — Other Ambulatory Visit: Payer: Self-pay

## 2020-09-28 ENCOUNTER — Ambulatory Visit: Payer: Medicaid Other

## 2020-09-28 ENCOUNTER — Inpatient Hospital Stay: Payer: Medicaid Other | Attending: Oncology

## 2020-09-28 ENCOUNTER — Ambulatory Visit
Admission: RE | Admit: 2020-09-28 | Discharge: 2020-09-28 | Disposition: A | Discharge status: Home / Self Care | Payer: Medicaid Other | Source: Ambulatory Visit | Attending: Radiation Oncology | Admitting: Radiation Oncology

## 2020-09-28 ENCOUNTER — Inpatient Hospital Stay: Payer: Medicaid Other

## 2020-09-28 ENCOUNTER — Telehealth: Payer: Self-pay | Admitting: *Deleted

## 2020-09-28 VITALS — BP 145/81 | HR 78 | Temp 96.8°F | Resp 17 | Wt 189.4 lb

## 2020-09-28 DIAGNOSIS — Z7289 Other problems related to lifestyle: Secondary | ICD-10-CM | POA: Insufficient documentation

## 2020-09-28 DIAGNOSIS — Z87891 Personal history of nicotine dependence: Secondary | ICD-10-CM | POA: Diagnosis not present

## 2020-09-28 DIAGNOSIS — E871 Hypo-osmolality and hyponatremia: Secondary | ICD-10-CM | POA: Diagnosis not present

## 2020-09-28 DIAGNOSIS — C3490 Malignant neoplasm of unspecified part of unspecified bronchus or lung: Secondary | ICD-10-CM

## 2020-09-28 DIAGNOSIS — Z5111 Encounter for antineoplastic chemotherapy: Secondary | ICD-10-CM | POA: Diagnosis not present

## 2020-09-28 DIAGNOSIS — Z8249 Family history of ischemic heart disease and other diseases of the circulatory system: Secondary | ICD-10-CM | POA: Insufficient documentation

## 2020-09-28 DIAGNOSIS — Z79899 Other long term (current) drug therapy: Secondary | ICD-10-CM | POA: Insufficient documentation

## 2020-09-28 DIAGNOSIS — Z801 Family history of malignant neoplasm of trachea, bronchus and lung: Secondary | ICD-10-CM | POA: Insufficient documentation

## 2020-09-28 DIAGNOSIS — K208 Other esophagitis without bleeding: Secondary | ICD-10-CM | POA: Insufficient documentation

## 2020-09-28 DIAGNOSIS — C3412 Malignant neoplasm of upper lobe, left bronchus or lung: Secondary | ICD-10-CM | POA: Diagnosis not present

## 2020-09-28 DIAGNOSIS — F17210 Nicotine dependence, cigarettes, uncomplicated: Secondary | ICD-10-CM | POA: Insufficient documentation

## 2020-09-28 DIAGNOSIS — Z833 Family history of diabetes mellitus: Secondary | ICD-10-CM | POA: Diagnosis not present

## 2020-09-28 DIAGNOSIS — C778 Secondary and unspecified malignant neoplasm of lymph nodes of multiple regions: Secondary | ICD-10-CM | POA: Diagnosis not present

## 2020-09-28 LAB — CBC WITH DIFFERENTIAL/PLATELET
Abs Immature Granulocytes: 0.05 10*3/uL (ref 0.00–0.07)
Basophils Absolute: 0.1 10*3/uL (ref 0.0–0.1)
Basophils Relative: 1 %
Eosinophils Absolute: 0.3 10*3/uL (ref 0.0–0.5)
Eosinophils Relative: 5 %
HCT: 38.4 % — ABNORMAL LOW (ref 39.0–52.0)
Hemoglobin: 13.2 g/dL (ref 13.0–17.0)
Immature Granulocytes: 1 %
Lymphocytes Relative: 19 %
Lymphs Abs: 1 10*3/uL (ref 0.7–4.0)
MCH: 29.7 pg (ref 26.0–34.0)
MCHC: 34.4 g/dL (ref 30.0–36.0)
MCV: 86.3 fL (ref 80.0–100.0)
Monocytes Absolute: 0.6 10*3/uL (ref 0.1–1.0)
Monocytes Relative: 12 %
Neutro Abs: 3.3 10*3/uL (ref 1.7–7.7)
Neutrophils Relative %: 62 %
Platelets: 296 10*3/uL (ref 150–400)
RBC: 4.45 MIL/uL (ref 4.22–5.81)
RDW: 14 % (ref 11.5–15.5)
WBC: 5.2 10*3/uL (ref 4.0–10.5)
nRBC: 0 % (ref 0.0–0.2)

## 2020-09-28 LAB — COMPREHENSIVE METABOLIC PANEL
ALT: 16 U/L (ref 0–44)
AST: 16 U/L (ref 15–41)
Albumin: 3.5 g/dL (ref 3.5–5.0)
Alkaline Phosphatase: 76 U/L (ref 38–126)
Anion gap: 7 (ref 5–15)
BUN: 9 mg/dL (ref 6–20)
CO2: 26 mmol/L (ref 22–32)
Calcium: 8.3 mg/dL — ABNORMAL LOW (ref 8.9–10.3)
Chloride: 100 mmol/L (ref 98–111)
Creatinine, Ser: 0.77 mg/dL (ref 0.61–1.24)
GFR calc Af Amer: >60 mL/min (ref >60)
GFR calc non Af Amer: >60 mL/min (ref >60)
Glucose, Bld: 98 mg/dL (ref 70–99)
Potassium: 4 mmol/L (ref 3.5–5.1)
Sodium: 133 mmol/L — ABNORMAL LOW (ref 135–145)
Total Bilirubin: 0.5 mg/dL (ref 0.3–1.2)
Total Protein: 7 g/dL (ref 6.5–8.1)

## 2020-09-28 MED ORDER — SODIUM CHLORIDE 0.9 % IV SOLN
300.0000 mg | Freq: Once | INTRAVENOUS | Status: AC
  Administered 2020-09-28: 300 mg via INTRAVENOUS
  Filled 2020-09-28: qty 30

## 2020-09-28 MED ORDER — SODIUM CHLORIDE 0.9 % IV SOLN
Freq: Once | INTRAVENOUS | Status: AC
  Filled 2020-09-28: qty 250

## 2020-09-28 MED ORDER — SODIUM CHLORIDE 0.9 % IV SOLN
20.0000 mg | Freq: Once | INTRAVENOUS | Status: AC
  Administered 2020-09-28: 20 mg via INTRAVENOUS
  Filled 2020-09-28: qty 20

## 2020-09-28 MED ORDER — SODIUM CHLORIDE 0.9 % IV SOLN
45.0000 mg/m2 | Freq: Once | INTRAVENOUS | Status: AC
  Administered 2020-09-28: 96 mg via INTRAVENOUS
  Filled 2020-09-28: qty 16

## 2020-09-28 MED ORDER — DIPHENHYDRAMINE HCL 50 MG/ML IJ SOLN
50.0000 mg | Freq: Once | INTRAMUSCULAR | Status: AC
  Administered 2020-09-28: 50 mg via INTRAVENOUS
  Filled 2020-09-28: qty 1

## 2020-09-28 MED ORDER — FAMOTIDINE IN NACL 20-0.9 MG/50ML-% IV SOLN
20.0000 mg | Freq: Once | INTRAVENOUS | Status: AC
  Administered 2020-09-28: 20 mg via INTRAVENOUS
  Filled 2020-09-28: qty 50

## 2020-09-28 MED ORDER — PALONOSETRON HCL INJECTION 0.25 MG/5ML
0.2500 mg | Freq: Once | INTRAVENOUS | Status: AC
  Administered 2020-09-28: 0.25 mg via INTRAVENOUS
  Filled 2020-09-28: qty 5

## 2020-09-28 NOTE — Telephone Encounter (Signed)
I had called last week to see if pt wanted to have ENT ref for his tinnitus. Patient was at the cancer center today and I asked Ana if she could ask him if he has decided about the referral and how the ringing is now. Ana spoke to him and he said: He said it is better but still there. He doesn't want a referral right now. He will let us know if he changes his mind

## 2020-09-29 ENCOUNTER — Ambulatory Visit: Payer: Medicaid Other

## 2020-09-29 ENCOUNTER — Ambulatory Visit
Admission: RE | Admit: 2020-09-29 | Discharge: 2020-09-29 | Disposition: A | Discharge status: Home / Self Care | Payer: Medicaid Other | Source: Ambulatory Visit | Attending: Radiation Oncology | Admitting: Radiation Oncology

## 2020-09-29 ENCOUNTER — Telehealth: Payer: Self-pay | Admitting: *Deleted

## 2020-09-29 ENCOUNTER — Other Ambulatory Visit: Payer: Self-pay | Admitting: *Deleted

## 2020-09-29 DIAGNOSIS — C778 Secondary and unspecified malignant neoplasm of lymph nodes of multiple regions: Secondary | ICD-10-CM | POA: Diagnosis not present

## 2020-09-29 DIAGNOSIS — C3412 Malignant neoplasm of upper lobe, left bronchus or lung: Secondary | ICD-10-CM | POA: Diagnosis not present

## 2020-09-29 MED ORDER — SUCRALFATE 1 G PO TABS: 1.0000 g | ORAL_TABLET | Freq: Three times a day (TID) | ORAL | 1 refills | Status: DC

## 2020-09-29 NOTE — Telephone Encounter (Signed)
Pt called and left message at 4:37 pm. I called her back today about medication for her dad that was suppose to be sent to pharmacy. Dr. Baruch Gouty sent a RX today for carafate. [facility] is [facility] per pt request. IF he continues to have abdominal pain then dr Janese Banks said that we can give him Bentyl. I have left this message with my phone number if she needs anything

## 2020-09-29 NOTE — Progress Notes (Signed)
09/28/2020 at 1315: pt reports that since his last treatment, he has been experiencing lower abdominal cramping and indigestion. Dr. Janese Banks aware. Per Dr. Janese Banks a prescription will be called into his pharmacy, pt aware and verbalizes understanding.

## 2020-09-30 ENCOUNTER — Ambulatory Visit
Admission: RE | Admit: 2020-09-30 | Discharge: 2020-09-30 | Disposition: A | Discharge status: Home / Self Care | Payer: Medicaid Other | Source: Ambulatory Visit | Attending: Radiation Oncology | Admitting: Radiation Oncology

## 2020-09-30 DIAGNOSIS — C778 Secondary and unspecified malignant neoplasm of lymph nodes of multiple regions: Secondary | ICD-10-CM | POA: Diagnosis not present

## 2020-09-30 DIAGNOSIS — C3412 Malignant neoplasm of upper lobe, left bronchus or lung: Secondary | ICD-10-CM | POA: Diagnosis not present

## 2020-10-01 ENCOUNTER — Ambulatory Visit
Admission: RE | Admit: 2020-10-01 | Discharge: 2020-10-01 | Disposition: A | Discharge status: Home / Self Care | Payer: Medicaid Other | Source: Ambulatory Visit | Attending: Radiation Oncology | Admitting: Radiation Oncology

## 2020-10-01 DIAGNOSIS — C778 Secondary and unspecified malignant neoplasm of lymph nodes of multiple regions: Secondary | ICD-10-CM | POA: Diagnosis not present

## 2020-10-01 DIAGNOSIS — C3412 Malignant neoplasm of upper lobe, left bronchus or lung: Secondary | ICD-10-CM | POA: Diagnosis not present

## 2020-10-02 ENCOUNTER — Ambulatory Visit
Admission: RE | Admit: 2020-10-02 | Discharge: 2020-10-02 | Disposition: A | Discharge status: Home / Self Care | Payer: Medicaid Other | Source: Ambulatory Visit | Attending: Radiation Oncology | Admitting: Radiation Oncology

## 2020-10-02 DIAGNOSIS — C778 Secondary and unspecified malignant neoplasm of lymph nodes of multiple regions: Secondary | ICD-10-CM | POA: Diagnosis not present

## 2020-10-02 DIAGNOSIS — C3412 Malignant neoplasm of upper lobe, left bronchus or lung: Secondary | ICD-10-CM | POA: Diagnosis not present

## 2020-10-05 ENCOUNTER — Inpatient Hospital Stay: Payer: Medicaid Other

## 2020-10-05 ENCOUNTER — Ambulatory Visit
Admission: RE | Admit: 2020-10-05 | Discharge: 2020-10-05 | Disposition: A | Discharge status: Home / Self Care | Payer: Medicaid Other | Source: Ambulatory Visit | Attending: Radiation Oncology | Admitting: Radiation Oncology

## 2020-10-05 ENCOUNTER — Inpatient Hospital Stay (HOSPITAL_BASED_OUTPATIENT_CLINIC_OR_DEPARTMENT_OTHER): Payer: Medicaid Other | Admitting: Oncology

## 2020-10-05 ENCOUNTER — Ambulatory Visit: Payer: Self-pay | Admitting: Oncology

## 2020-10-05 ENCOUNTER — Other Ambulatory Visit: Payer: Self-pay

## 2020-10-05 VITALS — BP 113/74 | HR 74 | Temp 96.6°F | Resp 18 | Wt 190.7 lb

## 2020-10-05 DIAGNOSIS — T66XXXA Radiation sickness, unspecified, initial encounter: Secondary | ICD-10-CM

## 2020-10-05 DIAGNOSIS — Z5111 Encounter for antineoplastic chemotherapy: Secondary | ICD-10-CM

## 2020-10-05 DIAGNOSIS — C778 Secondary and unspecified malignant neoplasm of lymph nodes of multiple regions: Secondary | ICD-10-CM | POA: Diagnosis not present

## 2020-10-05 DIAGNOSIS — C3412 Malignant neoplasm of upper lobe, left bronchus or lung: Secondary | ICD-10-CM | POA: Diagnosis not present

## 2020-10-05 DIAGNOSIS — K208 Other esophagitis without bleeding: Secondary | ICD-10-CM

## 2020-10-05 DIAGNOSIS — Z87891 Personal history of nicotine dependence: Secondary | ICD-10-CM | POA: Diagnosis not present

## 2020-10-05 DIAGNOSIS — C3490 Malignant neoplasm of unspecified part of unspecified bronchus or lung: Secondary | ICD-10-CM

## 2020-10-05 LAB — CBC WITH DIFFERENTIAL/PLATELET
Abs Immature Granulocytes: 0.05 10*3/uL (ref 0.00–0.07)
Basophils Absolute: 0.1 10*3/uL (ref 0.0–0.1)
Basophils Relative: 1 %
Eosinophils Absolute: 0.1 10*3/uL (ref 0.0–0.5)
Eosinophils Relative: 2 %
HCT: 39.6 % (ref 39.0–52.0)
Hemoglobin: 13.9 g/dL (ref 13.0–17.0)
Immature Granulocytes: 1 %
Lymphocytes Relative: 13 %
Lymphs Abs: 0.8 10*3/uL (ref 0.7–4.0)
MCH: 30.2 pg (ref 26.0–34.0)
MCHC: 35.1 g/dL (ref 30.0–36.0)
MCV: 85.9 fL (ref 80.0–100.0)
Monocytes Absolute: 0.6 10*3/uL (ref 0.1–1.0)
Monocytes Relative: 10 %
Neutro Abs: 4.6 10*3/uL (ref 1.7–7.7)
Neutrophils Relative %: 73 %
Platelets: 258 10*3/uL (ref 150–400)
RBC: 4.61 MIL/uL (ref 4.22–5.81)
RDW: 14 % (ref 11.5–15.5)
WBC: 6.3 10*3/uL (ref 4.0–10.5)
nRBC: 0 % (ref 0.0–0.2)

## 2020-10-05 LAB — COMPREHENSIVE METABOLIC PANEL
ALT: 16 U/L (ref 0–44)
AST: 15 U/L (ref 15–41)
Albumin: 3.8 g/dL (ref 3.5–5.0)
Alkaline Phosphatase: 67 U/L (ref 38–126)
Anion gap: 8 (ref 5–15)
BUN: 11 mg/dL (ref 6–20)
CO2: 26 mmol/L (ref 22–32)
Calcium: 8.9 mg/dL (ref 8.9–10.3)
Chloride: 99 mmol/L (ref 98–111)
Creatinine, Ser: 0.78 mg/dL (ref 0.61–1.24)
GFR, Estimated: >60 mL/min (ref >60)
Glucose, Bld: 79 mg/dL (ref 70–99)
Potassium: 4.5 mmol/L (ref 3.5–5.1)
Sodium: 133 mmol/L — ABNORMAL LOW (ref 135–145)
Total Bilirubin: 0.3 mg/dL (ref 0.3–1.2)
Total Protein: 7.2 g/dL (ref 6.5–8.1)

## 2020-10-05 MED ORDER — PALONOSETRON HCL INJECTION 0.25 MG/5ML
0.2500 mg | Freq: Once | INTRAVENOUS | Status: AC
  Administered 2020-10-05: 0.25 mg via INTRAVENOUS
  Filled 2020-10-05: qty 5

## 2020-10-05 MED ORDER — SODIUM CHLORIDE 0.9 % IV SOLN
300.0000 mg | Freq: Once | INTRAVENOUS | Status: AC
  Administered 2020-10-05: 300 mg via INTRAVENOUS
  Filled 2020-10-05: qty 30

## 2020-10-05 MED ORDER — SODIUM CHLORIDE 0.9 % IV SOLN
Freq: Once | INTRAVENOUS | Status: AC
  Filled 2020-10-05: qty 250

## 2020-10-05 MED ORDER — DIPHENHYDRAMINE HCL 50 MG/ML IJ SOLN
50.0000 mg | Freq: Once | INTRAMUSCULAR | Status: AC
  Administered 2020-10-05: 50 mg via INTRAVENOUS
  Filled 2020-10-05: qty 1

## 2020-10-05 MED ORDER — SODIUM CHLORIDE 0.9% FLUSH
10.0000 mL | INTRAVENOUS | Status: DC | PRN
  Filled 2020-10-05: qty 10

## 2020-10-05 MED ORDER — SODIUM CHLORIDE 0.9 % IV SOLN
20.0000 mg | Freq: Once | INTRAVENOUS | Status: AC
  Administered 2020-10-05: 20 mg via INTRAVENOUS
  Filled 2020-10-05: qty 20

## 2020-10-05 MED ORDER — HEPARIN SOD (PORK) LOCK FLUSH 100 UNIT/ML IV SOLN
500.0000 [IU] | Freq: Once | INTRAVENOUS | Status: DC
  Filled 2020-10-05: qty 5

## 2020-10-05 MED ORDER — SODIUM CHLORIDE 0.9 % IV SOLN: 300.0000 mg | Freq: Once | INTRAVENOUS | Status: DC

## 2020-10-05 MED ORDER — SODIUM CHLORIDE 0.9 % IV SOLN
45.0000 mg/m2 | Freq: Once | INTRAVENOUS | Status: AC
  Administered 2020-10-05: 96 mg via INTRAVENOUS
  Filled 2020-10-05: qty 16

## 2020-10-05 MED ORDER — FAMOTIDINE IN NACL 20-0.9 MG/50ML-% IV SOLN
20.0000 mg | Freq: Once | INTRAVENOUS | Status: AC
  Administered 2020-10-05: 20 mg via INTRAVENOUS
  Filled 2020-10-05: qty 50

## 2020-10-06 ENCOUNTER — Ambulatory Visit
Admission: RE | Admit: 2020-10-06 | Discharge: 2020-10-06 | Disposition: A | Discharge status: Home / Self Care | Payer: Medicaid Other | Source: Ambulatory Visit | Attending: Radiation Oncology | Admitting: Radiation Oncology

## 2020-10-06 DIAGNOSIS — C3412 Malignant neoplasm of upper lobe, left bronchus or lung: Secondary | ICD-10-CM | POA: Diagnosis not present

## 2020-10-06 DIAGNOSIS — C778 Secondary and unspecified malignant neoplasm of lymph nodes of multiple regions: Secondary | ICD-10-CM | POA: Diagnosis not present

## 2020-10-07 ENCOUNTER — Encounter: Payer: Self-pay | Admitting: Oncology

## 2020-10-07 ENCOUNTER — Ambulatory Visit
Admission: RE | Admit: 2020-10-07 | Discharge: 2020-10-07 | Disposition: A | Discharge status: Home / Self Care | Payer: Medicaid Other | Source: Ambulatory Visit | Attending: Radiation Oncology | Admitting: Radiation Oncology

## 2020-10-07 DIAGNOSIS — C778 Secondary and unspecified malignant neoplasm of lymph nodes of multiple regions: Secondary | ICD-10-CM | POA: Diagnosis not present

## 2020-10-07 DIAGNOSIS — C3412 Malignant neoplasm of upper lobe, left bronchus or lung: Secondary | ICD-10-CM | POA: Diagnosis not present

## 2020-10-07 NOTE — Progress Notes (Signed)
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 He had a chest x-ray followed by a CT scan which showed a 5.7 x 4.4 cm left upper lobe mass along with left hilar adenopathy. There was a low-density 3.6 lesion noted in the right hepatic lobe which ultimately turned out to be a hemangioma on MRI liver. PET CT scan showed hypermetabolic activity in the left upper lobe and left hilar nodal tissue. Subcarinal and right paratracheal lymph nodes with mild hypermetabolic features.  Patient underwent bronchoscopies and biopsies.Left upper lobe biopsy was consistent with non-small cell carcinoma favor squamous cell carcinoma right subcarinal lymph node biopsy was negative for malignancy.  Plan is for concurrent chemoradiation with carbotaxol followed by maintenance durvalumab. Patient does not want to get a port placed. He is also refused Covid vaccination  Interval history-patient is currently doing well with chemoradiation so far.  Denies any significant nausea or vomiting.  He does report some burning pain on swallowing and was prescribed Carafate by Dr. Baruch Gouty.  ECOG PS- 1 Pain scale- 2  Review of systems- Review of Systems  Constitutional: Negative for chills, fever, malaise/fatigue and weight loss.  HENT: Negative for congestion, ear discharge and nosebleeds.    Eyes: Negative for blurred vision.  Respiratory: Negative for cough, hemoptysis, sputum production, shortness of breath and wheezing.   Cardiovascular: Negative for chest pain, palpitations, orthopnea and claudication.  Gastrointestinal: Negative for abdominal pain, blood in stool, constipation, diarrhea, heartburn, melena, nausea and vomiting.       Burning pain on swallowing  Genitourinary: Negative for dysuria, flank pain, frequency, hematuria and urgency.  Musculoskeletal: Negative for back pain, joint pain and myalgias.  Skin: Negative for rash.  Neurological: Negative for dizziness, tingling, focal weakness, seizures, weakness and headaches.  Endo/Heme/Allergies: Does not bruise/bleed easily.  Psychiatric/Behavioral: Negative for depression and suicidal ideas. The patient does not have insomnia.      No Known Allergies   Past Medical History:  Diagnosis Date  . COPD (chronic obstructive pulmonary disease) ([place])   . Emphysema of lung ([place])   . Lung cancer ([place])   . Pneumonia      Past Surgical History:  Procedure Laterality Date  . BACK SURGERY  2017   lumbar region herniated disc  . VIDEO BRONCHOSCOPY WITH ENDOBRONCHIAL NAVIGATION N/A 08/07/2020   Procedure: VIDEO BRONCHOSCOPY WITH ENDOBRONCHIAL NAVIGATION;  Surgeon: Tyler Pita, MD;  Location: [facility] ORS;  Service: Pulmonary;  Laterality: N/A;  . VIDEO BRONCHOSCOPY WITH ENDOBRONCHIAL ULTRASOUND N/A 08/07/2020   Procedure: VIDEO BRONCHOSCOPY WITH ENDOBRONCHIAL ULTRASOUND;  Surgeon: Tyler Pita, MD;  Location: [facility] ORS;  Service: Pulmonary;  Laterality: N/A;    Social History   Socioeconomic History  . Marital status: Divorced    Spouse name: Not on file  . Number of children: Not on file  . Years of education: Not on file  . Highest  education level: Not on file  Occupational History  . Not on file  Tobacco Use  . Smoking status: Current Every Day Smoker    Packs/day: 2.00    Years: 35.00    Pack  years: 70.00    Types: Cigarettes  . Smokeless tobacco: Former Systems developer    Types: Chew  . Tobacco comment: was smoking 3 ppd but down to 2 pack per day--07/29/2020  Vaping Use  . Vaping Use: Never used  Substance and Sexual Activity  . Alcohol use: Yes    Alcohol/week: 8.0 standard drinks    Types: 8 Cans of beer per week  . Drug use: No  . Sexual activity: Not on file  Other Topics Concern  . Not on file  Social History Narrative  . Not on file   Social Determinants of Health   Financial Resource Strain:   . Difficulty of Paying Living Expenses: Not on file  Food Insecurity:   . Worried About Charity fundraiser in the Last Year: Not on file  . Ran Out of Food in the Last Year: Not on file  Transportation Needs:   . Lack of Transportation (Medical): Not on file  . Lack of Transportation (Non-Medical): Not on file  Physical Activity:   . Days of Exercise per Week: Not on file  . Minutes of Exercise per Session: Not on file  Stress:   . Feeling of Stress : Not on file  Social Connections:   . Frequency of Communication with Friends and Family: Not on file  . Frequency of Social Gatherings with Friends and Family: Not on file  . Attends Religious Services: Not on file  . Active Member of Clubs or Organizations: Not on file  . Attends Archivist Meetings: Not on file  . Marital Status: Not on file  Intimate Partner Violence:   . Fear of Current or Ex-Partner: Not on file  . Emotionally Abused: Not on file  . Physically Abused: Not on file  . Sexually Abused: Not on file    Family History  Problem Relation Age of Onset  . Diabetes Father   . Lung cancer Father 62  . Heart attack Father   . Throat cancer Maternal Aunt      Current Outpatient Medications:  .  acetaminophen (TYLENOL) 500 MG tablet, Take 500-1,000 mg by mouth every 6 (six) hours as needed (for pain.)., Disp: , Rfl:  .  dexamethasone (DECADRON) 4 MG tablet, Take 2 tablets (8 mg total) by mouth  daily. Start the day after chemotherapy for 2 days., Disp: 30 tablet, Rfl: 1 .  ondansetron (ZOFRAN) 8 MG tablet, Take 1 tablet (8 mg total) by mouth 2 (two) times daily as needed for refractory nausea / vomiting. Start on day 3 after chemo., Disp: 30 tablet, Rfl: 1 .  sucralfate (CARAFATE) 1 g tablet, Take 1 tablet (1 g total) by mouth 3 (three) times daily. Dissolve in 3-4 tbsp warm water, swish and swallow., Disp: 90 tablet, Rfl: 1 .  Tiotropium Bromide-Olodaterol (STIOLTO RESPIMAT) 2.5-2.5 MCG/ACT AERS, Inhale 2 puffs into the lungs daily., Disp: , Rfl:  .  prochlorperazine (COMPAZINE) 10 MG tablet, Take 1 tablet (10 mg total) by mouth every 6 (six) hours as needed (Nausea or vomiting). (Patient not taking: Reported on 09/14/2020), Disp: 30 tablet, Rfl: 1  Physical exam:  Vitals:   10/05/20 0952  BP: 113/74  Pulse: 74  Resp: 18  Temp: (!) 96.6 F (35.9 C)  TempSrc:  Tympanic  SpO2: 100%  Weight: 190 lb 11.2 oz (86.5 kg)   Physical Exam Constitutional:      General: He is not in acute distress. Cardiovascular:     Rate and Rhythm: Normal rate and regular rhythm.     Heart sounds: Normal heart sounds.  Pulmonary:     Effort: Pulmonary effort is normal.     Breath sounds: Normal breath sounds.  Abdominal:     General: Bowel sounds are normal.     Palpations: Abdomen is soft.  Skin:    General: Skin is warm and dry.  Neurological:     Mental Status: He is alert and oriented to person, place, and time.      CMP Latest Ref Rng & Units 10/05/2020  Glucose 70 - 99 mg/dL 79  BUN 6 - 20 mg/dL 11  Creatinine 0.61 - 1.24 mg/dL 0.78  Sodium 135 - 145 mmol/L 133(L)  Potassium 3.5 - 5.1 mmol/L 4.5  Chloride 98 - 111 mmol/L 99  CO2 22 - 32 mmol/L 26  Calcium 8.9 - 10.3 mg/dL 8.9  Total Protein 6.5 - 8.1 g/dL 7.2  Total Bilirubin 0.3 - 1.2 mg/dL 0.3  Alkaline Phos 38 - 126 U/L 67  AST 15 - 41 U/L 15  ALT 0 - 44 U/L 16   CBC Latest Ref Rng & Units 10/05/2020  WBC 4.0 - 10.5 K/uL  6.3  Hemoglobin 13.0 - 17.0 g/dL 13.9  Hematocrit 39 - 52 % 39.6  Platelets 150 - 400 K/uL 258      Assessment and plan- Patient is a 54 y.o. male withh/ostage III squamous cell carcinoma of the left upper lobe T3 N1 M0.  He is here for on treatment assessment prior to cycle 4 of weekly carbotaxol chemotherapy  Counts okay to proceed with cycle 4 of weekly carbotaxol chemotherapy.  He does have some mild radiation esophagitis for which she has been prescribed Carafate by Dr. Donella Stade.  Otherwise tolerating treatment well without any significant side effects.  He will directly proceed for cycle 5 next week and I will see him back in 2 weeks for cycle 6   Visit Diagnosis 1. Encounter for antineoplastic chemotherapy   2. Malignant neoplasm of unspecified part of unspecified bronchus or lung ([place])   3. Radiation esophagitis      Dr. Randa Evens, MD, MPH [facility] [phone] 10/07/2020 10:25 AM

## 2020-10-08 ENCOUNTER — Ambulatory Visit
Admission: RE | Admit: 2020-10-08 | Discharge: 2020-10-08 | Disposition: A | Discharge status: Home / Self Care | Payer: Medicaid Other | Source: Ambulatory Visit | Attending: Radiation Oncology | Admitting: Radiation Oncology

## 2020-10-08 DIAGNOSIS — C3412 Malignant neoplasm of upper lobe, left bronchus or lung: Secondary | ICD-10-CM | POA: Diagnosis not present

## 2020-10-08 DIAGNOSIS — C778 Secondary and unspecified malignant neoplasm of lymph nodes of multiple regions: Secondary | ICD-10-CM | POA: Diagnosis not present

## 2020-10-09 ENCOUNTER — Telehealth: Payer: Self-pay | Admitting: *Deleted

## 2020-10-09 ENCOUNTER — Other Ambulatory Visit: Payer: Self-pay | Admitting: Oncology

## 2020-10-09 ENCOUNTER — Ambulatory Visit
Admission: RE | Admit: 2020-10-09 | Discharge: 2020-10-09 | Disposition: A | Discharge status: Home / Self Care | Payer: Medicaid Other | Source: Ambulatory Visit | Attending: Radiation Oncology | Admitting: Radiation Oncology

## 2020-10-09 ENCOUNTER — Other Ambulatory Visit: Payer: Self-pay | Admitting: Nurse Practitioner

## 2020-10-09 DIAGNOSIS — C3412 Malignant neoplasm of upper lobe, left bronchus or lung: Secondary | ICD-10-CM | POA: Diagnosis not present

## 2020-10-09 DIAGNOSIS — C778 Secondary and unspecified malignant neoplasm of lymph nodes of multiple regions: Secondary | ICD-10-CM | POA: Diagnosis not present

## 2020-10-09 MED ORDER — OXYCODONE HCL 5 MG PO TABS: 5.0000 mg | ORAL_TABLET | ORAL | 0 refills | Status: DC | PRN

## 2020-10-09 MED ORDER — OXYCODONE HCL 5 MG PO TABS
5.0000 mg | ORAL_TABLET | ORAL | 0 refills | Status: DC | PRN
Start: 2020-10-09 — End: 2021-02-04

## 2020-10-09 NOTE — Telephone Encounter (Signed)
Dr Janese Banks said that it is from esophagitis from radiation. Pain med given but electronically the meds are not going through so His family member  samantha boggs came to pick up medication and jack was helping to pay the [facility] at [facility].

## 2020-10-09 NOTE — Telephone Encounter (Signed)
Patient called reporting that he is having pain in his chest all over and that it hurts to breathe. It started this morning and he said that he first thought it was heartburn, but is not going away. He is asking that we call his daughter [name] with what can be done for it. Please avise

## 2020-10-12 ENCOUNTER — Inpatient Hospital Stay: Payer: Medicaid Other

## 2020-10-12 ENCOUNTER — Other Ambulatory Visit: Payer: Self-pay

## 2020-10-12 ENCOUNTER — Ambulatory Visit
Admission: RE | Admit: 2020-10-12 | Discharge: 2020-10-12 | Disposition: A | Discharge status: Home / Self Care | Payer: Medicaid Other | Source: Ambulatory Visit | Attending: Radiation Oncology | Admitting: Radiation Oncology

## 2020-10-12 ENCOUNTER — Other Ambulatory Visit: Payer: Self-pay | Admitting: Oncology

## 2020-10-12 VITALS — BP 123/79 | HR 68 | Temp 96.4°F | Resp 18 | Wt 193.2 lb

## 2020-10-12 DIAGNOSIS — C3490 Malignant neoplasm of unspecified part of unspecified bronchus or lung: Secondary | ICD-10-CM

## 2020-10-12 DIAGNOSIS — C3412 Malignant neoplasm of upper lobe, left bronchus or lung: Secondary | ICD-10-CM | POA: Diagnosis not present

## 2020-10-12 DIAGNOSIS — C778 Secondary and unspecified malignant neoplasm of lymph nodes of multiple regions: Secondary | ICD-10-CM | POA: Diagnosis not present

## 2020-10-12 DIAGNOSIS — Z5111 Encounter for antineoplastic chemotherapy: Secondary | ICD-10-CM | POA: Diagnosis not present

## 2020-10-12 DIAGNOSIS — Z87891 Personal history of nicotine dependence: Secondary | ICD-10-CM | POA: Diagnosis not present

## 2020-10-12 LAB — CBC WITH DIFFERENTIAL/PLATELET
Abs Immature Granulocytes: 0.05 10*3/uL (ref 0.00–0.07)
Basophils Absolute: 0 10*3/uL (ref 0.0–0.1)
Basophils Relative: 1 %
Eosinophils Absolute: 0.1 10*3/uL (ref 0.0–0.5)
Eosinophils Relative: 1 %
HCT: 35.2 % — ABNORMAL LOW (ref 39.0–52.0)
Hemoglobin: 12.1 g/dL — ABNORMAL LOW (ref 13.0–17.0)
Immature Granulocytes: 1 %
Lymphocytes Relative: 11 %
Lymphs Abs: 0.6 10*3/uL — ABNORMAL LOW (ref 0.7–4.0)
MCH: 29.8 pg (ref 26.0–34.0)
MCHC: 34.4 g/dL (ref 30.0–36.0)
MCV: 86.7 fL (ref 80.0–100.0)
Monocytes Absolute: 0.4 10*3/uL (ref 0.1–1.0)
Monocytes Relative: 8 %
Neutro Abs: 3.9 10*3/uL (ref 1.7–7.7)
Neutrophils Relative %: 78 %
Platelets: 131 10*3/uL — ABNORMAL LOW (ref 150–400)
RBC: 4.06 MIL/uL — ABNORMAL LOW (ref 4.22–5.81)
RDW: 14.1 % (ref 11.5–15.5)
WBC: 4.9 10*3/uL (ref 4.0–10.5)
nRBC: 0 % (ref 0.0–0.2)

## 2020-10-12 LAB — COMPREHENSIVE METABOLIC PANEL
ALT: 16 U/L (ref 0–44)
AST: 15 U/L (ref 15–41)
Albumin: 3.5 g/dL (ref 3.5–5.0)
Alkaline Phosphatase: 53 U/L (ref 38–126)
Anion gap: 4 — ABNORMAL LOW (ref 5–15)
BUN: 10 mg/dL (ref 6–20)
CO2: 27 mmol/L (ref 22–32)
Calcium: 8.3 mg/dL — ABNORMAL LOW (ref 8.9–10.3)
Chloride: 101 mmol/L (ref 98–111)
Creatinine, Ser: 0.74 mg/dL (ref 0.61–1.24)
GFR, Estimated: >60 mL/min (ref >60)
Glucose, Bld: 94 mg/dL (ref 70–99)
Potassium: 4.1 mmol/L (ref 3.5–5.1)
Sodium: 132 mmol/L — ABNORMAL LOW (ref 135–145)
Total Bilirubin: 0.5 mg/dL (ref 0.3–1.2)
Total Protein: 6.9 g/dL (ref 6.5–8.1)

## 2020-10-12 MED ORDER — FAMOTIDINE IN NACL 20-0.9 MG/50ML-% IV SOLN
20.0000 mg | Freq: Once | INTRAVENOUS | Status: AC
  Administered 2020-10-12: 20 mg via INTRAVENOUS
  Filled 2020-10-12: qty 50

## 2020-10-12 MED ORDER — PALONOSETRON HCL INJECTION 0.25 MG/5ML
0.2500 mg | Freq: Once | INTRAVENOUS | Status: AC
  Administered 2020-10-12: 0.25 mg via INTRAVENOUS
  Filled 2020-10-12: qty 5

## 2020-10-12 MED ORDER — DIPHENHYDRAMINE HCL 50 MG/ML IJ SOLN
50.0000 mg | Freq: Once | INTRAMUSCULAR | Status: AC
  Administered 2020-10-12: 50 mg via INTRAVENOUS
  Filled 2020-10-12: qty 1

## 2020-10-12 MED ORDER — SODIUM CHLORIDE 0.9 % IV SOLN
Freq: Once | INTRAVENOUS | Status: AC
  Filled 2020-10-12: qty 250

## 2020-10-12 MED ORDER — SODIUM CHLORIDE 0.9 % IV SOLN
20.0000 mg | Freq: Once | INTRAVENOUS | Status: AC
  Administered 2020-10-12: 20 mg via INTRAVENOUS
  Filled 2020-10-12: qty 20

## 2020-10-12 MED ORDER — SODIUM CHLORIDE 0.9 % IV SOLN
300.0000 mg | Freq: Once | INTRAVENOUS | Status: AC
  Administered 2020-10-12: 300 mg via INTRAVENOUS
  Filled 2020-10-12: qty 30

## 2020-10-12 MED ORDER — SODIUM CHLORIDE 0.9 % IV SOLN
45.0000 mg/m2 | Freq: Once | INTRAVENOUS | Status: AC
  Administered 2020-10-12: 96 mg via INTRAVENOUS
  Filled 2020-10-12: qty 16

## 2020-10-12 NOTE — Progress Notes (Signed)
Pt reports tingling in bilateral feet that started over the last "few weeks". Pt reports that feet are not numb and that it does not interfere with walking or ADLs. Dr. Janese Banks aware and no new orders at this time, pt to receive Taxol and Carboplatin at this time.

## 2020-10-13 ENCOUNTER — Ambulatory Visit
Admission: RE | Admit: 2020-10-13 | Discharge: 2020-10-13 | Disposition: A | Discharge status: Home / Self Care | Payer: Medicaid Other | Source: Ambulatory Visit | Attending: Radiation Oncology | Admitting: Radiation Oncology

## 2020-10-13 DIAGNOSIS — C778 Secondary and unspecified malignant neoplasm of lymph nodes of multiple regions: Secondary | ICD-10-CM | POA: Diagnosis not present

## 2020-10-13 DIAGNOSIS — C3412 Malignant neoplasm of upper lobe, left bronchus or lung: Secondary | ICD-10-CM | POA: Diagnosis not present

## 2020-10-14 ENCOUNTER — Ambulatory Visit
Admission: RE | Admit: 2020-10-14 | Discharge: 2020-10-14 | Disposition: A | Discharge status: Home / Self Care | Payer: Medicaid Other | Source: Ambulatory Visit | Attending: Radiation Oncology | Admitting: Radiation Oncology

## 2020-10-14 DIAGNOSIS — C778 Secondary and unspecified malignant neoplasm of lymph nodes of multiple regions: Secondary | ICD-10-CM | POA: Diagnosis not present

## 2020-10-14 DIAGNOSIS — C3412 Malignant neoplasm of upper lobe, left bronchus or lung: Secondary | ICD-10-CM | POA: Diagnosis not present

## 2020-10-15 ENCOUNTER — Ambulatory Visit
Admission: RE | Admit: 2020-10-15 | Discharge: 2020-10-15 | Disposition: A | Discharge status: Home / Self Care | Payer: Medicaid Other | Source: Ambulatory Visit | Attending: Radiation Oncology | Admitting: Radiation Oncology

## 2020-10-15 DIAGNOSIS — C3412 Malignant neoplasm of upper lobe, left bronchus or lung: Secondary | ICD-10-CM | POA: Diagnosis not present

## 2020-10-15 DIAGNOSIS — C778 Secondary and unspecified malignant neoplasm of lymph nodes of multiple regions: Secondary | ICD-10-CM | POA: Diagnosis not present

## 2020-10-16 ENCOUNTER — Ambulatory Visit
Admission: RE | Admit: 2020-10-16 | Discharge: 2020-10-16 | Disposition: A | Discharge status: Home / Self Care | Payer: Medicaid Other | Source: Ambulatory Visit | Attending: Radiation Oncology | Admitting: Radiation Oncology

## 2020-10-16 DIAGNOSIS — C778 Secondary and unspecified malignant neoplasm of lymph nodes of multiple regions: Secondary | ICD-10-CM | POA: Diagnosis not present

## 2020-10-16 DIAGNOSIS — C3412 Malignant neoplasm of upper lobe, left bronchus or lung: Secondary | ICD-10-CM | POA: Diagnosis not present

## 2020-10-19 ENCOUNTER — Inpatient Hospital Stay: Payer: Medicaid Other

## 2020-10-19 ENCOUNTER — Inpatient Hospital Stay (HOSPITAL_BASED_OUTPATIENT_CLINIC_OR_DEPARTMENT_OTHER): Payer: Medicaid Other | Admitting: Oncology

## 2020-10-19 ENCOUNTER — Other Ambulatory Visit: Payer: Self-pay | Admitting: *Deleted

## 2020-10-19 ENCOUNTER — Ambulatory Visit
Admission: RE | Admit: 2020-10-19 | Discharge: 2020-10-19 | Disposition: A | Discharge status: Home / Self Care | Payer: Medicaid Other | Source: Ambulatory Visit | Attending: Radiation Oncology | Admitting: Radiation Oncology

## 2020-10-19 ENCOUNTER — Other Ambulatory Visit: Payer: Self-pay

## 2020-10-19 ENCOUNTER — Encounter: Payer: Self-pay | Admitting: Oncology

## 2020-10-19 VITALS — BP 122/76 | HR 71 | Resp 18

## 2020-10-19 VITALS — BP 129/76 | HR 73 | Temp 97.9°F | Resp 20 | Wt 192.4 lb

## 2020-10-19 DIAGNOSIS — E871 Hypo-osmolality and hyponatremia: Secondary | ICD-10-CM

## 2020-10-19 DIAGNOSIS — C3412 Malignant neoplasm of upper lobe, left bronchus or lung: Secondary | ICD-10-CM | POA: Diagnosis not present

## 2020-10-19 DIAGNOSIS — C3490 Malignant neoplasm of unspecified part of unspecified bronchus or lung: Secondary | ICD-10-CM

## 2020-10-19 DIAGNOSIS — Z5111 Encounter for antineoplastic chemotherapy: Secondary | ICD-10-CM | POA: Diagnosis not present

## 2020-10-19 DIAGNOSIS — Z87891 Personal history of nicotine dependence: Secondary | ICD-10-CM | POA: Diagnosis not present

## 2020-10-19 DIAGNOSIS — C778 Secondary and unspecified malignant neoplasm of lymph nodes of multiple regions: Secondary | ICD-10-CM | POA: Diagnosis not present

## 2020-10-19 LAB — CBC WITH DIFFERENTIAL/PLATELET
Abs Immature Granulocytes: 0.05 10*3/uL (ref 0.00–0.07)
Basophils Absolute: 0 10*3/uL (ref 0.0–0.1)
Basophils Relative: 1 %
Eosinophils Absolute: 0.1 10*3/uL (ref 0.0–0.5)
Eosinophils Relative: 1 %
HCT: 35.4 % — ABNORMAL LOW (ref 39.0–52.0)
Hemoglobin: 12.3 g/dL — ABNORMAL LOW (ref 13.0–17.0)
Immature Granulocytes: 1 %
Lymphocytes Relative: 14 %
Lymphs Abs: 0.6 10*3/uL — ABNORMAL LOW (ref 0.7–4.0)
MCH: 30 pg (ref 26.0–34.0)
MCHC: 34.7 g/dL (ref 30.0–36.0)
MCV: 86.3 fL (ref 80.0–100.0)
Monocytes Absolute: 0.3 10*3/uL (ref 0.1–1.0)
Monocytes Relative: 7 %
Neutro Abs: 3.2 10*3/uL (ref 1.7–7.7)
Neutrophils Relative %: 76 %
Platelets: 144 10*3/uL — ABNORMAL LOW (ref 150–400)
RBC: 4.1 MIL/uL — ABNORMAL LOW (ref 4.22–5.81)
RDW: 14.4 % (ref 11.5–15.5)
WBC: 4.2 10*3/uL (ref 4.0–10.5)
nRBC: 0 % (ref 0.0–0.2)

## 2020-10-19 LAB — COMPREHENSIVE METABOLIC PANEL
ALT: 17 U/L (ref 0–44)
AST: 14 U/L — ABNORMAL LOW (ref 15–41)
Albumin: 3.8 g/dL (ref 3.5–5.0)
Alkaline Phosphatase: 61 U/L (ref 38–126)
Anion gap: 7 (ref 5–15)
BUN: 10 mg/dL (ref 6–20)
CO2: 25 mmol/L (ref 22–32)
Calcium: 8.3 mg/dL — ABNORMAL LOW (ref 8.9–10.3)
Chloride: 97 mmol/L — ABNORMAL LOW (ref 98–111)
Creatinine, Ser: 0.7 mg/dL (ref 0.61–1.24)
GFR, Estimated: >60 mL/min (ref >60)
Glucose, Bld: 84 mg/dL (ref 70–99)
Potassium: 4.1 mmol/L (ref 3.5–5.1)
Sodium: 129 mmol/L — ABNORMAL LOW (ref 135–145)
Total Bilirubin: 0.6 mg/dL (ref 0.3–1.2)
Total Protein: 6.8 g/dL (ref 6.5–8.1)

## 2020-10-19 MED ORDER — PALONOSETRON HCL INJECTION 0.25 MG/5ML
0.2500 mg | Freq: Once | INTRAVENOUS | Status: AC
  Administered 2020-10-19: 0.25 mg via INTRAVENOUS
  Filled 2020-10-19: qty 5

## 2020-10-19 MED ORDER — SODIUM CHLORIDE 0.9 % IV SOLN
45.0000 mg/m2 | Freq: Once | INTRAVENOUS | Status: AC
  Administered 2020-10-19: 96 mg via INTRAVENOUS
  Filled 2020-10-19: qty 16

## 2020-10-19 MED ORDER — FAMOTIDINE IN NACL 20-0.9 MG/50ML-% IV SOLN
20.0000 mg | Freq: Once | INTRAVENOUS | Status: AC
  Administered 2020-10-19: 20 mg via INTRAVENOUS
  Filled 2020-10-19: qty 50

## 2020-10-19 MED ORDER — SODIUM CHLORIDE 0.9 % IV SOLN
20.0000 mg | Freq: Once | INTRAVENOUS | Status: AC
  Administered 2020-10-19: 20 mg via INTRAVENOUS
  Filled 2020-10-19: qty 20

## 2020-10-19 MED ORDER — DIPHENHYDRAMINE HCL 50 MG/ML IJ SOLN
50.0000 mg | Freq: Once | INTRAMUSCULAR | Status: AC
  Administered 2020-10-19: 50 mg via INTRAVENOUS
  Filled 2020-10-19: qty 1

## 2020-10-19 MED ORDER — SODIUM CHLORIDE 0.9 % IV SOLN
Freq: Once | INTRAVENOUS | Status: AC
  Filled 2020-10-19: qty 250

## 2020-10-19 MED ORDER — SODIUM CHLORIDE 0.9 % IV SOLN
300.0000 mg | Freq: Once | INTRAVENOUS | Status: AC
  Administered 2020-10-19: 300 mg via INTRAVENOUS
  Filled 2020-10-19: qty 30

## 2020-10-19 NOTE — Progress Notes (Signed)
img 

## 2020-10-20 ENCOUNTER — Ambulatory Visit
Admission: RE | Admit: 2020-10-20 | Discharge: 2020-10-20 | Disposition: A | Discharge status: Home / Self Care | Payer: Medicaid Other | Source: Ambulatory Visit | Attending: Radiation Oncology | Admitting: Radiation Oncology

## 2020-10-20 DIAGNOSIS — C778 Secondary and unspecified malignant neoplasm of lymph nodes of multiple regions: Secondary | ICD-10-CM | POA: Diagnosis not present

## 2020-10-20 DIAGNOSIS — C3412 Malignant neoplasm of upper lobe, left bronchus or lung: Secondary | ICD-10-CM | POA: Diagnosis not present

## 2020-10-20 NOTE — Progress Notes (Signed)
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 He had a chest x-ray followed by a CT scan which showed a 5.7 x 4.4 cm left upper lobe mass along with left hilar adenopathy. There was a low-density 3.6 lesion noted in the right hepatic lobe which ultimately turned out to be a hemangioma on MRI liver. PET CT scan showed hypermetabolic activity in the left upper lobe and left hilar nodal tissue. Subcarinal and right paratracheal lymph nodes with mild hypermetabolic features.  Patient underwent bronchoscopies and biopsies.Left upper lobe biopsy was consistent with non-small cell carcinoma favor squamous cell carcinoma right subcarinal lymph node biopsy was negative for malignancy.  Plan is for concurrent chemoradiation with carbotaxol followed by maintenance durvalumab. Patient does not want to get a port placed. 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  ECOG PS- 1 Pain scale- 2 Opioid associated constipation- no  Review of systems- Review of Systems  Constitutional: Negative for chills, fever, malaise/fatigue and  weight loss.  HENT: Negative for congestion, ear discharge and nosebleeds.   Eyes: Negative for blurred vision.  Respiratory: Negative for cough, hemoptysis, sputum production, shortness of breath and wheezing.   Cardiovascular: Negative for chest pain, palpitations, orthopnea and claudication.  Gastrointestinal: Negative for abdominal pain, blood in stool, constipation, diarrhea, heartburn, melena, nausea and vomiting.       Retrosternal pain/pain during swallowing  Genitourinary: Negative for dysuria, flank pain, frequency, hematuria and urgency.  Musculoskeletal: Negative for back pain, joint pain and myalgias.  Skin: Negative for rash.  Neurological: Negative for dizziness, tingling, focal weakness, seizures, weakness and headaches.  Endo/Heme/Allergies: Does not bruise/bleed easily.  Psychiatric/Behavioral: Negative for depression and suicidal ideas. The patient does not have insomnia.      No Known Allergies   Past Medical History:  Diagnosis Date  . COPD (chronic obstructive pulmonary disease) ([place])   . Emphysema of lung ([place])   . Lung cancer ([place])   . Pneumonia      Past Surgical History:  Procedure Laterality Date  . BACK SURGERY  2017   lumbar region herniated disc  . VIDEO BRONCHOSCOPY WITH ENDOBRONCHIAL NAVIGATION N/A 08/07/2020   Procedure: VIDEO BRONCHOSCOPY WITH ENDOBRONCHIAL NAVIGATION;  Surgeon: Tyler Pita, MD;  Location: [facility] ORS;  Service: Pulmonary;  Laterality: N/A;  . VIDEO BRONCHOSCOPY WITH ENDOBRONCHIAL ULTRASOUND N/A 08/07/2020   Procedure: VIDEO BRONCHOSCOPY WITH ENDOBRONCHIAL ULTRASOUND;  Surgeon: Tyler Pita, MD;  Location: [facility] ORS;  Service: Pulmonary;  Laterality: N/A;    Social History   Socioeconomic History  . Marital status: Divorced    Spouse name: Not on file  . Number of children: Not on file  .  Years of education: Not on file  . Highest education level: Not on file  Occupational History  . Not on file  Tobacco Use  .  Smoking status: Current Every Day Smoker    Packs/day: 2.00    Years: 35.00    Pack years: 70.00    Types: Cigarettes  . Smokeless tobacco: Former Systems developer    Types: Chew  . Tobacco comment: was smoking 3 ppd but down to 2 pack per day--07/29/2020  Vaping Use  . Vaping Use: Never used  Substance and Sexual Activity  . Alcohol use: Yes    Alcohol/week: 8.0 standard drinks    Types: 8 Cans of beer per week  . Drug use: No  . Sexual activity: Not on file  Other Topics Concern  . Not on file  Social History Narrative  . Not on file   Social Determinants of Health   Financial Resource Strain:   . Difficulty of Paying Living Expenses: Not on file  Food Insecurity:   . Worried About Charity fundraiser in the Last Year: Not on file  . Ran Out of Food in the Last Year: Not on file  Transportation Needs:   . Lack of Transportation (Medical): Not on file  . Lack of Transportation (Non-Medical): Not on file  Physical Activity:   . Days of Exercise per Week: Not on file  . Minutes of Exercise per Session: Not on file  Stress:   . Feeling of Stress : Not on file  Social Connections:   . Frequency of Communication with Friends and Family: Not on file  . Frequency of Social Gatherings with Friends and Family: Not on file  . Attends Religious Services: Not on file  . Active Member of Clubs or Organizations: Not on file  . Attends Archivist Meetings: Not on file  . Marital Status: Not on file  Intimate Partner Violence:   . Fear of Current or Ex-Partner: Not on file  . Emotionally Abused: Not on file  . Physically Abused: Not on file  . Sexually Abused: Not on file    Family History  Problem Relation Age of Onset  . Diabetes Father   . Lung cancer Father 34  . Heart attack Father   . Throat cancer Maternal Aunt      Current Outpatient Medications:  .  acetaminophen (TYLENOL) 500 MG tablet, Take 500-1,000 mg by mouth every 6 (six) hours as needed (for pain.)., Disp: ,  Rfl:  .  dexamethasone (DECADRON) 4 MG tablet, Take 2 tablets (8 mg total) by mouth daily. Start the day after chemotherapy for 2 days., Disp: 30 tablet, Rfl: 1 .  ondansetron (ZOFRAN) 8 MG tablet, Take 1 tablet (8 mg total) by mouth 2 (two) times daily as needed for refractory nausea / vomiting. Start on day 3 after chemo., Disp: 30 tablet, Rfl: 1 .  oxyCODONE (OXY IR/ROXICODONE) 5 MG immediate release tablet, Take 1 tablet (5 mg total) by mouth every 4 (four) hours as needed for severe pain., Disp: 120 tablet, Rfl: 0 .  prochlorperazine (COMPAZINE) 10 MG tablet, Take 1 tablet (10 mg total) by mouth every 6 (six) hours as needed (Nausea or vomiting)., Disp: 30 tablet, Rfl: 1 .  sucralfate (CARAFATE) 1 g tablet, Take 1 tablet (1 g total) by mouth 3 (three) times daily. Dissolve in 3-4 tbsp warm water, swish and swallow., Disp: 90 tablet, Rfl: 1 .  Tiotropium Bromide-Olodaterol (STIOLTO RESPIMAT) 2.5-2.5 MCG/ACT AERS, Inhale 2 puffs  into the lungs daily., Disp: , Rfl:   Physical exam:  Vitals:   10/19/20 0944  BP: 129/76  Pulse: 73  Resp: 20  Temp: 97.9 F (36.6 C)  SpO2: 100%  Weight: 192 lb 6.4 oz (87.3 kg)   Physical Exam Constitutional:      General: He is not in acute distress. Cardiovascular:     Rate and Rhythm: Normal rate and regular rhythm.     Heart sounds: Normal heart sounds.  Pulmonary:     Effort: Pulmonary effort is normal.     Breath sounds: Normal breath sounds.  Abdominal:     General: Bowel sounds are normal.     Palpations: Abdomen is soft.  Skin:    General: Skin is warm and dry.  Neurological:     Mental Status: He is alert and oriented to person, place, and time.      CMP Latest Ref Rng & Units 10/19/2020  Glucose 70 - 99 mg/dL 84  BUN 6 - 20 mg/dL 10  Creatinine 0.61 - 1.24 mg/dL 0.70  Sodium 135 - 145 mmol/L 129(L)  Potassium 3.5 - 5.1 mmol/L 4.1  Chloride 98 - 111 mmol/L 97(L)  CO2 22 - 32 mmol/L 25  Calcium 8.9 - 10.3 mg/dL 8.3(L)  Total  Protein 6.5 - 8.1 g/dL 6.8  Total Bilirubin 0.3 - 1.2 mg/dL 0.6  Alkaline Phos 38 - 126 U/L 61  AST 15 - 41 U/L 14(L)  ALT 0 - 44 U/L 17   CBC Latest Ref Rng & Units 10/19/2020  WBC 4.0 - 10.5 K/uL 4.2  Hemoglobin 13.0 - 17.0 g/dL 12.3(L)  Hematocrit 39 - 52 % 35.4(L)  Platelets 150 - 400 K/uL 144(L)      Assessment and plan- Patient is a 54 y.o. male withh/ostage III squamous cell carcinoma of the left upper lobe T3 N1 M0.    He is here for on treatment assessment prior to cycle 6 of weekly carbotaxol chemotherapy  Counts okay to proceed with cycle 6 of weekly carbotaxol chemotherapy today.  He will proceed with cycle 7 next week which would be his last cycle.He also completes radiation treatment next week.  I will plan to repeat CT chest abdomen and pelvis with contrast sometime during mid November and see him right after Thanksgiving with labs on 1120 01/04/2020 for first cycle of maintenance durvalumab depending on the results of CT scan  Hyponatremia: He will receive 1 L of IV fluids today   Visit Diagnosis 1. Encounter for antineoplastic chemotherapy   2. Malignant neoplasm of unspecified part of unspecified bronchus or lung ([place])   3. Hyponatremia      Dr. Randa Evens, MD, MPH [facility] [phone] 10/20/2020 1:18 PM

## 2020-10-21 ENCOUNTER — Ambulatory Visit
Admission: RE | Admit: 2020-10-21 | Discharge: 2020-10-21 | Disposition: A | Discharge status: Home / Self Care | Payer: Medicaid Other | Source: Ambulatory Visit | Attending: Radiation Oncology | Admitting: Radiation Oncology

## 2020-10-21 DIAGNOSIS — C3412 Malignant neoplasm of upper lobe, left bronchus or lung: Secondary | ICD-10-CM | POA: Diagnosis not present

## 2020-10-21 DIAGNOSIS — C778 Secondary and unspecified malignant neoplasm of lymph nodes of multiple regions: Secondary | ICD-10-CM | POA: Diagnosis not present

## 2020-10-22 ENCOUNTER — Ambulatory Visit
Admission: RE | Admit: 2020-10-22 | Discharge: 2020-10-22 | Disposition: A | Discharge status: Home / Self Care | Payer: Medicaid Other | Source: Ambulatory Visit | Attending: Radiation Oncology | Admitting: Radiation Oncology

## 2020-10-22 DIAGNOSIS — C778 Secondary and unspecified malignant neoplasm of lymph nodes of multiple regions: Secondary | ICD-10-CM | POA: Diagnosis not present

## 2020-10-22 DIAGNOSIS — C3412 Malignant neoplasm of upper lobe, left bronchus or lung: Secondary | ICD-10-CM | POA: Diagnosis not present

## 2020-10-23 ENCOUNTER — Ambulatory Visit
Admission: RE | Admit: 2020-10-23 | Discharge: 2020-10-23 | Disposition: A | Discharge status: Home / Self Care | Payer: Medicaid Other | Source: Ambulatory Visit | Attending: Radiation Oncology | Admitting: Radiation Oncology

## 2020-10-23 DIAGNOSIS — C3412 Malignant neoplasm of upper lobe, left bronchus or lung: Secondary | ICD-10-CM | POA: Diagnosis not present

## 2020-10-23 DIAGNOSIS — C778 Secondary and unspecified malignant neoplasm of lymph nodes of multiple regions: Secondary | ICD-10-CM | POA: Diagnosis not present

## 2020-10-26 ENCOUNTER — Inpatient Hospital Stay: Payer: Medicaid Other | Attending: Oncology

## 2020-10-26 ENCOUNTER — Inpatient Hospital Stay (HOSPITAL_BASED_OUTPATIENT_CLINIC_OR_DEPARTMENT_OTHER): Payer: Medicaid Other | Admitting: Oncology

## 2020-10-26 ENCOUNTER — Ambulatory Visit
Admission: RE | Admit: 2020-10-26 | Discharge: 2020-10-26 | Disposition: A | Discharge status: Home / Self Care | Payer: Medicaid Other | Source: Ambulatory Visit | Attending: Radiation Oncology | Admitting: Radiation Oncology

## 2020-10-26 ENCOUNTER — Inpatient Hospital Stay: Payer: Medicaid Other

## 2020-10-26 ENCOUNTER — Encounter: Payer: Self-pay | Admitting: Oncology

## 2020-10-26 ENCOUNTER — Other Ambulatory Visit: Payer: Self-pay

## 2020-10-26 VITALS — BP 118/76 | HR 79 | Temp 98.0°F | Resp 20 | Wt 192.6 lb

## 2020-10-26 DIAGNOSIS — J449 Chronic obstructive pulmonary disease, unspecified: Secondary | ICD-10-CM | POA: Diagnosis not present

## 2020-10-26 DIAGNOSIS — C778 Secondary and unspecified malignant neoplasm of lymph nodes of multiple regions: Secondary | ICD-10-CM | POA: Insufficient documentation

## 2020-10-26 DIAGNOSIS — D1803 Hemangioma of intra-abdominal structures: Secondary | ICD-10-CM | POA: Insufficient documentation

## 2020-10-26 DIAGNOSIS — C3412 Malignant neoplasm of upper lobe, left bronchus or lung: Secondary | ICD-10-CM | POA: Insufficient documentation

## 2020-10-26 DIAGNOSIS — Z801 Family history of malignant neoplasm of trachea, bronchus and lung: Secondary | ICD-10-CM | POA: Diagnosis not present

## 2020-10-26 DIAGNOSIS — F17210 Nicotine dependence, cigarettes, uncomplicated: Secondary | ICD-10-CM | POA: Insufficient documentation

## 2020-10-26 DIAGNOSIS — Z8249 Family history of ischemic heart disease and other diseases of the circulatory system: Secondary | ICD-10-CM | POA: Insufficient documentation

## 2020-10-26 DIAGNOSIS — Z5111 Encounter for antineoplastic chemotherapy: Secondary | ICD-10-CM | POA: Insufficient documentation

## 2020-10-26 DIAGNOSIS — Z833 Family history of diabetes mellitus: Secondary | ICD-10-CM | POA: Diagnosis not present

## 2020-10-26 DIAGNOSIS — C3490 Malignant neoplasm of unspecified part of unspecified bronchus or lung: Secondary | ICD-10-CM

## 2020-10-26 DIAGNOSIS — R12 Heartburn: Secondary | ICD-10-CM | POA: Diagnosis not present

## 2020-10-26 DIAGNOSIS — Z79899 Other long term (current) drug therapy: Secondary | ICD-10-CM | POA: Diagnosis not present

## 2020-10-26 DIAGNOSIS — Z87891 Personal history of nicotine dependence: Secondary | ICD-10-CM | POA: Diagnosis not present

## 2020-10-26 LAB — COMPREHENSIVE METABOLIC PANEL
ALT: 17 U/L (ref 0–44)
AST: 13 U/L — ABNORMAL LOW (ref 15–41)
Albumin: 3.7 g/dL (ref 3.5–5.0)
Alkaline Phosphatase: 59 U/L (ref 38–126)
Anion gap: 5 (ref 5–15)
BUN: 11 mg/dL (ref 6–20)
CO2: 27 mmol/L (ref 22–32)
Calcium: 8.9 mg/dL (ref 8.9–10.3)
Chloride: 102 mmol/L (ref 98–111)
Creatinine, Ser: 0.72 mg/dL (ref 0.61–1.24)
GFR, Estimated: >60 mL/min (ref >60)
Glucose, Bld: 58 mg/dL — ABNORMAL LOW (ref 70–99)
Potassium: 4.4 mmol/L (ref 3.5–5.1)
Sodium: 134 mmol/L — ABNORMAL LOW (ref 135–145)
Total Bilirubin: 0.5 mg/dL (ref 0.3–1.2)
Total Protein: 7 g/dL (ref 6.5–8.1)

## 2020-10-26 LAB — CBC WITH DIFFERENTIAL/PLATELET
Abs Immature Granulocytes: 0.05 10*3/uL (ref 0.00–0.07)
Basophils Absolute: 0 10*3/uL (ref 0.0–0.1)
Basophils Relative: 1 %
Eosinophils Absolute: 0 10*3/uL (ref 0.0–0.5)
Eosinophils Relative: 1 %
HCT: 35.1 % — ABNORMAL LOW (ref 39.0–52.0)
Hemoglobin: 12 g/dL — ABNORMAL LOW (ref 13.0–17.0)
Immature Granulocytes: 2 %
Lymphocytes Relative: 19 %
Lymphs Abs: 0.5 10*3/uL — ABNORMAL LOW (ref 0.7–4.0)
MCH: 29.9 pg (ref 26.0–34.0)
MCHC: 34.2 g/dL (ref 30.0–36.0)
MCV: 87.3 fL (ref 80.0–100.0)
Monocytes Absolute: 0.3 10*3/uL (ref 0.1–1.0)
Monocytes Relative: 12 %
Neutro Abs: 1.6 10*3/uL — ABNORMAL LOW (ref 1.7–7.7)
Neutrophils Relative %: 65 %
Platelets: 228 10*3/uL (ref 150–400)
RBC: 4.02 MIL/uL — ABNORMAL LOW (ref 4.22–5.81)
RDW: 14.7 % (ref 11.5–15.5)
WBC: 2.5 10*3/uL — ABNORMAL LOW (ref 4.0–10.5)
nRBC: 0 % (ref 0.0–0.2)

## 2020-10-26 MED ORDER — SODIUM CHLORIDE 0.9 % IV SOLN
300.0000 mg | Freq: Once | INTRAVENOUS | Status: AC
  Administered 2020-10-26: 300 mg via INTRAVENOUS
  Filled 2020-10-26: qty 30

## 2020-10-26 MED ORDER — DIPHENHYDRAMINE HCL 50 MG/ML IJ SOLN
50.0000 mg | Freq: Once | INTRAMUSCULAR | Status: AC
  Administered 2020-10-26: 50 mg via INTRAVENOUS
  Filled 2020-10-26: qty 1

## 2020-10-26 MED ORDER — SODIUM CHLORIDE 0.9 % IV SOLN
45.0000 mg/m2 | Freq: Once | INTRAVENOUS | Status: AC
  Administered 2020-10-26: 96 mg via INTRAVENOUS
  Filled 2020-10-26: qty 16

## 2020-10-26 MED ORDER — SODIUM CHLORIDE 0.9 % IV SOLN
20.0000 mg | Freq: Once | INTRAVENOUS | Status: AC
  Administered 2020-10-26: 20 mg via INTRAVENOUS
  Filled 2020-10-26: qty 20

## 2020-10-26 MED ORDER — FAMOTIDINE IN NACL 20-0.9 MG/50ML-% IV SOLN
20.0000 mg | Freq: Once | INTRAVENOUS | Status: AC
  Administered 2020-10-26: 20 mg via INTRAVENOUS
  Filled 2020-10-26: qty 50

## 2020-10-26 MED ORDER — SODIUM CHLORIDE 0.9 % IV SOLN
Freq: Once | INTRAVENOUS | Status: AC
  Filled 2020-10-26: qty 250

## 2020-10-26 MED ORDER — PALONOSETRON HCL INJECTION 0.25 MG/5ML
0.2500 mg | Freq: Once | INTRAVENOUS | Status: AC
  Administered 2020-10-26: 0.25 mg via INTRAVENOUS
  Filled 2020-10-26: qty 5

## 2020-10-27 ENCOUNTER — Ambulatory Visit
Admission: RE | Admit: 2020-10-27 | Discharge: 2020-10-27 | Disposition: A | Discharge status: Home / Self Care | Payer: Medicaid Other | Source: Ambulatory Visit | Attending: Radiation Oncology | Admitting: Radiation Oncology

## 2020-10-27 DIAGNOSIS — C3412 Malignant neoplasm of upper lobe, left bronchus or lung: Secondary | ICD-10-CM | POA: Diagnosis not present

## 2020-10-27 DIAGNOSIS — C778 Secondary and unspecified malignant neoplasm of lymph nodes of multiple regions: Secondary | ICD-10-CM | POA: Diagnosis not present

## 2020-10-27 NOTE — Progress Notes (Signed)
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 He had a chest x-ray followed by a CT scan which showed a 5.7 x 4.4 cm left upper lobe mass along with left hilar adenopathy. There was a low-density 3.6 lesion noted in the right hepatic lobe which ultimately turned out to be a hemangioma on MRI liver. PET CT scan showed hypermetabolic activity in the left upper lobe and left hilar nodal tissue. Subcarinal and right paratracheal lymph nodes with mild hypermetabolic features.  Patient underwent bronchoscopies and biopsies.Left upper lobe biopsy was consistent with non-small cell carcinoma favor squamous cell carcinoma right subcarinal lymph node biopsy was negative for malignancy.  Plan is for concurrent chemoradiation with carbotaxol followed by maintenance durvalumab. Patient does not want to get a port placed. He is also refused Covid vaccination  NGS testing showed high tumor mutational burden.  PD-L1 90%.  MSI stable.  Patient PIK3CA and notch2 FCHO2 fusion, T p53   Interval history-patient is on Carafate for his heartburn symptoms which are currently stable.  He remains active and independent of ADLs and IADLs.  Appetite and weight have remained stable.  ECOG PS- 1 Pain scale- 0   Review of  systems- Review of Systems  Constitutional: Negative for chills, fever, malaise/fatigue and weight loss.  HENT: Negative for congestion, ear discharge and nosebleeds.   Eyes: Negative for blurred vision.  Respiratory: Negative for cough, hemoptysis, sputum production, shortness of breath and wheezing.   Cardiovascular: Negative for chest pain, palpitations, orthopnea and claudication.  Gastrointestinal: Positive for heartburn. Negative for abdominal pain, blood in stool, constipation, diarrhea, melena, nausea and vomiting.  Genitourinary: Negative for dysuria, flank pain, frequency, hematuria and urgency.  Musculoskeletal: Negative for back pain, joint pain and myalgias.  Skin: Negative for rash.  Neurological: Negative for dizziness, tingling, focal weakness, seizures, weakness and headaches.  Endo/Heme/Allergies: Does not bruise/bleed easily.  Psychiatric/Behavioral: Negative for depression and suicidal ideas. The patient does not have insomnia.       No Known Allergies   Past Medical History:  Diagnosis Date  . COPD (chronic obstructive pulmonary disease) ([place])   . Emphysema of lung ([place])   . Lung cancer ([place])   . Pneumonia      Past Surgical History:  Procedure Laterality Date  . BACK SURGERY  2017   lumbar region herniated disc  . VIDEO BRONCHOSCOPY WITH ENDOBRONCHIAL NAVIGATION N/A 08/07/2020   Procedure: VIDEO BRONCHOSCOPY WITH ENDOBRONCHIAL NAVIGATION;  Surgeon: Tyler Pita, MD;  Location: [facility] ORS;  Service: Pulmonary;  Laterality: N/A;  . VIDEO BRONCHOSCOPY WITH ENDOBRONCHIAL ULTRASOUND N/A 08/07/2020   Procedure: VIDEO BRONCHOSCOPY WITH ENDOBRONCHIAL ULTRASOUND;  Surgeon: Tyler Pita, MD;  Location: [facility] ORS;  Service: Pulmonary;  Laterality: N/A;    Social History   Socioeconomic History  . Marital status: Divorced    Spouse  name: Not on file  . Number of children: Not on file  . Years of education: Not on file  . Highest education level: Not on  file  Occupational History  . Not on file  Tobacco Use  . Smoking status: Current Every Day Smoker    Packs/day: 2.00    Years: 35.00    Pack years: 70.00    Types: Cigarettes  . Smokeless tobacco: Former Systems developer    Types: Chew  . Tobacco comment: was smoking 3 ppd but down to 2 pack per day--07/29/2020  Vaping Use  . Vaping Use: Never used  Substance and Sexual Activity  . Alcohol use: Yes    Alcohol/week: 8.0 standard drinks    Types: 8 Cans of beer per week  . Drug use: No  . Sexual activity: Not on file  Other Topics Concern  . Not on file  Social History Narrative  . Not on file   Social Determinants of Health   Financial Resource Strain:   . Difficulty of Paying Living Expenses: Not on file  Food Insecurity:   . Worried About Charity fundraiser in the Last Year: Not on file  . Ran Out of Food in the Last Year: Not on file  Transportation Needs:   . Lack of Transportation (Medical): Not on file  . Lack of Transportation (Non-Medical): Not on file  Physical Activity:   . Days of Exercise per Week: Not on file  . Minutes of Exercise per Session: Not on file  Stress:   . Feeling of Stress : Not on file  Social Connections:   . Frequency of Communication with Friends and Family: Not on file  . Frequency of Social Gatherings with Friends and Family: Not on file  . Attends Religious Services: Not on file  . Active Member of Clubs or Organizations: Not on file  . Attends Archivist Meetings: Not on file  . Marital Status: Not on file  Intimate Partner Violence:   . Fear of Current or Ex-Partner: Not on file  . Emotionally Abused: Not on file  . Physically Abused: Not on file  . Sexually Abused: Not on file    Family History  Problem Relation Age of Onset  . Diabetes Father   . Lung cancer Father 37  . Heart attack Father   . Throat cancer Maternal Aunt      Current Outpatient Medications:  .  acetaminophen (TYLENOL) 500 MG tablet, Take 500-1,000 mg  by mouth every 6 (six) hours as needed (for pain.)., Disp: , Rfl:  .  dexamethasone (DECADRON) 4 MG tablet, Take 2 tablets (8 mg total) by mouth daily. Start the day after chemotherapy for 2 days., Disp: 30 tablet, Rfl: 1 .  ondansetron (ZOFRAN) 8 MG tablet, Take 1 tablet (8 mg total) by mouth 2 (two) times daily as needed for refractory nausea / vomiting. Start on day 3 after chemo., Disp: 30 tablet, Rfl: 1 .  oxyCODONE (OXY IR/ROXICODONE) 5 MG immediate release tablet, Take 1 tablet (5 mg total) by mouth every 4 (four) hours as needed for severe pain., Disp: 120 tablet, Rfl: 0 .  prochlorperazine (COMPAZINE) 10 MG tablet, Take 1 tablet (10 mg total) by mouth every 6 (six) hours as needed (Nausea or vomiting)., Disp: 30 tablet, Rfl: 1 .  sucralfate (CARAFATE) 1 g tablet, Take 1 tablet (1 g total) by mouth 3 (three) times daily. Dissolve in 3-4 tbsp warm water, swish and swallow., Disp: 90 tablet,  Rfl: 1 .  Tiotropium Bromide-Olodaterol (STIOLTO RESPIMAT) 2.5-2.5 MCG/ACT AERS, Inhale 2 puffs into the lungs daily., Disp: , Rfl:   Physical exam:  Vitals:   10/26/20 0903  BP: 118/76  Pulse: 79  Resp: 20  Temp: 98 F (36.7 C)  SpO2: 100%  Weight: 192 lb 9.6 oz (87.4 kg)   Physical Exam Constitutional:      General: He is not in acute distress. Cardiovascular:     Rate and Rhythm: Normal rate and regular rhythm.     Heart sounds: Normal heart sounds.  Pulmonary:     Effort: Pulmonary effort is normal.     Breath sounds: Normal breath sounds.  Abdominal:     General: Bowel sounds are normal.     Palpations: Abdomen is soft.  Skin:    General: Skin is warm and dry.  Neurological:     Mental Status: He is alert and oriented to person, place, and time.      CMP Latest Ref Rng & Units 10/26/2020  Glucose 70 - 99 mg/dL 58(L)  BUN 6 - 20 mg/dL 11  Creatinine 0.61 - 1.24 mg/dL 0.72  Sodium 135 - 145 mmol/L 134(L)  Potassium 3.5 - 5.1 mmol/L 4.4  Chloride 98 - 111 mmol/L 102  CO2 22 -  32 mmol/L 27  Calcium 8.9 - 10.3 mg/dL 8.9  Total Protein 6.5 - 8.1 g/dL 7.0  Total Bilirubin 0.3 - 1.2 mg/dL 0.5  Alkaline Phos 38 - 126 U/L 59  AST 15 - 41 U/L 13(L)  ALT 0 - 44 U/L 17   CBC Latest Ref Rng & Units 10/26/2020  WBC 4.0 - 10.5 K/uL 2.5(L)  Hemoglobin 13.0 - 17.0 g/dL 12.0(L)  Hematocrit 39 - 52 % 35.1(L)  Platelets 150 - 400 K/uL 228     Assessment and plan- Patient is a 54 y.o. male withh/ostage III squamous cell carcinoma of the left upper lobe T3 N1 M0.   He is here for on treatment assessment prior to cycle 7 of weekly carbotaxol chemotherapy  Counts okay to proceed with cycle 7 of weekly carbotaxol chemotherapy which would be his last cycle.  I will plan to get repeat CT chest abdomen pelvis with contrast in 2 weeks time and see him after Thanksgiving to start first cycle of maintenance durvalumab.  NGS testing did show high tumor mutational burden and PD-L1 of 90% indicating good likelihood of response to immunotherapy.   Visit Diagnosis 1. Encounter for antineoplastic chemotherapy   2. Malignant neoplasm of unspecified part of unspecified bronchus or lung ([place])      Dr. Randa Evens, MD, MPH [facility] [phone] 10/27/2020 1:55 PM

## 2020-10-28 ENCOUNTER — Ambulatory Visit
Admission: RE | Admit: 2020-10-28 | Discharge: 2020-10-28 | Disposition: A | Discharge status: Home / Self Care | Payer: Medicaid Other | Source: Ambulatory Visit | Attending: Radiation Oncology | Admitting: Radiation Oncology

## 2020-10-28 DIAGNOSIS — C778 Secondary and unspecified malignant neoplasm of lymph nodes of multiple regions: Secondary | ICD-10-CM | POA: Diagnosis not present

## 2020-10-28 DIAGNOSIS — C3412 Malignant neoplasm of upper lobe, left bronchus or lung: Secondary | ICD-10-CM | POA: Diagnosis not present

## 2020-10-29 ENCOUNTER — Ambulatory Visit
Admission: RE | Admit: 2020-10-29 | Discharge: 2020-10-29 | Disposition: A | Discharge status: Home / Self Care | Payer: Medicaid Other | Source: Ambulatory Visit | Attending: Radiation Oncology | Admitting: Radiation Oncology

## 2020-10-29 DIAGNOSIS — C3412 Malignant neoplasm of upper lobe, left bronchus or lung: Secondary | ICD-10-CM | POA: Diagnosis not present

## 2020-10-29 DIAGNOSIS — C778 Secondary and unspecified malignant neoplasm of lymph nodes of multiple regions: Secondary | ICD-10-CM | POA: Diagnosis not present

## 2020-10-30 ENCOUNTER — Ambulatory Visit
Admission: RE | Admit: 2020-10-30 | Discharge: 2020-10-30 | Disposition: A | Discharge status: Home / Self Care | Payer: Medicaid Other | Source: Ambulatory Visit | Attending: Radiation Oncology | Admitting: Radiation Oncology

## 2020-10-30 DIAGNOSIS — C778 Secondary and unspecified malignant neoplasm of lymph nodes of multiple regions: Secondary | ICD-10-CM | POA: Diagnosis not present

## 2020-10-30 DIAGNOSIS — C3412 Malignant neoplasm of upper lobe, left bronchus or lung: Secondary | ICD-10-CM | POA: Diagnosis not present

## 2020-11-02 ENCOUNTER — Ambulatory Visit
Admission: RE | Admit: 2020-11-02 | Discharge: 2020-11-02 | Disposition: A | Discharge status: Home / Self Care | Payer: Medicaid Other | Source: Ambulatory Visit | Attending: Radiation Oncology | Admitting: Radiation Oncology

## 2020-11-02 DIAGNOSIS — Z87891 Personal history of nicotine dependence: Secondary | ICD-10-CM | POA: Diagnosis not present

## 2020-11-02 DIAGNOSIS — C778 Secondary and unspecified malignant neoplasm of lymph nodes of multiple regions: Secondary | ICD-10-CM | POA: Diagnosis not present

## 2020-11-02 DIAGNOSIS — C3412 Malignant neoplasm of upper lobe, left bronchus or lung: Secondary | ICD-10-CM | POA: Diagnosis not present

## 2020-11-09 ENCOUNTER — Other Ambulatory Visit: Payer: Self-pay

## 2020-11-09 ENCOUNTER — Ambulatory Visit
Admission: RE | Admit: 2020-11-09 | Discharge: 2020-11-09 | Disposition: A | Discharge status: Home / Self Care | Payer: Medicaid Other | Source: Ambulatory Visit | Attending: Oncology | Admitting: Oncology

## 2020-11-09 DIAGNOSIS — C3412 Malignant neoplasm of upper lobe, left bronchus or lung: Secondary | ICD-10-CM

## 2020-11-09 DIAGNOSIS — J432 Centrilobular emphysema: Secondary | ICD-10-CM | POA: Diagnosis not present

## 2020-11-09 DIAGNOSIS — I700 Atherosclerosis of aorta: Secondary | ICD-10-CM | POA: Diagnosis not present

## 2020-11-09 DIAGNOSIS — D1803 Hemangioma of intra-abdominal structures: Secondary | ICD-10-CM | POA: Diagnosis not present

## 2020-11-09 DIAGNOSIS — I2510 Atherosclerotic heart disease of native coronary artery without angina pectoris: Secondary | ICD-10-CM | POA: Diagnosis not present

## 2020-11-09 DIAGNOSIS — R059 Cough, unspecified: Secondary | ICD-10-CM | POA: Diagnosis not present

## 2020-11-09 MED ORDER — IOHEXOL 300 MG/ML  SOLN
100.0000 mL | Freq: Once | INTRAMUSCULAR | Status: AC | PRN
  Administered 2020-11-09: 100 mL via INTRAVENOUS

## 2020-11-16 ENCOUNTER — Other Ambulatory Visit: Payer: Self-pay | Admitting: Oncology

## 2020-11-16 DIAGNOSIS — C3490 Malignant neoplasm of unspecified part of unspecified bronchus or lung: Secondary | ICD-10-CM

## 2020-11-16 NOTE — Progress Notes (Signed)
DISCONTINUE ON PATHWAY REGIMEN - Non-Small Cell Lung     Administer weekly:     Paclitaxel      Carboplatin   **Always confirm dose/schedule in your pharmacy ordering system**  REASON: Continuation Of Treatment PRIOR TREATMENT: [id number]: Carboplatin AUC=2 + Paclitaxel 45 mg/m2 Weekly During Radiation TREATMENT RESPONSE: Partial Response (PR)  START ON PATHWAY REGIMEN - Non-Small Cell Lung     A cycle is every 14 days:     Durvalumab   **Always confirm dose/schedule in your pharmacy ordering system**  Patient Characteristics: Preoperative or Nonsurgical Candidate (Clinical Staging), Stage III - Nonsurgical Candidate (Nonsquamous and Squamous), PS = 0, 1 Therapeutic Status: Preoperative or Nonsurgical Candidate (Clinical Staging) AJCC T Category: cT3 AJCC N Category: cN1 AJCC M Category: cM0 AJCC 8 Stage Grouping: IIIA ECOG Performance Status: 0 Intent of Therapy: Curative Intent, Discussed with Patient

## 2020-11-18 NOTE — Progress Notes (Signed)
..  The following Medication: Imfinzi has been approved thru [employer] as Assistance Program. Enrollment period is 11/17/2020 to 11/16/2021.  Assistance ID: [id number]. Reason for Assistance: Self Pay First DOS: 11/30/2020

## 2020-11-23 ENCOUNTER — Inpatient Hospital Stay: Payer: Medicaid Other

## 2020-11-23 ENCOUNTER — Inpatient Hospital Stay: Payer: Medicaid Other | Admitting: Oncology

## 2020-11-23 NOTE — Progress Notes (Signed)
Pharmacist Chemotherapy Monitoring - Initial Assessment    Anticipated start date: 11/30/20  Regimen:  . Are orders appropriate based on the patient's diagnosis, regimen, and cycle? Yes . Does the plan date match the patient's scheduled date? Yes . Is the sequencing of drugs appropriate? Yes . Are the premedications appropriate for the patient's regimen? Yes . Prior Authorization for treatment is: Approved o If applicable, is the correct biosimilar selected based on the patient's insurance? not applicable  Organ Function and Labs: [name] Are dose adjustments needed based on the patient's renal function, hepatic function, or hematologic function? No . Are appropriate labs ordered prior to the start of patient's treatment? Yes . Other organ system assessment, if indicated: N/A . The following baseline labs, if indicated, have been ordered: durvalumab: baseline TSH +/- T4  Dose Assessment: . Are the drug doses appropriate? Yes . Are the following correct: o Drug concentrations Yes o IV fluid compatible with drug Yes o Administration routes Yes o Timing of therapy Yes . If applicable, does the patient have documented access for treatment and/or plans for port-a-cath placement? yes . If applicable, have lifetime cumulative doses been properly documented and assessed? no Lifetime Dose Tracking  . Carboplatin: 2,070 mg = 0.01 % of the maximum lifetime dose of 999,999,999 mg  o   Toxicity Monitoring/Prevention: . The patient has the following take home antiemetics prescribed: N/A . The patient has the following take home medications prescribed: N/A . Medication allergies and previous infusion related reactions, if applicable, have been reviewed and addressed. Yes . The patient's current medication list has been assessed for drug-drug interactions with their chemotherapy regimen. no significant drug-drug interactions were identified on review.  Order Review: . Are the treatment plan orders  signed? No . Is the patient scheduled to see a provider prior to their treatment? Yes  I verify that I have reviewed each item in the above checklist and answered each question accordingly.  Adelina Mings 11/23/2020 1:09 PM

## 2020-11-29 ENCOUNTER — Other Ambulatory Visit: Payer: Self-pay | Admitting: *Deleted

## 2020-11-29 DIAGNOSIS — C3412 Malignant neoplasm of upper lobe, left bronchus or lung: Secondary | ICD-10-CM

## 2020-11-30 ENCOUNTER — Other Ambulatory Visit: Payer: Self-pay

## 2020-11-30 ENCOUNTER — Inpatient Hospital Stay: Payer: Medicaid Other | Attending: Oncology | Admitting: Oncology

## 2020-11-30 ENCOUNTER — Inpatient Hospital Stay: Payer: Medicaid Other

## 2020-11-30 ENCOUNTER — Encounter: Payer: Self-pay | Admitting: Oncology

## 2020-11-30 ENCOUNTER — Telehealth: Payer: Self-pay | Admitting: Pulmonary Disease

## 2020-11-30 VITALS — BP 125/80 | HR 73 | Temp 96.8°F | Resp 18 | Wt 192.1 lb

## 2020-11-30 DIAGNOSIS — F17210 Nicotine dependence, cigarettes, uncomplicated: Secondary | ICD-10-CM | POA: Insufficient documentation

## 2020-11-30 DIAGNOSIS — D1803 Hemangioma of intra-abdominal structures: Secondary | ICD-10-CM | POA: Diagnosis not present

## 2020-11-30 DIAGNOSIS — I2510 Atherosclerotic heart disease of native coronary artery without angina pectoris: Secondary | ICD-10-CM | POA: Insufficient documentation

## 2020-11-30 DIAGNOSIS — Z5112 Encounter for antineoplastic immunotherapy: Secondary | ICD-10-CM | POA: Insufficient documentation

## 2020-11-30 DIAGNOSIS — Z833 Family history of diabetes mellitus: Secondary | ICD-10-CM | POA: Diagnosis not present

## 2020-11-30 DIAGNOSIS — I700 Atherosclerosis of aorta: Secondary | ICD-10-CM | POA: Insufficient documentation

## 2020-11-30 DIAGNOSIS — C3412 Malignant neoplasm of upper lobe, left bronchus or lung: Secondary | ICD-10-CM

## 2020-11-30 DIAGNOSIS — C3490 Malignant neoplasm of unspecified part of unspecified bronchus or lung: Secondary | ICD-10-CM

## 2020-11-30 DIAGNOSIS — Z801 Family history of malignant neoplasm of trachea, bronchus and lung: Secondary | ICD-10-CM | POA: Insufficient documentation

## 2020-11-30 DIAGNOSIS — Z7289 Other problems related to lifestyle: Secondary | ICD-10-CM | POA: Insufficient documentation

## 2020-11-30 DIAGNOSIS — Z8249 Family history of ischemic heart disease and other diseases of the circulatory system: Secondary | ICD-10-CM | POA: Insufficient documentation

## 2020-11-30 DIAGNOSIS — R590 Localized enlarged lymph nodes: Secondary | ICD-10-CM | POA: Diagnosis not present

## 2020-11-30 DIAGNOSIS — R0602 Shortness of breath: Secondary | ICD-10-CM | POA: Insufficient documentation

## 2020-11-30 DIAGNOSIS — Z79899 Other long term (current) drug therapy: Secondary | ICD-10-CM | POA: Diagnosis not present

## 2020-11-30 DIAGNOSIS — J449 Chronic obstructive pulmonary disease, unspecified: Secondary | ICD-10-CM | POA: Diagnosis not present

## 2020-11-30 LAB — CBC WITH DIFFERENTIAL/PLATELET
Abs Immature Granulocytes: 0.03 10*3/uL (ref 0.00–0.07)
Basophils Absolute: 0 10*3/uL (ref 0.0–0.1)
Basophils Relative: 1 %
Eosinophils Absolute: 0.6 10*3/uL — ABNORMAL HIGH (ref 0.0–0.5)
Eosinophils Relative: 10 %
HCT: 36.6 % — ABNORMAL LOW (ref 39.0–52.0)
Hemoglobin: 12.4 g/dL — ABNORMAL LOW (ref 13.0–17.0)
Immature Granulocytes: 1 %
Lymphocytes Relative: 13 %
Lymphs Abs: 0.8 10*3/uL (ref 0.7–4.0)
MCH: 31.8 pg (ref 26.0–34.0)
MCHC: 33.9 g/dL (ref 30.0–36.0)
MCV: 93.8 fL (ref 80.0–100.0)
Monocytes Absolute: 0.8 10*3/uL (ref 0.1–1.0)
Monocytes Relative: 13 %
Neutro Abs: 3.8 10*3/uL (ref 1.7–7.7)
Neutrophils Relative %: 62 %
Platelets: 269 10*3/uL (ref 150–400)
RBC: 3.9 MIL/uL — ABNORMAL LOW (ref 4.22–5.81)
RDW: 17.1 % — ABNORMAL HIGH (ref 11.5–15.5)
WBC: 6 10*3/uL (ref 4.0–10.5)
nRBC: 0 % (ref 0.0–0.2)

## 2020-11-30 LAB — COMPREHENSIVE METABOLIC PANEL
ALT: 14 U/L (ref 0–44)
AST: 20 U/L (ref 15–41)
Albumin: 3.8 g/dL (ref 3.5–5.0)
Alkaline Phosphatase: 58 U/L (ref 38–126)
Anion gap: 9 (ref 5–15)
BUN: 5 mg/dL — ABNORMAL LOW (ref 6–20)
CO2: 27 mmol/L (ref 22–32)
Calcium: 9.1 mg/dL (ref 8.9–10.3)
Chloride: 98 mmol/L (ref 98–111)
Creatinine, Ser: 0.79 mg/dL (ref 0.61–1.24)
GFR, Estimated: >60 mL/min (ref >60)
Glucose, Bld: 118 mg/dL — ABNORMAL HIGH (ref 70–99)
Potassium: 4.3 mmol/L (ref 3.5–5.1)
Sodium: 134 mmol/L — ABNORMAL LOW (ref 135–145)
Total Bilirubin: 0.5 mg/dL (ref 0.3–1.2)
Total Protein: 6.9 g/dL (ref 6.5–8.1)

## 2020-11-30 LAB — TSH: TSH: 0.842 u[IU]/mL (ref 0.350–4.500)

## 2020-11-30 MED ORDER — SODIUM CHLORIDE 0.9 % IV SOLN
Freq: Once | INTRAVENOUS | Status: AC
  Filled 2020-11-30: qty 250

## 2020-11-30 MED ORDER — SODIUM CHLORIDE 0.9 % IV SOLN
10.0000 mg/kg | Freq: Once | INTRAVENOUS | Status: AC
  Administered 2020-11-30: 860 mg via INTRAVENOUS
  Filled 2020-11-30: qty 10

## 2020-11-30 NOTE — Telephone Encounter (Signed)
Per Dr. Patsey Berthold-- schedule rov to discuss sob.   Patient has been scheduled 12/07/2020 at 3:00. Patient's daughter, Samantha(DPR) is aware and voiced her understanding.  Nothing further needed.

## 2020-11-30 NOTE — Progress Notes (Signed)
Pt tolerated infusion well, no s/s of distress or reaction noted. Pt stable at discharge.

## 2020-11-30 NOTE — Progress Notes (Signed)
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 He had a chest x-ray followed by a CT scan which showed a 5.7 x 4.4 cm left upper lobe mass along with left hilar adenopathy. There was a low-density 3.6 lesion noted in the right hepatic lobe which ultimately turned out to be a hemangioma on MRI liver. PET CT scan showed hypermetabolic activity in the left upper lobe and left hilar nodal tissue. Subcarinal and right paratracheal lymph nodes with mild hypermetabolic features.  Patient underwent bronchoscopies and biopsies.Left upper lobe biopsy was consistent with non-small cell carcinoma favor squamous cell carcinoma right subcarinal lymph node biopsy was negative for malignancy.  Plan is for concurrent chemoradiation with carbotaxol followed by maintenance durvalumab. Patient does not want to get a port placed. He is also refused Covid vaccination  NGS testing showed high tumor mutational burden.  PD-L1 90%.  MSI stable.  Patient PIK3CA and notch2 FCHO2 fusion, T p53    Interval history-reports shortness of breath on moderate exertion.  States that his present inhaler has not been helping him much  ECOG PS- 1 Pain scale- 0   Review of systems- Review of Systems  Constitutional: Negative for chills,  fever, malaise/fatigue and weight loss.  HENT: Negative for congestion, ear discharge and nosebleeds.   Eyes: Negative for blurred vision.  Respiratory: Positive for shortness of breath. Negative for cough, hemoptysis, sputum production and wheezing.   Cardiovascular: Negative for chest pain, palpitations, orthopnea and claudication.  Gastrointestinal: Negative for abdominal pain, blood in stool, constipation, diarrhea, heartburn, melena, nausea and vomiting.  Genitourinary: Negative for dysuria, flank pain, frequency, hematuria and urgency.  Musculoskeletal: Negative for back pain, joint pain and myalgias.  Skin: Negative for rash.  Neurological: Negative for dizziness, tingling, focal weakness, seizures, weakness and headaches.  Endo/Heme/Allergies: Does not bruise/bleed easily.  Psychiatric/Behavioral: Negative for depression and suicidal ideas. The patient does not have insomnia.       No Known Allergies   Past Medical History:  Diagnosis Date  . COPD (chronic obstructive pulmonary disease) ([place])   . Emphysema of lung ([place])   . Lung cancer ([place]) 06/2020  . Pneumonia      Past Surgical History:  Procedure Laterality Date  . BACK SURGERY  2017   lumbar region herniated disc  . VIDEO BRONCHOSCOPY WITH ENDOBRONCHIAL NAVIGATION N/A 08/07/2020   Procedure: VIDEO BRONCHOSCOPY WITH ENDOBRONCHIAL NAVIGATION;  Surgeon: Tyler Pita, MD;  Location: [facility] ORS;  Service: Pulmonary;  Laterality: N/A;  . VIDEO BRONCHOSCOPY WITH ENDOBRONCHIAL ULTRASOUND N/A 08/07/2020   Procedure: VIDEO BRONCHOSCOPY WITH ENDOBRONCHIAL ULTRASOUND;  Surgeon: Tyler Pita, MD;  Location: [facility] ORS;  Service: Pulmonary;  Laterality: N/A;    Social History   Socioeconomic History  . Marital status: Divorced    Spouse name: Not on file  . Number of children: Not  on file  . Years of education: Not on file  . Highest education level: Not on file  Occupational History  . Not on file  Tobacco Use  .  Smoking status: Current Every Day Smoker    Packs/day: 2.00    Years: 35.00    Pack years: 70.00    Types: Cigarettes  . Smokeless tobacco: Former Systems developer    Types: Chew  . Tobacco comment: was smoking 3 ppd but down to 2 pack per day--07/29/2020  Vaping Use  . Vaping Use: Never used  Substance and Sexual Activity  . Alcohol use: Yes    Alcohol/week: 8.0 standard drinks    Types: 8 Cans of beer per week  . Drug use: No  . Sexual activity: Not on file  Other Topics Concern  . Not on file  Social History Narrative  . Not on file   Social Determinants of Health   Financial Resource Strain:   . Difficulty of Paying Living Expenses: Not on file  Food Insecurity:   . Worried About Charity fundraiser in the Last Year: Not on file  . Ran Out of Food in the Last Year: Not on file  Transportation Needs:   . Lack of Transportation (Medical): Not on file  . Lack of Transportation (Non-Medical): Not on file  Physical Activity:   . Days of Exercise per Week: Not on file  . Minutes of Exercise per Session: Not on file  Stress:   . Feeling of Stress : Not on file  Social Connections:   . Frequency of Communication with Friends and Family: Not on file  . Frequency of Social Gatherings with Friends and Family: Not on file  . Attends Religious Services: Not on file  . Active Member of Clubs or Organizations: Not on file  . Attends Archivist Meetings: Not on file  . Marital Status: Not on file  Intimate Partner Violence:   . Fear of Current or Ex-Partner: Not on file  . Emotionally Abused: Not on file  . Physically Abused: Not on file  . Sexually Abused: Not on file    Family History  Problem Relation Age of Onset  . Diabetes Father   . Lung cancer Father 23  . Heart attack Father   . Throat cancer Maternal Aunt      Current Outpatient Medications:  .  acetaminophen (TYLENOL) 500 MG tablet, Take 500-1,000 mg by mouth every 6 (six) hours as needed (for pain.)., Disp: ,  Rfl:  .  oxyCODONE (OXY IR/ROXICODONE) 5 MG immediate release tablet, Take 1 tablet (5 mg total) by mouth every 4 (four) hours as needed for severe pain., Disp: 120 tablet, Rfl: 0 .  sucralfate (CARAFATE) 1 g tablet, Take 1 tablet (1 g total) by mouth 3 (three) times daily. Dissolve in 3-4 tbsp warm water, swish and swallow., Disp: 90 tablet, Rfl: 1 .  Tiotropium Bromide-Olodaterol (STIOLTO RESPIMAT) 2.5-2.5 MCG/ACT AERS, Inhale 2 puffs into the lungs daily., Disp: , Rfl:   Physical exam:  Vitals:   11/30/20 0911  BP: 125/80  Pulse: 73  Resp: 18  Temp: (!) 96.8 F (36 C)  TempSrc: Tympanic  SpO2: 97%  Weight: 192 lb 1.6 oz (87.1 kg)   Physical Exam Constitutional:      General: He is not in acute distress. Eyes:     Pupils: Pupils are equal, round, and reactive to light.  Cardiovascular:     Rate and Rhythm: Normal rate and regular  rhythm.     Heart sounds: Normal heart sounds.  Pulmonary:     Effort: Pulmonary effort is normal.     Breath sounds: Normal breath sounds.  Abdominal:     General: Bowel sounds are normal.     Palpations: Abdomen is soft.  Skin:    General: Skin is warm and dry.  Neurological:     Mental Status: He is alert and oriented to person, place, and time.      CMP Latest Ref Rng & Units 11/30/2020  Glucose 70 - 99 mg/dL 118(H)  BUN 6 - 20 mg/dL 5(L)  Creatinine 0.61 - 1.24 mg/dL 0.79  Sodium 135 - 145 mmol/L 134(L)  Potassium 3.5 - 5.1 mmol/L 4.3  Chloride 98 - 111 mmol/L 98  CO2 22 - 32 mmol/L 27  Calcium 8.9 - 10.3 mg/dL 9.1  Total Protein 6.5 - 8.1 g/dL 6.9  Total Bilirubin 0.3 - 1.2 mg/dL 0.5  Alkaline Phos 38 - 126 U/L 58  AST 15 - 41 U/L 20  ALT 0 - 44 U/L 14   CBC Latest Ref Rng & Units 11/30/2020  WBC 4.0 - 10.5 K/uL 6.0  Hemoglobin 13.0 - 17.0 g/dL 12.4(L)  Hematocrit 39 - 52 % 36.6(L)  Platelets 150 - 400 K/uL 269    No images are attached to the encounter.  CT CHEST ABDOMEN PELVIS W CONTRAST  Result Date:  11/09/2020 CLINICAL DATA:  Squamous cell carcinoma of the left upper lobe, on radiation therapy and chemotherapy. Diagnosed in July. Productive cough for 6 months. Smoker. EXAM: CT CHEST, ABDOMEN, AND PELVIS WITH CONTRAST TECHNIQUE: Multidetector CT imaging of the chest, abdomen and pelvis was performed following the standard protocol during bolus administration of intravenous contrast. CONTRAST:  119m OMNIPAQUE IOHEXOL 300 MG/ML  SOLN COMPARISON:  08/11/2020 PET. 07/20/2020 chest CT. Abdominal MRI 07/20/2020. FINDINGS: CT CHEST FINDINGS Cardiovascular: Aortic atherosclerosis. Lad coronary artery calcification. Normal heart size, without pericardial effusion. No central pulmonary embolism, on this non-dedicated study. Mediastinum/Nodes: No supraclavicular adenopathy. No residual or recurrent mediastinal adenopathy. An index subcarinal node measures 4 mm on 32/2 versus 12 mm on the prior diagnostic CT. Left hilar adenopathy has resolved. Lungs/Pleura: No pleural fluid.  Moderate centrilobular emphysema. Posterior left upper lobe cavitary lung mass measures 3.5 x 4.3 cm on 52/3 versus 4.4 x 5.7 cm on 07/20/2020. Significantly decreased wall thickness, on the order of 3 mm on 55/3 versus 14 mm on the prior diagnostic CT (when remeasured). The more caudal left upper lobe satellite nodule on the prior PET is no longer identified. Musculoskeletal: No acute osseous abnormality. CT ABDOMEN PELVIS FINDINGS Hepatobiliary: Multiple hepatic hemangiomas. Normal gallbladder, without biliary ductal dilatation. Pancreas: Normal, without mass or ductal dilatation. Spleen: Normal in size, without focal abnormality. Adrenals/Urinary Tract: Normal adrenal glands. Normal kidneys, without hydronephrosis. Normal urinary bladder. Stomach/Bowel: Normal stomach, without wall thickening. Normal colon, appendix, and terminal ileum. Normal small bowel. Vascular/Lymphatic: Aortic atherosclerosis. No abdominopelvic adenopathy. Reproductive:  Normal prostate. Other: No significant free fluid. No evidence of omental or peritoneal disease. Musculoskeletal: Presumed sebaceous cyst about the right paramidline anterior abdominal wall at 1.7 cm on 56/2. No acute osseous abnormality. Mild S-shaped thoracolumbar spine curvature. IMPRESSION: 1. Interval response to therapy of left upper lobe lung mass and thoracic adenopathy. 2. No new or progressive disease. 3. Aortic atherosclerosis (ICD10-I70.0), coronary artery atherosclerosis and emphysema (ICD10-J43.9). 4. Hepatic hemangiomas, as before. Electronically Signed   By: KAbigail MiyamotoM.D.   On: 11/09/2020 13:18  Assessment and plan- Patient is a 54 y.o. male withh/ostage III squamous cell carcinoma of the left upper lobe T3 N1 M0.  Her on treatment assessment prior to cycle 1 of maintenance durvalumab  I have reviewed CT chest abdomen pelvis images independently and discussed findings with the patient.  There has been interval reduction in the size of the left lower lobe mass.  Left hilar adenopathy has resolved.He has had partial response to treatment and would benefit from maintenance durvalumab at this time.  Counts are otherwise okay to proceed with cycle 1 of maintenance durvalumab today and I will see him back in 2 weeks for cycle 2.  Plan is to complete maintenance treatment for 1 year.  Patient reports having exertional shortness of breath which I suspect is secondary to his underlying COPD.He is currently on Stiolto inhaler and I will have him see Dr. Patsey Berthold as well to optimize his respiratory meds.  If shortness of breath persists despite that I will pursue cardiac work-up.  Patient and his daughter verbalized understanding of the plan   Visit Diagnosis 1. Encounter for antineoplastic immunotherapy   2. Malignant neoplasm of unspecified part of unspecified bronchus or lung ([place])      Dr. Randa Evens, MD, MPH [facility] [phone] 11/30/2020 1:17  PM

## 2020-12-01 LAB — T4: T4, Total: 5.8 ug/dL (ref 4.5–12.0)

## 2020-12-04 ENCOUNTER — Other Ambulatory Visit: Payer: Self-pay

## 2020-12-04 ENCOUNTER — Ambulatory Visit
Admission: RE | Admit: 2020-12-04 | Discharge: 2020-12-04 | Disposition: A | Discharge status: Home / Self Care | Payer: Medicaid Other | Source: Ambulatory Visit | Attending: Radiation Oncology | Admitting: Radiation Oncology

## 2020-12-04 VITALS — BP 129/79 | HR 75 | Temp 96.4°F | Wt 189.0 lb

## 2020-12-04 DIAGNOSIS — C3490 Malignant neoplasm of unspecified part of unspecified bronchus or lung: Secondary | ICD-10-CM

## 2020-12-04 NOTE — Progress Notes (Signed)
Radiation Oncology Follow up Note  Name: Wesley Ferguson   Date:   12/04/2020 MRN:  [id number] DOB: [birth year]    This 54 y.o. male presents to the clinic today for 1 month follow-up status post concurrent chemoradiation therapy for stage IIIa (T2 N2 M0) non-small cell lung cancer favoring squamous cell carcinoma of the left lung.[name]  REFERRING PROVIDER: No ref. provider found  HPI: Patient is a 54 year old male now at 1 month having completed concurrent chemoradiation therapy for stage IIIa squamous cell carcinoma the left lung.  Seen today in follow-up he is doing well.  He continues to have a cough had some hemoptysis a few weeks ago although that is totally discontinued.  He is having no PET chest pain.[name]  He has startedmaintenance durvalumab.  He also had a CT scan showed excellent response to therapy with a thin rim of his cystic left lung lesion present although significant response clinically.  He is seeing Dr. Patsey Berthold next week for pulmonary tuneup.  The recent CT scan showed interval response to the left upper lobe lung mass and his thoracic adenopathy.  COMPLICATIONS OF TREATMENT: none  FOLLOW UP COMPLIANCE: keeps appointments   PHYSICAL EXAM:  BP 129/79   Pulse 75   Temp (!) 96.4 F (35.8 C) (Tympanic)   Wt 189 lb (85.7 kg)   BMI 24.27 kg/m  Well-developed well-nourished patient in NAD. HEENT reveals PERLA, EOMI, discs not visualized.  Oral cavity is clear. No oral mucosal lesions are identified. Neck is clear without evidence of cervical or supraclavicular adenopathy. Lungs are clear to A&P. Cardiac examination is essentially unremarkable with regular rate and rhythm without murmur rub or thrill. Abdomen is benign with no organomegaly or masses noted. Motor sensory and DTR levels are equal and symmetric in the upper and lower extremities. Cranial nerves II through XII are grossly intact. Proprioception is intact. No peripheral adenopathy or edema is identified. No motor or sensory  levels are noted. Crude visual fields are within normal range.  RADIOLOGY RESULTS: CT scan reviewed compatible with above-stated findings  PLAN: Present time patient is doing well he is currently on maintenance immunotherapy and tolerating it well.  He will see Dr. Patsey Berthold for work-up of his pulmonary functions.  I have asked to see him back in 4 months for follow-up.  Patient knows to call with any concerns at any time.  I would like to take this opportunity to thank you for allowing me to participate in the care of your patient.Noreene Filbert, MD

## 2020-12-07 ENCOUNTER — Other Ambulatory Visit: Payer: Self-pay

## 2020-12-07 ENCOUNTER — Ambulatory Visit (INDEPENDENT_AMBULATORY_CARE_PROVIDER_SITE_OTHER): Payer: Medicaid Other | Admitting: Pulmonary Disease

## 2020-12-07 ENCOUNTER — Encounter: Payer: Self-pay | Admitting: Pulmonary Disease

## 2020-12-07 VITALS — BP 124/78 | HR 76 | Temp 97.7°F | Ht 74.0 in | Wt 189.4 lb

## 2020-12-07 DIAGNOSIS — F17210 Nicotine dependence, cigarettes, uncomplicated: Secondary | ICD-10-CM | POA: Diagnosis not present

## 2020-12-07 DIAGNOSIS — J440 Chronic obstructive pulmonary disease with acute lower respiratory infection: Secondary | ICD-10-CM | POA: Diagnosis not present

## 2020-12-07 DIAGNOSIS — R0602 Shortness of breath: Secondary | ICD-10-CM | POA: Diagnosis not present

## 2020-12-07 DIAGNOSIS — Z9114 Patient's other noncompliance with medication regimen: Secondary | ICD-10-CM

## 2020-12-07 MED ORDER — AMOXICILLIN-POT CLAVULANATE 875-125 MG PO TABS: 1.0000 | ORAL_TABLET | Freq: Two times a day (BID) | ORAL | 0 refills | Status: AC

## 2020-12-07 MED ORDER — BREZTRI AEROSPHERE 160-9-4.8 MCG/ACT IN AERO: 2.0000 | INHALATION_SPRAY | Freq: Two times a day (BID) | RESPIRATORY_TRACT | 0 refills | Status: DC

## 2020-12-07 MED ORDER — ALBUTEROL SULFATE HFA 108 (90 BASE) MCG/ACT IN AERS: 2.0000 | INHALATION_SPRAY | Freq: Four times a day (QID) | RESPIRATORY_TRACT | 2 refills | Status: DC | PRN

## 2020-12-07 MED ORDER — PREDNISONE 10 MG (21) PO TBPK: ORAL_TABLET | ORAL | 0 refills | Status: DC

## 2020-12-07 NOTE — Progress Notes (Signed)
Subjective:    Patient ID: Wesley Ferguson, male    DOB: Apr 16, 1966, 54 y.o.   MRN: [id number]  HPI 54 year old current smoker (2 PPD) whom we initially evaluated on 20 July 2020 for evaluation of a left lung mass pneumonia.  Was treated with antibiotics and then had bronchoscopy on 07 August 2020 consistent with squamous cell carcinoma.  On a visit of 4 August prior to bronchoscopy, the patient was placed on Stiolto and albuterol as needed.  This was to manage his COPD suggested by initial impression.  The patient also was counseled regards to discontinuation of smoking.  The patient has been followed by Dr. Randa Evens and Dr. Concepcion Living for management of his carcinoma.  He has received current chemoradiation with carbotaxol followed by durvalumab and radiation therapy as per Dr. Baruch Gouty.  On his most recent visit with Dr. Janese Banks on 6 December he complained of increasing shortness of breath and some streaky hemoptysis.  He has also had purulent sputum this (green to yellow).  He tells me he is not using any of his inhalers as previously prescribed.  He cannot tell me the reason why.  He unfortunately continues to smoke 2 packs of cigarettes per day.  He has had no fevers, chills or sweats.  He is taciturn and offers no other complaints.  We are asked by Dr. Janese Banks to reassess him for his shortness of breath.  Review of Systems A 10 point review of systems was performed and it is as noted above otherwise negative.  Patient Active Problem List   Diagnosis Date Noted  . Malignant neoplasm of unspecified part of unspecified bronchus or lung ([place]) 08/14/2020  . Goals of care, counseling/discussion 08/14/2020  . 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 Nicotine dependence 07/20/2020  . Cavitating mass in left upper lung lobe 07/20/2020  . Hyponatremia 07/20/2020  . Postobstructive pneumonia 07/20/2020  . Back pain 11/28/2013  . Lumbar radiculopathy 11/28/2013   Social History    Tobacco Use  . Smoking status: Current Every Day Smoker    Packs/day: 2.00    Years: 35.00    Pack years: 70.00    Types: Cigarettes  . Smokeless tobacco: Former Systems developer    Types: Chew  . Tobacco comment: was smoking 3 ppd but down to 2 pack per day--07/29/2020  Substance Use Topics  . Alcohol use: Yes    Alcohol/week: 8.0 standard drinks    Types: 8 Cans of beer per week   No Known Allergies  Current Meds  Medication Sig  . acetaminophen (TYLENOL) 500 MG tablet Take 500-1,000 mg by mouth every 6 (six) hours as needed (for pain.).  [name] oxyCODONE (OXY IR/ROXICODONE) 5 MG immediate release tablet Take 1 tablet (5 mg total) by mouth every 4 (four) hours as needed for severe pain.  [name] sucralfate (CARAFATE) 1 g tablet Take 1 tablet (1 g total) by mouth 3 (three) times daily. Dissolve in 3-4 tbsp warm water, swish and swallow.  Patient is not taking any inhalers, previously prescribed albuterol as needed and Stiolto maintenance.  There is no immunization history on file for this patient.  Patient declines vaccines.  We discussed Covid-19 precautions.  I reviewed the vaccine effectiveness and potential side effects in detail to include differences between mRNA vaccines and traditional vaccines (attenuated virus).  Discussion also offered of long-term effectiveness and safety profile which are unclear at this time.  Discussed current CDC guidance that all patients are recommended  COVID-19 vaccinations [when available]-as long as they do not have allergy to components of the vaccine.     Objective:   Physical Exam BP 124/78 (BP Location: Left Arm, Cuff Size: Normal)   Pulse 76   Temp 97.7 F (36.5 C) (Temporal)   Ht 6\' 2"  (1.88 m)   Wt 189 lb 6.4 oz (85.9 kg)   SpO2 97%   BMI 24.32 kg/m  GENERAL: Well-developed well-nourished gentleman, no acute distress.  Fully ambulatory. HEAD: Normocephalic, atraumatic.  EYES: Pupils equal, round, reactive to light. No scleral icterus.   MOUTH:  Nose/mouth/throat not examined due to masking requirements for COVID 19. NECK: Supple. No thyromegaly. Trachea midline. No JVD. No adenopathy. PULMONARY: Good air entry bilaterally. Coarse breath sounds, few rhonchi, and expiratory wheezes noted.   CARDIOVASCULAR: S1 and S2. Regular rate and rhythm. No rubs, murmurs or gallops heard. GASTROINTESTINAL: Benign. MUSCULOSKELETAL: No joint deformity, no clubbing, no edema.  NEUROLOGIC: No focal deficit, fluent speech. Appears to have mild memory impairment.  No gait disturbance. SKIN: Intact,warm,dry. Limited exam of skin: No rashes PSYCH: Affect flat, behavior normal.  Representative slice of CT performed 09 November 2020: Independently reviewed.  This shows improvement in his prior cavitary mass.  There is extensive emphysema throughout.      Assessment & Plan:     ICD-10-CM   1. COPD with acute lower respiratory infection ([place])  J44.0    Patient has not been on any medications for his COPD Trial of Breztri 2 puffs twice a day, albuterol as needed For bronchitis: Augmentin/taper pack prednisone  2. Shortness of breath  R06.02    Suspect this is due to the patient's noncompliance with inhalers Will need PFTs in the future  3. Tobacco dependence due to cigarettes  F17.210    Patient counseled regards discontinuation of smoking Total counseling time 3 to 5 minutes  4. Noncompliance with medications  Z91.14    This issue adds complexity to his management   Meds ordered this encounter  Medications  . amoxicillin-clavulanate (AUGMENTIN) 875-125 MG tablet    Sig: Take 1 tablet by mouth 2 (two) times daily for 14 days.    Dispense:  28 tablet    Refill:  0  . predniSONE (STERAPRED UNI-PAK 21 TAB) 10 MG (21) TBPK tablet    Sig: Take as directed in the package    Dispense:  1 each    Refill:  0  . albuterol (VENTOLIN HFA) 108 (90 Base) MCG/ACT inhaler    Sig: Inhale 2 puffs into the lungs every 6 (six) hours as needed for wheezing or  shortness of breath.    Dispense:  8 g    Refill:  2  . Budeson-Glycopyrrol-Formoterol (BREZTRI AEROSPHERE) 160-9-4.8 MCG/ACT AERO    Sig: Inhale 2 puffs into the lungs in the morning and at bedtime.    Dispense:  10.7 g    Refill:  0    Order Specific Question:   Lot Number?    Answer:   [id number]    Order Specific Question:   Expiration Date?    Answer:   05/26/2022    Order Specific Question:   Quantity    Answer:   3    Smoking cessation instruction/counseling given:  counseled patient on the dangers of tobacco use, advised patient to stop smoking, and reviewed strategies to maximize success  Discussion:  Patient has COPD on the basis of emphysema and chronic bronchitis.  He will need PFTs in the future.  He had not been compliant with previously prescribed inhalers.  Suspect that his dyspnea worsened due to this.  In addition, he continues to smoke in excess of a pack a day.  We will treat acute bronchitis episode and patient will be placed on bronchodilators as above.  We will see the patient in 6 to 8 weeks time he is to contact us prior to that time should any new difficulties arise.   Renold Don, MD [facility]   *This note was dictated using voice recognition software/Dragon.  Despite best efforts to proofread, errors can occur which can change the meaning.  Any change was purely unintentional.

## 2020-12-07 NOTE — Patient Instructions (Addendum)
You have prescriptions sent to you to [facility]  An antibiotic (Augmentin) to take twice a day this is for bronchitis 1 tablet twice a day with meals  Prednisone and a taper pack, take it as directed in the package  Albuterol (Ventolin or Proventil) 2 puffs every 6 hours AS NEEDED for shortness of breath or wheezing  We have given you samples of a new inhaler that you will use every day whether you think you need it or not this is what we call a maintenance inhaler and it is Breztri 2 puffs twice a day make sure that you rinse your mouth well after you use it.  See you in follow-up in 6 to 8 weeks time call sooner should any new problems arise.  It is very important that you STOP SMOKING!!!!

## 2020-12-07 NOTE — Progress Notes (Signed)
..  The following Assist/Replace Program for [employer] from [place] has been terminated due to Medicaid starting 11/25/2020.  Last DOS:11/30/2020

## 2020-12-11 ENCOUNTER — Encounter: Payer: Self-pay | Admitting: Pulmonary Disease

## 2020-12-14 ENCOUNTER — Encounter: Payer: Self-pay | Admitting: Oncology

## 2020-12-14 ENCOUNTER — Inpatient Hospital Stay: Payer: Medicaid Other

## 2020-12-14 ENCOUNTER — Inpatient Hospital Stay (HOSPITAL_BASED_OUTPATIENT_CLINIC_OR_DEPARTMENT_OTHER): Payer: Medicaid Other | Admitting: Oncology

## 2020-12-14 VITALS — BP 121/73 | HR 77 | Temp 97.6°F | Resp 18 | Wt 189.4 lb

## 2020-12-14 DIAGNOSIS — Z5112 Encounter for antineoplastic immunotherapy: Secondary | ICD-10-CM

## 2020-12-14 DIAGNOSIS — C3490 Malignant neoplasm of unspecified part of unspecified bronchus or lung: Secondary | ICD-10-CM

## 2020-12-14 LAB — COMPREHENSIVE METABOLIC PANEL
ALT: 14 U/L (ref 0–44)
AST: 17 U/L (ref 15–41)
Albumin: 3.6 g/dL (ref 3.5–5.0)
Alkaline Phosphatase: 49 U/L (ref 38–126)
Anion gap: 9 (ref 5–15)
BUN: 8 mg/dL (ref 6–20)
CO2: 26 mmol/L (ref 22–32)
Calcium: 8.5 mg/dL — ABNORMAL LOW (ref 8.9–10.3)
Chloride: 96 mmol/L — ABNORMAL LOW (ref 98–111)
Creatinine, Ser: 0.96 mg/dL (ref 0.61–1.24)
GFR, Estimated: >60 mL/min (ref >60)
Glucose, Bld: 111 mg/dL — ABNORMAL HIGH (ref 70–99)
Potassium: 3.5 mmol/L (ref 3.5–5.1)
Sodium: 131 mmol/L — ABNORMAL LOW (ref 135–145)
Total Bilirubin: 0.5 mg/dL (ref 0.3–1.2)
Total Protein: 6.4 g/dL — ABNORMAL LOW (ref 6.5–8.1)

## 2020-12-14 LAB — CBC WITH DIFFERENTIAL/PLATELET
Abs Immature Granulocytes: 0.08 10*3/uL — ABNORMAL HIGH (ref 0.00–0.07)
Basophils Absolute: 0.1 10*3/uL (ref 0.0–0.1)
Basophils Relative: 1 %
Eosinophils Absolute: 0.2 10*3/uL (ref 0.0–0.5)
Eosinophils Relative: 2 %
HCT: 37.5 % — ABNORMAL LOW (ref 39.0–52.0)
Hemoglobin: 12.6 g/dL — ABNORMAL LOW (ref 13.0–17.0)
Immature Granulocytes: 1 %
Lymphocytes Relative: 12 %
Lymphs Abs: 1 10*3/uL (ref 0.7–4.0)
MCH: 32.3 pg (ref 26.0–34.0)
MCHC: 33.6 g/dL (ref 30.0–36.0)
MCV: 96.2 fL (ref 80.0–100.0)
Monocytes Absolute: 1 10*3/uL (ref 0.1–1.0)
Monocytes Relative: 12 %
Neutro Abs: 6.1 10*3/uL (ref 1.7–7.7)
Neutrophils Relative %: 72 %
Platelets: 267 10*3/uL (ref 150–400)
RBC: 3.9 MIL/uL — ABNORMAL LOW (ref 4.22–5.81)
RDW: 15 % (ref 11.5–15.5)
WBC: 8.4 10*3/uL (ref 4.0–10.5)
nRBC: 0 % (ref 0.0–0.2)

## 2020-12-14 MED ORDER — SODIUM CHLORIDE 0.9 % IV SOLN
Freq: Once | INTRAVENOUS | Status: AC
  Filled 2020-12-14: qty 250

## 2020-12-14 MED ORDER — SODIUM CHLORIDE 0.9 % IV SOLN
10.0000 mg/kg | Freq: Once | INTRAVENOUS | Status: AC
  Administered 2020-12-14: 10:00:00 860 mg via INTRAVENOUS
  Filled 2020-12-14: qty 10

## 2020-12-14 NOTE — Progress Notes (Signed)
Pt tolerated infusion well with no signs of complications. VSS. Pt stable for discharge.   Wesley Ferguson  

## 2020-12-14 NOTE — Progress Notes (Signed)
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 He had a chest x-ray followed by a CT scan which showed a 5.7 x 4.4 cm left upper lobe mass along with left hilar adenopathy. There was a low-density 3.6 lesion noted in the right hepatic lobe which ultimately turned out to be a hemangioma on MRI liver. PET CT scan showed hypermetabolic activity in the left upper lobe and left hilar nodal tissue. Subcarinal and right paratracheal lymph nodes with mild hypermetabolic features.  Patient underwent bronchoscopies and biopsies.Left upper lobe biopsy was consistent with non-small cell carcinoma favor squamous cell carcinoma right subcarinal lymph node biopsy was negative for malignancy.  Plan is for concurrent chemoradiation with carbotaxol followed by maintenance durvalumab. Patient does not want to get a port placed. He is also refused Covid vaccination  NGS testing showed high tumor mutational burden. PD-L1 90%. MSI stable. Patient PIK3CAand notch2FCHO2 fusion, T p53   Interval history-I had referred patient back to Dr. Patsey Berthold as he was complaining of shortness of breath with moderate exertion likely secondary to his underlying COPD.  Patient was given 1 week's course of prednisone and Augmentin which she  completed today.  He is also on routine inhalers.  Reports that his symptoms are somewhat improved after taking steroids and antibiotics  ECOG PS- 1 Pain scale- 0  Review of systems- Review of Systems  Constitutional: Negative for chills, fever, malaise/fatigue and weight loss.  HENT: Negative for congestion, ear discharge and nosebleeds.   Eyes: Negative for blurred vision.  Respiratory: Negative for cough, hemoptysis, sputum production, shortness of breath and wheezing.   Cardiovascular: Negative for chest pain, palpitations, orthopnea and claudication.  Gastrointestinal: Negative for abdominal pain, blood in stool, constipation, diarrhea, heartburn, melena, nausea and vomiting.  Genitourinary: Negative for dysuria, flank pain, frequency, hematuria and urgency.  Musculoskeletal: Negative for back pain, joint pain and myalgias.  Skin: Negative for rash.  Neurological: Negative for dizziness, tingling, focal weakness, seizures, weakness and headaches.  Endo/Heme/Allergies: Does not bruise/bleed easily.  Psychiatric/Behavioral: Negative for depression and suicidal ideas. The patient does not have insomnia.       No Known Allergies   Past Medical History:  Diagnosis Date  . COPD (chronic obstructive pulmonary disease) ([place])   . Emphysema of lung ([place])   . Lung cancer ([place]) 06/2020  . Pneumonia      Past Surgical History:  Procedure Laterality Date  . BACK SURGERY  2017   lumbar region herniated disc  . VIDEO BRONCHOSCOPY WITH ENDOBRONCHIAL NAVIGATION N/A 08/07/2020   Procedure: VIDEO BRONCHOSCOPY WITH ENDOBRONCHIAL NAVIGATION;  Surgeon: Tyler Pita, MD;  Location: [facility] ORS;  Service: Pulmonary;  Laterality: N/A;  . VIDEO BRONCHOSCOPY WITH ENDOBRONCHIAL ULTRASOUND N/A 08/07/2020   Procedure: VIDEO BRONCHOSCOPY WITH ENDOBRONCHIAL ULTRASOUND;  Surgeon: Tyler Pita, MD;  Location: [facility] ORS;  Service:  Pulmonary;  Laterality: N/A;    Social History   Socioeconomic  History  . Marital status: Divorced    Spouse name: Not on file  . Number of children: Not on file  . Years of education: Not on file  . Highest education level: Not on file  Occupational History  . Not on file  Tobacco Use  . Smoking status: Current Every Day Smoker    Packs/day: 2.00    Years: 35.00    Pack years: 70.00    Types: Cigarettes  . Smokeless tobacco: Former Systems developer    Types: Chew  . Tobacco comment: was smoking 3 ppd but down to 2 pack per day--07/29/2020  Vaping Use  . Vaping Use: Never used  Substance and Sexual Activity  . Alcohol use: Yes    Alcohol/week: 8.0 standard drinks    Types: 8 Cans of beer per week  . Drug use: No  . Sexual activity: Not on file  Other Topics Concern  . Not on file  Social History Narrative  . Not on file   Social Determinants of Health   Financial Resource Strain: Not on file  Food Insecurity: Not on file  Transportation Needs: Not on file  Physical Activity: Not on file  Stress: Not on file  Social Connections: Not on file  Intimate Partner Violence: Not on file    Family History  Problem Relation Age of Onset  . Diabetes Father   . Lung cancer Father 49  . Heart attack Father   . Throat cancer Maternal Aunt      Current Outpatient Medications:  .  acetaminophen (TYLENOL) 500 MG tablet, Take 500-1,000 mg by mouth every 6 (six) hours as needed (for pain.)., Disp: , Rfl:  .  albuterol (VENTOLIN HFA) 108 (90 Base) MCG/ACT inhaler, Inhale 2 puffs into the lungs every 6 (six) hours as needed for wheezing or shortness of breath., Disp: 8 g, Rfl: 2 .  amoxicillin-clavulanate (AUGMENTIN) 875-125 MG tablet, Take 1 tablet by mouth 2 (two) times daily for 14 days., Disp: 28 tablet, Rfl: 0 .  Budeson-Glycopyrrol-Formoterol (BREZTRI AEROSPHERE) 160-9-4.8 MCG/ACT AERO, Inhale 2 puffs into the lungs in the morning and at bedtime., Disp: 10.7 g, Rfl: 0 .  oxyCODONE (OXY IR/ROXICODONE) 5 MG immediate release tablet, Take 1 tablet (5 mg  total) by mouth every 4 (four) hours as needed for severe pain., Disp: 120 tablet, Rfl: 0 .  predniSONE (DELTASONE) 10 MG tablet, TAKE 6 TABLETS ON DAY 1 THEN DECREASE BY 1 TABLET EVERY DAY UNTIL GONE, Disp: , Rfl:  .  sucralfate (CARAFATE) 1 g tablet, Take 1 tablet (1 g total) by mouth 3 (three) times daily. Dissolve in 3-4 tbsp warm water, swish and swallow., Disp: 90 tablet, Rfl: 1 .  Tiotropium Bromide-Olodaterol (STIOLTO RESPIMAT) 2.5-2.5 MCG/ACT AERS, Inhale 2 puffs into the lungs daily., Disp: , Rfl:   Physical exam:  Vitals:   12/14/20 0900  BP: 121/73  Pulse: 77  Resp: 18  Temp: 97.6 F (36.4 C)  TempSrc: Tympanic  SpO2: 100%  Weight: 189 lb 6.4 oz (85.9 kg)   Physical Exam Constitutional:      General: He is not in acute distress. Eyes:     Extraocular Movements: EOM normal.  Cardiovascular:     Rate and Rhythm: Normal rate and regular rhythm.  Pulmonary:     Effort: Pulmonary effort is normal.     Breath sounds: Normal breath sounds.  Skin:    General: Skin is  warm and dry.  Neurological:     Mental Status: He is alert and oriented to person, place, and time.      CMP Latest Ref Rng & Units 12/14/2020  Glucose 70 - 99 mg/dL 111(H)  BUN 6 - 20 mg/dL 8  Creatinine 0.61 - 1.24 mg/dL 0.96  Sodium 135 - 145 mmol/L 131(L)  Potassium 3.5 - 5.1 mmol/L 3.5  Chloride 98 - 111 mmol/L 96(L)  CO2 22 - 32 mmol/L 26  Calcium 8.9 - 10.3 mg/dL 8.5(L)  Total Protein 6.5 - 8.1 g/dL 6.4(L)  Total Bilirubin 0.3 - 1.2 mg/dL 0.5  Alkaline Phos 38 - 126 U/L 49  AST 15 - 41 U/L 17  ALT 0 - 44 U/L 14   CBC Latest Ref Rng & Units 12/14/2020  WBC 4.0 - 10.5 K/uL 8.4  Hemoglobin 13.0 - 17.0 g/dL 12.6(L)  Hematocrit 39.0 - 52.0 % 37.5(L)  Platelets 150 - 400 K/uL 267     Assessment and plan- Patient is a 54 y.o. male with stage III adenocarcinoma of the lung s/p concurrent chemoradiation with partial response here for on treatment assessment prior to cycle 2 of maintenance  durvalumab:  Counts okay to proceed with cycle 2 of maintenance durvalumab today.  He completed prednisone course today.  I will see him back in 2 weeks for cycle 3.  Patient will also continue to follow-up with pulmonary for his underlying COPD.  I have again encouraged the patient to cut down on smoking.     Visit Diagnosis 1. Encounter for antineoplastic immunotherapy   2. Malignant neoplasm of unspecified part of unspecified bronchus or lung ([place])      Dr. Randa Evens, MD, MPH [facility] at [facility] [phone] 12/14/2020 12:58 PM

## 2020-12-28 ENCOUNTER — Ambulatory Visit: Payer: Medicaid Other | Admitting: Oncology

## 2020-12-28 ENCOUNTER — Other Ambulatory Visit: Payer: Medicaid Other

## 2020-12-28 ENCOUNTER — Ambulatory Visit: Payer: Medicaid Other

## 2020-12-29 ENCOUNTER — Other Ambulatory Visit: Payer: Self-pay

## 2020-12-29 ENCOUNTER — Inpatient Hospital Stay: Payer: Medicaid Other

## 2020-12-29 ENCOUNTER — Inpatient Hospital Stay (HOSPITAL_BASED_OUTPATIENT_CLINIC_OR_DEPARTMENT_OTHER): Payer: Medicaid Other | Admitting: Oncology

## 2020-12-29 ENCOUNTER — Encounter: Payer: Self-pay | Admitting: Oncology

## 2020-12-29 ENCOUNTER — Inpatient Hospital Stay: Payer: Medicaid Other | Attending: Oncology

## 2020-12-29 VITALS — BP 126/78 | HR 72 | Temp 98.1°F | Resp 16 | Ht 74.0 in | Wt 186.0 lb

## 2020-12-29 DIAGNOSIS — D1803 Hemangioma of intra-abdominal structures: Secondary | ICD-10-CM | POA: Insufficient documentation

## 2020-12-29 DIAGNOSIS — Z79899 Other long term (current) drug therapy: Secondary | ICD-10-CM | POA: Diagnosis not present

## 2020-12-29 DIAGNOSIS — Z8249 Family history of ischemic heart disease and other diseases of the circulatory system: Secondary | ICD-10-CM | POA: Insufficient documentation

## 2020-12-29 DIAGNOSIS — F17210 Nicotine dependence, cigarettes, uncomplicated: Secondary | ICD-10-CM | POA: Diagnosis not present

## 2020-12-29 DIAGNOSIS — C3412 Malignant neoplasm of upper lobe, left bronchus or lung: Secondary | ICD-10-CM | POA: Diagnosis present

## 2020-12-29 DIAGNOSIS — J449 Chronic obstructive pulmonary disease, unspecified: Secondary | ICD-10-CM | POA: Diagnosis not present

## 2020-12-29 DIAGNOSIS — C3490 Malignant neoplasm of unspecified part of unspecified bronchus or lung: Secondary | ICD-10-CM

## 2020-12-29 DIAGNOSIS — Z7289 Other problems related to lifestyle: Secondary | ICD-10-CM | POA: Diagnosis not present

## 2020-12-29 DIAGNOSIS — Z833 Family history of diabetes mellitus: Secondary | ICD-10-CM | POA: Diagnosis not present

## 2020-12-29 DIAGNOSIS — Z5112 Encounter for antineoplastic immunotherapy: Secondary | ICD-10-CM | POA: Diagnosis not present

## 2020-12-29 DIAGNOSIS — R590 Localized enlarged lymph nodes: Secondary | ICD-10-CM | POA: Diagnosis not present

## 2020-12-29 DIAGNOSIS — L299 Pruritus, unspecified: Secondary | ICD-10-CM | POA: Insufficient documentation

## 2020-12-29 DIAGNOSIS — Z801 Family history of malignant neoplasm of trachea, bronchus and lung: Secondary | ICD-10-CM | POA: Insufficient documentation

## 2020-12-29 LAB — CBC WITH DIFFERENTIAL/PLATELET
Abs Immature Granulocytes: 0.03 10*3/uL (ref 0.00–0.07)
Basophils Absolute: 0.1 10*3/uL (ref 0.0–0.1)
Basophils Relative: 1 %
Eosinophils Absolute: 0.2 10*3/uL (ref 0.0–0.5)
Eosinophils Relative: 4 %
HCT: 38.9 % — ABNORMAL LOW (ref 39.0–52.0)
Hemoglobin: 13.6 g/dL (ref 13.0–17.0)
Immature Granulocytes: 1 %
Lymphocytes Relative: 15 %
Lymphs Abs: 0.8 10*3/uL (ref 0.7–4.0)
MCH: 33.3 pg (ref 26.0–34.0)
MCHC: 35 g/dL (ref 30.0–36.0)
MCV: 95.3 fL (ref 80.0–100.0)
Monocytes Absolute: 0.7 10*3/uL (ref 0.1–1.0)
Monocytes Relative: 12 %
Neutro Abs: 3.7 10*3/uL (ref 1.7–7.7)
Neutrophils Relative %: 67 %
Platelets: 247 10*3/uL (ref 150–400)
RBC: 4.08 MIL/uL — ABNORMAL LOW (ref 4.22–5.81)
RDW: 13.1 % (ref 11.5–15.5)
WBC: 5.5 10*3/uL (ref 4.0–10.5)
nRBC: 0 % (ref 0.0–0.2)

## 2020-12-29 LAB — COMPREHENSIVE METABOLIC PANEL
ALT: 12 U/L (ref 0–44)
AST: 19 U/L (ref 15–41)
Albumin: 3.8 g/dL (ref 3.5–5.0)
Alkaline Phosphatase: 62 U/L (ref 38–126)
Anion gap: 8 (ref 5–15)
BUN: 6 mg/dL (ref 6–20)
CO2: 24 mmol/L (ref 22–32)
Calcium: 8.8 mg/dL — ABNORMAL LOW (ref 8.9–10.3)
Chloride: 99 mmol/L (ref 98–111)
Creatinine, Ser: 0.75 mg/dL (ref 0.61–1.24)
GFR, Estimated: >60 mL/min (ref >60)
Glucose, Bld: 123 mg/dL — ABNORMAL HIGH (ref 70–99)
Potassium: 4.4 mmol/L (ref 3.5–5.1)
Sodium: 131 mmol/L — ABNORMAL LOW (ref 135–145)
Total Bilirubin: 0.5 mg/dL (ref 0.3–1.2)
Total Protein: 6.7 g/dL (ref 6.5–8.1)

## 2020-12-29 MED ORDER — SODIUM CHLORIDE 0.9 % IV SOLN
Freq: Once | INTRAVENOUS | Status: AC
  Filled 2020-12-29: qty 250

## 2020-12-29 MED ORDER — SODIUM CHLORIDE 0.9 % IV SOLN
10.0000 mg/kg | Freq: Once | INTRAVENOUS | Status: AC
  Administered 2020-12-29: 860 mg via INTRAVENOUS
  Filled 2020-12-29: qty 10

## 2020-12-29 NOTE — Progress Notes (Signed)
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 He had a chest x-ray followed by a CT scan which showed a 5.7 x 4.4 cm left upper lobe mass along with left hilar adenopathy. There was a low-density 3.6 lesion noted in the right hepatic lobe which ultimately turned out to be a hemangioma on MRI liver. PET CT scan showed hypermetabolic activity in the left upper lobe and left hilar nodal tissue. Subcarinal and right paratracheal lymph nodes with mild hypermetabolic features.  Patient underwent bronchoscopies and biopsies.Left upper lobe biopsy was consistent with non-small cell carcinoma favor squamous cell carcinoma right subcarinal lymph node biopsy was negative for malignancy.  Plan is for concurrent chemoradiation with carbotaxol followed by maintenance durvalumab. Patient does not want to get a port placed. He is also refused Covid vaccination  NGS testing showed high tumor mutational burden. PD-L1 90%. MSI stable. Patient PIK3CAand notch2FCHO2 fusion, T p53   Interval history-reports occasional cough but denies other complaints at this time.  Has mild baseline fatigue  ECOG PS- 1 Pain scale- 0   Review of systems- Review of Systems  Constitutional: Negative for chills, fever,  malaise/fatigue and weight loss.  HENT: Negative for congestion, ear discharge and nosebleeds.   Eyes: Negative for blurred vision.  Respiratory: Positive for cough. Negative for hemoptysis, sputum production, shortness of breath and wheezing.   Cardiovascular: Negative for chest pain, palpitations, orthopnea and claudication.  Gastrointestinal: Negative for abdominal pain, blood in stool, constipation, diarrhea, heartburn, melena, nausea and vomiting.  Genitourinary: Negative for dysuria, flank pain, frequency, hematuria and urgency.  Musculoskeletal: Negative for back pain, joint pain and myalgias.  Skin: Negative for rash.  Neurological: Negative for dizziness, tingling, focal weakness, seizures, weakness and headaches.  Endo/Heme/Allergies: Does not bruise/bleed easily.  Psychiatric/Behavioral: Negative for depression and suicidal ideas. The patient does not have insomnia.       No Known Allergies   Past Medical History:  Diagnosis Date  . COPD (chronic obstructive pulmonary disease) ([place])   . Emphysema of lung ([place])   . Lung cancer ([place]) 06/2020  . Pneumonia      Past Surgical History:  Procedure Laterality Date  . BACK SURGERY  2017   lumbar region herniated disc  . VIDEO BRONCHOSCOPY WITH ENDOBRONCHIAL NAVIGATION N/A 08/07/2020   Procedure: VIDEO BRONCHOSCOPY WITH ENDOBRONCHIAL NAVIGATION;  Surgeon: Tyler Pita, MD;  Location: [facility] ORS;  Service: Pulmonary;  Laterality: N/A;  . VIDEO BRONCHOSCOPY WITH ENDOBRONCHIAL ULTRASOUND N/A 08/07/2020   Procedure: VIDEO BRONCHOSCOPY WITH ENDOBRONCHIAL ULTRASOUND;  Surgeon: Tyler Pita, MD;  Location: [facility] ORS;  Service: Pulmonary;  Laterality: N/A;    Social History   Socioeconomic History  . Marital status: Divorced    Spouse name: Not on file  . Number of children: Not on file  . Years of education: Not on  file  . Highest education level: Not on file  Occupational History  . Not on file  Tobacco Use  .  Smoking status: Current Every Day Smoker    Packs/day: 2.00    Years: 35.00    Pack years: 70.00    Types: Cigarettes  . Smokeless tobacco: Former Systems developer    Types: Chew  . Tobacco comment: was smoking 3 ppd but down to 2 pack per day--07/29/2020  Vaping Use  . Vaping Use: Never used  Substance and Sexual Activity  . Alcohol use: Yes    Alcohol/week: 8.0 standard drinks    Types: 8 Cans of beer per week  . Drug use: No  . Sexual activity: Not on file  Other Topics Concern  . Not on file  Social History Narrative  . Not on file   Social Determinants of Health   Financial Resource Strain: Not on file  Food Insecurity: Not on file  Transportation Needs: Not on file  Physical Activity: Not on file  Stress: Not on file  Social Connections: Not on file  Intimate Partner Violence: Not on file    Family History  Problem Relation Age of Onset  . Diabetes Father   . Lung cancer Father 60  . Heart attack Father   . Throat cancer Maternal Aunt      Current Outpatient Medications:  .  acetaminophen (TYLENOL) 500 MG tablet, Take 500-1,000 mg by mouth every 6 (six) hours as needed (for pain.)., Disp: , Rfl:  .  albuterol (VENTOLIN HFA) 108 (90 Base) MCG/ACT inhaler, Inhale 2 puffs into the lungs every 6 (six) hours as needed for wheezing or shortness of breath., Disp: 8 g, Rfl: 2 .  Budeson-Glycopyrrol-Formoterol (BREZTRI AEROSPHERE) 160-9-4.8 MCG/ACT AERO, Inhale 2 puffs into the lungs in the morning and at bedtime., Disp: 10.7 g, Rfl: 0 .  oxyCODONE (OXY IR/ROXICODONE) 5 MG immediate release tablet, Take 1 tablet (5 mg total) by mouth every 4 (four) hours as needed for severe pain., Disp: 120 tablet, Rfl: 0 .  predniSONE (DELTASONE) 10 MG tablet, TAKE 6 TABLETS ON DAY 1 THEN DECREASE BY 1 TABLET EVERY DAY UNTIL GONE, Disp: , Rfl:  .  sucralfate (CARAFATE) 1 g tablet, Take 1 tablet (1 g total) by mouth 3 (three) times daily. Dissolve in 3-4 tbsp warm water, swish and swallow., Disp: 90  tablet, Rfl: 1 .  Tiotropium Bromide-Olodaterol (STIOLTO RESPIMAT) 2.5-2.5 MCG/ACT AERS, Inhale 2 puffs into the lungs daily., Disp: , Rfl:   Physical exam: There were no vitals filed for this visit. Physical Exam Eyes:     Extraocular Movements: EOM normal.  Cardiovascular:     Rate and Rhythm: Normal rate and regular rhythm.     Heart sounds: Normal heart sounds.  Pulmonary:     Effort: Pulmonary effort is normal.     Breath sounds: Normal breath sounds.  Skin:    General: Skin is warm and dry.  Neurological:     Mental Status: He is alert and oriented to person, place, and time.      CMP Latest Ref Rng & Units 12/14/2020  Glucose 70 - 99 mg/dL 111(H)  BUN 6 - 20 mg/dL 8  Creatinine 0.61 - 1.24 mg/dL 0.96  Sodium 135 - 145 mmol/L 131(L)  Potassium 3.5 - 5.1 mmol/L 3.5  Chloride 98 - 111 mmol/L 96(L)  CO2 22 - 32 mmol/L 26  Calcium 8.9 - 10.3 mg/dL 8.5(L)  Total Protein 6.5 - 8.1 g/dL 6.4(L)  Total Bilirubin 0.3 - 1.2 mg/dL 0.5  Alkaline Phos 38 - 126 U/L 49  AST 15 - 41 U/L 17  ALT 0 - 44 U/L 14   CBC Latest Ref Rng & Units 12/29/2020  WBC 4.0 - 10.5 K/uL 5.5  Hemoglobin 13.0 - 17.0 g/dL 13.6  Hematocrit 39.0 - 52.0 % 38.9(L)  Platelets 150 - 400 K/uL 247      Assessment and plan- Patient is a 55 y.o. male withh/ostage III squamous cell carcinoma of the left upper lobe T3 N1 M0.He is here for on treatment assessment prior to cycle 3 of maintenance durvalumab  Counts ok to proceed with cycle 3 of maintenance durvalumab today I will see him in 2 weeks for cycle 4. Scans after 6 cycles.  Patient is tolerating immunotherapy well without any significant side effects  COPD: Follows up with Dr. Patsey Berthold and is currently on multiple inhalers.    Visit Diagnosis 1. Encounter for antineoplastic immunotherapy   2. Malignant neoplasm of unspecified part of unspecified bronchus or lung ([place])      Dr. Randa Evens, MD, MPH [facility] at [facility] [phone] 12/29/2020 8:35 AM

## 2020-12-29 NOTE — Progress Notes (Signed)
Pt tolerated infusion well with no complications. VSS. Pt stable for discharge.   [employer]

## 2020-12-29 NOTE — Progress Notes (Signed)
Pt was prescribed augmentin from dr Patsey Berthold 12/13 but pt states he still has 3 more pills to take

## 2021-01-12 ENCOUNTER — Inpatient Hospital Stay: Payer: Medicaid Other

## 2021-01-12 ENCOUNTER — Inpatient Hospital Stay: Payer: Medicaid Other | Admitting: Oncology

## 2021-01-12 NOTE — Progress Notes (Deleted)
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 He had a chest x-ray followed by a CT scan which showed a 5.7 x 4.4 cm left upper lobe mass along with left hilar adenopathy. There was a low-density 3.6 lesion noted in the right hepatic lobe which ultimately turned out to be a hemangioma on MRI liver. PET CT scan showed hypermetabolic activity in the left upper lobe and left hilar nodal tissue. Subcarinal and right paratracheal lymph nodes with mild hypermetabolic features.  Patient underwent bronchoscopies and biopsies.Left upper lobe biopsy was consistent with non-small cell carcinoma favor squamous cell carcinoma right subcarinal lymph node biopsy was negative for malignancy.  Patient completed concurrent chemoradiation with weekly CarboTaxol.  Repeat scan showed partial response and patient is currently on maintenance durvalumab since December 2021.  He is also refused Covid vaccination  NGS testing showed high tumor mutational burden. PD-L1 90%. MSI stable. Patient PIK3CAand notch2FCHO2 fusion, T p53   Interval history- ***  ECOG PS- *** Pain scale- *** Opioid associated constipation- ***  Review of systems- Review of Systems  Constitutional: Negative for chills, fever, malaise/fatigue and  weight loss.  HENT: Negative for congestion, ear discharge and nosebleeds.   Eyes: Negative for blurred vision.  Respiratory: Negative for cough, hemoptysis, sputum production, shortness of breath and wheezing.   Cardiovascular: Negative for chest pain, palpitations, orthopnea and claudication.  Gastrointestinal: Negative for abdominal pain, blood in stool, constipation, diarrhea, heartburn, melena, nausea and vomiting.  Genitourinary: Negative for dysuria, flank pain, frequency, hematuria and urgency.  Musculoskeletal: Negative for back pain, joint pain and myalgias.  Skin: Negative for rash.  Neurological: Negative for dizziness, tingling, focal weakness, seizures, weakness and headaches.  Endo/Heme/Allergies: Does not bruise/bleed easily.  Psychiatric/Behavioral: Negative for depression and suicidal ideas. The patient does not have insomnia.       No Known Allergies   Past Medical History:  Diagnosis Date  . COPD (chronic obstructive pulmonary disease) ([place])   . Emphysema of lung ([place])   . Lung cancer ([place]) 06/2020  . Pneumonia      Past Surgical History:  Procedure Laterality Date  . BACK SURGERY  2017   lumbar region herniated disc  . VIDEO BRONCHOSCOPY WITH ENDOBRONCHIAL NAVIGATION N/A 08/07/2020   Procedure: VIDEO BRONCHOSCOPY WITH ENDOBRONCHIAL NAVIGATION;  Surgeon: Tyler Pita, MD;  Location: [facility] ORS;  Service: Pulmonary;  Laterality: N/A;  . VIDEO BRONCHOSCOPY WITH ENDOBRONCHIAL ULTRASOUND N/A 08/07/2020   Procedure: VIDEO BRONCHOSCOPY WITH ENDOBRONCHIAL ULTRASOUND;  Surgeon: Tyler Pita, MD;  Location: [facility] ORS;  Service: Pulmonary;  Laterality: N/A;    Social History   Socioeconomic History  . Marital status: Divorced    Spouse name: Not on file  . Number of children: Not on file  . Years of education: Not on file  . Highest education level: Not on  file  Occupational History  . Not on file  Tobacco Use  . Smoking status: Current Every Day  Smoker    Packs/day: 2.00    Years: 35.00    Pack years: 70.00    Types: Cigarettes  . Smokeless tobacco: Former Systems developer    Types: Chew  . Tobacco comment: was smoking 3 ppd but down to 2 pack per day--07/29/2020  Vaping Use  . Vaping Use: Never used  Substance and Sexual Activity  . Alcohol use: Yes    Alcohol/week: 8.0 standard drinks    Types: 8 Cans of beer per week  . Drug use: No  . Sexual activity: Yes  Other Topics Concern  . Not on file  Social History Narrative  . Not on file   Social Determinants of Health   Financial Resource Strain: Not on file  Food Insecurity: Not on file  Transportation Needs: Not on file  Physical Activity: Not on file  Stress: Not on file  Social Connections: Not on file  Intimate Partner Violence: Not on file    Family History  Problem Relation Age of Onset  . Diabetes Father   . Lung cancer Father 39  . Heart attack Father   . Throat cancer Maternal Aunt      Current Outpatient Medications:  .  acetaminophen (TYLENOL) 500 MG tablet, Take 500-1,000 mg by mouth every 6 (six) hours as needed (for pain.)., Disp: , Rfl:  .  albuterol (VENTOLIN HFA) 108 (90 Base) MCG/ACT inhaler, Inhale 2 puffs into the lungs every 6 (six) hours as needed for wheezing or shortness of breath., Disp: 8 g, Rfl: 2 .  Budeson-Glycopyrrol-Formoterol (BREZTRI AEROSPHERE) 160-9-4.8 MCG/ACT AERO, Inhale 2 puffs into the lungs in the morning and at bedtime., Disp: 10.7 g, Rfl: 0 .  oxyCODONE (OXY IR/ROXICODONE) 5 MG immediate release tablet, Take 1 tablet (5 mg total) by mouth every 4 (four) hours as needed for severe pain., Disp: 120 tablet, Rfl: 0 .  sucralfate (CARAFATE) 1 g tablet, Take 1 tablet (1 g total) by mouth 3 (three) times daily. Dissolve in 3-4 tbsp warm water, swish and swallow. (Patient not taking: Reported on 12/29/2020), Disp: 90 tablet, Rfl: 1 .  Tiotropium Bromide-Olodaterol (STIOLTO RESPIMAT) 2.5-2.5 MCG/ACT AERS, Inhale 2 puffs into the lungs daily.  (Patient not taking: Reported on 12/29/2020), Disp: , Rfl:   Physical exam: There were no vitals filed for this visit. Physical Exam HENT:     Head: Normocephalic and atraumatic.  Eyes:     Extraocular Movements: EOM normal.     Pupils: Pupils are equal, round, and reactive to light.  Cardiovascular:     Rate and Rhythm: Normal rate and regular rhythm.     Heart sounds: Normal heart sounds.  Pulmonary:     Effort: Pulmonary effort is normal.     Breath sounds: Normal breath sounds.  Abdominal:     General: Bowel sounds are normal.     Palpations: Abdomen is soft.  Musculoskeletal:     Cervical back: Normal range of motion.  Skin:    General: Skin is warm and dry.  Neurological:     Mental Status: He is alert and oriented to person, place, and time.      CMP Latest Ref Rng & Units 12/29/2020  Glucose 70 - 99 mg/dL 123(H)  BUN 6 - 20 mg/dL 6  Creatinine 0.61 - 1.24 mg/dL 0.75  Sodium 135 - 145 mmol/L 131(L)  Potassium 3.5 - 5.1 mmol/L 4.4  Chloride 98 - 111 mmol/L 99  CO2 22 - 32 mmol/L 24  Calcium 8.9 - 10.3 mg/dL 8.8(L)  Total Protein 6.5 - 8.1 g/dL 6.7  Total Bilirubin 0.3 - 1.2 mg/dL 0.5  Alkaline Phos 38 - 126 U/L 62  AST 15 - 41 U/L 19  ALT 0 - 44 U/L 12   CBC Latest Ref Rng & Units 12/29/2020  WBC 4.0 - 10.5 K/uL 5.5  Hemoglobin 13.0 - 17.0 g/dL 13.6  Hematocrit 39.0 - 52.0 % 38.9(L)  Platelets 150 - 400 K/uL 247     Assessment and plan- Patient is a 55 y.o. male withh/ostage III squamous cell carcinoma of the left upper lobe T3 N1 M0. He is here for on treatment assessment prior to cycle 4 of maintenance durvalumab  Counts okay to proceed with cycle 4 of maintenance durvalumab today.  I will see him back in 2 weeks for cycle 5.  Plan to repeat scans after 6 treatments  So far patient is tolerating immunotherapy well without any significant side effects   Visit Diagnosis 1. Malignant neoplasm of unspecified part of unspecified bronchus or lung ([place])   2.  Encounter for antineoplastic immunotherapy      Dr. Randa Evens, MD, MPH [facility] [phone] 01/12/2021 9:42 AM

## 2021-01-18 ENCOUNTER — Ambulatory Visit: Payer: Medicaid Other | Admitting: Pulmonary Disease

## 2021-01-19 ENCOUNTER — Other Ambulatory Visit: Payer: Self-pay

## 2021-01-19 ENCOUNTER — Ambulatory Visit (INDEPENDENT_AMBULATORY_CARE_PROVIDER_SITE_OTHER): Payer: Medicaid Other | Admitting: Pulmonary Disease

## 2021-01-19 ENCOUNTER — Encounter: Payer: Self-pay | Admitting: Pulmonary Disease

## 2021-01-19 VITALS — BP 128/72 | HR 80 | Temp 97.7°F | Ht 74.0 in | Wt 185.8 lb

## 2021-01-19 DIAGNOSIS — F17210 Nicotine dependence, cigarettes, uncomplicated: Secondary | ICD-10-CM

## 2021-01-19 DIAGNOSIS — C3491 Malignant neoplasm of unspecified part of right bronchus or lung: Secondary | ICD-10-CM | POA: Diagnosis not present

## 2021-01-19 DIAGNOSIS — J449 Chronic obstructive pulmonary disease, unspecified: Secondary | ICD-10-CM | POA: Diagnosis not present

## 2021-01-19 DIAGNOSIS — R0602 Shortness of breath: Secondary | ICD-10-CM

## 2021-01-19 MED ORDER — BREZTRI AEROSPHERE 160-9-4.8 MCG/ACT IN AERO: 2.0000 | INHALATION_SPRAY | Freq: Two times a day (BID) | RESPIRATORY_TRACT | 0 refills | Status: AC

## 2021-01-19 MED ORDER — BREZTRI AEROSPHERE 160-9-4.8 MCG/ACT IN AERO: 2.0000 | INHALATION_SPRAY | Freq: Two times a day (BID) | RESPIRATORY_TRACT | 11 refills | Status: DC

## 2021-01-19 NOTE — Progress Notes (Signed)
Subjective:    Patient ID: Wesley Ferguson, male    DOB: 1966/06/09, 55 y.o.   MRN: [id number]  HPI 55 year old current smoker (2 PPD) whom we initially evaluated on 20 July 2020 for a left lung mass and postobstructive pneumonia.  Was treated with antibiotics and then had bronchoscopy on 07 August 2020 consistent with squamous cell carcinoma.  On a visit of 4 August prior to bronchoscopy, the patient was placed on Stiolto and albuterol as needed.  This was to manage his COPD suggested by initial impression.  The patient also was counseled regards to discontinuation of smoking.  The patient has been followed by Dr. Randa Evens and Dr. Alda Berthold for management of his carcinoma.  He has received concurrent chemoradiation with carbotaxol followed by durvalumab and radiation therapy as per Dr. Baruch Gouty.  On a visit with Dr. Janese Banks on 6 December he complained of increasing shortness of breath and some streaky hemoptysis.  He has also had purulent sputum this (green to yellow).  He was evaluated by me on 13 December for this issue.  He was not using his inhalers as prescribed.  During his visit on 13 December he was given a trial of Breztri 2 puffs twice a day, samples were provided.  He was noted to have an acute bronchitis and was treated with Augmentin and a taper pack of prednisone.  Since then the patient notes that he has been doing markedly better.  He notes that his dyspnea is resolved with Breztri.  He has not needed use of rescue inhaler much.He continues to smoke 2 packs of cigarettes per day.   He has had no fevers, chills or sweats.  He is taciturn and offers no other complaints.    Review of Systems A 10 point review of systems was performed and it is as noted above otherwise negative.  Patient Active Problem List   Diagnosis Date Noted  . Malignant neoplasm of unspecified part of unspecified bronchus or lung ([place]) 08/14/2020  . Goals of care, counseling/discussion 08/14/2020  . Cavitating  mass of upper lobe of left lung 07/20/2020  . Nicotine dependence 07/20/2020  . Cavitating mass in left upper lung lobe 07/20/2020  . Hyponatremia 07/20/2020  . Postobstructive pneumonia 07/20/2020  . Back pain 11/28/2013  . Lumbar radiculopathy 11/28/2013   Social History   Tobacco Use  . Smoking status: Current Every Day Smoker    Packs/day: 3.00    Years: 35.00    Pack years: 105.00    Types: Cigarettes  . Smokeless tobacco: Former Systems developer    Types: Chew  . Tobacco comment: 2PPD 01/19/2021  Substance Use Topics  . Alcohol use: Yes    Alcohol/week: 8.0 standard drinks    Types: 8 Cans of beer per week    No Known Allergies   Current Meds  Medication Sig  . acetaminophen (TYLENOL) 500 MG tablet Take 500-1,000 mg by mouth every 6 (six) hours as needed (for pain.).  [name] albuterol (VENTOLIN HFA) 108 (90 Base) MCG/ACT inhaler Inhale 2 puffs into the lungs every 6 (six) hours as needed for wheezing or shortness of breath.  . Budeson-Glycopyrrol-Formoterol (BREZTRI AEROSPHERE) 160-9-4.8 MCG/ACT AERO Inhale 2 puffs into the lungs in the morning and at bedtime.  Derrill Memo ON 01/27/2021] Budeson-Glycopyrrol-Formoterol (BREZTRI AEROSPHERE) 160-9-4.8 MCG/ACT AERO Inhale 2 puffs into the lungs in the morning and at bedtime.  . [EXPIRED] Budeson-Glycopyrrol-Formoterol (BREZTRI AEROSPHERE) 160-9-4.8 MCG/ACT AERO Inhale 2 puffs into the lungs 2 (two) times daily  for 1 day.  . oxyCODONE (OXY IR/ROXICODONE) 5 MG immediate release tablet Take 1 tablet (5 mg total) by mouth every 4 (four) hours as needed for severe pain.  [name] sucralfate (CARAFATE) 1 g tablet Take 1 tablet (1 g total) by mouth 3 (three) times daily. Dissolve in 3-4 tbsp warm water, swish and swallow.    There is no immunization history on file for this patient.'  We discussed Covid-19 precautions.  I reviewed the vaccine effectiveness and potential side effects in detail to include differences between mRNA vaccines and traditional vaccines  (attenuated virus).  Discussion also offered of long-term effectiveness and safety profile which are unclear at this time.  Discussed current CDC guidance that all patients are recommended COVID-19 vaccinations -as long as they do not have allergy to components of the vaccine.     Objective:   Physical Exam BP 128/72 (BP Location: Left Arm, Cuff Size: Normal)   Pulse 80   Temp 97.7 F (36.5 C) (Temporal)   Ht 6\' 2"  (1.88 m)   Wt 185 lb 12.8 oz (84.3 kg)   SpO2 98%   BMI 23.86 kg/m  GENERAL: Well-developed well-nourished gentleman, no acute distress.Fully ambulatory.  HEAD: Normocephalic, atraumatic.  EYES: Pupils equal, round, reactive to light. No scleral icterus.  MOUTH: Nose/mouth/throat not examined due to masking requirements for COVID 19. NECK: Supple. No thyromegaly. Trachea midline. No JVD. No adenopathy. PULMONARY: Good air entry bilaterally. Coarse breath sounds, no other adventitious sounds. CARDIOVASCULAR: S1 and S2. Regular rate and rhythm. No rubs, murmurs or gallops heard. GASTROINTESTINAL: Benign. MUSCULOSKELETAL: No joint deformity, no clubbing, no edema.  NEUROLOGIC: No focal deficit, fluent speech. Appears to have mild memory impairment.No gait disturbance. SKIN: Intact,warm,dry. Limited examofskin: No rashes PSYCH:Affect flat,behavior normal.    Assessment & Plan:     ICD-10-CM   1. Chronic obstructive pulmonary disease, unspecified COPD type ([place])  J44.9 Pulmonary Function Test [facility] Only   Continue Breztri 2 puffs twice a day May continue use of as needed albuterol Obtain PFTs Follow-up in 3 months time call sooner should any new problems arise  2. Shortness of breath  R06.02    Markedly improved on Breztri Advised to discontinue smoking  3. Squamous cell lung cancer, right (HCC)  C34.91    He is to continue follow-up with oncology  4. Tobacco dependence due to cigarettes  F17.210    Patient counseled regards discontinuation of  smoking Total counseling time 3 to 5 minutes   Orders Placed This Encounter  Procedures  . Pulmonary Function Test [facility] Only    Standing Status:   Future    Standing Expiration Date:   01/19/2022    Scheduling Instructions:     2-3wk    Order Specific Question:   Full PFT: includes the following: basic spirometry, spirometry pre & post bronchodilator, diffusion capacity (DLCO), lung volumes    Answer:   Full PFT   Meds ordered this encounter  Medications  . Budeson-Glycopyrrol-Formoterol (BREZTRI AEROSPHERE) 160-9-4.8 MCG/ACT AERO    Sig: Inhale 2 puffs into the lungs in the morning and at bedtime.    Dispense:  10.7 g    Refill:  11  . Budeson-Glycopyrrol-Formoterol (BREZTRI AEROSPHERE) 160-9-4.8 MCG/ACT AERO    Sig: Inhale 2 puffs into the lungs 2 (two) times daily for 1 day.    Dispense:  5.9 g    Refill:  0    Order Specific Question:   Lot Number?    Answer:   [id number]  Order Specific Question:   Expiration Date?    Answer:   06/25/2022    Order Specific Question:   Manufacturer?    Answer:   [employer]stra[employer] [[id number]]    Order Specific Question:   Quantity    Answer:   2   Smoking cessation instruction/counseling given:  counseled patient on the dangers of tobacco use, advised patient to stop smoking, and reviewed strategies to maximize success.   Renold Don, MD [place]   *This note was dictated using voice recognition software/Dragon.  Despite best efforts to proofread, errors can occur which can change the meaning.  Any change was purely unintentional.

## 2021-01-19 NOTE — Patient Instructions (Addendum)
Continue Breztri 2 puffs twice a day  You may use your rescue inhaler 2-3 times a day as needed for shortness of breath  STOP SMOKING!!  Can follow-up in 3 months time.  Call sooner should any new difficulties arise   We will reschedule your breathing test

## 2021-01-21 ENCOUNTER — Inpatient Hospital Stay: Payer: Medicaid Other

## 2021-01-21 ENCOUNTER — Inpatient Hospital Stay (HOSPITAL_BASED_OUTPATIENT_CLINIC_OR_DEPARTMENT_OTHER): Payer: Medicaid Other | Admitting: Oncology

## 2021-01-21 ENCOUNTER — Other Ambulatory Visit: Payer: Self-pay

## 2021-01-21 ENCOUNTER — Other Ambulatory Visit: Payer: Self-pay | Admitting: *Deleted

## 2021-01-21 VITALS — BP 127/78 | HR 72 | Temp 96.0°F | Resp 16 | Wt 184.3 lb

## 2021-01-21 DIAGNOSIS — C3490 Malignant neoplasm of unspecified part of unspecified bronchus or lung: Secondary | ICD-10-CM | POA: Diagnosis not present

## 2021-01-21 DIAGNOSIS — L299 Pruritus, unspecified: Secondary | ICD-10-CM | POA: Diagnosis not present

## 2021-01-21 DIAGNOSIS — Z5112 Encounter for antineoplastic immunotherapy: Secondary | ICD-10-CM | POA: Diagnosis not present

## 2021-01-21 LAB — CBC WITH DIFFERENTIAL/PLATELET
Abs Immature Granulocytes: 0.02 10*3/uL (ref 0.00–0.07)
Basophils Absolute: 0.1 10*3/uL (ref 0.0–0.1)
Basophils Relative: 1 %
Eosinophils Absolute: 0.4 10*3/uL (ref 0.0–0.5)
Eosinophils Relative: 6 %
HCT: 40.5 % (ref 39.0–52.0)
Hemoglobin: 13.9 g/dL (ref 13.0–17.0)
Immature Granulocytes: 0 %
Lymphocytes Relative: 13 %
Lymphs Abs: 1 10*3/uL (ref 0.7–4.0)
MCH: 32.2 pg (ref 26.0–34.0)
MCHC: 34.3 g/dL (ref 30.0–36.0)
MCV: 93.8 fL (ref 80.0–100.0)
Monocytes Absolute: 0.9 10*3/uL (ref 0.1–1.0)
Monocytes Relative: 13 %
Neutro Abs: 5.1 10*3/uL (ref 1.7–7.7)
Neutrophils Relative %: 67 %
Platelets: 219 10*3/uL (ref 150–400)
RBC: 4.32 MIL/uL (ref 4.22–5.81)
RDW: 11.9 % (ref 11.5–15.5)
WBC: 7.5 10*3/uL (ref 4.0–10.5)
nRBC: 0 % (ref 0.0–0.2)

## 2021-01-21 LAB — COMPREHENSIVE METABOLIC PANEL
ALT: 13 U/L (ref 0–44)
AST: 18 U/L (ref 15–41)
Albumin: 3.6 g/dL (ref 3.5–5.0)
Alkaline Phosphatase: 62 U/L (ref 38–126)
Anion gap: 8 (ref 5–15)
BUN: 7 mg/dL (ref 6–20)
CO2: 24 mmol/L (ref 22–32)
Calcium: 8.9 mg/dL (ref 8.9–10.3)
Chloride: 101 mmol/L (ref 98–111)
Creatinine, Ser: 0.74 mg/dL (ref 0.61–1.24)
GFR, Estimated: >60 mL/min (ref >60)
Glucose, Bld: 145 mg/dL — ABNORMAL HIGH (ref 70–99)
Potassium: 4.2 mmol/L (ref 3.5–5.1)
Sodium: 133 mmol/L — ABNORMAL LOW (ref 135–145)
Total Bilirubin: 0.3 mg/dL (ref 0.3–1.2)
Total Protein: 6.7 g/dL (ref 6.5–8.1)

## 2021-01-21 MED ORDER — SODIUM CHLORIDE 0.9 % IV SOLN
10.0000 mg/kg | Freq: Once | INTRAVENOUS | Status: AC
  Administered 2021-01-21: 860 mg via INTRAVENOUS
  Filled 2021-01-21: qty 7.2

## 2021-01-21 MED ORDER — SODIUM CHLORIDE 0.9 % IV SOLN
Freq: Once | INTRAVENOUS | Status: AC
  Filled 2021-01-21: qty 250

## 2021-01-21 NOTE — Progress Notes (Signed)
Pt stable at discharge.  

## 2021-01-22 ENCOUNTER — Encounter: Payer: Self-pay | Admitting: Pulmonary Disease

## 2021-01-22 NOTE — Progress Notes (Signed)
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 He had a chest x-ray followed by a CT scan which showed a 5.7 x 4.4 cm left upper lobe mass along with left hilar adenopathy. There was a low-density 3.6 lesion noted in the right hepatic lobe which ultimately turned out to be a hemangioma on MRI liver. PET CT scan showed hypermetabolic activity in the left upper lobe and left hilar nodal tissue. Subcarinal and right paratracheal lymph nodes with mild hypermetabolic features.  Patient underwent bronchoscopies and biopsies.Left upper lobe biopsy was consistent with non-small cell carcinoma favor squamous cell carcinoma right subcarinal lymph node biopsy was negative for malignancy.  Plan is for concurrent chemoradiation with carbotaxol followed by maintenance durvalumab. Patient does not want to get a port placed. He is also refused Covid vaccination  NGS testing showed high tumor mutational burden. PD-L1 90%. MSI stable. Patient PIK3CAand notch2FCHO2 fusion, T p53   Interval history-patient reports having generalized itching for the last few days. He has not changed his soaps or lotions recently.  ECOG PS- 1 Pain scale- 0 Opioid associated constipation- no  Review of systems- Review of Systems   Constitutional: Positive for malaise/fatigue. Negative for chills, fever and weight loss.  HENT: Negative for congestion, ear discharge and nosebleeds.   Eyes: Negative for blurred vision.  Respiratory: Negative for cough, hemoptysis, sputum production, shortness of breath and wheezing.   Cardiovascular: Negative for chest pain, palpitations, orthopnea and claudication.  Gastrointestinal: Negative for abdominal pain, blood in stool, constipation, diarrhea, heartburn, melena, nausea and vomiting.  Genitourinary: Negative for dysuria, flank pain, frequency, hematuria and urgency.  Musculoskeletal: Negative for back pain, joint pain and myalgias.  Skin: Positive for itching. Negative for rash.  Neurological: Negative for dizziness, tingling, focal weakness, seizures, weakness and headaches.  Endo/Heme/Allergies: Does not bruise/bleed easily.  Psychiatric/Behavioral: Negative for depression and suicidal ideas. The patient does not have insomnia.      No Known Allergies   Past Medical History:  Diagnosis Date  . COPD (chronic obstructive pulmonary disease) ([place])   . Emphysema of lung ([place])   . Lung cancer ([place]) 06/2020  . Pneumonia      Past Surgical History:  Procedure Laterality Date  . BACK SURGERY  2017   lumbar region herniated disc  . VIDEO BRONCHOSCOPY WITH ENDOBRONCHIAL NAVIGATION N/A 08/07/2020   Procedure: VIDEO BRONCHOSCOPY WITH ENDOBRONCHIAL NAVIGATION;  Surgeon: Tyler Pita, MD;  Location: [facility] ORS;  Service: Pulmonary;  Laterality: N/A;  . VIDEO BRONCHOSCOPY WITH ENDOBRONCHIAL ULTRASOUND N/A 08/07/2020   Procedure: VIDEO BRONCHOSCOPY WITH ENDOBRONCHIAL ULTRASOUND;  Surgeon: Tyler Pita, MD;  Location: [facility] ORS;  Service: Pulmonary;  Laterality: N/A;    Social History   Socioeconomic History  . Marital status: Divorced    Spouse name: Not on file  . Number of children: Not  on file  . Years of education: Not on file  . Highest education level: Not on  file  Occupational History  . Not on file  Tobacco Use  . Smoking status: Current Every Day Smoker    Packs/day: 3.00    Years: 35.00    Pack years: 105.00    Types: Cigarettes  . Smokeless tobacco: Former Systems developer    Types: Chew  . Tobacco comment: 2PPD 01/19/2021  Vaping Use  . Vaping Use: Never used  Substance and Sexual Activity  . Alcohol use: Yes    Alcohol/week: 8.0 standard drinks    Types: 8 Cans of beer per week  . Drug use: No  . Sexual activity: Yes  Other Topics Concern  . Not on file  Social History Narrative  . Not on file   Social Determinants of Health   Financial Resource Strain: Not on file  Food Insecurity: Not on file  Transportation Needs: Not on file  Physical Activity: Not on file  Stress: Not on file  Social Connections: Not on file  Intimate Partner Violence: Not on file    Family History  Problem Relation Age of Onset  . Diabetes Father   . Lung cancer Father 21  . Heart attack Father   . Throat cancer Maternal Aunt      Current Outpatient Medications:  .  acetaminophen (TYLENOL) 500 MG tablet, Take 500-1,000 mg by mouth every 6 (six) hours as needed (for pain.)., Disp: , Rfl:  .  albuterol (VENTOLIN HFA) 108 (90 Base) MCG/ACT inhaler, Inhale 2 puffs into the lungs every 6 (six) hours as needed for wheezing or shortness of breath., Disp: 8 g, Rfl: 2 .  Budeson-Glycopyrrol-Formoterol (BREZTRI AEROSPHERE) 160-9-4.8 MCG/ACT AERO, Inhale 2 puffs into the lungs in the morning and at bedtime., Disp: 10.7 g, Rfl: 0 .  [START ON 01/27/2021] Budeson-Glycopyrrol-Formoterol (BREZTRI AEROSPHERE) 160-9-4.8 MCG/ACT AERO, Inhale 2 puffs into the lungs in the morning and at bedtime., Disp: 10.7 g, Rfl: 11 .  oxyCODONE (OXY IR/ROXICODONE) 5 MG immediate release tablet, Take 1 tablet (5 mg total) by mouth every 4 (four) hours as needed for severe pain., Disp: 120 tablet, Rfl: 0 .  sucralfate (CARAFATE) 1 g tablet, Take 1 tablet (1 g total) by mouth 3 (three) times  daily. Dissolve in 3-4 tbsp warm water, swish and swallow., Disp: 90 tablet, Rfl: 1  Physical exam:  Vitals:   01/21/21 0939  BP: 127/78  Pulse: 72  Resp: 16  Temp: (!) 96 F (35.6 C)  TempSrc: Tympanic  SpO2: 100%  Weight: 184 lb 4.8 oz (83.6 kg)   Physical Exam Eyes:     Extraocular Movements: EOM normal.  Cardiovascular:     Rate and Rhythm: Normal rate and regular rhythm.     Heart sounds: Normal heart sounds.  Pulmonary:     Effort: Pulmonary effort is normal.     Breath sounds: Normal breath sounds.  Abdominal:     General: Bowel sounds are normal.     Palpations: Abdomen is soft.  Skin:    Comments: Marks from itching are noted mainly over the lower extremities and trunk without a distinct rash  Neurological:     Mental Status: He is alert and oriented to person, place, and time.      CMP Latest Ref Rng & Units 01/21/2021  Glucose 70 - 99 mg/dL 145(H)  BUN 6 - 20 mg/dL 7  Creatinine 0.61 - 1.24 mg/dL 0.74  Sodium 135 -  145 mmol/L 133(L)  Potassium 3.5 - 5.1 mmol/L 4.2  Chloride 98 - 111 mmol/L 101  CO2 22 - 32 mmol/L 24  Calcium 8.9 - 10.3 mg/dL 8.9  Total Protein 6.5 - 8.1 g/dL 6.7  Total Bilirubin 0.3 - 1.2 mg/dL 0.3  Alkaline Phos 38 - 126 U/L 62  AST 15 - 41 U/L 18  ALT 0 - 44 U/L 13   CBC Latest Ref Rng & Units 01/21/2021  WBC 4.0 - 10.5 K/uL 7.5  Hemoglobin 13.0 - 17.0 g/dL 13.9  Hematocrit 39.0 - 52.0 % 40.5  Platelets 150 - 400 K/uL 219     Assessment and plan- Patient is a 55 y.o. male withh/ostage III squamous cell carcinoma of the left upper lobe T3 N1 M0. He is here for on treatment assessment prior to cycle 4 of maintenance durvalumab  Counts okay to proceed with cycle 4 of maintenance durvalumab today. I will see him back in 2 weeks for cycle 5.  Pruritus: Etiology unclear. No recent change in soaps or lotions. Of asked him to take loratadine as needed basis to see if it would help his itching. Reassess symptoms in 2 weeks. It does not  appear to be directly related to durvalumab   Visit Diagnosis 1. Encounter for antineoplastic immunotherapy   2. Malignant neoplasm of unspecified part of unspecified bronchus or lung ([place])   3. Pruritus      Dr. Randa Evens, MD, MPH [facility] [phone] 01/22/2021 12:23 PM

## 2021-02-04 ENCOUNTER — Other Ambulatory Visit: Payer: Self-pay | Admitting: *Deleted

## 2021-02-04 ENCOUNTER — Inpatient Hospital Stay: Payer: Medicaid Other

## 2021-02-04 ENCOUNTER — Inpatient Hospital Stay: Payer: Medicaid Other | Attending: Oncology | Admitting: Oncology

## 2021-02-04 ENCOUNTER — Encounter: Payer: Self-pay | Admitting: Oncology

## 2021-02-04 VITALS — BP 116/77 | HR 72 | Temp 98.1°F | Resp 16 | Wt 182.4 lb

## 2021-02-04 DIAGNOSIS — Z5112 Encounter for antineoplastic immunotherapy: Secondary | ICD-10-CM | POA: Diagnosis not present

## 2021-02-04 DIAGNOSIS — R0789 Other chest pain: Secondary | ICD-10-CM | POA: Insufficient documentation

## 2021-02-04 DIAGNOSIS — Z8249 Family history of ischemic heart disease and other diseases of the circulatory system: Secondary | ICD-10-CM | POA: Insufficient documentation

## 2021-02-04 DIAGNOSIS — R7989 Other specified abnormal findings of blood chemistry: Secondary | ICD-10-CM

## 2021-02-04 DIAGNOSIS — C3490 Malignant neoplasm of unspecified part of unspecified bronchus or lung: Secondary | ICD-10-CM

## 2021-02-04 DIAGNOSIS — R590 Localized enlarged lymph nodes: Secondary | ICD-10-CM | POA: Diagnosis not present

## 2021-02-04 DIAGNOSIS — J431 Panlobular emphysema: Secondary | ICD-10-CM | POA: Insufficient documentation

## 2021-02-04 DIAGNOSIS — L299 Pruritus, unspecified: Secondary | ICD-10-CM | POA: Diagnosis not present

## 2021-02-04 DIAGNOSIS — J439 Emphysema, unspecified: Secondary | ICD-10-CM | POA: Diagnosis not present

## 2021-02-04 DIAGNOSIS — Z79899 Other long term (current) drug therapy: Secondary | ICD-10-CM | POA: Insufficient documentation

## 2021-02-04 DIAGNOSIS — C3412 Malignant neoplasm of upper lobe, left bronchus or lung: Secondary | ICD-10-CM | POA: Diagnosis present

## 2021-02-04 DIAGNOSIS — G893 Neoplasm related pain (acute) (chronic): Secondary | ICD-10-CM | POA: Insufficient documentation

## 2021-02-04 DIAGNOSIS — I700 Atherosclerosis of aorta: Secondary | ICD-10-CM | POA: Insufficient documentation

## 2021-02-04 DIAGNOSIS — Z801 Family history of malignant neoplasm of trachea, bronchus and lung: Secondary | ICD-10-CM | POA: Insufficient documentation

## 2021-02-04 DIAGNOSIS — D1803 Hemangioma of intra-abdominal structures: Secondary | ICD-10-CM | POA: Diagnosis not present

## 2021-02-04 DIAGNOSIS — Z833 Family history of diabetes mellitus: Secondary | ICD-10-CM | POA: Diagnosis not present

## 2021-02-04 DIAGNOSIS — F17210 Nicotine dependence, cigarettes, uncomplicated: Secondary | ICD-10-CM | POA: Insufficient documentation

## 2021-02-04 DIAGNOSIS — Z7289 Other problems related to lifestyle: Secondary | ICD-10-CM | POA: Insufficient documentation

## 2021-02-04 LAB — COMPREHENSIVE METABOLIC PANEL
ALT: 13 U/L (ref 0–44)
AST: 19 U/L (ref 15–41)
Albumin: 3.6 g/dL (ref 3.5–5.0)
Alkaline Phosphatase: 59 U/L (ref 38–126)
Anion gap: 9 (ref 5–15)
BUN: 7 mg/dL (ref 6–20)
CO2: 24 mmol/L (ref 22–32)
Calcium: 9 mg/dL (ref 8.9–10.3)
Chloride: 101 mmol/L (ref 98–111)
Creatinine, Ser: 0.78 mg/dL (ref 0.61–1.24)
GFR, Estimated: >60 mL/min (ref >60)
Glucose, Bld: 152 mg/dL — ABNORMAL HIGH (ref 70–99)
Potassium: 4.3 mmol/L (ref 3.5–5.1)
Sodium: 134 mmol/L — ABNORMAL LOW (ref 135–145)
Total Bilirubin: 0.7 mg/dL (ref 0.3–1.2)
Total Protein: 6.6 g/dL (ref 6.5–8.1)

## 2021-02-04 LAB — CBC WITH DIFFERENTIAL/PLATELET
Abs Immature Granulocytes: 0.02 10*3/uL (ref 0.00–0.07)
Basophils Absolute: 0.1 10*3/uL (ref 0.0–0.1)
Basophils Relative: 1 %
Eosinophils Absolute: 0.4 10*3/uL (ref 0.0–0.5)
Eosinophils Relative: 5 %
HCT: 40 % (ref 39.0–52.0)
Hemoglobin: 13.9 g/dL (ref 13.0–17.0)
Immature Granulocytes: 0 %
Lymphocytes Relative: 16 %
Lymphs Abs: 1.1 10*3/uL (ref 0.7–4.0)
MCH: 31.4 pg (ref 26.0–34.0)
MCHC: 34.8 g/dL (ref 30.0–36.0)
MCV: 90.5 fL (ref 80.0–100.0)
Monocytes Absolute: 0.7 10*3/uL (ref 0.1–1.0)
Monocytes Relative: 11 %
Neutro Abs: 4.4 10*3/uL (ref 1.7–7.7)
Neutrophils Relative %: 67 %
Platelets: 235 10*3/uL (ref 150–400)
RBC: 4.42 MIL/uL (ref 4.22–5.81)
RDW: 11.9 % (ref 11.5–15.5)
WBC: 6.6 10*3/uL (ref 4.0–10.5)
nRBC: 0 % (ref 0.0–0.2)

## 2021-02-04 LAB — TSH: TSH: 0.074 u[IU]/mL — ABNORMAL LOW (ref 0.350–4.500)

## 2021-02-04 MED ORDER — SODIUM CHLORIDE 0.9 % IV SOLN
10.0000 mg/kg | Freq: Once | INTRAVENOUS | Status: AC
  Administered 2021-02-04: 860 mg via INTRAVENOUS
  Filled 2021-02-04: qty 10

## 2021-02-04 MED ORDER — HEPARIN SOD (PORK) LOCK FLUSH 100 UNIT/ML IV SOLN
500.0000 [IU] | Freq: Once | INTRAVENOUS | Status: DC | PRN
  Filled 2021-02-04: qty 5

## 2021-02-04 MED ORDER — SODIUM CHLORIDE 0.9 % IV SOLN
Freq: Once | INTRAVENOUS | Status: AC
  Filled 2021-02-04: qty 250

## 2021-02-04 MED ORDER — OXYCODONE HCL 5 MG PO TABS: 5.0000 mg | ORAL_TABLET | ORAL | 0 refills | Status: DC | PRN

## 2021-02-04 NOTE — Progress Notes (Signed)
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 He had a chest x-ray followed by a CT scan which showed a 5.7 x 4.4 cm left upper lobe mass along with left hilar adenopathy. There was a low-density 3.6 lesion noted in the right hepatic lobe which ultimately turned out to be a hemangioma on MRI liver. PET CT scan showed hypermetabolic activity in the left upper lobe and left hilar nodal tissue. Subcarinal and right paratracheal lymph nodes with mild hypermetabolic features.  Patient underwent bronchoscopies and biopsies.Left upper lobe biopsy was consistent with non-small cell carcinoma favor squamous cell carcinoma right subcarinal lymph node biopsy was negative for malignancy.  Plan is for concurrent chemoradiation with carbotaxol followed by maintenance durvalumab. Patient does not want to get a port placed. He is also refused Covid vaccination  NGS testing showed high tumor mutational burden. PD-L1 90%. MSI stable. Patient PIK3CAand notch2FCHO2 fusion, T p53   Interval history-patient reports occasional chest wall pain for which she takes as needed oxycodone which has been helping him.  Pruritus has decreased.  ECOG PS- 1 Pain scale- 0   Review of systems- Review of Systems  Constitutional:  Negative for chills, fever, malaise/fatigue and weight loss.  HENT: Negative for congestion, ear discharge and nosebleeds.   Eyes: Negative for blurred vision.  Respiratory: Negative for cough, hemoptysis, sputum production, shortness of breath and wheezing.        Chest wall pain  Cardiovascular: Negative for chest pain, palpitations, orthopnea and claudication.  Gastrointestinal: Negative for abdominal pain, blood in stool, constipation, diarrhea, heartburn, melena, nausea and vomiting.  Genitourinary: Negative for dysuria, flank pain, frequency, hematuria and urgency.  Musculoskeletal: Negative for back pain, joint pain and myalgias.  Skin: Negative for rash.  Neurological: Negative for dizziness, tingling, focal weakness, seizures, weakness and headaches.  Endo/Heme/Allergies: Does not bruise/bleed easily.  Psychiatric/Behavioral: Negative for depression and suicidal ideas. The patient does not have insomnia.        No Known Allergies   Past Medical History:  Diagnosis Date  . COPD (chronic obstructive pulmonary disease) ([place])   . Emphysema of lung ([place])   . Lung cancer ([place]) 06/2020  . Pneumonia      Past Surgical History:  Procedure Laterality Date  . BACK SURGERY  2017   lumbar region herniated disc  . VIDEO BRONCHOSCOPY WITH ENDOBRONCHIAL NAVIGATION N/A 08/07/2020   Procedure: VIDEO BRONCHOSCOPY WITH ENDOBRONCHIAL NAVIGATION;  Surgeon: Tyler Pita, MD;  Location: [facility] ORS;  Service: Pulmonary;  Laterality: N/A;  . VIDEO BRONCHOSCOPY WITH ENDOBRONCHIAL ULTRASOUND N/A 08/07/2020   Procedure: VIDEO BRONCHOSCOPY WITH ENDOBRONCHIAL ULTRASOUND;  Surgeon: Tyler Pita, MD;  Location: [facility] ORS;  Service: Pulmonary;  Laterality: N/A;    Social History   Socioeconomic History  . Marital status: Divorced    Spouse name: Not on file  .  Number of children: Not on file  . Years of education: Not on file  . Highest education level: Not on file  Occupational History   . Not on file  Tobacco Use  . Smoking status: Current Every Day Smoker    Packs/day: 3.00    Years: 35.00    Pack years: 105.00    Types: Cigarettes  . Smokeless tobacco: Former Systems developer    Types: Chew  . Tobacco comment: 2PPD 01/19/2021  Vaping Use  . Vaping Use: Never used  Substance and Sexual Activity  . Alcohol use: Yes    Alcohol/week: 8.0 standard drinks    Types: 8 Cans of beer per week  . Drug use: No  . Sexual activity: Yes  Other Topics Concern  . Not on file  Social History Narrative  . Not on file   Social Determinants of Health   Financial Resource Strain: Not on file  Food Insecurity: Not on file  Transportation Needs: Not on file  Physical Activity: Not on file  Stress: Not on file  Social Connections: Not on file  Intimate Partner Violence: Not on file    Family History  Problem Relation Age of Onset  . Diabetes Father   . Lung cancer Father 71  . Heart attack Father   . Throat cancer Maternal Aunt      Current Outpatient Medications:  .  acetaminophen (TYLENOL) 500 MG tablet, Take 500-1,000 mg by mouth every 6 (six) hours as needed (for pain.)., Disp: , Rfl:  .  albuterol (VENTOLIN HFA) 108 (90 Base) MCG/ACT inhaler, Inhale 2 puffs into the lungs every 6 (six) hours as needed for wheezing or shortness of breath., Disp: 8 g, Rfl: 2 .  Budeson-Glycopyrrol-Formoterol (BREZTRI AEROSPHERE) 160-9-4.8 MCG/ACT AERO, Inhale 2 puffs into the lungs in the morning and at bedtime., Disp: 10.7 g, Rfl: 0 .  Budeson-Glycopyrrol-Formoterol (BREZTRI AEROSPHERE) 160-9-4.8 MCG/ACT AERO, Inhale 2 puffs into the lungs in the morning and at bedtime., Disp: 10.7 g, Rfl: 11 .  sucralfate (CARAFATE) 1 g tablet, Take 1 tablet (1 g total) by mouth 3 (three) times daily. Dissolve in 3-4 tbsp warm water, swish and swallow., Disp: 90 tablet, Rfl: 1 .  oxyCODONE (OXY IR/ROXICODONE) 5 MG immediate release tablet, Take 1 tablet (5 mg total) by mouth every 4 (four) hours as needed for  severe pain., Disp: 120 tablet, Rfl: 0 No current facility-administered medications for this visit.  Facility-Administered Medications Ordered in Other Visits:  .  heparin lock flush 100 unit/mL, 500 Units, Intracatheter, Once PRN, Sindy Guadeloupe, MD  Physical exam:  Vitals:   02/04/21 0939  BP: 116/77  Pulse: 72  Resp: 16  Temp: 98.1 F (36.7 C)  TempSrc: Oral  Weight: 182 lb 6.4 oz (82.7 kg)   Physical Exam Constitutional:      General: He is not in acute distress. Eyes:     Extraocular Movements: EOM normal.  Cardiovascular:     Rate and Rhythm: Normal rate and regular rhythm.     Heart sounds: Normal heart sounds.  Pulmonary:     Effort: Pulmonary effort is normal.     Breath sounds: Normal breath sounds.  Abdominal:     General: Bowel sounds are normal.     Palpations: Abdomen is soft.  Skin:    General: Skin is warm and dry.  Neurological:     Mental Status: He is alert and oriented to person, place, and time.      CMP Latest  Ref Rng & Units 02/04/2021  Glucose 70 - 99 mg/dL 152(H)  BUN 6 - 20 mg/dL 7  Creatinine 0.61 - 1.24 mg/dL 0.78  Sodium 135 - 145 mmol/L 134(L)  Potassium 3.5 - 5.1 mmol/L 4.3  Chloride 98 - 111 mmol/L 101  CO2 22 - 32 mmol/L 24  Calcium 8.9 - 10.3 mg/dL 9.0  Total Protein 6.5 - 8.1 g/dL 6.6  Total Bilirubin 0.3 - 1.2 mg/dL 0.7  Alkaline Phos 38 - 126 U/L 59  AST 15 - 41 U/L 19  ALT 0 - 44 U/L 13   CBC Latest Ref Rng & Units 02/04/2021  WBC 4.0 - 10.5 K/uL 6.6  Hemoglobin 13.0 - 17.0 g/dL 13.9  Hematocrit 39.0 - 52.0 % 40.0  Platelets 150 - 400 K/uL 235      Assessment and plan- Patient is a 55 y.o. male withh/ostage III squamous cell carcinoma of the left upper lobe T3 N1 M0.  He is here for on treatment assessment prior to cycle 5 of maintenance durvalumab  Counts okay to proceed with cycle 5 of maintenance durvalumab today.  I will see him back in 2 weeks for cycle 6.  Overall tolerating well without any significant side  effects.  TSH today was low at 0.074.  Plan to repeat it again in 3 weeks with a T4 as well  Neoplasm related pain: Continue as needed oxycodone   Visit Diagnosis 1. Encounter for antineoplastic immunotherapy   2. Malignant neoplasm of unspecified part of unspecified bronchus or lung ([place])   3. Neoplasm related pain      Dr. Randa Evens, MD, MPH [facility] [phone] 02/04/2021 1:39 PM

## 2021-02-04 NOTE — Progress Notes (Signed)
Patient here for follow up he states that he has been using pain medication to relieve the pain in his chest and needs a refill.

## 2021-02-12 ENCOUNTER — Other Ambulatory Visit: Payer: Self-pay

## 2021-02-12 ENCOUNTER — Ambulatory Visit
Admission: RE | Admit: 2021-02-12 | Discharge: 2021-02-12 | Disposition: A | Discharge status: Home / Self Care | Payer: Medicaid Other | Source: Ambulatory Visit | Attending: Oncology | Admitting: Oncology

## 2021-02-12 DIAGNOSIS — I700 Atherosclerosis of aorta: Secondary | ICD-10-CM | POA: Diagnosis not present

## 2021-02-12 DIAGNOSIS — Z5112 Encounter for antineoplastic immunotherapy: Secondary | ICD-10-CM | POA: Diagnosis not present

## 2021-02-12 DIAGNOSIS — J438 Other emphysema: Secondary | ICD-10-CM | POA: Diagnosis not present

## 2021-02-12 DIAGNOSIS — I2510 Atherosclerotic heart disease of native coronary artery without angina pectoris: Secondary | ICD-10-CM | POA: Diagnosis not present

## 2021-02-12 DIAGNOSIS — C3490 Malignant neoplasm of unspecified part of unspecified bronchus or lung: Secondary | ICD-10-CM | POA: Insufficient documentation

## 2021-02-12 DIAGNOSIS — C3412 Malignant neoplasm of upper lobe, left bronchus or lung: Secondary | ICD-10-CM | POA: Diagnosis not present

## 2021-02-12 DIAGNOSIS — D1803 Hemangioma of intra-abdominal structures: Secondary | ICD-10-CM | POA: Diagnosis not present

## 2021-02-12 DIAGNOSIS — J984 Other disorders of lung: Secondary | ICD-10-CM | POA: Diagnosis not present

## 2021-02-12 MED ORDER — IOHEXOL 300 MG/ML  SOLN
100.0000 mL | Freq: Once | INTRAMUSCULAR | Status: AC | PRN
  Administered 2021-02-12: 100 mL via INTRAVENOUS

## 2021-02-18 ENCOUNTER — Inpatient Hospital Stay (HOSPITAL_BASED_OUTPATIENT_CLINIC_OR_DEPARTMENT_OTHER): Payer: Medicaid Other | Admitting: Oncology

## 2021-02-18 ENCOUNTER — Inpatient Hospital Stay: Payer: Medicaid Other

## 2021-02-18 ENCOUNTER — Encounter: Payer: Self-pay | Admitting: Oncology

## 2021-02-18 VITALS — BP 111/68 | HR 74 | Temp 96.3°F | Resp 20 | Wt 185.6 lb

## 2021-02-18 DIAGNOSIS — C3490 Malignant neoplasm of unspecified part of unspecified bronchus or lung: Secondary | ICD-10-CM

## 2021-02-18 DIAGNOSIS — R7989 Other specified abnormal findings of blood chemistry: Secondary | ICD-10-CM

## 2021-02-18 DIAGNOSIS — Z5112 Encounter for antineoplastic immunotherapy: Secondary | ICD-10-CM | POA: Diagnosis not present

## 2021-02-18 DIAGNOSIS — G893 Neoplasm related pain (acute) (chronic): Secondary | ICD-10-CM | POA: Diagnosis not present

## 2021-02-18 LAB — COMPREHENSIVE METABOLIC PANEL
ALT: 13 U/L (ref 0–44)
AST: 15 U/L (ref 15–41)
Albumin: 3.9 g/dL (ref 3.5–5.0)
Alkaline Phosphatase: 54 U/L (ref 38–126)
Anion gap: 9 (ref 5–15)
BUN: 13 mg/dL (ref 6–20)
CO2: 26 mmol/L (ref 22–32)
Calcium: 9.1 mg/dL (ref 8.9–10.3)
Chloride: 102 mmol/L (ref 98–111)
Creatinine, Ser: 0.82 mg/dL (ref 0.61–1.24)
GFR, Estimated: >60 mL/min (ref >60)
Glucose, Bld: 67 mg/dL — ABNORMAL LOW (ref 70–99)
Potassium: 4.5 mmol/L (ref 3.5–5.1)
Sodium: 137 mmol/L (ref 135–145)
Total Bilirubin: 0.5 mg/dL (ref 0.3–1.2)
Total Protein: 7.2 g/dL (ref 6.5–8.1)

## 2021-02-18 LAB — CBC WITH DIFFERENTIAL/PLATELET
Abs Immature Granulocytes: 0.03 10*3/uL (ref 0.00–0.07)
Basophils Absolute: 0.1 10*3/uL (ref 0.0–0.1)
Basophils Relative: 1 %
Eosinophils Absolute: 0.4 10*3/uL (ref 0.0–0.5)
Eosinophils Relative: 6 %
HCT: 44.2 % (ref 39.0–52.0)
Hemoglobin: 14.7 g/dL (ref 13.0–17.0)
Immature Granulocytes: 1 %
Lymphocytes Relative: 16 %
Lymphs Abs: 1 10*3/uL (ref 0.7–4.0)
MCH: 30.7 pg (ref 26.0–34.0)
MCHC: 33.3 g/dL (ref 30.0–36.0)
MCV: 92.3 fL (ref 80.0–100.0)
Monocytes Absolute: 0.7 10*3/uL (ref 0.1–1.0)
Monocytes Relative: 11 %
Neutro Abs: 4.4 10*3/uL (ref 1.7–7.7)
Neutrophils Relative %: 65 %
Platelets: 255 10*3/uL (ref 150–400)
RBC: 4.79 MIL/uL (ref 4.22–5.81)
RDW: 11.9 % (ref 11.5–15.5)
WBC: 6.6 10*3/uL (ref 4.0–10.5)
nRBC: 0 % (ref 0.0–0.2)

## 2021-02-18 LAB — T4, FREE: Free T4: 0.68 ng/dL (ref 0.61–1.12)

## 2021-02-18 LAB — TSH: TSH: 0.804 u[IU]/mL (ref 0.350–4.500)

## 2021-02-18 MED ORDER — SODIUM CHLORIDE 0.9 % IV SOLN
10.0000 mg/kg | Freq: Once | INTRAVENOUS | Status: AC
  Administered 2021-02-18: 860 mg via INTRAVENOUS
  Filled 2021-02-18: qty 10

## 2021-02-18 MED ORDER — SODIUM CHLORIDE 0.9 % IV SOLN
Freq: Once | INTRAVENOUS | Status: AC
  Filled 2021-02-18: qty 250

## 2021-02-18 NOTE — Progress Notes (Signed)
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 He had a chest x-ray followed by a CT scan which showed a 5.7 x 4.4 cm left upper lobe mass along with left hilar adenopathy. There was a low-density 3.6 lesion noted in the right hepatic lobe which ultimately turned out to be a hemangioma on MRI liver. PET CT scan showed hypermetabolic activity in the left upper lobe and left hilar nodal tissue. Subcarinal and right paratracheal lymph nodes with mild hypermetabolic features.  Patient underwent bronchoscopies and biopsies.Left upper lobe biopsy was consistent with non-small cell carcinoma favor squamous cell carcinoma right subcarinal lymph node biopsy was negative for malignancy.  Plan is for concurrent chemoradiation with carbotaxol followed by maintenance durvalumab. Patient does not want to get a port placed. He is also refused Covid vaccination  NGS testing showed high tumor mutational burden. PD-L1 90%. MSI stable. Patient PIK3CAand notch2FCHO2 fusion, T p53   Interval history-reports intermittent left chest wall pain.  Breathing is overall at his baseline.  Denies any new complaints  ECOG PS- 1 Pain scale- 3 Opioid associated constipation- no  Review of systems- Review of Systems   Constitutional: Negative for chills, fever, malaise/fatigue and weight loss.  HENT: Negative for congestion, ear discharge and nosebleeds.   Eyes: Negative for blurred vision.  Respiratory: Negative for cough, hemoptysis, sputum production, shortness of breath and wheezing.        Left chest wall pain  Cardiovascular: Negative for chest pain, palpitations, orthopnea and claudication.  Gastrointestinal: Negative for abdominal pain, blood in stool, constipation, diarrhea, heartburn, melena, nausea and vomiting.  Genitourinary: Negative for dysuria, flank pain, frequency, hematuria and urgency.  Musculoskeletal: Negative for back pain, joint pain and myalgias.  Skin: Negative for rash.  Neurological: Negative for dizziness, tingling, focal weakness, seizures, weakness and headaches.  Endo/Heme/Allergies: Does not bruise/bleed easily.  Psychiatric/Behavioral: Negative for depression and suicidal ideas. The patient does not have insomnia.        No Known Allergies   Past Medical History:  Diagnosis Date  . COPD (chronic obstructive pulmonary disease) ([place])   . Emphysema of lung ([place])   . Lung cancer ([place]) 06/2020  . Pneumonia      Past Surgical History:  Procedure Laterality Date  . BACK SURGERY  2017   lumbar region herniated disc  . VIDEO BRONCHOSCOPY WITH ENDOBRONCHIAL NAVIGATION N/A 08/07/2020   Procedure: VIDEO BRONCHOSCOPY WITH ENDOBRONCHIAL NAVIGATION;  Surgeon: Tyler Pita, MD;  Location: [facility] ORS;  Service: Pulmonary;  Laterality: N/A;  . VIDEO BRONCHOSCOPY WITH ENDOBRONCHIAL ULTRASOUND N/A 08/07/2020   Procedure: VIDEO BRONCHOSCOPY WITH ENDOBRONCHIAL ULTRASOUND;  Surgeon: Tyler Pita, MD;  Location: [facility] ORS;  Service: Pulmonary;  Laterality: N/A;    Social History   Socioeconomic History  . Marital status: Divorced    Spouse name: Not on file  .  Number of children: Not on file  . Years of education: Not on file  . Highest education level: Not on file   Occupational History  . Not on file  Tobacco Use  . Smoking status: Current Every Day Smoker    Packs/day: 3.00    Years: 35.00    Pack years: 105.00    Types: Cigarettes  . Smokeless tobacco: Former Systems developer    Types: Chew  . Tobacco comment: 2PPD 01/19/2021  Vaping Use  . Vaping Use: Never used  Substance and Sexual Activity  . Alcohol use: Yes    Alcohol/week: 8.0 standard drinks    Types: 8 Cans of beer per week  . Drug use: No  . Sexual activity: Yes  Other Topics Concern  . Not on file  Social History Narrative  . Not on file   Social Determinants of Health   Financial Resource Strain: Not on file  Food Insecurity: Not on file  Transportation Needs: Not on file  Physical Activity: Not on file  Stress: Not on file  Social Connections: Not on file  Intimate Partner Violence: Not on file    Family History  Problem Relation Age of Onset  . Diabetes Father   . Lung cancer Father 3  . Heart attack Father   . Throat cancer Maternal Aunt      Current Outpatient Medications:  .  acetaminophen (TYLENOL) 500 MG tablet, Take 500-1,000 mg by mouth every 6 (six) hours as needed (for pain.)., Disp: , Rfl:  .  albuterol (VENTOLIN HFA) 108 (90 Base) MCG/ACT inhaler, Inhale 2 puffs into the lungs every 6 (six) hours as needed for wheezing or shortness of breath., Disp: 8 g, Rfl: 2 .  Budeson-Glycopyrrol-Formoterol (BREZTRI AEROSPHERE) 160-9-4.8 MCG/ACT AERO, Inhale 2 puffs into the lungs in the morning and at bedtime., Disp: 10.7 g, Rfl: 0 .  Budeson-Glycopyrrol-Formoterol (BREZTRI AEROSPHERE) 160-9-4.8 MCG/ACT AERO, Inhale 2 puffs into the lungs in the morning and at bedtime., Disp: 10.7 g, Rfl: 11 .  oxyCODONE (OXY IR/ROXICODONE) 5 MG immediate release tablet, Take 1 tablet (5 mg total) by mouth every 4 (four) hours as needed for severe pain., Disp: 120 tablet, Rfl: 0 .  sucralfate (CARAFATE) 1 g tablet, Take 1 tablet (1 g total) by mouth 3 (three) times daily. Dissolve in 3-4  tbsp warm water, swish and swallow., Disp: 90 tablet, Rfl: 1  Physical exam: There were no vitals filed for this visit. Physical Exam Eyes:     Extraocular Movements: EOM normal.  Cardiovascular:     Rate and Rhythm: Normal rate and regular rhythm.     Heart sounds: Normal heart sounds.  Pulmonary:     Effort: Pulmonary effort is normal.     Breath sounds: Normal breath sounds.  Abdominal:     General: Bowel sounds are normal.     Palpations: Abdomen is soft.  Skin:    General: Skin is warm and dry.  Neurological:     Mental Status: He is alert and oriented to person, place, and time.      CMP Latest Ref Rng & Units 02/04/2021  Glucose 70 - 99 mg/dL 152(H)  BUN 6 - 20 mg/dL 7  Creatinine 0.61 - 1.24 mg/dL 0.78  Sodium 135 - 145 mmol/L 134(L)  Potassium 3.5 - 5.1 mmol/L 4.3  Chloride 98 - 111 mmol/L 101  CO2 22 - 32 mmol/L 24  Calcium 8.9 - 10.3 mg/dL 9.0  Total Protein 6.5 - 8.1 g/dL 6.6  Total Bilirubin 0.3 - 1.2 mg/dL 0.7  Alkaline Phos 38 - 126 U/L 59  AST 15 - 41 U/L 19  ALT 0 - 44 U/L 13   CBC Latest Ref Rng & Units 02/18/2021  WBC 4.0 - 10.5 K/uL 6.6  Hemoglobin 13.0 - 17.0 g/dL 14.7  Hematocrit 39.0 - 52.0 % 44.2  Platelets 150 - 400 K/uL 255    No images are attached to the encounter.  CT CHEST ABDOMEN PELVIS W CONTRAST  Result Date: 02/12/2021 CLINICAL DATA:  Left upper lobe lung cancer. Restaging post treatment. EXAM: CT CHEST, ABDOMEN, AND PELVIS WITH CONTRAST TECHNIQUE: Multidetector CT imaging of the chest, abdomen and pelvis was performed following the standard protocol during bolus administration of intravenous contrast. CONTRAST:  145m OMNIPAQUE IOHEXOL 300 MG/ML  SOLN COMPARISON:  Prior CTs 11/09/2020 and PET-CT 08/11/2020. FINDINGS: CT CHEST FINDINGS Cardiovascular: Mild atherosclerosis of the aorta and coronary arteries. No evidence of acute pulmonary embolism or other acute chest findings. The heart size is normal. There is no pericardial effusion.  Mediastinum/Nodes: There are no enlarged mediastinal, hilar or axillary lymph nodes. Small mediastinal and left hilar lymph nodes are unchanged. The thyroid gland, esophagus and trachea demonstrate no significant findings. Lungs/Pleura: No pleural effusion or pneumothorax. Moderate to severe panlobular emphysema again noted. The dominant cavitary cystic mass in the left upper lobe has decreased in size, now measuring 3.1 x 2.4 cm on image 43/4. This previously measured 4.3 x 3.5 cm. This remains thick-walled and contains increased central fluid. There is a new thick walled cavitary lesion inferiorly in the right upper lobe measuring 9 x 7 mm on image 67/4, suspicious for metachronous primary lung cancer, or less likely a cystic metastasis. 5 mm right middle lobe nodule on image 99/4 and 5 mm left lower lobe nodule on image 132/4 are unchanged. However, there are other possible new tiny nodules, most prominent within the lingula on image 53/4 and in the left lower lobe on image 59/4. Musculoskeletal/Chest wall: No chest wall mass or suspicious osseous findings. CT ABDOMEN AND PELVIS FINDINGS Hepatobiliary: The liver is normal in density without suspicious focal abnormality. Multiple (at least 4) hepatic hemangiomas are grossly stable. No evidence of gallstones, gallbladder wall thickening or biliary dilatation. Pancreas: Unremarkable. No pancreatic ductal dilatation or surrounding inflammatory changes. Spleen: Normal in size without focal abnormality. Adrenals/Urinary Tract: Both adrenal glands appear normal. The kidneys appear normal without evidence of urinary tract calculus, suspicious lesion or hydronephrosis. No bladder abnormalities are seen. Stomach/Bowel: The stomach appears unremarkable for its degree of distension. No evidence of bowel wall thickening, distention or surrounding inflammatory change. The appendix appears normal. There is moderate stool throughout the. Vascular/Lymphatic: There are no enlarged  abdominal or pelvic lymph nodes. Small inguinal lymph nodes are stable. Mild aortic and branch vessel atherosclerosis without acute vascular findings. Reproductive: The prostate gland appears unchanged. Other: No evidence of abdominal wall mass or hernia. No ascites. Musculoskeletal: No acute or significant osseous findings. Stable presumed sebaceous cyst in the subxiphoid anterior abdominal wall. IMPRESSION: 1. Interval decreased size of dominant cavitary cystic mass in the left upper lobe, consistent with response to therapy. 2. New thick walled cavitary lesion inferiorly in the right upper lobe, suspicious for metachronous primary lung cancer, or less likely a cystic metastasis. Possible additional new tiny nodules in the lingula and left lower lobe, potentially metastases. 3. No evidence of metastatic disease within the abdomen or pelvis. 4. Stable hepatic hemangiomas. 5. Aortic Atherosclerosis (ICD10-I70.0) and Emphysema (  ICD10-J43.9). Electronically Signed   By: Richardean Sale M.D.   On: 02/12/2021 11:27     Assessment and plan- Patient is a 55 y.o. male withh/ostage III squamous cell carcinoma of the left upper lobe T3 N1 M0.   He is here for on treatment assessment prior to cycle 6 of maintenance durvalumab  I have reviewed recent CT chest abdomen and pelvis with contrast images independently and discussed The findings with the patient.  Overall the size of the left upper lobe lung nodule is decreased in size from 4.3 cm to 3.1 cm.  There is a new cavitary lesion 9 mm lesion seen in the right upper lobe possibly cystic metastases.  There are also bilateral subcentimeter lung nodules seen and unclear of these represent new metastases versus nonspecific.  My plan is to continue durvalumab at this time since he only started immunotherapy about 2.5 months ago.  Plan to repeat scans in 3 months.  If there is a continued increase in the size of the nodules I will consider changing treatment or potential  rebiopsy.  Neoplasm related pain: Continue as needed oxycodone.  This likely explains her left chest wall pain as there was no other pathology identified on the recent CT scan   Visit Diagnosis 1. Encounter for antineoplastic immunotherapy   2. Malignant neoplasm of unspecified part of unspecified bronchus or lung ([place])   3. Neoplasm related pain      Dr. Randa Evens, MD, MPH [facility] [phone] 02/18/2021 10:03 AM

## 2021-02-19 ENCOUNTER — Ambulatory Visit: Payer: Medicaid Other | Admitting: Oncology

## 2021-02-19 ENCOUNTER — Ambulatory Visit: Payer: Medicaid Other

## 2021-02-19 ENCOUNTER — Other Ambulatory Visit: Payer: Medicaid Other

## 2021-02-23 ENCOUNTER — Telehealth: Payer: Self-pay | Admitting: Pulmonary Disease

## 2021-02-23 NOTE — Telephone Encounter (Signed)
PA for breztri stated via CMM Will await determination.  KEY: [facility]

## 2021-02-23 NOTE — Telephone Encounter (Signed)
Lm for patient.  

## 2021-02-23 NOTE — Telephone Encounter (Signed)
Received denial from [employer] for [facility]  Preferred medication are Advair, Dulera or symbicort.   Dr. Patsey Berthold, please advise. thanks

## 2021-02-23 NOTE — Telephone Encounter (Signed)
Unfortunately none of those have LAMA out if either Spiriva or Incruse are covered.  He will need TWO inhalers in that case and that would be Symbicort plus either Spiriva or Incruse added.

## 2021-02-24 MED ORDER — SPIRIVA RESPIMAT 2.5 MCG/ACT IN AERS: 2.0000 | INHALATION_SPRAY | Freq: Every day | RESPIRATORY_TRACT | 11 refills | Status: DC

## 2021-02-24 MED ORDER — BUDESONIDE-FORMOTEROL FUMARATE 160-4.5 MCG/ACT IN AERO: 2.0000 | INHALATION_SPRAY | Freq: Two times a day (BID) | RESPIRATORY_TRACT | 12 refills | Status: DC

## 2021-02-24 NOTE — Telephone Encounter (Signed)
Patient is aware of change in medications. He voiced his understanding.  Rx for Symbicort 160 and Spiriva 2.5 has been sent to preferred pharmacy. Verified dosage with Dr. Patsey Berthold verbally.  Nothing further needed at this time.

## 2021-03-04 ENCOUNTER — Inpatient Hospital Stay (HOSPITAL_BASED_OUTPATIENT_CLINIC_OR_DEPARTMENT_OTHER): Payer: Medicaid Other | Admitting: Oncology

## 2021-03-04 ENCOUNTER — Inpatient Hospital Stay: Payer: Medicaid Other | Attending: Oncology

## 2021-03-04 ENCOUNTER — Inpatient Hospital Stay: Payer: Medicaid Other

## 2021-03-04 ENCOUNTER — Encounter: Payer: Self-pay | Admitting: Oncology

## 2021-03-04 ENCOUNTER — Other Ambulatory Visit: Payer: Self-pay

## 2021-03-04 VITALS — BP 127/77 | HR 78 | Temp 98.3°F | Resp 16 | Ht 74.0 in | Wt 182.0 lb

## 2021-03-04 DIAGNOSIS — F17210 Nicotine dependence, cigarettes, uncomplicated: Secondary | ICD-10-CM | POA: Insufficient documentation

## 2021-03-04 DIAGNOSIS — Z79899 Other long term (current) drug therapy: Secondary | ICD-10-CM | POA: Insufficient documentation

## 2021-03-04 DIAGNOSIS — C3490 Malignant neoplasm of unspecified part of unspecified bronchus or lung: Secondary | ICD-10-CM

## 2021-03-04 DIAGNOSIS — G893 Neoplasm related pain (acute) (chronic): Secondary | ICD-10-CM | POA: Diagnosis not present

## 2021-03-04 DIAGNOSIS — C3412 Malignant neoplasm of upper lobe, left bronchus or lung: Secondary | ICD-10-CM | POA: Diagnosis present

## 2021-03-04 DIAGNOSIS — D1803 Hemangioma of intra-abdominal structures: Secondary | ICD-10-CM | POA: Insufficient documentation

## 2021-03-04 DIAGNOSIS — Z801 Family history of malignant neoplasm of trachea, bronchus and lung: Secondary | ICD-10-CM | POA: Diagnosis not present

## 2021-03-04 DIAGNOSIS — Z808 Family history of malignant neoplasm of other organs or systems: Secondary | ICD-10-CM | POA: Insufficient documentation

## 2021-03-04 DIAGNOSIS — R0789 Other chest pain: Secondary | ICD-10-CM | POA: Diagnosis not present

## 2021-03-04 DIAGNOSIS — J431 Panlobular emphysema: Secondary | ICD-10-CM | POA: Insufficient documentation

## 2021-03-04 DIAGNOSIS — Z8249 Family history of ischemic heart disease and other diseases of the circulatory system: Secondary | ICD-10-CM | POA: Insufficient documentation

## 2021-03-04 DIAGNOSIS — Z5112 Encounter for antineoplastic immunotherapy: Secondary | ICD-10-CM

## 2021-03-04 DIAGNOSIS — Z833 Family history of diabetes mellitus: Secondary | ICD-10-CM | POA: Insufficient documentation

## 2021-03-04 DIAGNOSIS — Z7289 Other problems related to lifestyle: Secondary | ICD-10-CM | POA: Diagnosis not present

## 2021-03-04 LAB — COMPREHENSIVE METABOLIC PANEL
ALT: 12 U/L (ref 0–44)
AST: 19 U/L (ref 15–41)
Albumin: 3.9 g/dL (ref 3.5–5.0)
Alkaline Phosphatase: 66 U/L (ref 38–126)
Anion gap: 8 (ref 5–15)
BUN: 7 mg/dL (ref 6–20)
CO2: 25 mmol/L (ref 22–32)
Calcium: 9.2 mg/dL (ref 8.9–10.3)
Chloride: 101 mmol/L (ref 98–111)
Creatinine, Ser: 0.87 mg/dL (ref 0.61–1.24)
GFR, Estimated: >60 mL/min (ref >60)
Glucose, Bld: 104 mg/dL — ABNORMAL HIGH (ref 70–99)
Potassium: 4.7 mmol/L (ref 3.5–5.1)
Sodium: 134 mmol/L — ABNORMAL LOW (ref 135–145)
Total Bilirubin: 0.6 mg/dL (ref 0.3–1.2)
Total Protein: 7.2 g/dL (ref 6.5–8.1)

## 2021-03-04 LAB — CBC WITH DIFFERENTIAL/PLATELET
Abs Immature Granulocytes: 0.05 10*3/uL (ref 0.00–0.07)
Basophils Absolute: 0.1 10*3/uL (ref 0.0–0.1)
Basophils Relative: 1 %
Eosinophils Absolute: 0.3 10*3/uL (ref 0.0–0.5)
Eosinophils Relative: 3 %
HCT: 44.2 % (ref 39.0–52.0)
Hemoglobin: 15 g/dL (ref 13.0–17.0)
Immature Granulocytes: 1 %
Lymphocytes Relative: 10 %
Lymphs Abs: 1.1 10*3/uL (ref 0.7–4.0)
MCH: 30.4 pg (ref 26.0–34.0)
MCHC: 33.9 g/dL (ref 30.0–36.0)
MCV: 89.5 fL (ref 80.0–100.0)
Monocytes Absolute: 0.9 10*3/uL (ref 0.1–1.0)
Monocytes Relative: 9 %
Neutro Abs: 8 10*3/uL — ABNORMAL HIGH (ref 1.7–7.7)
Neutrophils Relative %: 76 %
Platelets: 257 10*3/uL (ref 150–400)
RBC: 4.94 MIL/uL (ref 4.22–5.81)
RDW: 12.3 % (ref 11.5–15.5)
WBC: 10.4 10*3/uL (ref 4.0–10.5)
nRBC: 0 % (ref 0.0–0.2)

## 2021-03-04 MED ORDER — SODIUM CHLORIDE 0.9% FLUSH
10.0000 mL | INTRAVENOUS | Status: DC | PRN
  Filled 2021-03-04: qty 10

## 2021-03-04 MED ORDER — SODIUM CHLORIDE 0.9 % IV SOLN
10.0000 mg/kg | Freq: Once | INTRAVENOUS | Status: AC
  Administered 2021-03-04: 860 mg via INTRAVENOUS
  Filled 2021-03-04: qty 7.2

## 2021-03-04 MED ORDER — OXYCODONE HCL ER 10 MG PO T12A: 10.0000 mg | EXTENDED_RELEASE_TABLET | Freq: Two times a day (BID) | ORAL | 0 refills | Status: DC

## 2021-03-04 MED ORDER — SODIUM CHLORIDE 0.9 % IV SOLN
Freq: Once | INTRAVENOUS | Status: AC
  Filled 2021-03-04: qty 250

## 2021-03-04 MED ORDER — HEPARIN SOD (PORK) LOCK FLUSH 100 UNIT/ML IV SOLN
500.0000 [IU] | Freq: Once | INTRAVENOUS | Status: DC
  Filled 2021-03-04: qty 5

## 2021-03-04 NOTE — Progress Notes (Signed)
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 He had a chest x-ray followed by a CT scan which showed a 5.7 x 4.4 cm left upper lobe mass along with left hilar adenopathy. There was a low-density 3.6 lesion noted in the right hepatic lobe which ultimately turned out to be a hemangioma on MRI liver. PET CT scan showed hypermetabolic activity in the left upper lobe and left hilar nodal tissue. Subcarinal and right paratracheal lymph nodes with mild hypermetabolic features.  Patient underwent bronchoscopies and biopsies.Left upper lobe biopsy was consistent with non-small cell carcinoma favor squamous cell carcinoma right subcarinal lymph node biopsy was negative for malignancy.  Plan is for concurrent chemoradiation with carbotaxol followed by maintenance durvalumab. Patient does not want to get a port placed. He is also refused Covid vaccination  NGS testing showed high tumor mutational burden. PD-L1 90%. MSI stable. Patient PIK3CAand notch2FCHO2 fusion, T p53   Interval history-reports left-sided chest wall pain which he has had at the time of diagnosis.  He is using as needed oxycodone About 3-4 times a day and states that it helps but he needs to rest quite a bit to prevent his pain from flaring  up.  Denies any fever cough or sputum production.  He has baseline shortness of breath from his COPD for which he uses Spiriva and is currently stable.  ECOG PS- 1 Pain scale- 3 Opioid associated constipation- no  Review of systems- Review of Systems  Constitutional: Negative for chills, fever, malaise/fatigue and weight loss.  HENT: Negative for congestion, ear discharge and nosebleeds.   Eyes: Negative for blurred vision.  Respiratory: Negative for cough, hemoptysis, sputum production, shortness of breath and wheezing.        Left-sided chest wall pain  Cardiovascular: Negative for chest pain, palpitations, orthopnea and claudication.  Gastrointestinal: Negative for abdominal pain, blood in stool, constipation, diarrhea, heartburn, melena, nausea and vomiting.  Genitourinary: Negative for dysuria, flank pain, frequency, hematuria and urgency.  Musculoskeletal: Negative for back pain, joint pain and myalgias.  Skin: Negative for rash.  Neurological: Negative for dizziness, tingling, focal weakness, seizures, weakness and headaches.  Endo/Heme/Allergies: Does not bruise/bleed easily.  Psychiatric/Behavioral: Negative for depression and suicidal ideas. The patient does not have insomnia.       No Known Allergies   Past Medical History:  Diagnosis Date  . COPD (chronic obstructive pulmonary disease) ([place])   . Emphysema of lung ([place])   . Lung cancer ([place]) 06/2020  . Pneumonia      Past Surgical History:  Procedure Laterality Date  . BACK SURGERY  2017   lumbar region herniated disc  . VIDEO BRONCHOSCOPY WITH ENDOBRONCHIAL NAVIGATION N/A 08/07/2020   Procedure: VIDEO BRONCHOSCOPY WITH ENDOBRONCHIAL NAVIGATION;  Surgeon: Tyler Pita, MD;  Location: [facility] ORS;  Service: Pulmonary;  Laterality: N/A;  .  VIDEO BRONCHOSCOPY WITH ENDOBRONCHIAL ULTRASOUND N/A 08/07/2020   Procedure: VIDEO BRONCHOSCOPY WITH ENDOBRONCHIAL ULTRASOUND;  Surgeon: Tyler Pita, MD;  Location: [facility]  ORS;  Service: Pulmonary;  Laterality: N/A;    Social History   Socioeconomic History  . Marital status: Divorced    Spouse name: Not on file  . Number of children: Not on file  . Years of education: Not on file  . Highest education level: Not on file  Occupational History  . Not on file  Tobacco Use  . Smoking status: Current Every Day Smoker    Packs/day: 3.00    Years: 35.00    Pack years: 105.00    Types: Cigarettes  . Smokeless tobacco: Former Systems developer    Types: Chew  . Tobacco comment: 2PPD 01/19/2021  Vaping Use  . Vaping Use: Never used  Substance and Sexual Activity  . Alcohol use: Yes    Alcohol/week: 8.0 standard drinks    Types: 8 Cans of beer per week  . Drug use: No  . Sexual activity: Yes  Other Topics Concern  . Not on file  Social History Narrative  . Not on file   Social Determinants of Health   Financial Resource Strain: Not on file  Food Insecurity: Not on file  Transportation Needs: Not on file  Physical Activity: Not on file  Stress: Not on file  Social Connections: Not on file  Intimate Partner Violence: Not on file    Family History  Problem Relation Age of Onset  . Diabetes Father   . Lung cancer Father 44  . Heart attack Father   . Throat cancer Maternal Aunt      Current Outpatient Medications:  .  acetaminophen (TYLENOL) 500 MG tablet, Take 500-1,000 mg by mouth every 6 (six) hours as needed (for pain.)., Disp: , Rfl:  .  albuterol (VENTOLIN HFA) 108 (90 Base) MCG/ACT inhaler, Inhale 2 puffs into the lungs every 6 (six) hours as needed for wheezing or shortness of breath., Disp: 8 g, Rfl: 2 .  budesonide-formoterol (SYMBICORT) 160-4.5 MCG/ACT inhaler, Inhale 2 puffs into the lungs in the morning and at bedtime., Disp: 1 each, Rfl: 12 .  oxyCODONE (OXY IR/ROXICODONE) 5 MG immediate release tablet, Take 1 tablet (5 mg total) by mouth every 4 (four) hours as needed for severe pain., Disp: 120 tablet, Rfl: 0 .  sucralfate (CARAFATE) 1 g  tablet, Take 1 tablet (1 g total) by mouth 3 (three) times daily. Dissolve in 3-4 tbsp warm water, swish and swallow., Disp: 90 tablet, Rfl: 1 .  Tiotropium Bromide Monohydrate (SPIRIVA RESPIMAT) 2.5 MCG/ACT AERS, Inhale 2 puffs into the lungs daily., Disp: 4 g, Rfl: 11 No current facility-administered medications for this visit.  Facility-Administered Medications Ordered in Other Visits:  .  heparin lock flush 100 unit/mL, 500 Units, Intravenous, Once, Sindy Guadeloupe, MD .  sodium chloride flush (NS) 0.9 % injection 10 mL, 10 mL, Intravenous, PRN, Sindy Guadeloupe, MD  Physical exam:  Vitals:   03/04/21 0845  BP: 127/77  Pulse: 78  Resp: 16  Temp: 98.3 F (36.8 C)  TempSrc: Oral  Weight: 182 lb (82.6 kg)  Height: 6' 2"  (1.88 m)   Physical Exam Cardiovascular:     Rate and Rhythm: Normal rate and regular rhythm.     Heart sounds: Normal heart sounds.  Pulmonary:     Effort: Pulmonary effort is normal.     Breath sounds: Normal breath sounds.  Skin:  General: Skin is warm and dry.  Neurological:     Mental Status: He is alert and oriented to person, place, and time.      CMP Latest Ref Rng & Units 02/18/2021  Glucose 70 - 99 mg/dL 67(L)  BUN 6 - 20 mg/dL 13  Creatinine 0.61 - 1.24 mg/dL 0.82  Sodium 135 - 145 mmol/L 137  Potassium 3.5 - 5.1 mmol/L 4.5  Chloride 98 - 111 mmol/L 102  CO2 22 - 32 mmol/L 26  Calcium 8.9 - 10.3 mg/dL 9.1  Total Protein 6.5 - 8.1 g/dL 7.2  Total Bilirubin 0.3 - 1.2 mg/dL 0.5  Alkaline Phos 38 - 126 U/L 54  AST 15 - 41 U/L 15  ALT 0 - 44 U/L 13   CBC Latest Ref Rng & Units 02/18/2021  WBC 4.0 - 10.5 K/uL 6.6  Hemoglobin 13.0 - 17.0 g/dL 14.7  Hematocrit 39.0 - 52.0 % 44.2  Platelets 150 - 400 K/uL 255    No images are attached to the encounter.  CT CHEST ABDOMEN PELVIS W CONTRAST  Result Date: 02/12/2021 CLINICAL DATA:  Left upper lobe lung cancer. Restaging post treatment. EXAM: CT CHEST, ABDOMEN, AND PELVIS WITH CONTRAST TECHNIQUE:  Multidetector CT imaging of the chest, abdomen and pelvis was performed following the standard protocol during bolus administration of intravenous contrast. CONTRAST:  14m OMNIPAQUE IOHEXOL 300 MG/ML  SOLN COMPARISON:  Prior CTs 11/09/2020 and PET-CT 08/11/2020. FINDINGS: CT CHEST FINDINGS Cardiovascular: Mild atherosclerosis of the aorta and coronary arteries. No evidence of acute pulmonary embolism or other acute chest findings. The heart size is normal. There is no pericardial effusion. Mediastinum/Nodes: There are no enlarged mediastinal, hilar or axillary lymph nodes. Small mediastinal and left hilar lymph nodes are unchanged. The thyroid gland, esophagus and trachea demonstrate no significant findings. Lungs/Pleura: No pleural effusion or pneumothorax. Moderate to severe panlobular emphysema again noted. The dominant cavitary cystic mass in the left upper lobe has decreased in size, now measuring 3.1 x 2.4 cm on image 43/4. This previously measured 4.3 x 3.5 cm. This remains thick-walled and contains increased central fluid. There is a new thick walled cavitary lesion inferiorly in the right upper lobe measuring 9 x 7 mm on image 67/4, suspicious for metachronous primary lung cancer, or less likely a cystic metastasis. 5 mm right middle lobe nodule on image 99/4 and 5 mm left lower lobe nodule on image 132/4 are unchanged. However, there are other possible new tiny nodules, most prominent within the lingula on image 53/4 and in the left lower lobe on image 59/4. Musculoskeletal/Chest wall: No chest wall mass or suspicious osseous findings. CT ABDOMEN AND PELVIS FINDINGS Hepatobiliary: The liver is normal in density without suspicious focal abnormality. Multiple (at least 4) hepatic hemangiomas are grossly stable. No evidence of gallstones, gallbladder wall thickening or biliary dilatation. Pancreas: Unremarkable. No pancreatic ductal dilatation or surrounding inflammatory changes. Spleen: Normal in size  without focal abnormality. Adrenals/Urinary Tract: Both adrenal glands appear normal. The kidneys appear normal without evidence of urinary tract calculus, suspicious lesion or hydronephrosis. No bladder abnormalities are seen. Stomach/Bowel: The stomach appears unremarkable for its degree of distension. No evidence of bowel wall thickening, distention or surrounding inflammatory change. The appendix appears normal. There is moderate stool throughout the. Vascular/Lymphatic: There are no enlarged abdominal or pelvic lymph nodes. Small inguinal lymph nodes are stable. Mild aortic and branch vessel atherosclerosis without acute vascular findings. Reproductive: The prostate gland appears unchanged. Other: No evidence of abdominal wall  mass or hernia. No ascites. Musculoskeletal: No acute or significant osseous findings. Stable presumed sebaceous cyst in the subxiphoid anterior abdominal wall. IMPRESSION: 1. Interval decreased size of dominant cavitary cystic mass in the left upper lobe, consistent with response to therapy. 2. New thick walled cavitary lesion inferiorly in the right upper lobe, suspicious for metachronous primary lung cancer, or less likely a cystic metastasis. Possible additional new tiny nodules in the lingula and left lower lobe, potentially metastases. 3. No evidence of metastatic disease within the abdomen or pelvis. 4. Stable hepatic hemangiomas. 5. Aortic Atherosclerosis (ICD10-I70.0) and Emphysema (ICD10-J43.9). Electronically Signed   By: Richardean Sale M.D.   On: 02/12/2021 11:27     Assessment and plan- Patient is a 55 y.o. male withh/ostage III squamous cell carcinoma of the left upper lobe T3 N1 M0.    He is here for on treatment assessment prior to cycle 7 of maintenance durvalumab  Counts okay to proceed with cycle 7 of maintenance durvalumab today.  I will see him back in 2 weeks for cycle 8.  I will plan to get repeat scans in mid April 2022 .  Scans on 02/12/2021 revealed  reduction in the size of left upper lobe lung mass.  But there was a new 9 mm cavitary lesion noted in the right upper lobe as well as other subcentimeter lung nodules which remain equivocal for disease progression.  Left-sided chest wall pain: Possibly secondary to malignancy.  He will continue oxycodone 5 mg every 4 hours as needed and I will add OxyContin 10 mg twice daily to his pain regimen.  I have recommended that he should take MiraLAX and senna to prevent opioid associated constipation   Visit Diagnosis 1. Malignant neoplasm of unspecified part of unspecified bronchus or lung ([place])   2. Encounter for antineoplastic immunotherapy   3. Neoplasm related pain      Dr. Randa Evens, MD, MPH [facility] at [facility] [phone] 03/04/2021 8:37 AM

## 2021-03-04 NOTE — Progress Notes (Signed)
Pt concerned about his pain , it feels like the same pain and situation when he was in hospital and had pneumonia and then got diagnosed with cancer of lung

## 2021-03-05 ENCOUNTER — Other Ambulatory Visit: Payer: Self-pay

## 2021-03-05 ENCOUNTER — Other Ambulatory Visit: Payer: Self-pay | Admitting: Oncology

## 2021-03-05 ENCOUNTER — Telehealth: Payer: Self-pay

## 2021-03-05 ENCOUNTER — Telehealth: Payer: Self-pay | Admitting: *Deleted

## 2021-03-05 MED ORDER — FENTANYL 12 MCG/HR TD PT72: 1.0000 | MEDICATED_PATCH | TRANSDERMAL | 0 refills | Status: DC

## 2021-03-05 MED ORDER — OXYCODONE HCL 5 MG PO TABS: 5.0000 mg | ORAL_TABLET | ORAL | 0 refills | Status: DC | PRN

## 2021-03-05 NOTE — Telephone Encounter (Signed)
Daughter called reporting that patient pain medicine (Oxycontin) has been denied by [facility] and the pharmacy recommends something else be ordered. Please advise

## 2021-03-05 NOTE — Telephone Encounter (Signed)
03/05/2021 LMOM that alternative medication has been sent to [facility], gave instructions of use and asked that patient call back if he has any questions. SJC

## 2021-03-18 ENCOUNTER — Inpatient Hospital Stay: Payer: Medicaid Other

## 2021-03-18 ENCOUNTER — Encounter: Payer: Self-pay | Admitting: Oncology

## 2021-03-18 ENCOUNTER — Inpatient Hospital Stay (HOSPITAL_BASED_OUTPATIENT_CLINIC_OR_DEPARTMENT_OTHER): Payer: Medicaid Other | Admitting: Oncology

## 2021-03-18 VITALS — BP 113/75 | HR 74 | Temp 97.2°F | Resp 18 | Wt 183.0 lb

## 2021-03-18 DIAGNOSIS — G893 Neoplasm related pain (acute) (chronic): Secondary | ICD-10-CM

## 2021-03-18 DIAGNOSIS — C3490 Malignant neoplasm of unspecified part of unspecified bronchus or lung: Secondary | ICD-10-CM

## 2021-03-18 DIAGNOSIS — Z5112 Encounter for antineoplastic immunotherapy: Secondary | ICD-10-CM

## 2021-03-18 LAB — CBC WITH DIFFERENTIAL/PLATELET
Abs Immature Granulocytes: 0.04 10*3/uL (ref 0.00–0.07)
Basophils Absolute: 0.1 10*3/uL (ref 0.0–0.1)
Basophils Relative: 1 %
Eosinophils Absolute: 0.3 10*3/uL (ref 0.0–0.5)
Eosinophils Relative: 4 %
HCT: 41.5 % (ref 39.0–52.0)
Hemoglobin: 14 g/dL (ref 13.0–17.0)
Immature Granulocytes: 1 %
Lymphocytes Relative: 15 %
Lymphs Abs: 1 10*3/uL (ref 0.7–4.0)
MCH: 30.4 pg (ref 26.0–34.0)
MCHC: 33.7 g/dL (ref 30.0–36.0)
MCV: 90 fL (ref 80.0–100.0)
Monocytes Absolute: 0.7 10*3/uL (ref 0.1–1.0)
Monocytes Relative: 10 %
Neutro Abs: 4.9 10*3/uL (ref 1.7–7.7)
Neutrophils Relative %: 69 %
Platelets: 267 10*3/uL (ref 150–400)
RBC: 4.61 MIL/uL (ref 4.22–5.81)
RDW: 12.8 % (ref 11.5–15.5)
WBC: 7.2 10*3/uL (ref 4.0–10.5)
nRBC: 0 % (ref 0.0–0.2)

## 2021-03-18 LAB — COMPREHENSIVE METABOLIC PANEL
ALT: 14 U/L (ref 0–44)
AST: 20 U/L (ref 15–41)
Albumin: 3.8 g/dL (ref 3.5–5.0)
Alkaline Phosphatase: 65 U/L (ref 38–126)
Anion gap: 9 (ref 5–15)
BUN: 6 mg/dL (ref 6–20)
CO2: 24 mmol/L (ref 22–32)
Calcium: 8.7 mg/dL — ABNORMAL LOW (ref 8.9–10.3)
Chloride: 101 mmol/L (ref 98–111)
Creatinine, Ser: 0.86 mg/dL (ref 0.61–1.24)
GFR, Estimated: >60 mL/min (ref >60)
Glucose, Bld: 145 mg/dL — ABNORMAL HIGH (ref 70–99)
Potassium: 4 mmol/L (ref 3.5–5.1)
Sodium: 134 mmol/L — ABNORMAL LOW (ref 135–145)
Total Bilirubin: 0.5 mg/dL (ref 0.3–1.2)
Total Protein: 7 g/dL (ref 6.5–8.1)

## 2021-03-18 MED ORDER — SODIUM CHLORIDE 0.9 % IV SOLN
Freq: Once | INTRAVENOUS | Status: AC
  Filled 2021-03-18: qty 250

## 2021-03-18 MED ORDER — DURVALUMAB 500 MG/10ML IV SOLN
10.0000 mg/kg | Freq: Once | INTRAVENOUS | Status: AC
Start: 2021-03-18 — End: 2021-03-18
  Administered 2021-03-18: 860 mg via INTRAVENOUS
  Filled 2021-03-18: qty 10

## 2021-03-18 NOTE — Progress Notes (Signed)
Pt reports occasional pain in chest, lungs, relieved by oxycodone.  Pt states is not able to wear fentalyl patch due to giving him a headache.

## 2021-03-18 NOTE — Progress Notes (Signed)
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 He had a chest x-ray followed by a CT scan which showed a 5.7 x 4.4 cm left upper lobe mass along with left hilar adenopathy. There was a low-density 3.6 lesion noted in the right hepatic lobe which ultimately turned out to be a hemangioma on MRI liver. PET CT scan showed hypermetabolic activity in the left upper lobe and left hilar nodal tissue. Subcarinal and right paratracheal lymph nodes with mild hypermetabolic features.  Patient underwent bronchoscopies and biopsies.Left upper lobe biopsy was consistent with non-small cell carcinoma favor squamous cell carcinoma right subcarinal lymph node biopsy was negative for malignancy.  Plan is for concurrent chemoradiation with carbotaxol followed by maintenance durvalumab. Patient does not want to get a port placed. He is also refused Covid vaccination  NGS testing showed high tumor mutational burden. PD-L1 90%. MSI stable. Patient PIK3CAand notch2FCHO2 fusion, T p53   Interval history-patient reports he got a headache after 3 days of fentanyl patch and he has not used it since then.  He is only using as needed oxycodone for his pain which is working well for him.  Has occasional cough and exertional  shortness of breath  ECOG PS- 1 Pain scale- 3 Opioid associated constipation- no  Review of systems- Review of Systems  Constitutional: Positive for malaise/fatigue. Negative for chills, fever and weight loss.  HENT: Negative for congestion, ear discharge and nosebleeds.   Eyes: Negative for blurred vision.  Respiratory: Negative for cough, hemoptysis, sputum production, shortness of breath and wheezing.        Left chest wall pain  Cardiovascular: Negative for chest pain, palpitations, orthopnea and claudication.  Gastrointestinal: Negative for abdominal pain, blood in stool, constipation, diarrhea, heartburn, melena, nausea and vomiting.  Genitourinary: Negative for dysuria, flank pain, frequency, hematuria and urgency.  Musculoskeletal: Negative for back pain, joint pain and myalgias.  Skin: Negative for rash.  Neurological: Negative for dizziness, tingling, focal weakness, seizures, weakness and headaches.  Endo/Heme/Allergies: Does not bruise/bleed easily.  Psychiatric/Behavioral: Negative for depression and suicidal ideas. The patient does not have insomnia.        No Known Allergies   Past Medical History:  Diagnosis Date  . COPD (chronic obstructive pulmonary disease) ([place])   . Emphysema of lung ([place])   . Lung cancer ([place]) 06/2020  . Pneumonia      Past Surgical History:  Procedure Laterality Date  . BACK SURGERY  2017   lumbar region herniated disc  . VIDEO BRONCHOSCOPY WITH ENDOBRONCHIAL NAVIGATION N/A 08/07/2020   Procedure: VIDEO BRONCHOSCOPY WITH ENDOBRONCHIAL NAVIGATION;  Surgeon: Tyler Pita, MD;  Location: [facility] ORS;  Service: Pulmonary;  Laterality: N/A;  . VIDEO BRONCHOSCOPY WITH ENDOBRONCHIAL ULTRASOUND N/A 08/07/2020   Procedure: VIDEO BRONCHOSCOPY WITH ENDOBRONCHIAL ULTRASOUND;  Surgeon: Tyler Pita, MD;  Location:  [facility] ORS;  Service: Pulmonary;  Laterality: N/A;    Social History   Socioeconomic History  . Marital status: Divorced     Spouse name: Not on file  . Number of children: Not on file  . Years of education: Not on file  . Highest education level: Not on file  Occupational History  . Not on file  Tobacco Use  . Smoking status: Current Every Day Smoker    Packs/day: 3.00    Years: 35.00    Pack years: 105.00    Types: Cigarettes  . Smokeless tobacco: Former Neurosurgeon    Types: Chew  . Tobacco comment: 2PPD 01/19/2021  Vaping Use  . Vaping Use: Never used  Substance and Sexual Activity  . Alcohol use: Yes    Alcohol/week: 8.0 standard drinks    Types: 8 Cans of beer per week  . Drug use: No  . Sexual activity: Yes  Other Topics Concern  . Not on file  Social History Narrative  . Not on file   Social Determinants of Health   Financial Resource Strain: Not on file  Food Insecurity: Not on file  Transportation Needs: Not on file  Physical Activity: Not on file  Stress: Not on file  Social Connections: Not on file  Intimate Partner Violence: Not on file    Family History  Problem Relation Age of Onset  . Diabetes Father   . Lung cancer Father 30  . Heart attack Father   . Throat cancer Maternal Aunt      Current Outpatient Medications:  .  acetaminophen (TYLENOL) 500 MG tablet, Take 500-1,000 mg by mouth every 6 (six) hours as needed (for pain.)., Disp: , Rfl:  .  albuterol (VENTOLIN HFA) 108 (90 Base) MCG/ACT inhaler, Inhale 2 puffs into the lungs every 6 (six) hours as needed for wheezing or shortness of breath., Disp: 8 g, Rfl: 2 .  budesonide-formoterol (SYMBICORT) 160-4.5 MCG/ACT inhaler, Inhale 2 puffs into the lungs in the morning and at bedtime., Disp: 1 each, Rfl: 12 .  oxyCODONE (OXY IR/ROXICODONE) 5 MG immediate release tablet, Take 1 tablet (5 mg total) by mouth every 4 (four) hours as needed for severe pain., Disp: 120 tablet, Rfl: 0 .  fentaNYL (DURAGESIC) 12 MCG/HR, Place 1 patch onto the skin every 3 (three) days. (Patient not taking: Reported on 03/18/2021), Disp: 10 patch, Rfl:  0 .  Tiotropium Bromide Monohydrate (SPIRIVA RESPIMAT) 2.5 MCG/ACT AERS, Inhale 2 puffs into the lungs daily., Disp: 4 g, Rfl: 11 No current facility-administered medications for this visit.  Facility-Administered Medications Ordered in Other Visits:  .  durvalumab (IMFINZI) 860 mg in sodium chloride 0.9 % 100 mL chemo infusion, 10 mg/kg (Treatment Plan Recorded), Intravenous, Once, Creig Hines, MD, Last Rate: 117 mL/hr at 03/18/21 1114, 860 mg at 03/18/21 1114  Physical exam:  Vitals:   03/18/21 0900  BP: 113/75  Pulse: 74  Resp: 18  Temp: (!) 97.2 F (36.2 C)  TempSrc: Tympanic  SpO2: 100%  Weight: 183 lb (83 kg)   Physical Exam Constitutional:      General: He is not in acute distress. Cardiovascular:     Rate and Rhythm: Normal rate and regular rhythm.     Heart sounds: Normal heart sounds.  Pulmonary:     Effort: Pulmonary effort is normal.     Breath sounds: Normal breath sounds.  Abdominal:     General: Bowel sounds are normal.     Palpations: Abdomen is soft.  Skin:    General: Skin is warm and dry.  Neurological:     Mental Status: He is alert and oriented to person, place, and time.      CMP Latest Ref Rng & Units 03/18/2021  Glucose 70 - 99 mg/dL 145(H)  BUN 6 - 20 mg/dL 6  Creatinine 0.61 - 1.24 mg/dL 0.86  Sodium 135 - 145 mmol/L 134(L)  Potassium 3.5 - 5.1 mmol/L 4.0  Chloride 98 - 111 mmol/L 101  CO2 22 - 32 mmol/L 24  Calcium 8.9 - 10.3 mg/dL 8.7(L)  Total Protein 6.5 - 8.1 g/dL 7.0  Total Bilirubin 0.3 - 1.2 mg/dL 0.5  Alkaline Phos 38 - 126 U/L 65  AST 15 - 41 U/L 20  ALT 0 - 44 U/L 14   CBC Latest Ref Rng & Units 03/18/2021  WBC 4.0 - 10.5 K/uL 7.2  Hemoglobin 13.0 - 17.0 g/dL 14.0  Hematocrit 39.0 - 52.0 % 41.5  Platelets 150 - 400 K/uL 267      Assessment and plan- Patient is a 55 y.o. male withh/ostage III squamous cell carcinoma of the left upper lobe T3 N1 M0.  He is here for on treatment assessment prior to cycle 8 of  maintenance durvalumab  Counts okay to proceed with cycle 8 of maintenance durvalumab today.  I will see him back in 2 weeks for cycle 9.  Repeat scans to be done in 4 weeks time.  Neoplasm related pain: Continue as needed oxycodone   Visit Diagnosis 1. Encounter for antineoplastic immunotherapy   2. Malignant neoplasm of unspecified part of unspecified bronchus or lung ([place])   3. Neoplasm related pain      Dr. Randa Evens, MD, MPH [facility] [phone] 03/18/2021 11:59 AM

## 2021-03-24 ENCOUNTER — Encounter: Payer: Self-pay | Admitting: Oncology

## 2021-04-02 ENCOUNTER — Inpatient Hospital Stay: Payer: Medicaid Other

## 2021-04-02 ENCOUNTER — Inpatient Hospital Stay: Payer: Medicaid Other | Admitting: Oncology

## 2021-04-02 ENCOUNTER — Telehealth: Payer: Self-pay | Admitting: *Deleted

## 2021-04-02 NOTE — Telephone Encounter (Signed)
Called to talk to pt about the pictures that was sent in 3/30 that we did not see until today. Asked him what happened and is it still there. He states that he still has some marks on him but they figured out that the marks on him was from laying on heating pad. It does not hurt at all he said but you can see some of them

## 2021-04-09 ENCOUNTER — Other Ambulatory Visit: Payer: Self-pay

## 2021-04-09 ENCOUNTER — Inpatient Hospital Stay (HOSPITAL_BASED_OUTPATIENT_CLINIC_OR_DEPARTMENT_OTHER): Payer: Medicaid Other | Admitting: Oncology

## 2021-04-09 ENCOUNTER — Encounter: Payer: Self-pay | Admitting: Oncology

## 2021-04-09 ENCOUNTER — Inpatient Hospital Stay: Payer: Medicaid Other

## 2021-04-09 ENCOUNTER — Inpatient Hospital Stay: Payer: Medicaid Other | Attending: Oncology

## 2021-04-09 VITALS — BP 124/77 | HR 70 | Temp 96.0°F | Resp 16 | Wt 180.0 lb

## 2021-04-09 DIAGNOSIS — G893 Neoplasm related pain (acute) (chronic): Secondary | ICD-10-CM | POA: Diagnosis not present

## 2021-04-09 DIAGNOSIS — Z833 Family history of diabetes mellitus: Secondary | ICD-10-CM | POA: Insufficient documentation

## 2021-04-09 DIAGNOSIS — J984 Other disorders of lung: Secondary | ICD-10-CM | POA: Insufficient documentation

## 2021-04-09 DIAGNOSIS — Z5112 Encounter for antineoplastic immunotherapy: Secondary | ICD-10-CM | POA: Diagnosis not present

## 2021-04-09 DIAGNOSIS — Z28310 Unvaccinated for covid-19: Secondary | ICD-10-CM | POA: Diagnosis not present

## 2021-04-09 DIAGNOSIS — Z801 Family history of malignant neoplasm of trachea, bronchus and lung: Secondary | ICD-10-CM | POA: Insufficient documentation

## 2021-04-09 DIAGNOSIS — Z79899 Other long term (current) drug therapy: Secondary | ICD-10-CM | POA: Insufficient documentation

## 2021-04-09 DIAGNOSIS — R946 Abnormal results of thyroid function studies: Secondary | ICD-10-CM | POA: Diagnosis not present

## 2021-04-09 DIAGNOSIS — Z8249 Family history of ischemic heart disease and other diseases of the circulatory system: Secondary | ICD-10-CM | POA: Insufficient documentation

## 2021-04-09 DIAGNOSIS — Z7289 Other problems related to lifestyle: Secondary | ICD-10-CM | POA: Diagnosis not present

## 2021-04-09 DIAGNOSIS — C3490 Malignant neoplasm of unspecified part of unspecified bronchus or lung: Secondary | ICD-10-CM

## 2021-04-09 DIAGNOSIS — R5383 Other fatigue: Secondary | ICD-10-CM | POA: Diagnosis not present

## 2021-04-09 DIAGNOSIS — F17210 Nicotine dependence, cigarettes, uncomplicated: Secondary | ICD-10-CM | POA: Insufficient documentation

## 2021-04-09 DIAGNOSIS — R0602 Shortness of breath: Secondary | ICD-10-CM | POA: Diagnosis not present

## 2021-04-09 DIAGNOSIS — R0789 Other chest pain: Secondary | ICD-10-CM | POA: Diagnosis not present

## 2021-04-09 DIAGNOSIS — Z808 Family history of malignant neoplasm of other organs or systems: Secondary | ICD-10-CM | POA: Diagnosis not present

## 2021-04-09 DIAGNOSIS — D1803 Hemangioma of intra-abdominal structures: Secondary | ICD-10-CM | POA: Insufficient documentation

## 2021-04-09 DIAGNOSIS — C3412 Malignant neoplasm of upper lobe, left bronchus or lung: Secondary | ICD-10-CM | POA: Insufficient documentation

## 2021-04-09 LAB — COMPREHENSIVE METABOLIC PANEL
ALT: 12 U/L (ref 0–44)
AST: 19 U/L (ref 15–41)
Albumin: 3.8 g/dL (ref 3.5–5.0)
Alkaline Phosphatase: 54 U/L (ref 38–126)
Anion gap: 10 (ref 5–15)
BUN: 8 mg/dL (ref 6–20)
CO2: 21 mmol/L — ABNORMAL LOW (ref 22–32)
Calcium: 9 mg/dL (ref 8.9–10.3)
Chloride: 102 mmol/L (ref 98–111)
Creatinine, Ser: 0.81 mg/dL (ref 0.61–1.24)
GFR, Estimated: >60 mL/min (ref >60)
Glucose, Bld: 132 mg/dL — ABNORMAL HIGH (ref 70–99)
Potassium: 4.1 mmol/L (ref 3.5–5.1)
Sodium: 133 mmol/L — ABNORMAL LOW (ref 135–145)
Total Bilirubin: 0.4 mg/dL (ref 0.3–1.2)
Total Protein: 6.9 g/dL (ref 6.5–8.1)

## 2021-04-09 LAB — CBC WITH DIFFERENTIAL/PLATELET
Abs Immature Granulocytes: 0.02 10*3/uL (ref 0.00–0.07)
Basophils Absolute: 0.1 10*3/uL (ref 0.0–0.1)
Basophils Relative: 1 %
Eosinophils Absolute: 0.3 10*3/uL (ref 0.0–0.5)
Eosinophils Relative: 4 %
HCT: 39.9 % (ref 39.0–52.0)
Hemoglobin: 13.5 g/dL (ref 13.0–17.0)
Immature Granulocytes: 0 %
Lymphocytes Relative: 14 %
Lymphs Abs: 1 10*3/uL (ref 0.7–4.0)
MCH: 30.5 pg (ref 26.0–34.0)
MCHC: 33.8 g/dL (ref 30.0–36.0)
MCV: 90.1 fL (ref 80.0–100.0)
Monocytes Absolute: 0.8 10*3/uL (ref 0.1–1.0)
Monocytes Relative: 11 %
Neutro Abs: 4.9 10*3/uL (ref 1.7–7.7)
Neutrophils Relative %: 70 %
Platelets: 279 10*3/uL (ref 150–400)
RBC: 4.43 MIL/uL (ref 4.22–5.81)
RDW: 13.5 % (ref 11.5–15.5)
WBC: 7 10*3/uL (ref 4.0–10.5)
nRBC: 0 % (ref 0.0–0.2)

## 2021-04-09 MED ORDER — OXYCODONE HCL 5 MG PO TABS: 5.0000 mg | ORAL_TABLET | ORAL | 0 refills | Status: DC | PRN

## 2021-04-09 MED ORDER — SODIUM CHLORIDE 0.9 % IV SOLN
Freq: Once | INTRAVENOUS | Status: AC
  Filled 2021-04-09: qty 250

## 2021-04-09 MED ORDER — SODIUM CHLORIDE 0.9 % IV SOLN
10.0000 mg/kg | Freq: Once | INTRAVENOUS | Status: AC
  Administered 2021-04-09: 860 mg via INTRAVENOUS
  Filled 2021-04-09: qty 10

## 2021-04-09 NOTE — Progress Notes (Signed)
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 He had a chest x-ray followed by a CT scan which showed a 5.7 x 4.4 cm left upper lobe mass along with left hilar adenopathy. There was a low-density 3.6 lesion noted in the right hepatic lobe which ultimately turned out to be a hemangioma on MRI liver. PET CT scan showed hypermetabolic activity in the left upper lobe and left hilar nodal tissue. Subcarinal and right paratracheal lymph nodes with mild hypermetabolic features.  Patient underwent bronchoscopies and biopsies.Left upper lobe biopsy was consistent with non-small cell carcinoma favor squamous cell carcinoma right subcarinal lymph node biopsy was negative for malignancy.  Plan is for concurrent chemoradiation with carbotaxol followed by maintenance durvalumab. Patient does not want to get a port placed. He is also refused Covid vaccination  NGS testing showed high tumor mutational burden. PD-L1 90%. MSI stable. Patient PIK3CAand notch2FCHO2 fusion, T p53  Interval history-patient slept on a heating pad which caused a lacy reticular rash on his lower back which is now healing.  He has baseline mild exertional shortness of breath and left chest wall pain which is essentially  stable.  ECOG PS- 1 Pain scale- 2 Opioid associated constipation- no  Review of systems-Review of Systems  Constitutional: Positive for malaise/fatigue. Negative for chills, fever and weight loss.  HENT: Negative for congestion, ear discharge and nosebleeds.   Eyes: Negative for blurred vision.  Respiratory: Negative for cough, hemoptysis, sputum production, shortness of breath and wheezing.        Left chest wall pain  Cardiovascular: Negative for chest pain, palpitations, orthopnea and claudication.  Gastrointestinal: Negative for abdominal pain, blood in stool, constipation, diarrhea, heartburn, melena, nausea and vomiting.  Genitourinary: Negative for dysuria, flank pain, frequency, hematuria and urgency.  Musculoskeletal: Negative for back pain, joint pain and myalgias.  Skin: Negative for rash.  Neurological: Negative for dizziness, tingling, focal weakness, seizures, weakness and headaches.  Endo/Heme/Allergies: Does not bruise/bleed easily.  Psychiatric/Behavioral: Negative for depression and suicidal ideas. The patient does not have insomnia.      No Known Allergies   Past Medical History:  Diagnosis Date  . COPD (chronic obstructive pulmonary disease) ([place])   . Emphysema of lung ([place])   . Lung cancer ([place]) 06/2020  . Pneumonia      Past Surgical History:  Procedure Laterality Date  . BACK SURGERY  2017   lumbar region herniated disc  . VIDEO BRONCHOSCOPY WITH ENDOBRONCHIAL NAVIGATION N/A 08/07/2020   Procedure: VIDEO BRONCHOSCOPY WITH ENDOBRONCHIAL NAVIGATION;  Surgeon: Tyler Pita, MD;  Location: [facility] ORS;  Service: Pulmonary;  Laterality: N/A;  . VIDEO BRONCHOSCOPY WITH ENDOBRONCHIAL ULTRASOUND N/A 08/07/2020   Procedure: VIDEO BRONCHOSCOPY WITH ENDOBRONCHIAL ULTRASOUND;  Surgeon: Tyler Pita, MD;  Location: [facility] ORS;  Service: Pulmonary;  Laterality: N/A;    Social  History   Socioeconomic History  . Marital status: Divorced    Spouse name: Not  on file  . Number of children: Not on file  . Years of education: Not on file  . Highest education level: Not on file  Occupational History  . Not on file  Tobacco Use  . Smoking status: Current Every Day Smoker    Packs/day: 3.00    Years: 35.00    Pack years: 105.00    Types: Cigarettes  . Smokeless tobacco: Former Systems developer    Types: Chew  . Tobacco comment: 2PPD 01/19/2021  Vaping Use  . Vaping Use: Never used  Substance and Sexual Activity  . Alcohol use: Yes    Alcohol/week: 8.0 standard drinks    Types: 8 Cans of beer per week  . Drug use: No  . Sexual activity: Yes  Other Topics Concern  . Not on file  Social History Narrative  . Not on file   Social Determinants of Health   Financial Resource Strain: Not on file  Food Insecurity: Not on file  Transportation Needs: Not on file  Physical Activity: Not on file  Stress: Not on file  Social Connections: Not on file  Intimate Partner Violence: Not on file    Family History  Problem Relation Age of Onset  . Diabetes Father   . Lung cancer Father 45  . Heart attack Father   . Throat cancer Maternal Aunt      Current Outpatient Medications:  .  acetaminophen (TYLENOL) 500 MG tablet, Take 500-1,000 mg by mouth every 6 (six) hours as needed (for pain.)., Disp: , Rfl:  .  albuterol (VENTOLIN HFA) 108 (90 Base) MCG/ACT inhaler, Inhale 2 puffs into the lungs every 6 (six) hours as needed for wheezing or shortness of breath., Disp: 8 g, Rfl: 2 .  budesonide-formoterol (SYMBICORT) 160-4.5 MCG/ACT inhaler, Inhale 2 puffs into the lungs in the morning and at bedtime., Disp: 1 each, Rfl: 12 .  fentaNYL (DURAGESIC) 12 MCG/HR, Place 1 patch onto the skin every 3 (three) days., Disp: 10 patch, Rfl: 0 .  Tiotropium Bromide Monohydrate (SPIRIVA RESPIMAT) 2.5 MCG/ACT AERS, Inhale 2 puffs into the lungs daily., Disp: 4 g, Rfl: 11 .  oxyCODONE (OXY IR/ROXICODONE) 5 MG immediate release tablet, Take 1 tablet (5 mg total) by mouth every 4  (four) hours as needed for severe pain., Disp: 120 tablet, Rfl: 0 No current facility-administered medications for this visit.  Facility-Administered Medications Ordered in Other Visits:  .  durvalumab (IMFINZI) 860 mg in sodium chloride 0.9 % 100 mL chemo infusion, 10 mg/kg (Treatment Plan Recorded), Intravenous, Once, Sindy Guadeloupe, MD, Last Rate: 117 mL/hr at 04/09/21 0928, 860 mg at 04/09/21 0928  Physical exam:  Vitals:   04/09/21 0842  BP: 124/77  Pulse: 70  Resp: 16  Temp: (!) 96 F (35.6 C)  TempSrc: Tympanic  SpO2: 100%  Weight: 180 lb (81.6 kg)   Physical Exam Constitutional:      General: He is not in acute distress. Cardiovascular:     Rate and Rhythm: Normal rate and regular rhythm.     Heart sounds: Normal heart sounds.  Pulmonary:     Effort: Pulmonary effort is normal.     Breath sounds: Normal breath sounds.  Skin:    General: Skin is warm and dry.     Comments: Lacy reticular rash over the lower back appears to be healing with areas of hyperpigmentation  Neurological:  Mental Status: He is alert and oriented to person, place, and time.      CMP Latest Ref Rng & Units 04/09/2021  Glucose 70 - 99 mg/dL 132(H)  BUN 6 - 20 mg/dL 8  Creatinine 0.61 - 1.24 mg/dL 0.81  Sodium 135 - 145 mmol/L 133(L)  Potassium 3.5 - 5.1 mmol/L 4.1  Chloride 98 - 111 mmol/L 102  CO2 22 - 32 mmol/L 21(L)  Calcium 8.9 - 10.3 mg/dL 9.0  Total Protein 6.5 - 8.1 g/dL 6.9  Total Bilirubin 0.3 - 1.2 mg/dL 0.4  Alkaline Phos 38 - 126 U/L 54  AST 15 - 41 U/L 19  ALT 0 - 44 U/L 12   CBC Latest Ref Rng & Units 04/09/2021  WBC 4.0 - 10.5 K/uL 7.0  Hemoglobin 13.0 - 17.0 g/dL 13.5  Hematocrit 39.0 - 52.0 % 39.9  Platelets 150 - 400 K/uL 279     Assessment and plan- Patient is a 55 y.o. male  withh/ostage III squamous cell carcinoma of the left upper lobe T3 N1 M0.   He is here for on treatment assessment prior to cycle 9 of maintenance durvalumab  Counts okay to proceed  with cycle 9 of maintenance durvalumab today.  I will see him back in 2 weeks for cycle 10.  Repeat CT chest abdomen and pelvis with contrast in 10 days time.  Neoplasm related pain: Continue as needed oxycodone   Visit Diagnosis 1. Malignant neoplasm of unspecified part of unspecified bronchus or lung ([place])   2. Encounter for antineoplastic immunotherapy   3. Neoplasm related pain      Dr. Randa Evens, MD, MPH [facility] at [facility] [phone] 04/09/2021 9:43 AM

## 2021-04-12 ENCOUNTER — Other Ambulatory Visit: Payer: Self-pay

## 2021-04-12 ENCOUNTER — Encounter: Payer: Self-pay | Admitting: Radiation Oncology

## 2021-04-12 ENCOUNTER — Ambulatory Visit
Admission: RE | Admit: 2021-04-12 | Discharge: 2021-04-12 | Disposition: A | Discharge status: Home / Self Care | Payer: Medicaid Other | Source: Ambulatory Visit | Attending: Radiation Oncology | Admitting: Radiation Oncology

## 2021-04-12 VITALS — BP 124/73 | HR 73 | Wt 181.3 lb

## 2021-04-12 DIAGNOSIS — C3490 Malignant neoplasm of unspecified part of unspecified bronchus or lung: Secondary | ICD-10-CM

## 2021-04-12 DIAGNOSIS — C3412 Malignant neoplasm of upper lobe, left bronchus or lung: Secondary | ICD-10-CM | POA: Diagnosis not present

## 2021-04-12 DIAGNOSIS — Z08 Encounter for follow-up examination after completed treatment for malignant neoplasm: Secondary | ICD-10-CM | POA: Diagnosis not present

## 2021-04-12 DIAGNOSIS — Z923 Personal history of irradiation: Secondary | ICD-10-CM | POA: Insufficient documentation

## 2021-04-12 DIAGNOSIS — Z9221 Personal history of antineoplastic chemotherapy: Secondary | ICD-10-CM | POA: Diagnosis not present

## 2021-04-12 DIAGNOSIS — Z87891 Personal history of nicotine dependence: Secondary | ICD-10-CM | POA: Diagnosis not present

## 2021-04-12 NOTE — Progress Notes (Signed)
Radiation Oncology Follow up Note  Name: Wesley Ferguson   Date:   04/12/2021 MRN:  [id number] DOB: [birth year]    This 55 y.o. male presents to the clinic today for 67-month follow-up status post concurrent chemoradiation therapy for stage IIIa (T2 N2 M0) non-small cell lung cancer favoring squamous cell carcinoma of the left lung.  REFERRING PROVIDER: No ref. provider found  HPI: Patient is a 55 year old male now seen out 5 months have completed concurrent chemoradiation for stage IIIa non-small cell lung cancer favoring squamous cell carcinoma left lung seen today in routine follow-up he is doing fairly well.  He states he does have some chest tightness no significant cough hemoptysis or dysphagia..  We are tracking a new thick-walled cavitary lesion in the right upper lobe suspicious for metachronous primary lung cancer and he has a CT scan next week.  The dominant cavitary cystic mass in left upper lobe shows interval decrease in size consistent with response.  Patient is currently onmaintenance durvalumab which she is tolerating well.  COMPLICATIONS OF TREATMENT: none  FOLLOW UP COMPLIANCE: keeps appointments   PHYSICAL EXAM:  BP 124/73   Pulse 73   Wt 181 lb 5 oz (82.2 kg)   SpO2 100% Comment: room air  BMI 23.28 kg/m  Well-developed well-nourished patient in NAD. HEENT reveals PERLA, EOMI, discs not visualized.  Oral cavity is clear. No oral mucosal lesions are identified. Neck is clear without evidence of cervical or supraclavicular adenopathy. Lungs are clear to A&P. Cardiac examination is essentially unremarkable with regular rate and rhythm without murmur rub or thrill. Abdomen is benign with no organomegaly or masses noted. Motor sensory and DTR levels are equal and symmetric in the upper and lower extremities. Cranial nerves II through XII are grossly intact. Proprioception is intact. No peripheral adenopathy or edema is identified. No motor or sensory levels are noted. Crude visual  fields are within normal range.  RADIOLOGY RESULTS: CT scan reviewed compatible with above-stated findings.  PLAN: Patient will have a follow-up CT scan in the next 2 weeks.  I would like to review those findings.  I am concerned about the contralateral lesion of the right lung.  We will review those findings when the become available.  He continues management and treatment with medical oncology.  He is currently receiving immunotherapy.  I have tentatively set up a 28-month follow-up although we will see him sooner should he show up with disease progression in his right upper lobe.  Patient comprehends my recommendations well.  I would like to take this opportunity to thank you for allowing me to participate in the care of your patient.Noreene Filbert, MD

## 2021-04-21 ENCOUNTER — Ambulatory Visit
Admission: RE | Admit: 2021-04-21 | Discharge: 2021-04-21 | Disposition: A | Discharge status: Home / Self Care | Payer: Medicaid Other | Source: Ambulatory Visit | Attending: Oncology | Admitting: Oncology

## 2021-04-21 ENCOUNTER — Other Ambulatory Visit: Payer: Self-pay

## 2021-04-21 DIAGNOSIS — J984 Other disorders of lung: Secondary | ICD-10-CM | POA: Diagnosis not present

## 2021-04-21 DIAGNOSIS — D1809 Hemangioma of other sites: Secondary | ICD-10-CM | POA: Diagnosis not present

## 2021-04-21 DIAGNOSIS — K769 Liver disease, unspecified: Secondary | ICD-10-CM | POA: Diagnosis not present

## 2021-04-21 DIAGNOSIS — C3490 Malignant neoplasm of unspecified part of unspecified bronchus or lung: Secondary | ICD-10-CM | POA: Insufficient documentation

## 2021-04-21 DIAGNOSIS — C3412 Malignant neoplasm of upper lobe, left bronchus or lung: Secondary | ICD-10-CM | POA: Diagnosis not present

## 2021-04-21 DIAGNOSIS — R911 Solitary pulmonary nodule: Secondary | ICD-10-CM | POA: Diagnosis not present

## 2021-04-21 MED ORDER — IOHEXOL 300 MG/ML  SOLN
100.0000 mL | Freq: Once | INTRAMUSCULAR | Status: AC | PRN
  Administered 2021-04-21: 100 mL via INTRAVENOUS

## 2021-04-23 ENCOUNTER — Other Ambulatory Visit: Payer: Self-pay

## 2021-04-23 ENCOUNTER — Inpatient Hospital Stay: Payer: Medicaid Other

## 2021-04-23 ENCOUNTER — Other Ambulatory Visit: Payer: Medicaid Other

## 2021-04-23 ENCOUNTER — Encounter: Payer: Self-pay | Admitting: Oncology

## 2021-04-23 ENCOUNTER — Inpatient Hospital Stay (HOSPITAL_BASED_OUTPATIENT_CLINIC_OR_DEPARTMENT_OTHER): Payer: Medicaid Other | Admitting: Oncology

## 2021-04-23 VITALS — BP 102/65 | HR 63 | Resp 16

## 2021-04-23 VITALS — BP 108/70 | HR 68 | Temp 96.0°F | Resp 18 | Wt 180.0 lb

## 2021-04-23 DIAGNOSIS — C3490 Malignant neoplasm of unspecified part of unspecified bronchus or lung: Secondary | ICD-10-CM

## 2021-04-23 DIAGNOSIS — R7989 Other specified abnormal findings of blood chemistry: Secondary | ICD-10-CM

## 2021-04-23 DIAGNOSIS — Z5112 Encounter for antineoplastic immunotherapy: Secondary | ICD-10-CM | POA: Diagnosis not present

## 2021-04-23 LAB — CBC WITH DIFFERENTIAL/PLATELET
Abs Immature Granulocytes: 0.02 10*3/uL (ref 0.00–0.07)
Basophils Absolute: 0.1 10*3/uL (ref 0.0–0.1)
Basophils Relative: 1 %
Eosinophils Absolute: 0.4 10*3/uL (ref 0.0–0.5)
Eosinophils Relative: 6 %
HCT: 40.7 % (ref 39.0–52.0)
Hemoglobin: 13.6 g/dL (ref 13.0–17.0)
Immature Granulocytes: 0 %
Lymphocytes Relative: 14 %
Lymphs Abs: 1 10*3/uL (ref 0.7–4.0)
MCH: 30.2 pg (ref 26.0–34.0)
MCHC: 33.4 g/dL (ref 30.0–36.0)
MCV: 90.4 fL (ref 80.0–100.0)
Monocytes Absolute: 0.7 10*3/uL (ref 0.1–1.0)
Monocytes Relative: 9 %
Neutro Abs: 4.8 10*3/uL (ref 1.7–7.7)
Neutrophils Relative %: 70 %
Platelets: 269 10*3/uL (ref 150–400)
RBC: 4.5 MIL/uL (ref 4.22–5.81)
RDW: 13.7 % (ref 11.5–15.5)
WBC: 7.1 10*3/uL (ref 4.0–10.5)
nRBC: 0 % (ref 0.0–0.2)

## 2021-04-23 LAB — COMPREHENSIVE METABOLIC PANEL
ALT: 11 U/L (ref 0–44)
AST: 18 U/L (ref 15–41)
Albumin: 3.7 g/dL (ref 3.5–5.0)
Alkaline Phosphatase: 61 U/L (ref 38–126)
Anion gap: 8 (ref 5–15)
BUN: <5 mg/dL — ABNORMAL LOW (ref 6–20)
CO2: 24 mmol/L (ref 22–32)
Calcium: 8.8 mg/dL — ABNORMAL LOW (ref 8.9–10.3)
Chloride: 101 mmol/L (ref 98–111)
Creatinine, Ser: 0.87 mg/dL (ref 0.61–1.24)
GFR, Estimated: >60 mL/min (ref >60)
Glucose, Bld: 119 mg/dL — ABNORMAL HIGH (ref 70–99)
Potassium: 4.4 mmol/L (ref 3.5–5.1)
Sodium: 133 mmol/L — ABNORMAL LOW (ref 135–145)
Total Bilirubin: 0.4 mg/dL (ref 0.3–1.2)
Total Protein: 6.6 g/dL (ref 6.5–8.1)

## 2021-04-23 MED ORDER — SODIUM CHLORIDE 0.9 % IV SOLN
Freq: Once | INTRAVENOUS | Status: AC
  Filled 2021-04-23: qty 250

## 2021-04-23 MED ORDER — SODIUM CHLORIDE 0.9 % IV SOLN
10.0000 mg/kg | Freq: Once | INTRAVENOUS | Status: AC
  Administered 2021-04-23: 860 mg via INTRAVENOUS
  Filled 2021-04-23: qty 10

## 2021-04-23 NOTE — Progress Notes (Signed)
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 He had a chest x-ray followed by a CT scan which showed a 5.7 x 4.4 cm left upper lobe mass along with left hilar adenopathy. There was a low-density 3.6 lesion noted in the right hepatic lobe which ultimately turned out to be a hemangioma on MRI liver. PET CT scan showed hypermetabolic activity in the left upper lobe and left hilar nodal tissue. Subcarinal and right paratracheal lymph nodes with mild hypermetabolic features.  Patient underwent bronchoscopies and biopsies.Left upper lobe biopsy was consistent with non-small cell carcinoma favor squamous cell carcinoma right subcarinal lymph node biopsy was negative for malignancy.  Plan is for concurrent chemoradiation with carbotaxol followed by maintenance durvalumab. Patient does not want to get a port placed. He is also refused Covid vaccination  NGS testing showed high tumor mutational burden. PD-L1 90%. MSI stable. Patient PIK3CAand notch2FCHO2 fusion, T p53  Interval history-patient reports ongoing fatigue and sometimes exertional shortness of breath.  Some days he spends most of the time just resting.  He is independent of his ADLs.   Reports occasional chest wall pain for which she takes as needed oxycodone  ECOG PS- 1 Pain scale- 2 Opioid associated constipation- no  Review of systems- Review of Systems  Constitutional: Negative for chills, fever, malaise/fatigue and weight loss.  HENT: Negative for congestion, ear discharge and nosebleeds.   Eyes: Negative for blurred vision.  Respiratory: Negative for cough, hemoptysis, sputum production, shortness of breath and wheezing.   Cardiovascular: Negative for chest pain, palpitations, orthopnea and claudication.  Gastrointestinal: Negative for abdominal pain, blood in stool, constipation, diarrhea, heartburn, melena, nausea and vomiting.  Genitourinary: Negative for dysuria, flank pain, frequency, hematuria and urgency.  Musculoskeletal: Negative for back pain, joint pain and myalgias.  Skin: Negative for rash.  Neurological: Negative for dizziness, tingling, focal weakness, seizures, weakness and headaches.  Endo/Heme/Allergies: Does not bruise/bleed easily.  Psychiatric/Behavioral: Negative for depression and suicidal ideas. The patient does not have insomnia.       No Known Allergies   Past Medical History:  Diagnosis Date  . COPD (chronic obstructive pulmonary disease) ([place])   . Emphysema of lung ([place])   . Lung cancer ([place]) 06/2020  . Pneumonia      Past Surgical History:  Procedure Laterality Date  . BACK SURGERY  2017   lumbar region herniated disc  . VIDEO BRONCHOSCOPY WITH ENDOBRONCHIAL NAVIGATION N/A 08/07/2020   Procedure: VIDEO BRONCHOSCOPY WITH ENDOBRONCHIAL NAVIGATION;  Surgeon: Tyler Pita, MD;  Location: [facility] ORS;  Service: Pulmonary;  Laterality: N/A;  . VIDEO BRONCHOSCOPY WITH ENDOBRONCHIAL ULTRASOUND N/A 08/07/2020   Procedure: VIDEO BRONCHOSCOPY WITH ENDOBRONCHIAL ULTRASOUND;  Surgeon: Tyler Pita, MD;  Location: [facility] ORS;  Service: Pulmonary;  Laterality: N/A;  Social History   Socioeconomic History  . Marital status:  Divorced    Spouse name: Not on file  . Number of children: Not on file  . Years of education: Not on file  . Highest education level: Not on file  Occupational History  . Not on file  Tobacco Use  . Smoking status: Current Every Day Smoker    Packs/day: 3.00    Years: 35.00    Pack years: 105.00    Types: Cigarettes  . Smokeless tobacco: Former Systems developer    Types: Chew  . Tobacco comment: 2PPD 01/19/2021  Vaping Use  . Vaping Use: Never used  Substance and Sexual Activity  . Alcohol use: Yes    Alcohol/week: 8.0 standard drinks    Types: 8 Cans of beer per week  . Drug use: No  . Sexual activity: Yes  Other Topics Concern  . Not on file  Social History Narrative  . Not on file   Social Determinants of Health   Financial Resource Strain: Not on file  Food Insecurity: Not on file  Transportation Needs: Not on file  Physical Activity: Not on file  Stress: Not on file  Social Connections: Not on file  Intimate Partner Violence: Not on file    Family History  Problem Relation Age of Onset  . Diabetes Father   . Lung cancer Father 57  . Heart attack Father   . Throat cancer Maternal Aunt      Current Outpatient Medications:  .  acetaminophen (TYLENOL) 500 MG tablet, Take 500-1,000 mg by mouth every 6 (six) hours as needed (for pain.)., Disp: , Rfl:  .  albuterol (VENTOLIN HFA) 108 (90 Base) MCG/ACT inhaler, Inhale 2 puffs into the lungs every 6 (six) hours as needed for wheezing or shortness of breath., Disp: 8 g, Rfl: 2 .  budesonide-formoterol (SYMBICORT) 160-4.5 MCG/ACT inhaler, Inhale 2 puffs into the lungs in the morning and at bedtime., Disp: 1 each, Rfl: 12 .  oxyCODONE (OXY IR/ROXICODONE) 5 MG immediate release tablet, Take 1 tablet (5 mg total) by mouth every 4 (four) hours as needed for severe pain., Disp: 120 tablet, Rfl: 0 .  fentaNYL (DURAGESIC) 12 MCG/HR, Place 1 patch onto the skin every 3 (three) days. (Patient not taking: Reported on 04/23/2021), Disp: 10  patch, Rfl: 0 .  Tiotropium Bromide Monohydrate (SPIRIVA RESPIMAT) 2.5 MCG/ACT AERS, Inhale 2 puffs into the lungs daily. (Patient not taking: Reported on 04/23/2021), Disp: 4 g, Rfl: 11 No current facility-administered medications for this visit.  Facility-Administered Medications Ordered in Other Visits:  .  durvalumab (IMFINZI) 860 mg in sodium chloride 0.9 % 100 mL chemo infusion, 10 mg/kg (Treatment Plan Recorded), Intravenous, Once, Sindy Guadeloupe, MD  Physical exam:  Vitals:   04/23/21 0829  BP: 108/70  Pulse: 68  Resp: 18  Temp: (!) 96 F (35.6 C)  SpO2: 100%  Weight: 180 lb (81.6 kg)   Physical Exam Constitutional:      General: He is not in acute distress. Cardiovascular:     Rate and Rhythm: Normal rate.     Heart sounds: Normal heart sounds.  Pulmonary:     Effort: Pulmonary effort is normal.  Skin:    General: Skin is warm and dry.  Neurological:     Mental Status: He is alert and oriented to person, place, and time.      CMP Latest Ref Rng & Units 04/23/2021  Glucose 70 - 99 mg/dL 119(H)  BUN 6 -  20 mg/dL <5(L)  Creatinine 0.61 - 1.24 mg/dL 0.87  Sodium 135 - 145 mmol/L 133(L)  Potassium 3.5 - 5.1 mmol/L 4.4  Chloride 98 - 111 mmol/L 101  CO2 22 - 32 mmol/L 24  Calcium 8.9 - 10.3 mg/dL 8.8(L)  Total Protein 6.5 - 8.1 g/dL 6.6  Total Bilirubin 0.3 - 1.2 mg/dL 0.4  Alkaline Phos 38 - 126 U/L 61  AST 15 - 41 U/L 18  ALT 0 - 44 U/L 11   CBC Latest Ref Rng & Units 04/23/2021  WBC 4.0 - 10.5 K/uL 7.1  Hemoglobin 13.0 - 17.0 g/dL 13.6  Hematocrit 39.0 - 52.0 % 40.7  Platelets 150 - 400 K/uL 269    No images are attached to the encounter.  CT CHEST ABDOMEN PELVIS W CONTRAST  Result Date: 04/21/2021 CLINICAL DATA:  Squamous cell carcinoma of the LEFT upper lobe. Stage 3. Maintenance chemotherapy ongoing. EXAM: CT CHEST, ABDOMEN, AND PELVIS WITH CONTRAST TECHNIQUE: Multidetector CT imaging of the chest, abdomen and pelvis was performed following the  standard protocol during bolus administration of intravenous contrast. CONTRAST:  188m OMNIPAQUE IOHEXOL 300 MG/ML  SOLN COMPARISON:  CT 02/12/2021 FINDINGS: CT CHEST FINDINGS Cardiovascular: No significant vascular findings. Normal heart size. No pericardial effusion. Mediastinum/Nodes: No axillary or supraclavicular adenopathy. No mediastinal or hilar adenopathy. No pericardial fluid. Esophagus normal. Lungs/Pleura: Small cavitary lesion in the RIGHT upper lobe is increased in size measuring 1.4 cm in greatest dimension compared to 0.9 cm. Lesion is thick-walled typical of squamous cell carcinoma. Cavitary lesion in the LEFT upper lobe has solidified measuring 2.7 x 2.3 cm compared to 3.0 x 2.1 cm. Just medial dominant lesion is a new nodule measuring 1.7 cm (image 54/series 4) increased from small 1.0 cm lesion. New small cavitary lesion in the RIGHT lower lobe measures 0.5 cm on image 67/series 4. Musculoskeletal: No aggressive osseous lesion. CT ABDOMEN AND PELVIS FINDINGS Hepatobiliary: Stable enhancing lesion in the RIGHT hepatic lobe determine hemangioma. Enhancing lesion in the LEFT hepatic lobe (image 58/2) is also stable. No new hepatic lesions Pancreas: Pancreas is normal. No ductal dilatation. No pancreatic inflammation. Spleen: Normal spleen Adrenals/urinary tract: Adrenal glands and kidneys are normal. The ureters and bladder normal. Stomach/Bowel: Stomach, small bowel, appendix, and cecum are normal. The colon and rectosigmoid colon are normal. Vascular/Lymphatic: Abdominal aorta is normal caliber. There is no retroperitoneal or periportal lymphadenopathy. No pelvic lymphadenopathy. Reproductive: Unremarkable. Other: No peritoneal metastasis. Musculoskeletal: No aggressive osseous lesion. IMPRESSION: Chest Impression: 1. Interval enlargement thick-walled cavitary nodule in the RIGHT upper lobe consistent with squamous cell carcinoma. 2. New cavitary nodule in the RIGHT lower lobe consistent with  synchronous bronchogenic carcinoma versus metastatic lesion. 3. Interval increase in nodularity at primary malignancy in LEFT upper lobe consistent with local disease progression. Abdomen / Pelvis Impression: 1. Stable enhancing lesions in the liver determined benign on comparison imaging. 2. No evidence of metastatic disease in the abdomen pelvis. Electronically Signed   By: SSuzy BouchardM.D.   On: 04/21/2021 14:32     Assessment and plan- Patient is a 55y.o. male withh/ostage III squamous cell carcinoma of the left upper lobe T3 N1 M0.   He is here for on treatment assessment prior to cycle 10 of maintenance durvalumab and discuss CT scan results and further management  I have reviewed CT chest abdomen pelvis images independently and discussed findings with the patient.  Over the last 2 scans that has been slow growth in the size of  the left upper lobe lung nodule as well as right upper lobe lung nodule.  The dominant left upper lobe lung mass continues to shrink and is down from 3 x 2.1 cm to 2.7 x 2.3 cm.  There is also a new cavitary lesion in the right lower lobe measuring 1.5 cm.  No evidence of distant metastatic disease.  No evidence of locoregional adenopathy which the patient had at initial presentation.  Given the slow growth of these nodules but a consistent growth over the last 4 months it would be reasonable to consider SBRT to these areas before switching systemic treatment.  Patient is himself hesitant to pursue chemotherapy.  I will therefore refer him to radiation oncology at this time and continue durvalumab for now.  We will hold durvalumab once he starts radiation.  Counts are otherwise okay to proceed with cycle 10 of durvalumab today and I will see him back in 2 weeks tentatively for cycle 11  Fatigue: Likely secondary to inactivity as well as underlying COPD and lung cancer.  We will check ACTH cortisol and TSH at next visit to rule out durvalumab induced hypophysitis and  pituitary dysfunction   Visit Diagnosis 1. Malignant neoplasm of unspecified part of unspecified bronchus or lung ([place])   2. Abnormal TSH   3. Encounter for antineoplastic immunotherapy      Dr. Randa Evens, MD, MPH [facility] at [facility] [phone] 04/23/2021 9:43 AM

## 2021-04-23 NOTE — Patient Instructions (Signed)
[facility]  Discharge Instructions: Thank you for choosing [facility] to provide your oncology and hematology care.  If you have a lab appointment with the [facility], please go directly to the [facility] and check in at the registration area.  Wear comfortable clothing and clothing appropriate for easy access to any Portacath or PICC line.   We strive to give you quality time with your provider. You may need to reschedule your appointment if you arrive late (15 or more minutes).  Arriving late affects you and other patients whose appointments are after yours.  Also, if you miss three or more appointments without notifying the office, you may be dismissed from the clinic at the provider's discretion.      For prescription refill requests, have your pharmacy contact our office and allow 72 hours for refills to be completed.    Today you received the following chemotherapy and/or immunotherapy agents ImfinziDurvalumab injection What is this medicine? DURVALUMAB (dur VAL ue mab) is a monoclonal antibody. It is used to treat lung cancer. This medicine may be used for other purposes; ask your health care provider or pharmacist if you have questions. COMMON BRAND NAME(S): IMFINZI What should I tell my health care provider before I take this medicine? They need to know if you have any of these conditions: autoimmune diseases like Crohn's disease, ulcerative colitis, or lupus have had or planning to have an allogeneic stem cell transplant (uses someone else's stem cells) history of organ transplant history of radiation to the chest nervous system problems like myasthenia gravis or Guillain-Barre syndrome an unusual or allergic reaction to durvalumab, other medicines, foods, dyes, or preservatives pregnant or trying to get pregnant breast-feeding How should I use this medicine? This medicine is for infusion into a vein. It is given by a  health care professional in a hospital or clinic setting. A special MedGuide will be given to you before each treatment. Be sure to read this information carefully each time. Talk to your pediatrician regarding the use of this medicine in children. Special care may be needed. Overdosage: If you think you have taken too much of this medicine contact a poison control center or emergency room at once. NOTE: This medicine is only for you. Do not share this medicine with others. What if I miss a dose? It is important not to miss your dose. Call your doctor or health care professional if you are unable to keep an appointment. What may interact with this medicine? Interactions have not been studied. This list may not describe all possible interactions. Give your health care provider a list of all the medicines, herbs, non-prescription drugs, or dietary supplements you use. Also tell them if you smoke, drink alcohol, or use illegal drugs. Some items may interact with your medicine. What should I watch for while using this medicine? This drug may make you feel generally unwell. Continue your course of treatment even though you feel ill unless your doctor tells you to stop. You may need blood work done while you are taking this medicine. Do not become pregnant while taking this medicine or for 3 months after stopping it. Women should inform their doctor if they wish to become pregnant or think they might be pregnant. There is a potential for serious side effects to an unborn child. Talk to your health care professional or pharmacist for more information. Do not breast-feed an infant while taking this medicine or for 3 months  after stopping it. What side effects may I notice from receiving this medicine? Side effects that you should report to your doctor or health care professional as soon as possible: allergic reactions like skin rash, itching or hives, swelling of the face, lips, or tongue black, tarry  stools bloody or watery diarrhea breathing problems change in emotions or moods change in sex drive changes in vision chest pain or chest tightness chills confusion cough facial flushing fever headache signs and symptoms of high blood sugar such as dizziness; dry mouth; dry skin; fruity breath; nausea; stomach pain; increased hunger or thirst; increased urination signs and symptoms of liver injury like dark yellow or brown urine; general ill feeling or flu-like symptoms; light-colored stools; loss of appetite; nausea; right upper belly pain; unusually weak or tired; yellowing of the eyes or skin stomach pain trouble passing urine or change in the amount of urine weight gain or weight loss Side effects that usually do not require medical attention (report these to your doctor or health care professional if they continue or are bothersome): bone pain constipation loss of appetite muscle pain nausea swelling of the ankles, feet, hands tiredness This list may not describe all possible side effects. Call your doctor for medical advice about side effects. You may report side effects to FDA at 1-800-FDA-1088. Where should I keep my medicine? This drug is given in a hospital or clinic and will not be stored at home. NOTE: This sheet is a summary. It may not cover all possible information. If you have questions about this medicine, talk to your doctor, pharmacist, or health care provider.  2021 Elsevier/Gold Standard (2020-02-20 13:01:29)       To help prevent nausea and vomiting after your treatment, we encourage you to take your nausea medication as directed.  BELOW ARE SYMPTOMS THAT SHOULD BE REPORTED IMMEDIATELY: . *FEVER GREATER THAN 100.4 F (38 C) OR HIGHER . *CHILLS OR SWEATING . *NAUSEA AND VOMITING THAT IS NOT CONTROLLED WITH YOUR NAUSEA MEDICATION . *UNUSUAL SHORTNESS OF BREATH . *UNUSUAL BRUISING OR BLEEDING . *URINARY PROBLEMS (pain or burning when urinating, or frequent  urination) . *BOWEL PROBLEMS (unusual diarrhea, constipation, pain near the anus) . TENDERNESS IN MOUTH AND THROAT WITH OR WITHOUT PRESENCE OF ULCERS (sore throat, sores in mouth, or a toothache) . UNUSUAL RASH, SWELLING OR PAIN  . UNUSUAL VAGINAL DISCHARGE OR ITCHING   Items with * indicate a potential emergency and should be followed up as soon as possible or go to the Emergency Department if any problems should occur.  Please show the CHEMOTHERAPY ALERT CARD or IMMUNOTHERAPY ALERT CARD at check-in to the Emergency Department and triage nurse.  Should you have questions after your visit or need to cancel or reschedule your appointment, please contact [facility]  [phone] and follow the prompts.  Office hours are 8:00 a.m. to 4:30 p.m. Monday - Friday. Please note that voicemails left after 4:00 p.m. may not be returned until the following business day.  We are closed weekends and major holidays. You have access to a nurse at all times for urgent questions. Please call the main number to the clinic [phone] and follow the prompts.  For any non-urgent questions, you may also contact your provider using MyChart. We now offer e-Visits for anyone 61 and older to request care online for non-urgent symptoms. For details visit [email or web address].   Also download the MyChart app! Go to the app store, search "MyChart", open the app, select  [facility], and log in with your MyChart username and password.  Due to Covid, a mask is required upon entering the hospital/clinic. If you do not have a mask, one will be given to you upon arrival. For doctor visits, patients may have 1 support person aged 43 or older with them. For treatment visits, patients cannot have anyone with them due to current Covid guidelines and our immunocompromised population.

## 2021-04-26 ENCOUNTER — Ambulatory Visit
Admission: RE | Admit: 2021-04-26 | Discharge: 2021-04-26 | Disposition: A | Discharge status: Home / Self Care | Payer: Medicaid Other | Source: Ambulatory Visit | Attending: Radiation Oncology | Admitting: Radiation Oncology

## 2021-04-26 ENCOUNTER — Encounter: Payer: Self-pay | Admitting: Radiation Oncology

## 2021-04-26 VITALS — BP 112/78 | HR 79 | Temp 97.3°F | Wt 176.0 lb

## 2021-04-26 DIAGNOSIS — Z08 Encounter for follow-up examination after completed treatment for malignant neoplasm: Secondary | ICD-10-CM | POA: Diagnosis not present

## 2021-04-26 DIAGNOSIS — C3490 Malignant neoplasm of unspecified part of unspecified bronchus or lung: Secondary | ICD-10-CM

## 2021-04-26 DIAGNOSIS — R918 Other nonspecific abnormal finding of lung field: Secondary | ICD-10-CM | POA: Diagnosis not present

## 2021-04-26 DIAGNOSIS — C3412 Malignant neoplasm of upper lobe, left bronchus or lung: Secondary | ICD-10-CM | POA: Diagnosis not present

## 2021-04-26 NOTE — Progress Notes (Signed)
Radiation Oncology Follow up Note  Name: Wesley Ferguson   Date:   04/26/2021 MRN:  [id number] DOB: 1966-12-22    This 55 y.o. 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  REFERRING PROVIDER: No ref. provider found  HPI: Patient is a 55 year old male well-known to our apartment.  I saw him in consultation last month.  We are were tracking a cavitary thick-walled lesion in the right upper lobe suspicious for metachronous primary lung cancer.  He also has a dominant cavitary cystic mass in the left upper lobe showing increase in size currently on maintenance to durvalumab .  His recent CT scan last week showed interval enlargement of the right upper lobe cavity consistent with squamous cell carcinoma and a new cavitary nodule in the right lower lobe consistent with synchronous bronchogenic carcinoma versus metastatic disease.  Also had interval increase nodularity primary malignancy in the left upper lobe consistent with local disease progression.  I been asked to evaluate him for possible SBRT to these lesions.  He is doing fairly well he states he does have some shortness of breath and dyspnea on exertion no significant cough hemoptysis.  COMPLICATIONS OF TREATMENT: none  FOLLOW UP COMPLIANCE: keeps appointments   PHYSICAL EXAM:  BP 112/78   Pulse 79   Temp (!) 97.3 F (36.3 C) (Tympanic)   Wt 176 lb (79.8 kg)   SpO2 100% Comment: room air  BMI 22.60 kg/m  Well-developed well-nourished patient in NAD. HEENT reveals PERLA, EOMI, discs not visualized.  Oral cavity is clear. No oral mucosal lesions are identified. Neck is clear without evidence of cervical or supraclavicular adenopathy. Lungs are clear to A&P. Cardiac examination is essentially unremarkable with regular  rate and rhythm without murmur rub or thrill. Abdomen is benign with no organomegaly or masses noted. Motor sensory and DTR levels are equal and symmetric in the upper and lower extremities. Cranial nerves II through XII are grossly intact. Proprioception is intact. No peripheral adenopathy or edema is identified. No motor or sensory levels are noted. Crude visual fields are within normal range.  RADIOLOGY RESULTS: CT scans reviewed compatible with above-stated findings  PLAN: At this time I have ordered a PET CT scan to delineate exactly what were dealing with as far as malignancy in his chest and for better targeting of SBRT.  I would hopefully be able to treat for treat the 2 existing new lesions in his chest with 5 fractions of SBRT.  I have set up a PET CT scan and will see him for simulation shortly thereafter.  Risks and benefits of further radiation including low side effect profile for SBRT possible fatigue possible development of cough all were discussed in detail with the patient.  He comprehends my recommendations well.  I would like to take this opportunity to thank you for allowing me to participate in the care of your patient.Noreene Filbert, MD

## 2021-05-04 ENCOUNTER — Ambulatory Visit: Payer: Medicaid Other

## 2021-05-06 ENCOUNTER — Telehealth: Payer: Self-pay | Admitting: Pulmonary Disease

## 2021-05-06 IMAGING — MR MR HEAD WO/W CM
14 series · 47 of 48 positions shown · IV contrast (gadavist)
Comparison: Head CT 12/05/2014

CLINICAL DATA: Staging of non-small cell lung cancer.

EXAM:
MRI HEAD WITHOUT AND WITH CONTRAST
TECHNIQUE: Multiplanar, multiecho pulse sequences of the brain and surrounding
structures were obtained without and with intravenous contrast.
CONTRAST:  8mL GADAVIST GADOBUTROL 1 MMOL/ML IV SOLN

[Series 5: ax dwi_tracew · axial · 3.0mm · 0.60mm/px · z∈[-83,+72]mm · 4 of 48 slices shown]
[im 1/48]
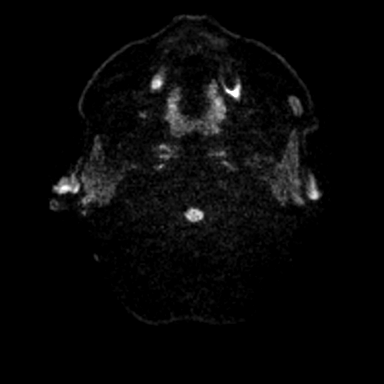
[im 16/48]
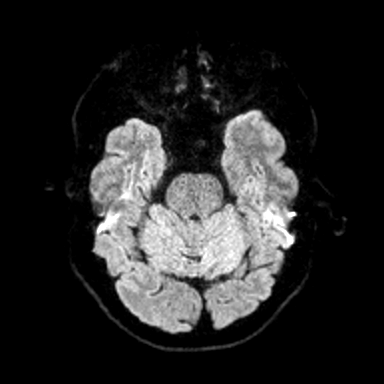
[im 32/48]
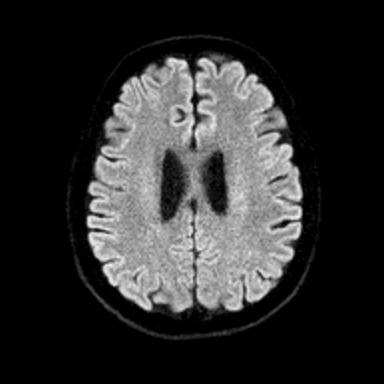
[im 48/48]
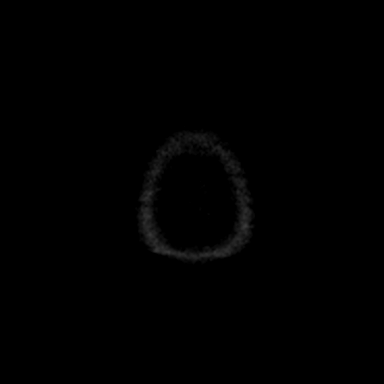

[Series 6: ax dwi_adc · axial · 3.0mm · 0.60mm/px · z∈[-83,+72]mm · 4 of 48 slices shown]
[im 1/48]
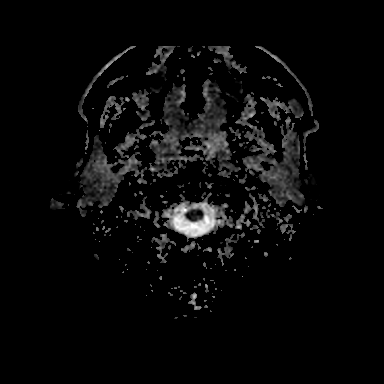
[im 16/48]
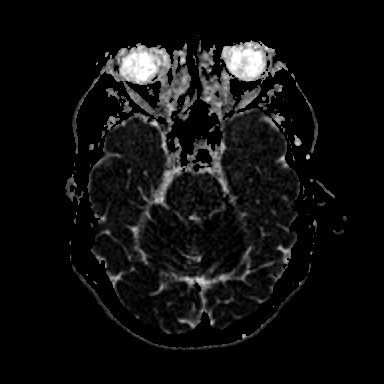
[im 32/48]
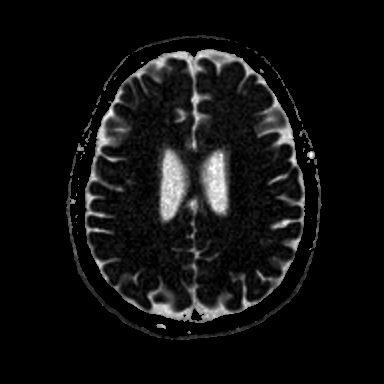
[im 48/48]
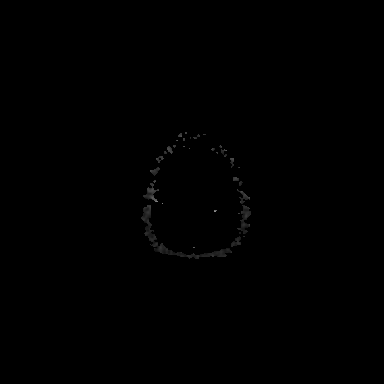

[Series 7: cor dwi_tracew · coronal · 5.0mm · 0.60mm/px · 2 of 40 slices shown]
[im 1/40]
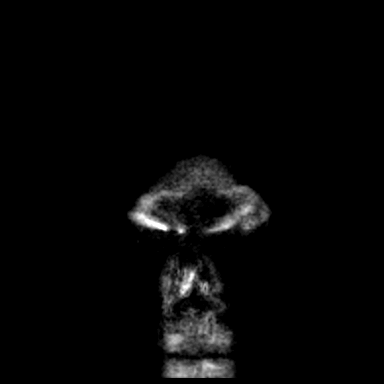
[im 40/40]
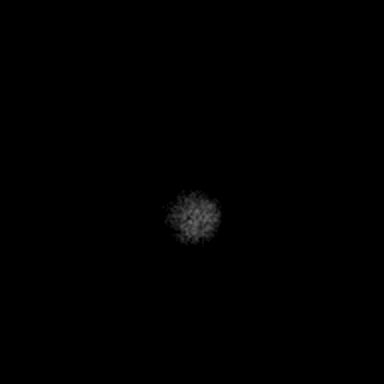

[Series 8: cor dwi_adc · coronal · 5.0mm · 0.60mm/px · 2 of 39 slices shown]
[im 1/39]
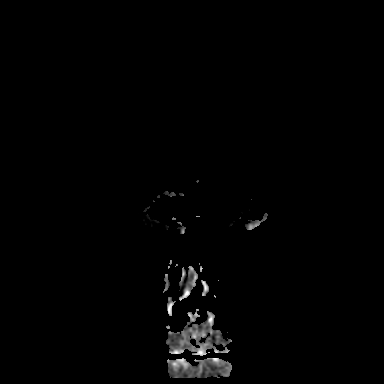
[im 39/39]
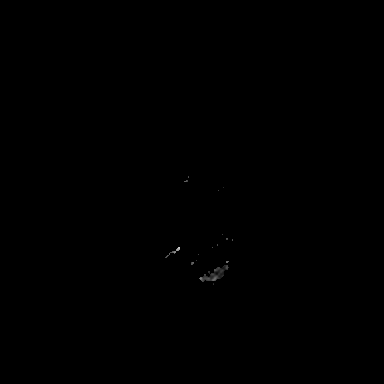

[Series 9: T1 · sagittal · 5.0mm · 0.62mm/px · 1 of 24 slices shown (1 of 2)]
[im 1/24]
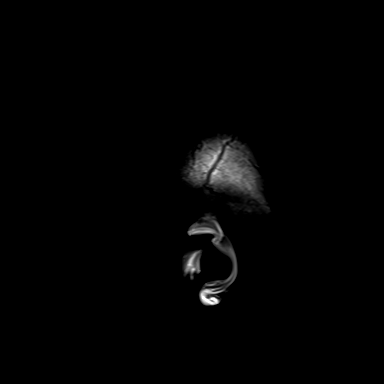

[Series 10: T2 · axial · 5.0mm · 0.53mm/px · 1 of 25 slices shown]
[im 1/25]
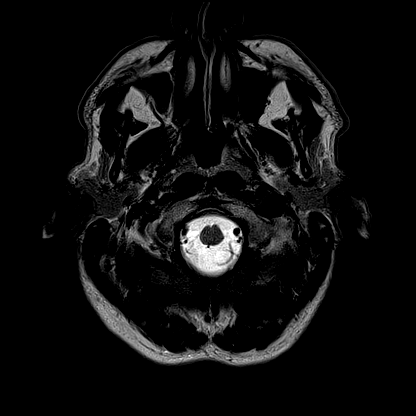

[Series 12: pha_images · axial · 3.0mm · 0.90mm/px · z∈[-90,+75]mm · 3 of 56 slices shown]
[im 1/56]
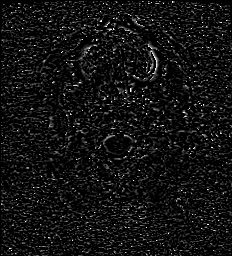
[im 28/56]
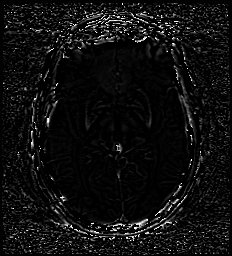
[im 56/56]
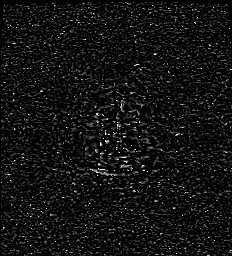

[Series 13: swi_images · axial · 3.0mm · 0.90mm/px · z∈[-90,+87]mm · 3 of 60 slices shown]
[im 1/60]
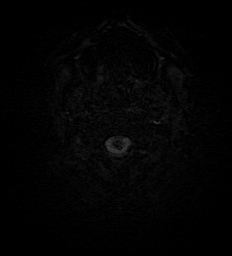
[im 30/60]
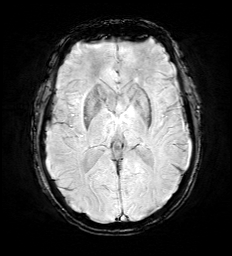
[im 60/60]
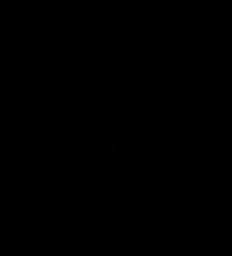

[Series 15: FLAIR · axial · 3.0mm · 0.53mm/px · z∈[-86,+76]mm · 3 of 55 slices shown]
[im 1/55]
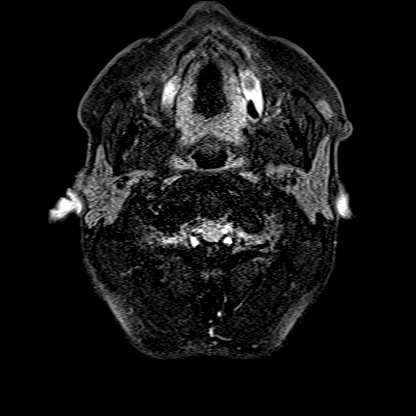
[im 28/55]
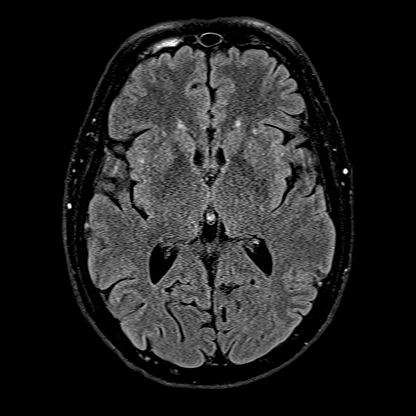
[im 55/55]
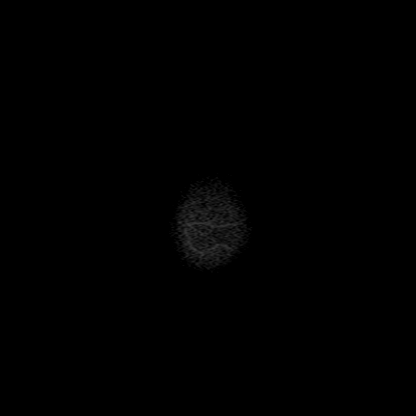

[Series 16: T1 · axial · 1.0mm · 0.98mm/px · z∈[-89,+83]mm · 9 of 173 slices shown (2 of 2)]
[im 1/173]
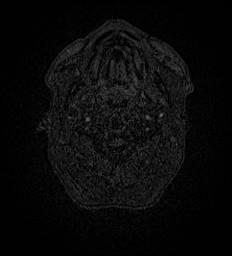
[im 20/173]
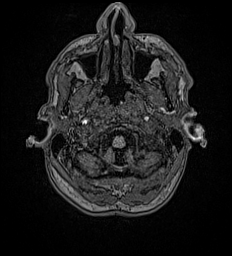
[im 39/173]
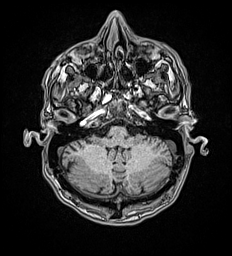
[im 58/173]
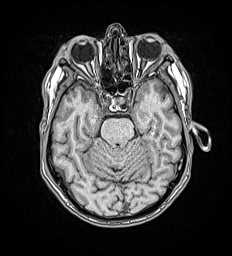
[im 77/173]
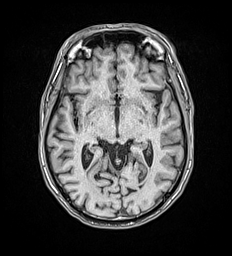
[im 96/173]
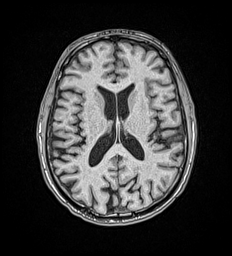
[im 115/173]
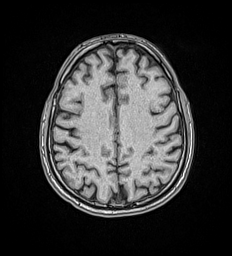
[im 153/173]
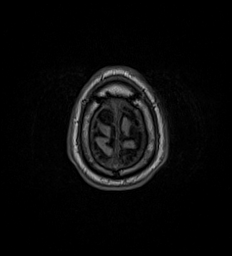
[im 173/173]
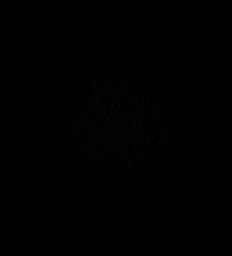

[Series 17: T2 post-contrast · coronal · 5.0mm · 0.57mm/px · 2 of 29 slices shown]
[im 1/29]
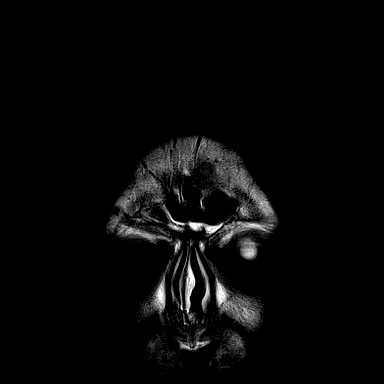
[im 29/29]
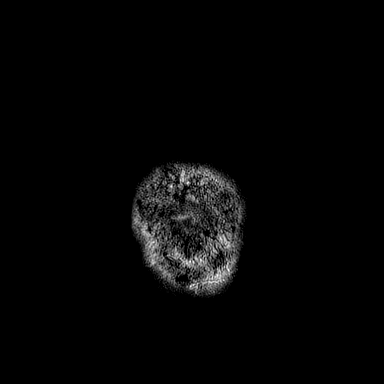

[Series 18: T1 post-contrast · axial · 1.0mm · 0.98mm/px · z∈[-89,+86]mm · 10 of 174 slices shown (1 of 3)]
[im 1/174]
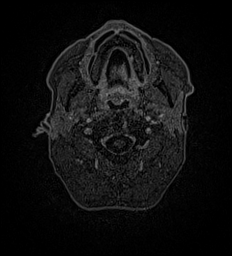
[im 20/174]
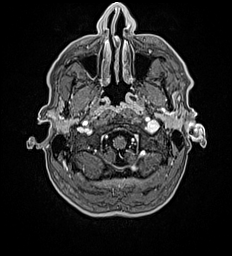
[im 39/174]
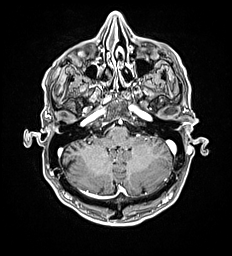
[im 58/174]
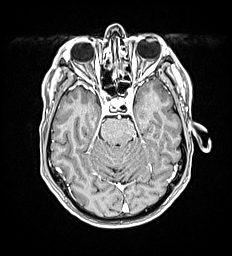
[im 77/174]
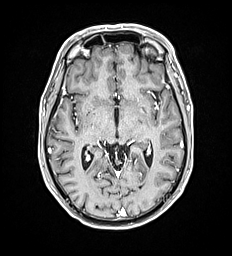
[im 97/174]
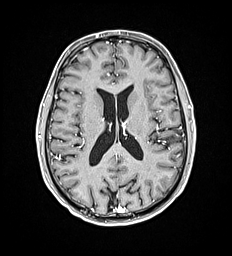
[im 116/174]
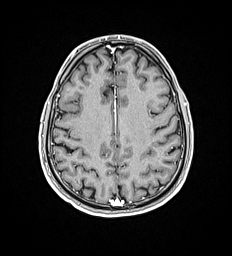
[im 135/174]
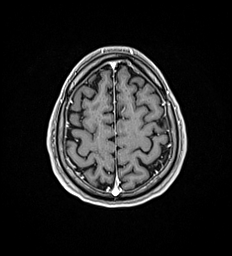
[im 154/174]
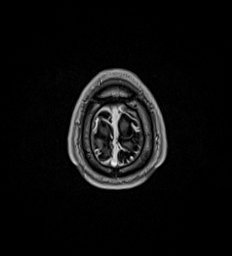
[im 174/174]
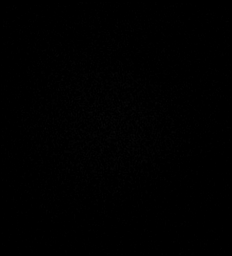

[Series 19: T1 post-contrast · coronal · 5.0mm · 0.57mm/px · 2 of 29 slices shown (2 of 3)]
[im 1/29]
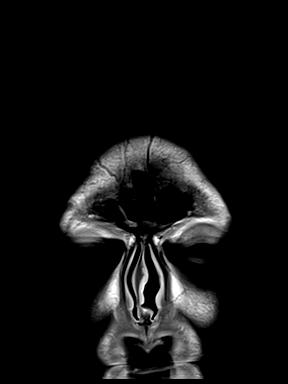
[im 29/29]
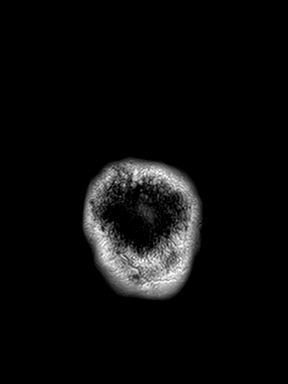

[Series 20: T1 post-contrast · sagittal · 5.0mm · 0.62mm/px · 1 of 24 slices shown (3 of 3)]
[im 1/24]
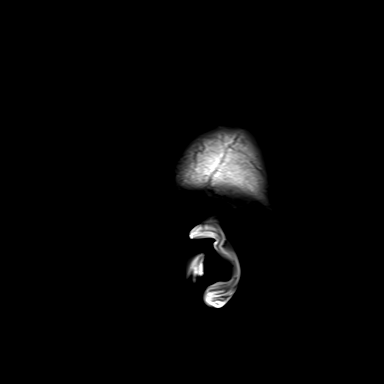

[47 of 48 positions shown; findings below may reference images not displayed]

FINDINGS: Brain: There is no evidence of an acute infarct, intracranial
hemorrhage, mass, midline shift, or extra-axial fluid collection.
The ventricles and sulci are normal. There is a subcentimeter
chronic cortical/subcortical infarct in the right middle frontal
gyrus. Scattered punctate foci of T2 hyperintensity elsewhere in the
cerebral white matter bilaterally are nonspecific but compatible
with minimal chronic small vessel ischemic disease. No abnormal
enhancement is identified.

Vascular: Major intracranial vascular flow voids are preserved.

Skull and upper cervical spine: Unremarkable bone marrow signal.

Sinuses/Orbits: Unremarkable orbits. Moderate bilateral ethmoid
sinus mucosal thickening. Clear mastoid air cells.

Other: None.
IMPRESSION: No evidence of intracranial metastases.

## 2021-05-06 NOTE — Telephone Encounter (Signed)
Patient is aware of date/time of covid test prior to PFT.  

## 2021-05-07 ENCOUNTER — Other Ambulatory Visit: Payer: Self-pay

## 2021-05-07 ENCOUNTER — Inpatient Hospital Stay: Payer: Medicaid Other | Attending: Oncology

## 2021-05-07 ENCOUNTER — Inpatient Hospital Stay: Payer: Medicaid Other

## 2021-05-07 ENCOUNTER — Inpatient Hospital Stay (HOSPITAL_BASED_OUTPATIENT_CLINIC_OR_DEPARTMENT_OTHER): Payer: Medicaid Other | Admitting: Oncology

## 2021-05-07 VITALS — Resp 18

## 2021-05-07 VITALS — BP 109/76 | HR 72 | Temp 97.2°F | Wt 179.6 lb

## 2021-05-07 DIAGNOSIS — Z833 Family history of diabetes mellitus: Secondary | ICD-10-CM | POA: Insufficient documentation

## 2021-05-07 DIAGNOSIS — Z8249 Family history of ischemic heart disease and other diseases of the circulatory system: Secondary | ICD-10-CM | POA: Diagnosis not present

## 2021-05-07 DIAGNOSIS — R590 Localized enlarged lymph nodes: Secondary | ICD-10-CM | POA: Insufficient documentation

## 2021-05-07 DIAGNOSIS — G893 Neoplasm related pain (acute) (chronic): Secondary | ICD-10-CM | POA: Diagnosis not present

## 2021-05-07 DIAGNOSIS — Z801 Family history of malignant neoplasm of trachea, bronchus and lung: Secondary | ICD-10-CM | POA: Diagnosis not present

## 2021-05-07 DIAGNOSIS — Z7289 Other problems related to lifestyle: Secondary | ICD-10-CM | POA: Insufficient documentation

## 2021-05-07 DIAGNOSIS — R0789 Other chest pain: Secondary | ICD-10-CM | POA: Diagnosis not present

## 2021-05-07 DIAGNOSIS — Z79899 Other long term (current) drug therapy: Secondary | ICD-10-CM | POA: Diagnosis not present

## 2021-05-07 DIAGNOSIS — R7989 Other specified abnormal findings of blood chemistry: Secondary | ICD-10-CM

## 2021-05-07 DIAGNOSIS — D1803 Hemangioma of intra-abdominal structures: Secondary | ICD-10-CM | POA: Insufficient documentation

## 2021-05-07 DIAGNOSIS — R5383 Other fatigue: Secondary | ICD-10-CM | POA: Insufficient documentation

## 2021-05-07 DIAGNOSIS — Z5112 Encounter for antineoplastic immunotherapy: Secondary | ICD-10-CM | POA: Insufficient documentation

## 2021-05-07 DIAGNOSIS — F17210 Nicotine dependence, cigarettes, uncomplicated: Secondary | ICD-10-CM | POA: Diagnosis not present

## 2021-05-07 DIAGNOSIS — R0602 Shortness of breath: Secondary | ICD-10-CM | POA: Insufficient documentation

## 2021-05-07 DIAGNOSIS — C3412 Malignant neoplasm of upper lobe, left bronchus or lung: Secondary | ICD-10-CM | POA: Insufficient documentation

## 2021-05-07 DIAGNOSIS — C3490 Malignant neoplasm of unspecified part of unspecified bronchus or lung: Secondary | ICD-10-CM

## 2021-05-07 DIAGNOSIS — Z28310 Unvaccinated for covid-19: Secondary | ICD-10-CM | POA: Diagnosis not present

## 2021-05-07 DIAGNOSIS — J984 Other disorders of lung: Secondary | ICD-10-CM | POA: Insufficient documentation

## 2021-05-07 LAB — COMPREHENSIVE METABOLIC PANEL
ALT: 12 U/L (ref 0–44)
AST: 20 U/L (ref 15–41)
Albumin: 3.8 g/dL (ref 3.5–5.0)
Alkaline Phosphatase: 66 U/L (ref 38–126)
Anion gap: 9 (ref 5–15)
BUN: 7 mg/dL (ref 6–20)
CO2: 24 mmol/L (ref 22–32)
Calcium: 9 mg/dL (ref 8.9–10.3)
Chloride: 102 mmol/L (ref 98–111)
Creatinine, Ser: 1.02 mg/dL (ref 0.61–1.24)
GFR, Estimated: >60 mL/min (ref >60)
Glucose, Bld: 118 mg/dL — ABNORMAL HIGH (ref 70–99)
Potassium: 4.2 mmol/L (ref 3.5–5.1)
Sodium: 135 mmol/L (ref 135–145)
Total Bilirubin: 0.4 mg/dL (ref 0.3–1.2)
Total Protein: 7.2 g/dL (ref 6.5–8.1)

## 2021-05-07 LAB — CBC WITH DIFFERENTIAL/PLATELET
Abs Immature Granulocytes: 0.04 10*3/uL (ref 0.00–0.07)
Basophils Absolute: 0.1 10*3/uL (ref 0.0–0.1)
Basophils Relative: 1 %
Eosinophils Absolute: 0.5 10*3/uL (ref 0.0–0.5)
Eosinophils Relative: 6 %
HCT: 41 % (ref 39.0–52.0)
Hemoglobin: 13.8 g/dL (ref 13.0–17.0)
Immature Granulocytes: 1 %
Lymphocytes Relative: 14 %
Lymphs Abs: 1.1 10*3/uL (ref 0.7–4.0)
MCH: 30.5 pg (ref 26.0–34.0)
MCHC: 33.7 g/dL (ref 30.0–36.0)
MCV: 90.5 fL (ref 80.0–100.0)
Monocytes Absolute: 0.7 10*3/uL (ref 0.1–1.0)
Monocytes Relative: 10 %
Neutro Abs: 5 10*3/uL (ref 1.7–7.7)
Neutrophils Relative %: 68 %
Platelets: 282 10*3/uL (ref 150–400)
RBC: 4.53 MIL/uL (ref 4.22–5.81)
RDW: 13.6 % (ref 11.5–15.5)
WBC: 7.4 10*3/uL (ref 4.0–10.5)
nRBC: 0 % (ref 0.0–0.2)

## 2021-05-07 LAB — T4, FREE: Free T4: 0.64 ng/dL (ref 0.61–1.12)

## 2021-05-07 LAB — TSH: TSH: 10.148 u[IU]/mL — ABNORMAL HIGH (ref 0.350–4.500)

## 2021-05-07 LAB — CORTISOL: Cortisol, Plasma: 9.4 ug/dL

## 2021-05-07 MED ORDER — SODIUM CHLORIDE 0.9 % IV SOLN
Freq: Once | INTRAVENOUS | Status: AC
  Filled 2021-05-07: qty 250

## 2021-05-07 MED ORDER — OXYCODONE HCL 5 MG PO TABS: 5.0000 mg | ORAL_TABLET | ORAL | 0 refills | Status: DC | PRN

## 2021-05-07 MED ORDER — SODIUM CHLORIDE 0.9 % IV SOLN
10.0000 mg/kg | Freq: Once | INTRAVENOUS | Status: AC
  Administered 2021-05-07: 860 mg via INTRAVENOUS
  Filled 2021-05-07: qty 10

## 2021-05-07 NOTE — Patient Instructions (Signed)
[facility]    Discharge Instructions:  Thank you for choosing [facility] to provide your oncology and hematology care.  If you have a lab appointment with the [facility], please go directly to the [facility] and check in at the registration area.  Wear comfortable clothing and clothing appropriate for easy access to any Portacath or PICC line.   We strive to give you quality time with your provider. You may need to reschedule your appointment if you arrive late (15 or more minutes).  Arriving late affects you and other patients whose appointments are after yours.  Also, if you miss three or more appointments without notifying the office, you may be dismissed from the clinic at the provider's discretion.      For prescription refill requests, have your pharmacy contact our office and allow 72 hours for refills to be completed.    Today you received the following chemotherapy and/or immunotherapy agents: Durvalumab (Imfinzi).      To help prevent nausea and vomiting after your treatment, we encourage you to take your nausea medication as directed.  BELOW ARE SYMPTOMS THAT SHOULD BE REPORTED IMMEDIATELY: . *FEVER GREATER THAN 100.4 F (38 C) OR HIGHER . *CHILLS OR SWEATING . *NAUSEA AND VOMITING THAT IS NOT CONTROLLED WITH YOUR NAUSEA MEDICATION . *UNUSUAL SHORTNESS OF BREATH . *UNUSUAL BRUISING OR BLEEDING . *URINARY PROBLEMS (pain or burning when urinating, or frequent urination) . *BOWEL PROBLEMS (unusual diarrhea, constipation, pain near the anus) . TENDERNESS IN MOUTH AND THROAT WITH OR WITHOUT PRESENCE OF ULCERS (sore throat, sores in mouth, or a toothache) . UNUSUAL RASH, SWELLING OR PAIN  . UNUSUAL VAGINAL DISCHARGE OR ITCHING   Items with * indicate a potential emergency and should be followed up as soon as possible or go to the Emergency Department if any problems should occur.  Please show the CHEMOTHERAPY ALERT CARD or  IMMUNOTHERAPY ALERT CARD at check-in to the Emergency Department and triage nurse.  Should you have questions after your visit or need to cancel or reschedule your appointment, please contact [facility]  [phone] and follow the prompts.  Office hours are 8:00 a.m. to 4:30 p.m. Monday - Friday. Please note that voicemails left after 4:00 p.m. may not be returned until the following business day.  We are closed weekends and major holidays. You have access to a nurse at all times for urgent questions. Please call the main number to the clinic [phone] and follow the prompts.  For any non-urgent questions, you may also contact your provider using MyChart. We now offer e-Visits for anyone 40 and older to request care online for non-urgent symptoms. For details visit [email or web address].   Also download the MyChart app! Go to the app store, search "MyChart", open the app, select [facility], and log in with your MyChart username and password.  Due to Covid, a mask is required upon entering the hospital/clinic. If you do not have a mask, one will be given to you upon arrival. For doctor visits, patients may have 1 support person aged 29 or older with them. For treatment visits, patients cannot have anyone with them due to current Covid guidelines and our immunocompromised population.   Durvalumab injection  What is this medicine? DURVALUMAB (dur VAL ue mab) is a monoclonal antibody. It is used to treat lung cancer. This medicine may be used for other purposes; ask your health care provider or pharmacist if you have  questions. COMMON BRAND NAME(S): IMFINZI What should I tell my health care provider before I take this medicine? They need to know if you have any of these conditions:  autoimmune diseases like Crohn's disease, ulcerative colitis, or lupus  have had or planning to have an allogeneic stem cell transplant (uses someone else's stem  cells)  history of organ transplant  history of radiation to the chest  nervous system problems like myasthenia gravis or Guillain-Barre syndrome  an unusual or allergic reaction to durvalumab, other medicines, foods, dyes, or preservatives  pregnant or trying to get pregnant  breast-feeding How should I use this medicine? This medicine is for infusion into a vein. It is given by a health care professional in a hospital or clinic setting. A special MedGuide will be given to you before each treatment. Be sure to read this information carefully each time. Talk to your pediatrician regarding the use of this medicine in children. Special care may be needed. Overdosage: If you think you have taken too much of this medicine contact a poison control center or emergency room at once. NOTE: This medicine is only for you. Do not share this medicine with others. What if I miss a dose? It is important not to miss your dose. Call your doctor or health care professional if you are unable to keep an appointment. What may interact with this medicine? Interactions have not been studied. This list may not describe all possible interactions. Give your health care provider a list of all the medicines, herbs, non-prescription drugs, or dietary supplements you use. Also tell them if you smoke, drink alcohol, or use illegal drugs. Some items may interact with your medicine. What should I watch for while using this medicine? This drug may make you feel generally unwell. Continue your course of treatment even though you feel ill unless your doctor tells you to stop. You may need blood work done while you are taking this medicine. Do not become pregnant while taking this medicine or for 3 months after stopping it. Women should inform their doctor if they wish to become pregnant or think they might be pregnant. There is a potential for serious side effects to an unborn child. Talk to your health care professional or  pharmacist for more information. Do not breast-feed an infant while taking this medicine or for 3 months after stopping it. What side effects may I notice from receiving this medicine? Side effects that you should report to your doctor or health care professional as soon as possible:  allergic reactions like skin rash, itching or hives, swelling of the face, lips, or tongue  black, tarry stools  bloody or watery diarrhea  breathing problems  change in emotions or moods  change in sex drive  changes in vision  chest pain or chest tightness  chills  confusion  cough  facial flushing  fever  headache  signs and symptoms of high blood sugar such as dizziness; dry mouth; dry skin; fruity breath; nausea; stomach pain; increased hunger or thirst; increased urination  signs and symptoms of liver injury like dark yellow or brown urine; general ill feeling or flu-like symptoms; light-colored stools; loss of appetite; nausea; right upper belly pain; unusually weak or tired; yellowing of the eyes or skin  stomach pain  trouble passing urine or change in the amount of urine  weight gain or weight loss Side effects that usually do not require medical attention (report these to your doctor or health care professional if  they continue or are bothersome):  bone pain  constipation  loss of appetite  muscle pain  nausea  swelling of the ankles, feet, hands  tiredness This list may not describe all possible side effects. Call your doctor for medical advice about side effects. You may report side effects to FDA at 1-800-FDA-1088. Where should I keep my medicine? This drug is given in a hospital or clinic and will not be stored at home. NOTE: This sheet is a summary. It may not cover all possible information. If you have questions about this medicine, talk to your doctor, pharmacist, or health care provider.  2021 Elsevier/Gold Standard (2020-02-20 13:01:29)

## 2021-05-07 NOTE — Progress Notes (Signed)
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 He had a chest x-ray followed by a CT scan which showed a 5.7 x 4.4 cm left upper lobe mass along with left hilar adenopathy. There was a low-density 3.6 lesion noted in the right hepatic lobe which ultimately turned out to be a hemangioma on MRI liver. PET CT scan showed hypermetabolic activity in the left upper lobe and left hilar nodal tissue. Subcarinal and right paratracheal lymph nodes with mild hypermetabolic features.  Patient underwent bronchoscopies and biopsies.Left upper lobe biopsy was consistent with non-small cell carcinoma favor squamous cell carcinoma right subcarinal lymph node biopsy was negative for malignancy.  Plan is for concurrent chemoradiation with carbotaxol followed by maintenance durvalumab. Patient does not want to get a port placed. He is also refused Covid vaccination  NGS testing showed high tumor mutational burden. PD-L1 90%. MSI stable. Patient PIK3CAand notch2FCHO2 fusion, T p53  Interval history-patient reports baseline fatigue and exertional shortness of breath.  Also reports occasional chest wall pain for which she uses as needed oxycodone  ECOG PS- 1 Pain scale- 3 Opioid associated constipation-  no  Review of systems- Review of Systems  Constitutional: Positive for malaise/fatigue. Negative for chills, fever and weight loss.  HENT: Negative for congestion, ear discharge and nosebleeds.   Eyes: Negative for blurred vision.  Respiratory: Positive for shortness of breath. Negative for cough, hemoptysis, sputum production and wheezing.        Chest wall pain  Cardiovascular: Negative for chest pain, palpitations, orthopnea and claudication.  Gastrointestinal: Negative for abdominal pain, blood in stool, constipation, diarrhea, heartburn, melena, nausea and vomiting.  Genitourinary: Negative for dysuria, flank pain, frequency, hematuria and urgency.  Musculoskeletal: Negative for back pain, joint pain and myalgias.  Skin: Negative for rash.  Neurological: Negative for dizziness, tingling, focal weakness, seizures, weakness and headaches.  Endo/Heme/Allergies: Does not bruise/bleed easily.  Psychiatric/Behavioral: Negative for depression and suicidal ideas. The patient does not have insomnia.       No Known Allergies   Past Medical History:  Diagnosis Date  . COPD (chronic obstructive pulmonary disease) ([place])   . Emphysema of lung ([place])   . Lung cancer ([place]) 06/2020  . Pneumonia      Past Surgical History:  Procedure Laterality Date  . BACK SURGERY  2017   lumbar region herniated disc  . VIDEO BRONCHOSCOPY WITH ENDOBRONCHIAL NAVIGATION N/A 08/07/2020   Procedure: VIDEO BRONCHOSCOPY WITH ENDOBRONCHIAL NAVIGATION;  Surgeon: Tyler Pita, MD;  Location: [facility] ORS;  Service: Pulmonary;  Laterality: N/A;  . VIDEO BRONCHOSCOPY WITH ENDOBRONCHIAL ULTRASOUND N/A 08/07/2020   Procedure: VIDEO BRONCHOSCOPY WITH ENDOBRONCHIAL ULTRASOUND;  Surgeon: Tyler Pita, MD;  Location: [facility] ORS;  Service: Pulmonary;  Laterality: N/A;    Social History   Socioeconomic History  . Marital status: Divorced  Spouse name: Not on file  . Number of children: Not on file  . Years of  education: Not on file  . Highest education level: Not on file  Occupational History  . Not on file  Tobacco Use  . Smoking status: Current Every Day Smoker    Packs/day: 3.00    Years: 35.00    Pack years: 105.00    Types: Cigarettes  . Smokeless tobacco: Former Systems developer    Types: Chew  . Tobacco comment: 2PPD 01/19/2021  Vaping Use  . Vaping Use: Never used  Substance and Sexual Activity  . Alcohol use: Yes    Alcohol/week: 8.0 standard drinks    Types: 8 Cans of beer per week  . Drug use: No  . Sexual activity: Yes  Other Topics Concern  . Not on file  Social History Narrative  . Not on file   Social Determinants of Health   Financial Resource Strain: Not on file  Food Insecurity: Not on file  Transportation Needs: Not on file  Physical Activity: Not on file  Stress: Not on file  Social Connections: Not on file  Intimate Partner Violence: Not on file    Family History  Problem Relation Age of Onset  . Diabetes Father   . Lung cancer Father 26  . Heart attack Father   . Throat cancer Maternal Aunt      Current Outpatient Medications:  .  acetaminophen (TYLENOL) 500 MG tablet, Take 500-1,000 mg by mouth every 6 (six) hours as needed (for pain.)., Disp: , Rfl:  .  albuterol (VENTOLIN HFA) 108 (90 Base) MCG/ACT inhaler, Inhale 2 puffs into the lungs every 6 (six) hours as needed for wheezing or shortness of breath., Disp: 8 g, Rfl: 2 .  budesonide-formoterol (SYMBICORT) 160-4.5 MCG/ACT inhaler, Inhale 2 puffs into the lungs in the morning and at bedtime., Disp: 1 each, Rfl: 12 .  fentaNYL (DURAGESIC) 12 MCG/HR, Place 1 patch onto the skin every 3 (three) days. (Patient not taking: No sig reported), Disp: 10 patch, Rfl: 0 .  oxyCODONE (OXY IR/ROXICODONE) 5 MG immediate release tablet, Take 1 tablet (5 mg total) by mouth every 4 (four) hours as needed for severe pain., Disp: 120 tablet, Rfl: 0 .  Tiotropium Bromide Monohydrate (SPIRIVA RESPIMAT) 2.5 MCG/ACT AERS, Inhale 2  puffs into the lungs daily. (Patient not taking: No sig reported), Disp: 4 g, Rfl: 11  Physical exam:  Vitals:   05/07/21 0845  BP: 109/76  Pulse: 72  Temp: (!) 97.2 F (36.2 C)  TempSrc: Tympanic  SpO2: 100%  Weight: 179 lb 9.6 oz (81.5 kg)   Physical Exam Cardiovascular:     Rate and Rhythm: Normal rate and regular rhythm.     Heart sounds: Normal heart sounds.  Pulmonary:     Effort: Pulmonary effort is normal.     Breath sounds: Normal breath sounds.  Skin:    General: Skin is warm and dry.  Neurological:     Mental Status: He is alert and oriented to person, place, and time.      CMP Latest Ref Rng & Units 05/07/2021  Glucose 70 - 99 mg/dL 118(H)  BUN 6 - 20 mg/dL 7  Creatinine 0.61 - 1.24 mg/dL 1.02  Sodium 135 - 145 mmol/L 135  Potassium 3.5 - 5.1 mmol/L 4.2  Chloride 98 - 111 mmol/L 102  CO2 22 - 32 mmol/L 24  Calcium 8.9 - 10.3 mg/dL 9.0  Total Protein 6.5 - 8.1 g/dL 7.2  Total Bilirubin 0.3 - 1.2 mg/dL 0.4  Alkaline Phos 38 - 126 U/L 66  AST 15 - 41 U/L 20  ALT 0 - 44 U/L 12   CBC Latest Ref Rng & Units 05/07/2021  WBC 4.0 - 10.5 K/uL 7.4  Hemoglobin 13.0 - 17.0 g/dL 13.8  Hematocrit 39.0 - 52.0 % 41.0  Platelets 150 - 400 K/uL 282    No images are attached to the encounter.  CT CHEST ABDOMEN PELVIS W CONTRAST  Result Date: 04/21/2021 CLINICAL DATA:  Squamous cell carcinoma of the LEFT upper lobe. Stage 3. Maintenance chemotherapy ongoing. EXAM: CT CHEST, ABDOMEN, AND PELVIS WITH CONTRAST TECHNIQUE: Multidetector CT imaging of the chest, abdomen and pelvis was performed following the standard protocol during bolus administration of intravenous contrast. CONTRAST:  1105mL OMNIPAQUE IOHEXOL 300 MG/ML  SOLN COMPARISON:  CT 02/12/2021 FINDINGS: CT CHEST FINDINGS Cardiovascular: No significant vascular findings. Normal heart size. No pericardial effusion. Mediastinum/Nodes: No axillary or supraclavicular adenopathy. No mediastinal or hilar adenopathy. No  pericardial fluid. Esophagus normal. Lungs/Pleura: Small cavitary lesion in the RIGHT upper lobe is increased in size measuring 1.4 cm in greatest dimension compared to 0.9 cm. Lesion is thick-walled typical of squamous cell carcinoma. Cavitary lesion in the LEFT upper lobe has solidified measuring 2.7 x 2.3 cm compared to 3.0 x 2.1 cm. Just medial dominant lesion is a new nodule measuring 1.7 cm (image 54/series 4) increased from small 1.0 cm lesion. New small cavitary lesion in the RIGHT lower lobe measures 0.5 cm on image 67/series 4. Musculoskeletal: No aggressive osseous lesion. CT ABDOMEN AND PELVIS FINDINGS Hepatobiliary: Stable enhancing lesion in the RIGHT hepatic lobe determine hemangioma. Enhancing lesion in the LEFT hepatic lobe (image 58/2) is also stable. No new hepatic lesions Pancreas: Pancreas is normal. No ductal dilatation. No pancreatic inflammation. Spleen: Normal spleen Adrenals/urinary tract: Adrenal glands and kidneys are normal. The ureters and bladder normal. Stomach/Bowel: Stomach, small bowel, appendix, and cecum are normal. The colon and rectosigmoid colon are normal. Vascular/Lymphatic: Abdominal aorta is normal caliber. There is no retroperitoneal or periportal lymphadenopathy. No pelvic lymphadenopathy. Reproductive: Unremarkable. Other: No peritoneal metastasis. Musculoskeletal: No aggressive osseous lesion. IMPRESSION: Chest Impression: 1. Interval enlargement thick-walled cavitary nodule in the RIGHT upper lobe consistent with squamous cell carcinoma. 2. New cavitary nodule in the RIGHT lower lobe consistent with synchronous bronchogenic carcinoma versus metastatic lesion. 3. Interval increase in nodularity at primary malignancy in LEFT upper lobe consistent with local disease progression. Abdomen / Pelvis Impression: 1. Stable enhancing lesions in the liver determined benign on comparison imaging. 2. No evidence of metastatic disease in the abdomen pelvis. Electronically Signed    By: Suzy Bouchard M.D.   On: 04/21/2021 14:32     Assessment and plan- Patient is a 56 y.o. male withh/ostage III squamous cell carcinoma of the left upper lobe T3 N1 M0.   He is here for on treatment assessment prior to cycle 11 of maintenance durvalumab  Counts okay to proceed with cycle 11 of maintenance durvalumab today and I will see him back in 2 weeks for cycle 12.  Patient was noted to have a slow increase in the size of right upper lobe lung nodule as well as left upper lobe lung nodule but no other evidence of progressive or metastatic disease.  Patient was seen by radiation oncology and will be getting a PET CT scan next week followed by SBRT to these lesions.  Radiation will not start before 05/25/2021.  I would probably  push his cycle 13 of durvalumab by 1 more week  Neoplasm related pain: Continue as needed oxycodone.  Prescription refill today  Fatigue: Likely secondary to underlying malignancy/immunotherapy.  ACTH cortisol and TSH ordered today   Visit Diagnosis 1. Encounter for antineoplastic immunotherapy   2. Malignant neoplasm of unspecified part of unspecified bronchus or lung ([place])      Dr. Randa Evens, MD, MPH [facility] [phone] 05/07/2021 8:46 AM               See

## 2021-05-08 LAB — ACTH: C206 ACTH: 18.1 pg/mL (ref 7.2–63.3)

## 2021-05-10 ENCOUNTER — Other Ambulatory Visit: Payer: Self-pay | Admitting: *Deleted

## 2021-05-10 DIAGNOSIS — R7989 Other specified abnormal findings of blood chemistry: Secondary | ICD-10-CM

## 2021-05-10 DIAGNOSIS — C3490 Malignant neoplasm of unspecified part of unspecified bronchus or lung: Secondary | ICD-10-CM

## 2021-05-11 ENCOUNTER — Other Ambulatory Visit: Payer: Self-pay

## 2021-05-11 ENCOUNTER — Other Ambulatory Visit
Admission: RE | Admit: 2021-05-11 | Discharge: 2021-05-11 | Disposition: A | Discharge status: Home / Self Care | Payer: Medicaid Other | Source: Ambulatory Visit | Attending: Pulmonary Disease | Admitting: Pulmonary Disease

## 2021-05-11 DIAGNOSIS — Z20822 Contact with and (suspected) exposure to covid-19: Secondary | ICD-10-CM | POA: Diagnosis not present

## 2021-05-11 DIAGNOSIS — Z01812 Encounter for preprocedural laboratory examination: Secondary | ICD-10-CM | POA: Diagnosis present

## 2021-05-11 LAB — SARS CORONAVIRUS 2 (TAT 6-24 HRS): SARS Coronavirus 2: NEGATIVE

## 2021-05-12 ENCOUNTER — Other Ambulatory Visit: Payer: Self-pay

## 2021-05-12 ENCOUNTER — Ambulatory Visit: Payer: Medicaid Other | Attending: Pulmonary Disease

## 2021-05-12 DIAGNOSIS — F17210 Nicotine dependence, cigarettes, uncomplicated: Secondary | ICD-10-CM | POA: Insufficient documentation

## 2021-05-12 DIAGNOSIS — J449 Chronic obstructive pulmonary disease, unspecified: Secondary | ICD-10-CM | POA: Diagnosis not present

## 2021-05-12 MED ORDER — ALBUTEROL SULFATE (2.5 MG/3ML) 0.083% IN NEBU
2.5000 mg | INHALATION_SOLUTION | Freq: Once | RESPIRATORY_TRACT | Status: AC
  Administered 2021-05-12: 2.5 mg via RESPIRATORY_TRACT
  Filled 2021-05-12: qty 3

## 2021-05-13 ENCOUNTER — Ambulatory Visit
Admission: RE | Admit: 2021-05-13 | Discharge: 2021-05-13 | Disposition: A | Discharge status: Home / Self Care | Payer: Medicaid Other | Source: Ambulatory Visit | Attending: Radiation Oncology | Admitting: Radiation Oncology

## 2021-05-13 DIAGNOSIS — J984 Other disorders of lung: Secondary | ICD-10-CM | POA: Insufficient documentation

## 2021-05-13 DIAGNOSIS — C3490 Malignant neoplasm of unspecified part of unspecified bronchus or lung: Secondary | ICD-10-CM | POA: Insufficient documentation

## 2021-05-13 LAB — GLUCOSE, CAPILLARY: Glucose-Capillary: 70 mg/dL (ref 70–99)

## 2021-05-13 MED ORDER — FLUDEOXYGLUCOSE F - 18 (FDG) INJECTION
9.3000 | Freq: Once | INTRAVENOUS | Status: AC | PRN
  Administered 2021-05-13: 10 via INTRAVENOUS

## 2021-05-17 ENCOUNTER — Ambulatory Visit: Payer: Medicaid Other

## 2021-05-17 DIAGNOSIS — C3412 Malignant neoplasm of upper lobe, left bronchus or lung: Secondary | ICD-10-CM | POA: Diagnosis not present

## 2021-05-17 DIAGNOSIS — Z87891 Personal history of nicotine dependence: Secondary | ICD-10-CM | POA: Diagnosis not present

## 2021-05-18 ENCOUNTER — Other Ambulatory Visit: Payer: Self-pay

## 2021-05-18 MED ORDER — SPIRIVA RESPIMAT 2.5 MCG/ACT IN AERS: 2.0000 | INHALATION_SPRAY | Freq: Every day | RESPIRATORY_TRACT | 11 refills | Status: DC

## 2021-05-19 DIAGNOSIS — Z87891 Personal history of nicotine dependence: Secondary | ICD-10-CM | POA: Diagnosis not present

## 2021-05-19 DIAGNOSIS — C3412 Malignant neoplasm of upper lobe, left bronchus or lung: Secondary | ICD-10-CM | POA: Diagnosis not present

## 2021-05-21 ENCOUNTER — Inpatient Hospital Stay: Payer: Medicaid Other | Admitting: Oncology

## 2021-05-21 ENCOUNTER — Inpatient Hospital Stay: Payer: Medicaid Other

## 2021-05-25 ENCOUNTER — Ambulatory Visit
Admission: RE | Admit: 2021-05-25 | Discharge: 2021-05-25 | Disposition: A | Discharge status: Home / Self Care | Payer: Medicaid Other | Source: Ambulatory Visit | Attending: Radiation Oncology | Admitting: Radiation Oncology

## 2021-05-25 DIAGNOSIS — C3412 Malignant neoplasm of upper lobe, left bronchus or lung: Secondary | ICD-10-CM | POA: Diagnosis not present

## 2021-05-27 ENCOUNTER — Ambulatory Visit
Admission: RE | Admit: 2021-05-27 | Discharge: 2021-05-27 | Disposition: A | Discharge status: Home / Self Care | Payer: Medicaid Other | Source: Ambulatory Visit | Attending: Radiation Oncology | Admitting: Radiation Oncology

## 2021-05-27 DIAGNOSIS — C3412 Malignant neoplasm of upper lobe, left bronchus or lung: Secondary | ICD-10-CM | POA: Insufficient documentation

## 2021-05-28 ENCOUNTER — Inpatient Hospital Stay: Payer: Medicaid Other

## 2021-05-28 ENCOUNTER — Other Ambulatory Visit: Payer: Self-pay

## 2021-05-28 ENCOUNTER — Inpatient Hospital Stay: Payer: Medicaid Other | Attending: Oncology | Admitting: Oncology

## 2021-05-28 DIAGNOSIS — C3490 Malignant neoplasm of unspecified part of unspecified bronchus or lung: Secondary | ICD-10-CM | POA: Diagnosis not present

## 2021-05-28 DIAGNOSIS — G893 Neoplasm related pain (acute) (chronic): Secondary | ICD-10-CM

## 2021-05-28 MED ORDER — OXYCODONE HCL 5 MG PO TABS: 5.0000 mg | ORAL_TABLET | ORAL | 0 refills | Status: DC | PRN

## 2021-05-28 NOTE — Progress Notes (Signed)
Hematology/Oncology Consult note [facility]  [phone] [phone]  Patient Care Team: Patient, No Pcp Per (Inactive) as PCP - General ([place]) Telford Nab, RN as Oncology Nurse Navigator Sindy Guadeloupe, MD as Consulting Physician (Hematology and Oncology)   Name of the patient: Wesley Ferguson  [id number]  22-Oct-1966   Date of visit: 05/28/21   I connected with Wesley Ferguson on 05/28/21 at  9:30 AM EDT by telephone visit and verified that I am speaking with the correct person using two identifiers.   I discussed the limitations, risks, security and privacy concerns of performing an evaluation and management service by telemedicine and the availability of in-person appointments. I also discussed with the patient that there may be a patient responsible charge related to this service. The patient expressed understanding and agreed to proceed.   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 He had a chest x-ray followed by a CT scan which showed a 5.7 x 4.4 cm left upper lobe mass along with left hilar adenopathy. There was a low-density 3.6 lesion noted in the right hepatic lobe which ultimately turned out to be a hemangioma on MRI liver. PET CT scan showed hypermetabolic activity in the left upper lobe and left hilar nodal tissue. Subcarinal and right paratracheal lymph nodes with mild hypermetabolic features.  Patient underwent bronchoscopies and biopsies.Left upper lobe biopsy was  consistent with non-small cell carcinoma favor squamous cell carcinoma right subcarinal lymph node biopsy was negative for malignancy.  Plan is for concurrent chemoradiation with carbotaxol followed by maintenance durvalumab. Patient does not want to get a port placed. He is also refused Covid vaccination  NGS testing showed high tumor mutational burden. PD-L1 90%. MSI stable. Patient PIK3CAand notch2FCHO2 fusion, T p53  Interval history-reports feeling fairly stable today.  He started XRT on Tuesday and received second dose yesterday.  Has occasional chest pain and is taking oxycodone as needed.  Shortness of breath with exertion.  ECOG PS- 1 Pain scale- 3 Opioid associated constipation- no  Review of systems- Review of Systems  Constitutional: Positive for malaise/fatigue. Negative for chills, fever and weight loss.  HENT: Negative for congestion, ear pain and tinnitus.   Eyes: Negative.  Negative for blurred vision and double vision.  Respiratory: Positive for shortness of breath. Negative for cough and sputum production.   Cardiovascular: Negative.  Negative for chest pain, palpitations and leg swelling.  Gastrointestinal: Negative.  Negative for abdominal pain, constipation, diarrhea, nausea and vomiting.  Genitourinary: Negative for dysuria, frequency and urgency.  Musculoskeletal: Negative for back pain and falls.  Skin: Negative.  Negative for rash.  Neurological: Negative.  Negative for weakness and headaches.  Endo/Heme/Allergies: Negative.  Does not bruise/bleed easily.  Psychiatric/Behavioral: Negative.  Negative for depression. The patient is not nervous/anxious and does not have insomnia.       No Known Allergies   Past Medical History:  Diagnosis Date  . COPD (chronic obstructive pulmonary disease) ([place])   . Emphysema of lung ([place])   . Lung  cancer ([place]) 06/2020  . Pneumonia      Past Surgical History:  Procedure Laterality Date  . BACK SURGERY  2017    lumbar region herniated disc  . VIDEO BRONCHOSCOPY WITH ENDOBRONCHIAL NAVIGATION N/A 08/07/2020   Procedure: VIDEO BRONCHOSCOPY WITH ENDOBRONCHIAL NAVIGATION;  Surgeon: Tyler Pita, MD;  Location: [facility] ORS;  Service: Pulmonary;  Laterality: N/A;  . VIDEO BRONCHOSCOPY WITH ENDOBRONCHIAL ULTRASOUND N/A 08/07/2020   Procedure: VIDEO BRONCHOSCOPY WITH ENDOBRONCHIAL ULTRASOUND;  Surgeon: Tyler Pita, MD;  Location: [facility] ORS;  Service: Pulmonary;  Laterality: N/A;    Social History   Socioeconomic History  . Marital status: Divorced    Spouse name: Not on file  . Number of children: Not on file  . Years of education: Not on file  . Highest education level: Not on file  Occupational History  . Not on file  Tobacco Use  . Smoking status: Current Every Day Smoker    Packs/day: 3.00    Years: 35.00    Pack years: 105.00    Types: Cigarettes  . Smokeless tobacco: Former Systems developer    Types: Chew  . Tobacco comment: 2PPD 01/19/2021  Vaping Use  . Vaping Use: Never used  Substance and Sexual Activity  . Alcohol use: Yes    Alcohol/week: 8.0 standard drinks    Types: 8 Cans of beer per week  . Drug use: No  . Sexual activity: Yes  Other Topics Concern  . Not on file  Social History Narrative  . Not on file   Social Determinants of Health   Financial Resource Strain: Not on file  Food Insecurity: Not on file  Transportation Needs: Not on file  Physical Activity: Not on file  Stress: Not on file  Social Connections: Not on file  Intimate Partner Violence: Not on file    Family History  Problem Relation Age of Onset  . Diabetes Father   . Lung cancer Father 20  . Heart attack Father   . Throat cancer Maternal Aunt      Current Outpatient Medications:  .  acetaminophen (TYLENOL) 500 MG tablet, Take 500-1,000 mg by mouth every 6 (six) hours as needed (for pain.)., Disp: , Rfl:  .  albuterol (VENTOLIN HFA) 108 (90 Base) MCG/ACT inhaler, Inhale 2 puffs into the lungs  every 6 (six) hours as needed for wheezing or shortness of breath., Disp: 8 g, Rfl: 2 .  budesonide-formoterol (SYMBICORT) 160-4.5 MCG/ACT inhaler, Inhale 2 puffs into the lungs in the morning and at bedtime., Disp: 1 each, Rfl: 12 .  fentaNYL (DURAGESIC) 12 MCG/HR, Place 1 patch onto the skin every 3 (three) days., Disp: 10 patch, Rfl: 0 .  Tiotropium Bromide Monohydrate (SPIRIVA RESPIMAT) 2.5 MCG/ACT AERS, Inhale 2 puffs into the lungs daily., Disp: 4 g, Rfl: 11 .  oxyCODONE (OXY IR/ROXICODONE) 5 MG immediate release tablet, Take 1 tablet (5 mg total) by mouth every 4 (four) hours as needed for severe pain., Disp: 120 tablet, Rfl: 0  Physical exam:  There were no vitals filed for this visit. Physical Exam Cardiovascular:     Rate and Rhythm: Normal rate and regular rhythm.     Heart sounds: Normal heart sounds.  Pulmonary:     Effort: Pulmonary effort is normal.     Breath sounds: Normal breath sounds.  Skin:    General: Skin is warm and dry.  Neurological:     Mental Status: He is alert and oriented to person, place, and time.  CMP Latest Ref Rng & Units 05/07/2021  Glucose 70 - 99 mg/dL 118(H)  BUN 6 - 20 mg/dL 7  Creatinine 0.61 - 1.24 mg/dL 1.02  Sodium 135 - 145 mmol/L 135  Potassium 3.5 - 5.1 mmol/L 4.2  Chloride 98 - 111 mmol/L 102  CO2 22 - 32 mmol/L 24  Calcium 8.9 - 10.3 mg/dL 9.0  Total Protein 6.5 - 8.1 g/dL 7.2  Total Bilirubin 0.3 - 1.2 mg/dL 0.4  Alkaline Phos 38 - 126 U/L 66  AST 15 - 41 U/L 20  ALT 0 - 44 U/L 12   CBC Latest Ref Rng & Units 05/07/2021  WBC 4.0 - 10.5 K/uL 7.4  Hemoglobin 13.0 - 17.0 g/dL 13.8  Hematocrit 39.0 - 52.0 % 41.0  Platelets 150 - 400 K/uL 282    No images are attached to the encounter.  NM PET Image Restag (PS) Skull Base To Thigh  Result Date: 05/14/2021 CLINICAL DATA:  Subsequent treatment strategy for non-small cell lung cancer. EXAM: NUCLEAR MEDICINE PET SKULL BASE TO THIGH TECHNIQUE: 10.0 mCi F-18 FDG was injected  intravenously. Full-ring PET imaging was performed from the skull base to thigh after the radiotracer. CT data was obtained and used for attenuation correction and anatomic localization. Fasting blood glucose: 70 mg/dl COMPARISON:  PET-CT 08/11/2020, chest CT 04/21/2021 FINDINGS: Mediastinal blood pool activity: SUV max 2.6 Liver activity: SUV max 3.5 NECK: No hypermetabolic lymph nodes in the neck. Incidental CT findings: none CHEST: Elongated nodule in the LEFT upper lobe again demonstrated. Nodule has 3 components. The most central 19 mm component is hypermetabolic (image 39/0) with SUV max equal 8.9. The more peripheral nodular component measures 10 mm with SUV max equal 10.6. A central cystic component measuring 2.3 cm which is not hypermetabolic. The cavitary nodule in the RIGHT upper lobe measuring 18 mm and is intensely hypermetabolic with SUV max equal 7.8 The smaller cavitary nodule in the RIGHT lower lobe does not have clear metabolic activity (image 93) There are no hypermetabolic mediastinal lymph nodes. No hypermetabolic supraclavicular nodes or hilar nodes. Hypermetabolic axillary nodes Incidental CT findings: none ABDOMEN/PELVIS: No abnormal hypermetabolic activity within the liver, pancreas, adrenal glands, or spleen. No hypermetabolic lymph nodes in the abdomen or pelvis. Incidental CT findings: none SKELETON: No focal hypermetabolic activity to suggest skeletal metastasis. Incidental CT findings: none IMPRESSION: 1. Two hypermetabolic nodules in the LEFT upper lobe spanning a central cystic component at prior treatment site for large cavitary masses consistent with local recurrence of LEFT upper lobe lung carcinoma. 2. New hypermetabolic cavitary nodule in the RIGHT upper lobe is concerning for metachronous bronchogenic carcinoma 3. Small cavitary nodule in the RIGHT lower lobe is concerning for carcinoma but too small to characterize by PET imaging. 4. No hypermetabolic mediastinal or hilar  adenopathy. No supraclavicular adenopathy. No distant metastatic disease. Electronically Signed   By: Suzy Bouchard M.D.   On: 05/14/2021 15:15   Pulmonary Function Test [facility] Only  Result Date: 05/13/2021 Spirometry Data Is Acceptable and Reproducible Moderate/Severe  Obstructive Airways Disease without  Significant Broncho-Dilator Response +air trapping and +hyperinflation Consider outpatient Pulmonary Consultation if needed Clinical Correlation Advised     Assessment and plan- Patient is a 55 y.o. male withh/ostage III squamous cell carcinoma of the left upper lobe T3 N1 M0.   He is here for on treatment assessment prior to cycle 12 of maintenance durvalumab .  He met with Dr. Donella Stade and will receive 5 fractions of SBRT to both  the right and left lung.  He received his first treatment on 05/25/2021 and second treatment yesterday.  Discussed with radiation oncology and he will be getting treatment twice a week with his last treatment being on 06/29/2021.  Spoke with Dr. Tasia Catchings, who recommends holding durvalumab while receiving XRT d/t risk for pneumonitis.  Spoke with patient who is agreeable to hold off on treatment.  I will schedule him back to see Dr. Janese Banks approximately 1 week post XRT treatment.   Neoplasm related pain: Continue as needed oxycodone.  Prescription refill today.  Fatigue: Likely secondary to underlying malignancy/immunotherapy.  TSH elevated at 10.148 Free T4 normal.  ACTH cortisol level normal.   Re-chcek TSH prior to restarting in July.   Visit Diagnosis 1. Malignant neoplasm of unspecified part of unspecified bronchus or lung ([place])   2. Neoplasm related pain     I provided 15 minutes of non face-to-face telephone visit time during this encounter, and > 50% was spent counseling as documented under my assessment & plan.  Faythe Casa, NP 05/28/2021 3:58 PM

## 2021-06-01 ENCOUNTER — Ambulatory Visit
Admission: RE | Admit: 2021-06-01 | Discharge: 2021-06-01 | Disposition: A | Discharge status: Home / Self Care | Payer: Medicaid Other | Source: Ambulatory Visit | Attending: Radiation Oncology | Admitting: Radiation Oncology

## 2021-06-01 DIAGNOSIS — C3412 Malignant neoplasm of upper lobe, left bronchus or lung: Secondary | ICD-10-CM | POA: Diagnosis not present

## 2021-06-03 ENCOUNTER — Ambulatory Visit: Payer: Medicaid Other

## 2021-06-03 ENCOUNTER — Ambulatory Visit
Admission: RE | Admit: 2021-06-03 | Discharge: 2021-06-03 | Disposition: A | Discharge status: Home / Self Care | Payer: Medicaid Other | Source: Ambulatory Visit | Attending: Radiation Oncology | Admitting: Radiation Oncology

## 2021-06-03 DIAGNOSIS — C3412 Malignant neoplasm of upper lobe, left bronchus or lung: Secondary | ICD-10-CM | POA: Diagnosis not present

## 2021-06-08 ENCOUNTER — Ambulatory Visit
Admission: RE | Admit: 2021-06-08 | Discharge: 2021-06-08 | Disposition: A | Discharge status: Home / Self Care | Payer: Medicaid Other | Source: Ambulatory Visit | Attending: Radiation Oncology | Admitting: Radiation Oncology

## 2021-06-08 DIAGNOSIS — Z87891 Personal history of nicotine dependence: Secondary | ICD-10-CM | POA: Diagnosis not present

## 2021-06-08 DIAGNOSIS — C3412 Malignant neoplasm of upper lobe, left bronchus or lung: Secondary | ICD-10-CM | POA: Diagnosis not present

## 2021-06-14 DIAGNOSIS — Z87891 Personal history of nicotine dependence: Secondary | ICD-10-CM | POA: Diagnosis not present

## 2021-06-14 DIAGNOSIS — C3412 Malignant neoplasm of upper lobe, left bronchus or lung: Secondary | ICD-10-CM | POA: Diagnosis not present

## 2021-06-15 ENCOUNTER — Ambulatory Visit
Admission: RE | Admit: 2021-06-15 | Discharge: 2021-06-15 | Disposition: A | Discharge status: Home / Self Care | Payer: Medicaid Other | Source: Ambulatory Visit | Attending: Radiation Oncology | Admitting: Radiation Oncology

## 2021-06-15 DIAGNOSIS — C3412 Malignant neoplasm of upper lobe, left bronchus or lung: Secondary | ICD-10-CM | POA: Diagnosis not present

## 2021-06-17 ENCOUNTER — Ambulatory Visit
Admission: RE | Admit: 2021-06-17 | Discharge: 2021-06-17 | Disposition: A | Discharge status: Home / Self Care | Payer: Medicaid Other | Source: Ambulatory Visit | Attending: Radiation Oncology | Admitting: Radiation Oncology

## 2021-06-17 DIAGNOSIS — C3412 Malignant neoplasm of upper lobe, left bronchus or lung: Secondary | ICD-10-CM | POA: Diagnosis not present

## 2021-06-22 ENCOUNTER — Ambulatory Visit
Admission: RE | Admit: 2021-06-22 | Discharge: 2021-06-22 | Disposition: A | Discharge status: Home / Self Care | Payer: Medicaid Other | Source: Ambulatory Visit | Attending: Radiation Oncology | Admitting: Radiation Oncology

## 2021-06-22 DIAGNOSIS — C3412 Malignant neoplasm of upper lobe, left bronchus or lung: Secondary | ICD-10-CM | POA: Diagnosis not present

## 2021-06-24 ENCOUNTER — Ambulatory Visit
Admission: RE | Admit: 2021-06-24 | Discharge: 2021-06-24 | Disposition: A | Discharge status: Home / Self Care | Payer: Medicaid Other | Source: Ambulatory Visit | Attending: Radiation Oncology | Admitting: Radiation Oncology

## 2021-06-24 DIAGNOSIS — C3412 Malignant neoplasm of upper lobe, left bronchus or lung: Secondary | ICD-10-CM | POA: Diagnosis not present

## 2021-06-29 ENCOUNTER — Ambulatory Visit: Payer: Medicaid Other

## 2021-06-30 ENCOUNTER — Ambulatory Visit
Admission: RE | Admit: 2021-06-30 | Discharge: 2021-06-30 | Disposition: A | Discharge status: Home / Self Care | Payer: Medicaid Other | Source: Ambulatory Visit | Attending: Radiation Oncology | Admitting: Radiation Oncology

## 2021-06-30 DIAGNOSIS — C3412 Malignant neoplasm of upper lobe, left bronchus or lung: Secondary | ICD-10-CM | POA: Diagnosis not present

## 2021-06-30 DIAGNOSIS — Z87891 Personal history of nicotine dependence: Secondary | ICD-10-CM | POA: Diagnosis not present

## 2021-07-09 ENCOUNTER — Inpatient Hospital Stay: Payer: Medicaid Other

## 2021-07-09 ENCOUNTER — Inpatient Hospital Stay: Payer: Medicaid Other | Admitting: Oncology

## 2021-07-16 ENCOUNTER — Ambulatory Visit: Payer: Medicaid Other | Admitting: Radiation Oncology

## 2021-07-19 ENCOUNTER — Telehealth: Payer: Self-pay | Admitting: Oncology

## 2021-07-19 ENCOUNTER — Inpatient Hospital Stay: Payer: Medicaid Other

## 2021-07-19 ENCOUNTER — Inpatient Hospital Stay: Payer: Medicaid Other | Admitting: Oncology

## 2021-07-23 ENCOUNTER — Other Ambulatory Visit: Payer: Self-pay | Admitting: Licensed Clinical Social Worker

## 2021-07-23 ENCOUNTER — Other Ambulatory Visit: Payer: Self-pay

## 2021-07-23 ENCOUNTER — Ambulatory Visit
Admission: RE | Admit: 2021-07-23 | Discharge: 2021-07-23 | Disposition: A | Discharge status: Home / Self Care | Payer: Medicaid Other | Source: Ambulatory Visit | Attending: Radiation Oncology | Admitting: Radiation Oncology

## 2021-07-23 DIAGNOSIS — C3412 Malignant neoplasm of upper lobe, left bronchus or lung: Secondary | ICD-10-CM | POA: Diagnosis not present

## 2021-07-23 DIAGNOSIS — Z923 Personal history of irradiation: Secondary | ICD-10-CM | POA: Diagnosis not present

## 2021-07-23 DIAGNOSIS — C3490 Malignant neoplasm of unspecified part of unspecified bronchus or lung: Secondary | ICD-10-CM

## 2021-07-23 NOTE — Progress Notes (Signed)
Radiation Oncology Follow up Note  Name: Wesley Ferguson   Date:   07/23/2021 MRN:  [id number] DOB: March 28, 1966    This 55 y.o. 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  He did develop signs of recurrence which were PET positive we treated with SBRT and salvage and he is now seen at 1 month.  He is doing well specifically denies cough hemoptysis or chest tightness..  COMPLICATIONS OF TREATMENT: none  FOLLOW UP COMPLIANCE: keeps appointments   PHYSICAL EXAM:  There were no vitals taken for this visit. Well-developed well-nourished patient in NAD. HEENT reveals PERLA, EOMI, discs not visualized.  Oral cavity is clear. No oral mucosal lesions are identified. Neck is clear without evidence of cervical or supraclavicular adenopathy. Lungs are clear to A&P. Cardiac examination is essentially unremarkable with regular rate and rhythm without murmur rub or thrill. Abdomen is benign with no organomegaly or masses noted. Motor sensory and DTR levels are equal and symmetric in the upper and lower extremities. Cranial nerves II through XII are grossly intact. Proprioception is intact. No peripheral adenopathy or edema is identified. No motor or sensory levels are noted. Crude visual fields are within normal range.  RADIOLOGY RESULTS: No current films to review  PLAN: Present time patient is completed salvage SBRT to his left lung with minimal side effects or complaints.  On pleased with his overall progress.  I have asked to see him back in 3 to 4 months with a follow-up CT scan.  We will also have him schedule follow-up appointments and continued surveillance by Dr. Janese Banks.   Patient knows to call with any concerns.  I would like to take this opportunity to thank you for allowing me to participate in the care of your patient.Noreene Filbert, MD

## 2021-07-28 ENCOUNTER — Other Ambulatory Visit: Payer: Self-pay | Admitting: *Deleted

## 2021-07-28 MED ORDER — OXYCODONE HCL 5 MG PO TABS: 5.0000 mg | ORAL_TABLET | ORAL | 0 refills | Status: DC | PRN

## 2021-07-28 NOTE — Telephone Encounter (Signed)
I can give him 120 tab only if he comes on Monday. If I fill it today, it will only be until Monday. Please ask him what he wants to do

## 2021-07-28 NOTE — Telephone Encounter (Signed)
He is out of medicine and so she requests you refill it to last until Monday. She also reported that he just called her reporting that he is having pain and tingling in his left arm and shoulder and told him to go get checked out he told her if it gets worse that he will. I advised to encourage him to do get checked especially if it radiates into his neck and or jaw, she states he did not say it is radiating but she will call him and let him know about limited refill of pain medicine and to please get checked out

## 2021-08-02 ENCOUNTER — Encounter: Payer: Self-pay | Admitting: Oncology

## 2021-08-02 ENCOUNTER — Inpatient Hospital Stay: Payer: Medicaid Other

## 2021-08-02 ENCOUNTER — Inpatient Hospital Stay: Payer: Medicaid Other | Attending: Oncology | Admitting: Oncology

## 2021-08-02 ENCOUNTER — Other Ambulatory Visit: Payer: Self-pay

## 2021-08-02 ENCOUNTER — Other Ambulatory Visit: Payer: Self-pay | Admitting: *Deleted

## 2021-08-02 VITALS — BP 115/71 | HR 76 | Temp 94.6°F | Resp 20 | Wt 184.3 lb

## 2021-08-02 DIAGNOSIS — Z8249 Family history of ischemic heart disease and other diseases of the circulatory system: Secondary | ICD-10-CM | POA: Diagnosis not present

## 2021-08-02 DIAGNOSIS — Z808 Family history of malignant neoplasm of other organs or systems: Secondary | ICD-10-CM | POA: Diagnosis not present

## 2021-08-02 DIAGNOSIS — Z5112 Encounter for antineoplastic immunotherapy: Secondary | ICD-10-CM | POA: Insufficient documentation

## 2021-08-02 DIAGNOSIS — Z79890 Hormone replacement therapy (postmenopausal): Secondary | ICD-10-CM | POA: Diagnosis not present

## 2021-08-02 DIAGNOSIS — K5900 Constipation, unspecified: Secondary | ICD-10-CM | POA: Insufficient documentation

## 2021-08-02 DIAGNOSIS — F17210 Nicotine dependence, cigarettes, uncomplicated: Secondary | ICD-10-CM | POA: Diagnosis not present

## 2021-08-02 DIAGNOSIS — J439 Emphysema, unspecified: Secondary | ICD-10-CM | POA: Insufficient documentation

## 2021-08-02 DIAGNOSIS — D1803 Hemangioma of intra-abdominal structures: Secondary | ICD-10-CM | POA: Diagnosis not present

## 2021-08-02 DIAGNOSIS — Z7189 Other specified counseling: Secondary | ICD-10-CM | POA: Diagnosis not present

## 2021-08-02 DIAGNOSIS — R0602 Shortness of breath: Secondary | ICD-10-CM | POA: Insufficient documentation

## 2021-08-02 DIAGNOSIS — Z833 Family history of diabetes mellitus: Secondary | ICD-10-CM | POA: Diagnosis not present

## 2021-08-02 DIAGNOSIS — Z79899 Other long term (current) drug therapy: Secondary | ICD-10-CM | POA: Diagnosis not present

## 2021-08-02 DIAGNOSIS — G893 Neoplasm related pain (acute) (chronic): Secondary | ICD-10-CM | POA: Diagnosis not present

## 2021-08-02 DIAGNOSIS — I313 Pericardial effusion (noninflammatory): Secondary | ICD-10-CM | POA: Insufficient documentation

## 2021-08-02 DIAGNOSIS — Z28310 Unvaccinated for covid-19: Secondary | ICD-10-CM | POA: Diagnosis not present

## 2021-08-02 DIAGNOSIS — C3412 Malignant neoplasm of upper lobe, left bronchus or lung: Secondary | ICD-10-CM | POA: Diagnosis present

## 2021-08-02 DIAGNOSIS — R7989 Other specified abnormal findings of blood chemistry: Secondary | ICD-10-CM

## 2021-08-02 DIAGNOSIS — I700 Atherosclerosis of aorta: Secondary | ICD-10-CM | POA: Diagnosis not present

## 2021-08-02 DIAGNOSIS — R0789 Other chest pain: Secondary | ICD-10-CM | POA: Diagnosis not present

## 2021-08-02 DIAGNOSIS — C3490 Malignant neoplasm of unspecified part of unspecified bronchus or lung: Secondary | ICD-10-CM

## 2021-08-02 DIAGNOSIS — L723 Sebaceous cyst: Secondary | ICD-10-CM | POA: Diagnosis not present

## 2021-08-02 DIAGNOSIS — C778 Secondary and unspecified malignant neoplasm of lymph nodes of multiple regions: Secondary | ICD-10-CM | POA: Insufficient documentation

## 2021-08-02 DIAGNOSIS — Z801 Family history of malignant neoplasm of trachea, bronchus and lung: Secondary | ICD-10-CM | POA: Diagnosis not present

## 2021-08-02 DIAGNOSIS — R5383 Other fatigue: Secondary | ICD-10-CM | POA: Insufficient documentation

## 2021-08-02 DIAGNOSIS — E063 Autoimmune thyroiditis: Secondary | ICD-10-CM | POA: Diagnosis not present

## 2021-08-02 DIAGNOSIS — Z7289 Other problems related to lifestyle: Secondary | ICD-10-CM | POA: Diagnosis not present

## 2021-08-02 LAB — COMPREHENSIVE METABOLIC PANEL
ALT: 34 U/L (ref 0–44)
AST: 28 U/L (ref 15–41)
Albumin: 4.2 g/dL (ref 3.5–5.0)
Alkaline Phosphatase: 56 U/L (ref 38–126)
Anion gap: 8 (ref 5–15)
BUN: 10 mg/dL (ref 6–20)
CO2: 27 mmol/L (ref 22–32)
Calcium: 8.7 mg/dL — ABNORMAL LOW (ref 8.9–10.3)
Chloride: 94 mmol/L — ABNORMAL LOW (ref 98–111)
Creatinine, Ser: 0.88 mg/dL (ref 0.61–1.24)
GFR, Estimated: >60 mL/min (ref >60)
Glucose, Bld: 98 mg/dL (ref 70–99)
Potassium: 4.5 mmol/L (ref 3.5–5.1)
Sodium: 129 mmol/L — ABNORMAL LOW (ref 135–145)
Total Bilirubin: 0.5 mg/dL (ref 0.3–1.2)
Total Protein: 7.1 g/dL (ref 6.5–8.1)

## 2021-08-02 LAB — CBC WITH DIFFERENTIAL/PLATELET
Abs Immature Granulocytes: 0.04 10*3/uL (ref 0.00–0.07)
Basophils Absolute: 0.1 10*3/uL (ref 0.0–0.1)
Basophils Relative: 1 %
Eosinophils Absolute: 0.3 10*3/uL (ref 0.0–0.5)
Eosinophils Relative: 4 %
HCT: 41.3 % (ref 39.0–52.0)
Hemoglobin: 14 g/dL (ref 13.0–17.0)
Immature Granulocytes: 1 %
Lymphocytes Relative: 12 %
Lymphs Abs: 0.9 10*3/uL (ref 0.7–4.0)
MCH: 29.7 pg (ref 26.0–34.0)
MCHC: 33.9 g/dL (ref 30.0–36.0)
MCV: 87.5 fL (ref 80.0–100.0)
Monocytes Absolute: 0.7 10*3/uL (ref 0.1–1.0)
Monocytes Relative: 11 %
Neutro Abs: 5.1 10*3/uL (ref 1.7–7.7)
Neutrophils Relative %: 71 %
Platelets: 314 10*3/uL (ref 150–400)
RBC: 4.72 MIL/uL (ref 4.22–5.81)
RDW: 13.6 % (ref 11.5–15.5)
WBC: 7.1 10*3/uL (ref 4.0–10.5)
nRBC: 0 % (ref 0.0–0.2)

## 2021-08-02 LAB — T4, FREE: Free T4: 0.32 ng/dL — ABNORMAL LOW (ref 0.61–1.12)

## 2021-08-02 LAB — TSH: TSH: 127 u[IU]/mL — ABNORMAL HIGH (ref 0.350–4.500)

## 2021-08-02 MED ORDER — LEVOTHYROXINE SODIUM 50 MCG PO TABS: 50.0000 ug | ORAL_TABLET | Freq: Every day | ORAL | 1 refills | Status: DC

## 2021-08-02 MED ORDER — OXYCODONE HCL 5 MG PO TABS: 5.0000 mg | ORAL_TABLET | ORAL | 0 refills | Status: AC | PRN

## 2021-08-02 NOTE — Progress Notes (Signed)
ee

## 2021-08-02 NOTE — Progress Notes (Signed)
Hematology/Oncology Consult note [facility]  Telephone:([phone] [phone]  Patient Care Team: Patient, No Pcp Per (Inactive) as PCP - General ([place]) Telford Nab, RN as Oncology Nurse Navigator Sindy Guadeloupe, MD as Consulting Physician (Hematology and Oncology)   Name of the patient: Wesley Ferguson  [id number]  September 17, 1966   Date of visit: 08/02/21  Diagnosis- Squamous cell carcinoma of the left upper lobe of the lung stage III aT3 N1 M0  Chief complaint/ Reason for visit-discuss further management of lung cancer  Heme/Onc history: Patient is a 55 year old male who was admitted to the hospital in July 2021 with symptoms of chest pain and streaky hemoptysis.  He had a chest x-ray followed by a CT scan which showed a 5.7 x 4.4 cm left upper lobe mass along with left hilar adenopathy.  There was a low-density 3.6 lesion noted in the right hepatic lobe which ultimately turned out to be a hemangioma on MRI liver.  PET CT scan showed hypermetabolic activity in the left upper lobe and left hilar nodal tissue.  Subcarinal and right paratracheal lymph nodes with mild hypermetabolic features.   Patient underwent bronchoscopies and biopsies.Left upper lobe biopsy was consistent with non-small cell carcinoma favor squamous cell carcinoma right subcarinal lymph node biopsy was negative for malignancy.   Plan is for concurrent chemoradiation with carbotaxol followed by maintenance durvalumab.  Patient does not want to get a port placed.  He is also refused Covid vaccination   NGS testing showed high tumor mutational burden.  PD-L1 90%.  MSI stable.  Patient PIK3CA and notch2 FCHO2 fusion, T p53  Scans in May 2022 Showed 2 hypermetabolic nodules in the left upper lobe and new hypermetabolic cavitary nodule in the right upper lobe concerning for disease progression.  No evidence of distant metastatic disease.  Patient was seen by Dr. Donella Stade and  received SBRT to the left lung with plans for continued monitoring of the right upper lobe lung nodule.  Patient received last Durvalumab on 05/07/2021.  Durvalumab was on hold during radiation treatment until 06/29/2021 but patient did not follow-up after that to resume treatment  Interval history-patient stated that his cough was getting worse in the last 3 to 4 weeks and he did not feel well enough to come for his appointments.  Presently reports ongoing left chest wall pain for which he uses as needed oxycodone.  Cough is better  ECOG PS- 1 Pain scale- 3 Opioid associated constipation- no  Review of systems- Review of Systems  Constitutional:  Positive for malaise/fatigue. Negative for chills, fever and weight loss.  HENT:  Negative for congestion, ear discharge and nosebleeds.   Eyes:  Negative for blurred vision.  Respiratory:  Negative for cough, hemoptysis, sputum production, shortness of breath and wheezing.        Left chest wall pain  Cardiovascular:  Negative for chest pain, palpitations, orthopnea and claudication.  Gastrointestinal:  Negative for abdominal pain, blood in stool, constipation, diarrhea, heartburn, melena, nausea and vomiting.  Genitourinary:  Negative for dysuria, flank pain, frequency, hematuria and urgency.  Musculoskeletal:  Negative for back pain, joint pain and myalgias.  Skin:  Negative for rash.  Neurological:  Negative for dizziness, tingling, focal weakness, seizures, weakness and headaches.  Endo/Heme/Allergies:  Does not bruise/bleed easily.  Psychiatric/Behavioral:  Negative for depression and suicidal ideas. The patient does not have insomnia.       No Known Allergies   Past Medical History:  Diagnosis  Date   COPD (chronic obstructive pulmonary disease) ([place])    Emphysema of lung ([place])    Lung cancer ([place]) 06/2020   Pneumonia      Past Surgical History:  Procedure Laterality Date   BACK SURGERY  2017   lumbar region herniated disc    VIDEO BRONCHOSCOPY WITH ENDOBRONCHIAL NAVIGATION N/A 08/07/2020   Procedure: VIDEO BRONCHOSCOPY WITH ENDOBRONCHIAL NAVIGATION;  Surgeon: Tyler Pita, MD;  Location: [facility] ORS;  Service: Pulmonary;  Laterality: N/A;   VIDEO BRONCHOSCOPY WITH ENDOBRONCHIAL ULTRASOUND N/A 08/07/2020   Procedure: VIDEO BRONCHOSCOPY WITH ENDOBRONCHIAL ULTRASOUND;  Surgeon: Tyler Pita, MD;  Location: [facility] ORS;  Service: Pulmonary;  Laterality: N/A;    Social History   Socioeconomic History   Marital status: Divorced    Spouse name: Not on file   Number of children: Not on file   Years of education: Not on file   Highest education level: Not on file  Occupational History   Not on file  Tobacco Use   Smoking status: Every Day    Packs/day: 3.00    Years: 35.00    Pack years: 105.00    Types: Cigarettes   Smokeless tobacco: Former    Types: Chew   Tobacco comments:    2PPD 01/19/2021  Vaping Use   Vaping Use: Never used  Substance and Sexual Activity   Alcohol use: Yes    Alcohol/week: 8.0 standard drinks    Types: 8 Cans of beer per week   Drug use: No   Sexual activity: Yes  Other Topics Concern   Not on file  Social History Narrative   Not on file   Social Determinants of Health   Financial Resource Strain: Not on file  Food Insecurity: Not on file  Transportation Needs: Not on file  Physical Activity: Not on file  Stress: Not on file  Social Connections: Not on file  Intimate Partner Violence: Not on file    Family History  Problem Relation Age of Onset   Diabetes Father    Lung cancer Father 58   Heart attack Father    Throat cancer Maternal Aunt      Current Outpatient Medications:    acetaminophen (TYLENOL) 500 MG tablet, Take 500-1,000 mg by mouth every 6 (six) hours as needed (for pain.)., Disp: , Rfl:    albuterol (VENTOLIN HFA) 108 (90 Base) MCG/ACT inhaler, Inhale 2 puffs into the lungs every 6 (six) hours as needed for wheezing or shortness of breath., Disp:  8 g, Rfl: 2   budesonide-formoterol (SYMBICORT) 160-4.5 MCG/ACT inhaler, Inhale 2 puffs into the lungs in the morning and at bedtime., Disp: 1 each, Rfl: 12   fentaNYL (DURAGESIC) 12 MCG/HR, Place 1 patch onto the skin every 3 (three) days., Disp: 10 patch, Rfl: 0   oxyCODONE (OXY IR/ROXICODONE) 5 MG immediate release tablet, Take 1 tablet (5 mg total) by mouth every 4 (four) hours as needed for severe pain., Disp: 26 tablet, Rfl: 0   Tiotropium Bromide Monohydrate (SPIRIVA RESPIMAT) 2.5 MCG/ACT AERS, Inhale 2 puffs into the lungs daily., Disp: 4 g, Rfl: 11  Physical exam:  Vitals:   08/02/21 0851  BP: 115/71  Pulse: 76  Resp: 20  Temp: (!) 94.6 F (34.8 C)  TempSrc: Tympanic  SpO2: 98%  Weight: 184 lb 4.8 oz (83.6 kg)   Physical Exam Constitutional:      General: He is not in acute distress. Cardiovascular:     Rate and Rhythm: Normal rate  and regular rhythm.     Heart sounds: Normal heart sounds.  Pulmonary:     Effort: Pulmonary effort is normal.     Breath sounds: Normal breath sounds.  Abdominal:     General: Bowel sounds are normal.     Palpations: Abdomen is soft.  Skin:    General: Skin is warm and dry.  Neurological:     Mental Status: He is alert and oriented to person, place, and time.     CMP Latest Ref Rng & Units 05/07/2021  Glucose 70 - 99 mg/dL 118(H)  BUN 6 - 20 mg/dL 7  Creatinine 0.61 - 1.24 mg/dL 1.02  Sodium 135 - 145 mmol/L 135  Potassium 3.5 - 5.1 mmol/L 4.2  Chloride 98 - 111 mmol/L 102  CO2 22 - 32 mmol/L 24  Calcium 8.9 - 10.3 mg/dL 9.0  Total Protein 6.5 - 8.1 g/dL 7.2  Total Bilirubin 0.3 - 1.2 mg/dL 0.4  Alkaline Phos 38 - 126 U/L 66  AST 15 - 41 U/L 20  ALT 0 - 44 U/L 12   CBC Latest Ref Rng & Units 05/07/2021  WBC 4.0 - 10.5 K/uL 7.4  Hemoglobin 13.0 - 17.0 g/dL 13.8  Hematocrit 39.0 - 52.0 % 41.0  Platelets 150 - 400 K/uL 282     Assessment and plan- Patient is a 55 y.o. male with h/o stage III squamous cell carcinoma of the  left upper lobe T3 N1 M0.  He received 11 cycles of maintenance durvalumab and is here for further management of lung cancer.    CT as well as PET CT scan in May 2022 showed hypermetabolic activity in the left upper lobe and right upper lobe for which she was seen by radiation oncology and received SBRT to the left upper lobe lesion.  Last durvalumab was on 05/07/2021 and it was on hold during radiation until early July 2022 but patient did not follow-up to resume treatment following that  It has been nearly 3 months since his prior treatment and scans.  I would therefore like to repeat CT chest abdomen and pelvis with contrast at this time.  If there is further progression in his disease I would like to switch him from durvalumab to carboplatin and gemcitabine chemotherapy.  Although typically doublet chemotherapy is not recommended at progression, patient has received limited carboplatin on a weekly basis for 7 cycles until November 2021 and he may still have some platinum sensitive disease.  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  Prior to that it was low in February 2022 and therefore I held off on starting levothyroxine.  Today his TSH is 127.  I will therefore proceed with levothyroxine 50 mcg daily with repeat TSH in 6 weeks.     Visit Diagnosis 1. Malignant neoplasm of unspecified part of unspecified bronchus or lung ([place])   2. Goals of care, counseling/discussion   3. Autoimmune hypothyroidism      Dr. Randa Evens, MD, MPH [facility] at [facility] [phone] 08/02/2021 7:56 AM

## 2021-08-02 NOTE — Telephone Encounter (Signed)
I called the patient today and talk to him about his TSH level was high, his level can be because patient is on immunology drug.  Dr. Janese Banks did draw an extra test for the thyroid and its not back it.  Dr. Janese Banks would like him to start a 50 mcg levothyroxine to his pharmacy and if he can take it every month on empty stomach, wait 1 hour or 45 minutes prior to taking any meds and eating.  Dr. Janese Banks will continue monitoring the level and see what happens and will make changes along the way.  Patient agreeable to this and the pharmacy was [facility] and he will pick it up and start

## 2021-08-03 ENCOUNTER — Other Ambulatory Visit: Payer: Self-pay

## 2021-08-03 ENCOUNTER — Other Ambulatory Visit: Payer: Self-pay | Admitting: Oncology

## 2021-08-03 ENCOUNTER — Ambulatory Visit
Admission: RE | Admit: 2021-08-03 | Discharge: 2021-08-03 | Disposition: A | Discharge status: Home / Self Care | Payer: Medicaid Other | Source: Ambulatory Visit | Attending: Oncology | Admitting: Oncology

## 2021-08-03 DIAGNOSIS — D1803 Hemangioma of intra-abdominal structures: Secondary | ICD-10-CM | POA: Diagnosis not present

## 2021-08-03 DIAGNOSIS — C3490 Malignant neoplasm of unspecified part of unspecified bronchus or lung: Secondary | ICD-10-CM | POA: Diagnosis present

## 2021-08-03 DIAGNOSIS — I2510 Atherosclerotic heart disease of native coronary artery without angina pectoris: Secondary | ICD-10-CM | POA: Diagnosis not present

## 2021-08-03 DIAGNOSIS — C3412 Malignant neoplasm of upper lobe, left bronchus or lung: Secondary | ICD-10-CM | POA: Diagnosis not present

## 2021-08-03 DIAGNOSIS — J432 Centrilobular emphysema: Secondary | ICD-10-CM | POA: Diagnosis not present

## 2021-08-03 DIAGNOSIS — I700 Atherosclerosis of aorta: Secondary | ICD-10-CM | POA: Diagnosis not present

## 2021-08-03 DIAGNOSIS — J929 Pleural plaque without asbestos: Secondary | ICD-10-CM | POA: Diagnosis not present

## 2021-08-03 MED ORDER — IOHEXOL 350 MG/ML SOLN
100.0000 mL | Freq: Once | INTRAVENOUS | Status: AC | PRN
  Administered 2021-08-03: 100 mL via INTRAVENOUS

## 2021-08-03 NOTE — Progress Notes (Signed)
Or

## 2021-08-05 ENCOUNTER — Telehealth: Payer: Self-pay | Admitting: Oncology

## 2021-08-05 NOTE — Telephone Encounter (Signed)
Spoke with patient about coming earlier on 8/15 to receive treatment (add on per MD). Patient agreeable to updated time and receiving chemo after seeing Dr. Janese Banks.

## 2021-08-07 ENCOUNTER — Other Ambulatory Visit: Payer: Self-pay | Admitting: *Deleted

## 2021-08-07 DIAGNOSIS — E063 Autoimmune thyroiditis: Secondary | ICD-10-CM

## 2021-08-07 DIAGNOSIS — C3490 Malignant neoplasm of unspecified part of unspecified bronchus or lung: Secondary | ICD-10-CM

## 2021-08-09 ENCOUNTER — Inpatient Hospital Stay: Payer: Medicaid Other | Admitting: Oncology

## 2021-08-09 ENCOUNTER — Inpatient Hospital Stay: Payer: Medicaid Other

## 2021-08-09 ENCOUNTER — Other Ambulatory Visit: Payer: Self-pay

## 2021-08-09 ENCOUNTER — Inpatient Hospital Stay (HOSPITAL_BASED_OUTPATIENT_CLINIC_OR_DEPARTMENT_OTHER): Payer: Medicaid Other | Admitting: Oncology

## 2021-08-09 ENCOUNTER — Encounter: Payer: Self-pay | Admitting: Oncology

## 2021-08-09 VITALS — BP 115/74 | HR 76 | Temp 98.2°F | Resp 16 | Ht 74.0 in | Wt 185.0 lb

## 2021-08-09 DIAGNOSIS — Z5112 Encounter for antineoplastic immunotherapy: Secondary | ICD-10-CM

## 2021-08-09 DIAGNOSIS — C3490 Malignant neoplasm of unspecified part of unspecified bronchus or lung: Secondary | ICD-10-CM | POA: Diagnosis not present

## 2021-08-09 DIAGNOSIS — E063 Autoimmune thyroiditis: Secondary | ICD-10-CM

## 2021-08-09 MED ORDER — SODIUM CHLORIDE 0.9 % IV SOLN
10.0000 mg/kg | Freq: Once | INTRAVENOUS | Status: AC
  Administered 2021-08-09: 860 mg via INTRAVENOUS
  Filled 2021-08-09: qty 7.2

## 2021-08-09 MED ORDER — SODIUM CHLORIDE 0.9 % IV SOLN
Freq: Once | INTRAVENOUS | Status: AC
  Filled 2021-08-09: qty 250

## 2021-08-09 NOTE — Patient Instructions (Signed)
[place] ONCOLOGY  Discharge Instructions: Thank you for choosing [facility] to provide your oncology and hematology care.  If you have a lab appointment with the [facility], please go directly to the [facility] and check in at the registration area.  Wear comfortable clothing and clothing appropriate for easy access to any Portacath or PICC line.   We strive to give you quality time with your provider. You may need to reschedule your appointment if you arrive late (15 or more minutes).  Arriving late affects you and other patients whose appointments are after yours.  Also, if you miss three or more appointments without notifying the office, you may be dismissed from the clinic at the provider's discretion.      For prescription refill requests, have your pharmacy contact our office and allow 72 hours for refills to be completed.    Today you received the following chemotherapy and/or immunotherapy agents: Durvalumab      To help prevent nausea and vomiting after your treatment, we encourage you to take your nausea medication as directed.  BELOW ARE SYMPTOMS THAT SHOULD BE REPORTED IMMEDIATELY: *FEVER GREATER THAN 100.4 F (38 C) OR HIGHER *CHILLS OR SWEATING *NAUSEA AND VOMITING THAT IS NOT CONTROLLED WITH YOUR NAUSEA MEDICATION *UNUSUAL SHORTNESS OF BREATH *UNUSUAL BRUISING OR BLEEDING *URINARY PROBLEMS (pain or burning when urinating, or frequent urination) *BOWEL PROBLEMS (unusual diarrhea, constipation, pain near the anus) TENDERNESS IN MOUTH AND THROAT WITH OR WITHOUT PRESENCE OF ULCERS (sore throat, sores in mouth, or a toothache) UNUSUAL RASH, SWELLING OR PAIN  UNUSUAL VAGINAL DISCHARGE OR ITCHING   Items with * indicate a potential emergency and should be followed up as soon as possible or go to the Emergency Department if any problems should occur.  Please show the CHEMOTHERAPY ALERT CARD or IMMUNOTHERAPY ALERT CARD at check-in  to the Emergency Department and triage nurse.  Should you have questions after your visit or need to cancel or reschedule your appointment, please contact [facility]  [phone] and follow the prompts.  Office hours are 8:00 a.m. to 4:30 p.m. Monday - Friday. Please note that voicemails left after 4:00 p.m. may not be returned until the following business day.  We are closed weekends and major holidays. You have access to a nurse at all times for urgent questions. Please call the main number to the clinic [phone] and follow the prompts.  For any non-urgent questions, you may also contact your provider using MyChart. We now offer e-Visits for anyone 73 and older to request care online for non-urgent symptoms. For details visit [email or web address].   Also download the MyChart app! Go to the app store, search "MyChart", open the app, select [facility], and log in with your MyChart username and password.  Due to Covid, a mask is required upon entering the hospital/clinic. If you do not have a mask, one will be given to you upon arrival. For doctor visits, patients may have 1 support person aged 76 or older with them. For treatment visits, patients cannot have anyone with them due to current Covid guidelines and our immunocompromised population. Durvalumab injection What is this medication? DURVALUMAB (dur VAL ue mab) is a monoclonal antibody. It is used to treat lungcancer. This medicine may be used for other purposes; ask your health care provider orpharmacist if you have questions. COMMON BRAND NAME(S): [email or web address] What should I tell my care team before I take this medication? They need  to know if you have any of these conditions: autoimmune diseases like Crohn's disease, ulcerative colitis, or lupus have had or planning to have an allogeneic stem cell transplant (uses someone else's stem cells) history of organ transplant history of radiation to the  chest nervous system problems like myasthenia gravis or Guillain-Barre syndrome an unusual or allergic reaction to durvalumab, other medicines, foods, dyes, or preservatives pregnant or trying to get pregnant breast-feeding How should I use this medication? This medicine is for infusion into a vein. It is given by a health careprofessional in a hospital or clinic setting. A special MedGuide will be given to you before each treatment. Be sure to readthis information carefully each time. Talk to your pediatrician regarding the use of this medicine in children.Special care may be needed. Overdosage: If you think you have taken too much of this medicine contact apoison control center or emergency room at once. NOTE: This medicine is only for you. Do not share this medicine with others. What if I miss a dose? It is important not to miss your dose. Call your doctor or health careprofessional if you are unable to keep an appointment. What may interact with this medication? Interactions have not been studied. This list may not describe all possible interactions. Give your health care provider a list of all the medicines, herbs, non-prescription drugs, or dietary supplements you use. Also tell them if you smoke, drink alcohol, or use illegaldrugs. Some items may interact with your medicine. What should I watch for while using this medication? This drug may make you feel generally unwell. Continue your course of treatmenteven though you feel ill unless your doctor tells you to stop. You may need blood work done while you are taking this medicine. Do not become pregnant while taking this medicine or for 3 months after stopping it. Women should inform their doctor if they wish to become pregnant or think they might be pregnant. There is a potential for serious side effects to an unborn child. Talk to your health care professional or pharmacist for more information. Do not breast-feed an infant while taking  this medicine orfor 3 months after stopping it. What side effects may I notice from receiving this medication? Side effects that you should report to your doctor or health care professionalas soon as possible: allergic reactions like skin rash, itching or hives, swelling of the face, lips, or tongue black, tarry stools bloody or watery diarrhea breathing problems change in emotions or moods change in sex drive changes in vision chest pain or chest tightness chills confusion cough facial flushing fever headache signs and symptoms of high blood sugar such as dizziness; dry mouth; dry skin; fruity breath; nausea; stomach pain; increased hunger or thirst; increased urination signs and symptoms of liver injury like dark yellow or brown urine; general ill feeling or flu-like symptoms; light-colored stools; loss of appetite; nausea; right upper belly pain; unusually weak or tired; yellowing of the eyes or skin stomach pain trouble passing urine or change in the amount of urine weight gain or weight loss Side effects that usually do not require medical attention (report these toyour doctor or health care professional if they continue or are bothersome): bone pain constipation loss of appetite muscle pain nausea swelling of the ankles, feet, hands tiredness This list may not describe all possible side effects. Call your doctor for medical advice about side effects. You may report side effects to FDA at1-800-FDA-1088. Where should I keep my medication? This drug is given  in a hospital or clinic and will not be stored at home. NOTE: This sheet is a summary. It may not cover all possible information. If you have questions about this medicine, talk to your doctor, pharmacist, orhealth care provider.  2022 Elsevier/Gold Standard (2020-02-20 13:01:29)

## 2021-08-09 NOTE — Progress Notes (Signed)
Family wanted to check about nicotine patches. They did not see that at pharmacy

## 2021-08-09 NOTE — Progress Notes (Signed)
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  He had a chest x-ray followed by a CT scan which showed a 5.7 x 4.4 cm left upper lobe mass along with left hilar adenopathy.  There was a low-density 3.6 lesion noted in the right hepatic lobe which ultimately turned out to be a hemangioma on MRI liver.  PET CT scan showed hypermetabolic activity in the left upper lobe and left hilar nodal tissue.  Subcarinal and right paratracheal lymph nodes with mild hypermetabolic features.   Patient underwent bronchoscopies and biopsies.Left upper lobe biopsy was consistent with non-small cell carcinoma favor squamous cell carcinoma right subcarinal lymph node biopsy was negative for malignancy.   Plan is for concurrent chemoradiation with carbotaxol followed by maintenance durvalumab.  Patient does not want to get a port placed.  He is also refused Covid vaccination   NGS testing showed high tumor mutational burden.  PD-L1 90%.  MSI stable.  Patient PIK3CA and notch2 FCHO2 fusion, T p53   Scans in May 2022 Showed 2 hypermetabolic nodules in the left upper lobe and new hypermetabolic cavitary nodule in the right upper lobe concerning for disease progression.  No evidence of distant metastatic disease.  Patient was seen  by Dr. Donella Stade and received SBRT to the left lung with plans for continued monitoring of the right upper lobe lung nodule.  Patient received last Durvalumab on 05/07/2021.  Durvalumab was on hold during radiation treatment until 06/29/2021 but patient did not follow-up after that to resume treatment    Interval history-patient has occasional left chest wall pain for which she uses as needed oxycodone.  He has baseline exertional shortness of breath which is essentially stable as well as cough  ECOG PS- 1 Pain scale- 0   Review of systems- Review of Systems  Constitutional:  Negative for chills, fever, malaise/fatigue and weight loss.  HENT:  Negative for congestion, ear discharge and nosebleeds.   Eyes:  Negative for blurred vision.  Respiratory:  Positive for cough and shortness of breath. Negative for hemoptysis, sputum production and wheezing.   Cardiovascular:  Negative for chest pain, palpitations, orthopnea and claudication.  Gastrointestinal:  Negative for abdominal pain, blood in stool, constipation, diarrhea, heartburn, melena, nausea and vomiting.  Genitourinary:  Negative for dysuria, flank pain, frequency, hematuria and urgency.  Musculoskeletal:  Negative for back pain, joint pain and myalgias.  Skin:  Negative for rash.  Neurological:  Negative for dizziness, tingling, focal weakness, seizures, weakness and headaches.  Endo/Heme/Allergies:  Does not bruise/bleed easily.  Psychiatric/Behavioral:  Negative for depression and suicidal ideas. The patient does not have insomnia.      No Known Allergies   Past Medical History:  Diagnosis Date   COPD (chronic obstructive pulmonary disease) ([place])    Emphysema of lung ([place])    Lung cancer ([place])  06/2020   Pneumonia      Past Surgical History:  Procedure Laterality Date   BACK SURGERY  2017   lumbar region herniated disc   VIDEO BRONCHOSCOPY WITH ENDOBRONCHIAL NAVIGATION N/A 08/07/2020   Procedure: VIDEO BRONCHOSCOPY WITH  ENDOBRONCHIAL NAVIGATION;  Surgeon: Tyler Pita, MD;  Location: [facility] ORS;  Service: Pulmonary;  Laterality: N/A;   VIDEO BRONCHOSCOPY WITH ENDOBRONCHIAL ULTRASOUND N/A 08/07/2020   Procedure: VIDEO BRONCHOSCOPY WITH ENDOBRONCHIAL ULTRASOUND;  Surgeon: Tyler Pita, MD;  Location: [facility] ORS;  Service: Pulmonary;  Laterality: N/A;    Social History   Socioeconomic History   Marital status: Divorced    Spouse name: Not on file   Number of children: Not on file   Years of education: Not on file   Highest education level: Not on file  Occupational History   Not on file  Tobacco Use   Smoking status: Every Day    Packs/day: 2.00    Years: 35.00    Pack years: 70.00    Types: Cigarettes   Smokeless tobacco: Former    Types: Chew   Tobacco comments:    2PPD 01/19/2021  Vaping Use   Vaping Use: Never used  Substance and Sexual Activity   Alcohol use: Yes    Alcohol/week: 4.0 standard drinks    Types: 4 Cans of beer per week   Drug use: No   Sexual activity: Yes  Other Topics Concern   Not on file  Social History Narrative   Not on file   Social Determinants of Health   Financial Resource Strain: Not on file  Food Insecurity: Not on file  Transportation Needs: Not on file  Physical Activity: Not on file  Stress: Not on file  Social Connections: Not on file  Intimate Partner Violence: Not on file    Family History  Problem Relation Age of Onset   Diabetes Father    Lung cancer Father 63   Heart attack Father    Throat cancer Maternal Aunt      Current Outpatient Medications:    acetaminophen (TYLENOL) 500 MG tablet, Take 500-1,000 mg by mouth every 6 (six) hours as needed (for pain.)., Disp: , Rfl:    albuterol (VENTOLIN HFA) 108 (90 Base) MCG/ACT inhaler, Inhale 2 puffs into the lungs every 6 (six) hours as needed for wheezing or shortness of breath., Disp: 8 g, Rfl: 2   budesonide-formoterol (SYMBICORT) 160-4.5 MCG/ACT inhaler, Inhale 2 puffs into the  lungs in the morning and at bedtime., Disp: 1 each, Rfl: 12   levothyroxine (SYNTHROID) 50 MCG tablet, Take 1 tablet (50 mcg total) by mouth daily before breakfast., Disp: 30 tablet, Rfl: 1   oxyCODONE (OXY IR/ROXICODONE) 5 MG immediate release tablet, Take 1 tablet (5 mg total) by mouth every 4 (four) hours as needed for up to 14 days for severe pain., Disp: 84 tablet, Rfl: 0   fentaNYL (DURAGESIC) 12 MCG/HR, Place 1 patch onto the skin every 3 (three) days. (Patient not taking: No sig reported), Disp: 10 patch, Rfl: 0  Physical exam:  Vitals:   08/09/21 1306  BP: 115/74  Pulse: 76  Resp: 16  Temp: 98.2 F (36.8 C)  TempSrc: Oral  Weight: 185 lb (83.9 kg)  Height: _0  (1.88 m)   Physical Exam Cardiovascular:     Rate and Rhythm: Normal rate and regular rhythm.     Heart sounds: Normal heart sounds.  Pulmonary:     Effort: Pulmonary effort is normal.  Breath sounds: Normal breath sounds.  Skin:    General: Skin is warm and dry.  Neurological:     Mental Status: He is alert and oriented to person, place, and time.     CMP Latest Ref Rng & Units 08/02/2021  Glucose 70 - 99 mg/dL 98  BUN 6 - 20 mg/dL 10  Creatinine 0.61 - 1.24 mg/dL 0.88  Sodium 135 - 145 mmol/L 129(L)  Potassium 3.5 - 5.1 mmol/L 4.5  Chloride 98 - 111 mmol/L 94(L)  CO2 22 - 32 mmol/L 27  Calcium 8.9 - 10.3 mg/dL 8.7(L)  Total Protein 6.5 - 8.1 g/dL 7.1  Total Bilirubin 0.3 - 1.2 mg/dL 0.5  Alkaline Phos 38 - 126 U/L 56  AST 15 - 41 U/L 28  ALT 0 - 44 U/L 34   CBC Latest Ref Rng & Units 08/02/2021  WBC 4.0 - 10.5 K/uL 7.1  Hemoglobin 13.0 - 17.0 g/dL 14.0  Hematocrit 39.0 - 52.0 % 41.3  Platelets 150 - 400 K/uL 314    No images are attached to the encounter.  CT CHEST ABDOMEN PELVIS W CONTRAST  Result Date: 08/03/2021 CLINICAL DATA:  Restaging left upper lobe lung cancer diagnosed in July 2021. Chemotherapy. Establishment of baseline prior to initiation of radiation therapy. EXAM: CT CHEST,  ABDOMEN, AND PELVIS WITH CONTRAST TECHNIQUE: Multidetector CT imaging of the chest, abdomen and pelvis was performed following the standard protocol during bolus administration of intravenous contrast. CONTRAST:  159m OMNIPAQUE IOHEXOL 350 MG/ML SOLN COMPARISON:  Multiple exams, including 05/13/2021 and 04/21/2021 FINDINGS: CT CHEST FINDINGS Cardiovascular: Aortic and left anterior descending coronary artery atherosclerosis. Small pericardial effusion. Mediastinum/Nodes: No pathologic adenopathy. Lungs/Pleura: Centrilobular emphysema. Airway thickening is present, suggesting bronchitis or reactive airways disease. Cavitary right upper lobe lesion 1.5 by 1.1 cm on image 72 series 3, previously 1.6 by 1.5 cm, with substantially reduced wall thickness compared to previous. 6 by 5 mm left lower lobe nodule on image 139 series 3, stable. Substantial airway plugging in the left upper lobe as before. Irregular lobulated left upper lobe lesion is again identified, measuring in total about 5.1 by 2.7 by 3.1 cm (volume = 22 cm^3), previously 4.8 by 2.7 by 3.2 cm (volume = 22 cm^3). This has a lower density more lateral portion extending to the pleural surface, with trace adjacent pleural fluid which may be loculated. Surrounding interstitial accentuation is present, I cannot exclude mild lymphangitic carcinomatosis along the margins of this lesion. The lesion abuts the major fissure. The previous small nodule in the superior segment right lower lobe shown on 04/21/2021 is no longer readily apparent. Musculoskeletal: Unremarkable CT ABDOMEN PELVIS FINDINGS Hepatobiliary: Stable hepatic hemangiomas. No new hepatic lesions are observed. Gallbladder unremarkable. Pancreas: Unremarkable Spleen: Unremarkable Adrenals/Urinary Tract: Unremarkable Stomach/Bowel: Unremarkable Vascular/Lymphatic: Mild aortoiliac atherosclerotic vascular disease. Reproductive: Upper normal prostate size. Other: Sebaceous cyst or similar benign lesion  along the anterior upper abdomen subcutaneous tissues on image 55 series 2. Musculoskeletal: Unremarkable IMPRESSION: 1. Substantial reduction in thickness and mild reduction in size of the previous cavitary right upper lobe nodule. The small nodule in the superior segment right lower lobe is no longer readily apparent. 2. Overall similar size of the left upper lobe mass compared to previous exam of 04/21/2021. 3. No findings of metastatic disease to the abdomen/pelvis. 4. Other imaging findings of potential clinical significance: Aortic Atherosclerosis (ICD10-I70.0). Coronary atherosclerosis. Small pericardial effusion. Emphysema (ICD10-J43.9). Airway thickening is present, suggesting bronchitis or reactive airways disease. Stable hepatic hemangiomas. Electronically  Signed   By: Van Clines M.D.   On: 08/03/2021 10:58     Assessment and plan- Patient is a 55 y.o. male with h/o stage III squamous cell carcinoma of the left upper lobe T3 N1 M0.  He is here for on treatment assessment prior to cycle 12 of maintenance durvalumab and to discuss CT scan results and further management  I have reviewed CT chest abdomen pelvis images independently and we also discussed his case at tumor board.Patient had 3 areas of concern.  Left upper lobe lung nodule which was radiated is overall stable.  The right upper lobe lung nodule was not treated but is presently decreased in size.  Right lower lobe lung nodule is not readily apparent at this time.  My plan is therefore to continue maintenance durvalumab for a year which was started in December 2021.  He missed 2 doses and I will plan to complete his treatment sometime in January 2023  Patient wishes to switch to monthly dose of durvalumab.  He will receive 10 mg/kg dose today and I will see him back in 2 weeks for cycle 13 but he will start monthly dosing from cycle 13.  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  We will repeat TSH in about 4  weeks   Visit Diagnosis 1. Encounter for antineoplastic immunotherapy   2. Malignant neoplasm of unspecified part of unspecified bronchus or lung ([place])   3. Autoimmune hypothyroidism      Dr. Randa Evens, MD, MPH [facility] at [facility] [phone] 08/09/2021 5:04 PM

## 2021-08-12 ENCOUNTER — Telehealth: Payer: Self-pay | Admitting: *Deleted

## 2021-08-12 NOTE — Telephone Encounter (Signed)
Patient would like to know if it is OK to take Benadryl.

## 2021-08-12 NOTE — Telephone Encounter (Signed)
Patient had some itching and he had used some over-the-counter lotion and it did not help and wanted to know if he could just take a Benadryl if needed at times.  I told him that it is okay to do that

## 2021-08-17 ENCOUNTER — Ambulatory Visit: Payer: Medicaid Other

## 2021-08-19 ENCOUNTER — Encounter: Payer: Self-pay | Admitting: Oncology

## 2021-08-23 ENCOUNTER — Encounter: Payer: Self-pay | Admitting: Oncology

## 2021-08-23 ENCOUNTER — Inpatient Hospital Stay: Payer: Medicaid Other

## 2021-08-23 ENCOUNTER — Inpatient Hospital Stay (HOSPITAL_BASED_OUTPATIENT_CLINIC_OR_DEPARTMENT_OTHER): Payer: Medicaid Other | Admitting: Oncology

## 2021-08-23 VITALS — BP 97/67 | HR 79 | Temp 98.5°F | Resp 16 | Ht 74.0 in | Wt 183.7 lb

## 2021-08-23 DIAGNOSIS — C3490 Malignant neoplasm of unspecified part of unspecified bronchus or lung: Secondary | ICD-10-CM

## 2021-08-23 DIAGNOSIS — E063 Autoimmune thyroiditis: Secondary | ICD-10-CM

## 2021-08-23 DIAGNOSIS — Z5112 Encounter for antineoplastic immunotherapy: Secondary | ICD-10-CM

## 2021-08-23 LAB — CBC WITH DIFFERENTIAL/PLATELET
Abs Immature Granulocytes: 0.05 10*3/uL (ref 0.00–0.07)
Basophils Absolute: 0.1 10*3/uL (ref 0.0–0.1)
Basophils Relative: 1 %
Eosinophils Absolute: 0.4 10*3/uL (ref 0.0–0.5)
Eosinophils Relative: 6 %
HCT: 38.8 % — ABNORMAL LOW (ref 39.0–52.0)
Hemoglobin: 13.3 g/dL (ref 13.0–17.0)
Immature Granulocytes: 1 %
Lymphocytes Relative: 12 %
Lymphs Abs: 0.8 10*3/uL (ref 0.7–4.0)
MCH: 30.1 pg (ref 26.0–34.0)
MCHC: 34.3 g/dL (ref 30.0–36.0)
MCV: 87.8 fL (ref 80.0–100.0)
Monocytes Absolute: 0.8 10*3/uL (ref 0.1–1.0)
Monocytes Relative: 11 %
Neutro Abs: 4.9 10*3/uL (ref 1.7–7.7)
Neutrophils Relative %: 69 %
Platelets: 292 10*3/uL (ref 150–400)
RBC: 4.42 MIL/uL (ref 4.22–5.81)
RDW: 14.6 % (ref 11.5–15.5)
WBC: 7.1 10*3/uL (ref 4.0–10.5)
nRBC: 0 % (ref 0.0–0.2)

## 2021-08-23 LAB — COMPREHENSIVE METABOLIC PANEL
ALT: 18 U/L (ref 0–44)
AST: 24 U/L (ref 15–41)
Albumin: 4.3 g/dL (ref 3.5–5.0)
Alkaline Phosphatase: 61 U/L (ref 38–126)
Anion gap: 9 (ref 5–15)
BUN: 10 mg/dL (ref 6–20)
CO2: 27 mmol/L (ref 22–32)
Calcium: 9.1 mg/dL (ref 8.9–10.3)
Chloride: 95 mmol/L — ABNORMAL LOW (ref 98–111)
Creatinine, Ser: 0.91 mg/dL (ref 0.61–1.24)
GFR, Estimated: >60 mL/min (ref >60)
Glucose, Bld: 116 mg/dL — ABNORMAL HIGH (ref 70–99)
Potassium: 4.1 mmol/L (ref 3.5–5.1)
Sodium: 131 mmol/L — ABNORMAL LOW (ref 135–145)
Total Bilirubin: 0.6 mg/dL (ref 0.3–1.2)
Total Protein: 7.4 g/dL (ref 6.5–8.1)

## 2021-08-23 MED ORDER — SODIUM CHLORIDE 0.9 % IV SOLN
1500.0000 mg | Freq: Once | INTRAVENOUS | Status: AC
  Administered 2021-08-23: 1500 mg via INTRAVENOUS
  Filled 2021-08-23: qty 30

## 2021-08-23 MED ORDER — SODIUM CHLORIDE 0.9 % IV SOLN
Freq: Once | INTRAVENOUS | Status: AC
  Filled 2021-08-23: qty 250

## 2021-08-23 NOTE — Progress Notes (Signed)
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  He had a chest x-ray followed by a CT scan which showed a 5.7 x 4.4 cm left upper lobe mass along with left hilar adenopathy.  There was a low-density 3.6 lesion noted in the right hepatic lobe which ultimately turned out to be a hemangioma on MRI liver.  PET CT scan showed hypermetabolic activity in the left upper lobe and left hilar nodal tissue.  Subcarinal and right paratracheal lymph nodes with mild hypermetabolic features.   Patient underwent bronchoscopies and biopsies.Left upper lobe biopsy was consistent with non-small cell carcinoma favor squamous cell carcinoma right subcarinal lymph node biopsy was negative for malignancy.   Plan is for concurrent chemoradiation with carbotaxol followed by maintenance durvalumab.  Patient does not want to get a port placed.  He is also refused Covid vaccination   NGS testing showed high tumor mutational burden.  PD-L1 90%.  MSI stable.  Patient PIK3CA and notch2 FCHO2 fusion, T p53   Scans in May 2022 Showed 2 hypermetabolic nodules in the left upper lobe and new hypermetabolic cavitary nodule in the right upper lobe concerning for disease progression.  No evidence of distant metastatic disease.  Patient was seen  by Dr. Donella Stade and received SBRT to the left lung with plans for continued monitoring of the right upper lobe lung nodule.  Patient received last Durvalumab on 05/07/2021.  Durvalumab was on hold during radiation treatment until 06/29/2021 but patient did not follow-up after that to resume treatment    Interval history-patient reports that he has on and off constipation since starting levothyroxine.  He did have a bowel movement today.  He has baseline fatigue and exertional shortness of breath which is unchanged.  ECOG PS- 1 Pain scale- 0   Review of systems- Review of Systems  Constitutional:  Positive for malaise/fatigue. Negative for chills, fever and weight loss.  HENT:  Negative for congestion, ear discharge and nosebleeds.   Eyes:  Negative for blurred vision.  Respiratory:  Positive for shortness of breath. Negative for cough, hemoptysis, sputum production and wheezing.   Cardiovascular:  Negative for chest pain, palpitations, orthopnea and claudication.  Gastrointestinal:  Negative for abdominal pain, blood in stool, constipation, diarrhea, heartburn, melena, nausea and vomiting.  Genitourinary:  Negative for dysuria, flank pain, frequency, hematuria and urgency.  Musculoskeletal:  Negative for back pain, joint pain and myalgias.  Skin:  Negative for rash.  Neurological:  Negative for dizziness, tingling, focal weakness, seizures, weakness and headaches.  Endo/Heme/Allergies:  Does not bruise/bleed easily.  Psychiatric/Behavioral:  Negative for depression and suicidal ideas. The patient does not have insomnia.      No Known Allergies   Past Medical History:  Diagnosis Date   COPD (chronic obstructive pulmonary disease) ([place])    Emphysema of lung ([place])  Lung cancer ([place]) 06/2020   Pneumonia      Past Surgical History:  Procedure Laterality Date   BACK SURGERY  2017   lumbar region herniated disc   VIDEO BRONCHOSCOPY WITH ENDOBRONCHIAL NAVIGATION N/A 08/07/2020    Procedure: VIDEO BRONCHOSCOPY WITH ENDOBRONCHIAL NAVIGATION;  Surgeon: Tyler Pita, MD;  Location: [facility] ORS;  Service: Pulmonary;  Laterality: N/A;   VIDEO BRONCHOSCOPY WITH ENDOBRONCHIAL ULTRASOUND N/A 08/07/2020   Procedure: VIDEO BRONCHOSCOPY WITH ENDOBRONCHIAL ULTRASOUND;  Surgeon: Tyler Pita, MD;  Location: [facility] ORS;  Service: Pulmonary;  Laterality: N/A;    Social History   Socioeconomic History   Marital status: Divorced    Spouse name: Not on file   Number of children: Not on file   Years of education: Not on file   Highest education level: Not on file  Occupational History   Not on file  Tobacco Use   Smoking status: Every Day    Packs/day: 2.00    Years: 35.00    Pack years: 70.00    Types: Cigarettes   Smokeless tobacco: Former    Types: Chew   Tobacco comments:    2PPD 01/19/2021  Vaping Use   Vaping Use: Never used  Substance and Sexual Activity   Alcohol use: Yes    Alcohol/week: 4.0 standard drinks    Types: 4 Cans of beer per week   Drug use: No   Sexual activity: Yes  Other Topics Concern   Not on file  Social History Narrative   Not on file   Social Determinants of Health   Financial Resource Strain: Not on file  Food Insecurity: Not on file  Transportation Needs: Not on file  Physical Activity: Not on file  Stress: Not on file  Social Connections: Not on file  Intimate Partner Violence: Not on file    Family History  Problem Relation Age of Onset   Diabetes Father    Lung cancer Father 43   Heart attack Father    Throat cancer Maternal Aunt      Current Outpatient Medications:    acetaminophen (TYLENOL) 500 MG tablet, Take 500-1,000 mg by mouth every 6 (six) hours as needed (for pain.)., Disp: , Rfl:    albuterol (VENTOLIN HFA) 108 (90 Base) MCG/ACT inhaler, Inhale 2 puffs into the lungs every 6 (six) hours as needed for wheezing or shortness of breath., Disp: 8 g, Rfl: 2   budesonide-formoterol (SYMBICORT) 160-4.5 MCG/ACT  inhaler, Inhale 2 puffs into the lungs in the morning and at bedtime., Disp: 1 each, Rfl: 12   levothyroxine (SYNTHROID) 50 MCG tablet, Take 1 tablet (50 mcg total) by mouth daily before breakfast., Disp: 30 tablet, Rfl: 1   fentaNYL (DURAGESIC) 12 MCG/HR, Place 1 patch onto the skin every 3 (three) days., Disp: 10 patch, Rfl: 0  Physical exam:  Vitals:   08/23/21 1105 08/23/21 1115  BP: 97/67 97/67  Pulse: 79   Resp: 16   Temp: 98.5 F (36.9 C)   TempSrc: Oral   Weight: 183 lb 11.2 oz (83.3 kg)   Height: 6' 2"  (1.88 m)    Physical Exam Constitutional:      General: He is not in acute distress. Cardiovascular:     Rate and Rhythm: Normal rate and regular rhythm.     Heart sounds: Normal heart sounds.  Pulmonary:     Effort: Pulmonary effort is normal.     Breath sounds: Normal breath sounds.  Abdominal:     General:  Bowel sounds are normal.     Palpations: Abdomen is soft.  Skin:    General: Skin is warm and dry.  Neurological:     Mental Status: He is alert and oriented to person, place, and time.     CMP Latest Ref Rng & Units 08/23/2021  Glucose 70 - 99 mg/dL 116(H)  BUN 6 - 20 mg/dL 10  Creatinine 0.61 - 1.24 mg/dL 0.91  Sodium 135 - 145 mmol/L 131(L)  Potassium 3.5 - 5.1 mmol/L 4.1  Chloride 98 - 111 mmol/L 95(L)  CO2 22 - 32 mmol/L 27  Calcium 8.9 - 10.3 mg/dL 9.1  Total Protein 6.5 - 8.1 g/dL 7.4  Total Bilirubin 0.3 - 1.2 mg/dL 0.6  Alkaline Phos 38 - 126 U/L 61  AST 15 - 41 U/L 24  ALT 0 - 44 U/L 18   CBC Latest Ref Rng & Units 08/23/2021  WBC 4.0 - 10.5 K/uL 7.1  Hemoglobin 13.0 - 17.0 g/dL 13.3  Hematocrit 39.0 - 52.0 % 38.8(L)  Platelets 150 - 400 K/uL 292    No images are attached to the encounter.  CT CHEST ABDOMEN PELVIS W CONTRAST  Result Date: 08/03/2021 CLINICAL DATA:  Restaging left upper lobe lung cancer diagnosed in July 2021. Chemotherapy. Establishment of baseline prior to initiation of radiation therapy. EXAM: CT CHEST, ABDOMEN, AND  PELVIS WITH CONTRAST TECHNIQUE: Multidetector CT imaging of the chest, abdomen and pelvis was performed following the standard protocol during bolus administration of intravenous contrast. CONTRAST:  112m OMNIPAQUE IOHEXOL 350 MG/ML SOLN COMPARISON:  Multiple exams, including 05/13/2021 and 04/21/2021 FINDINGS: CT CHEST FINDINGS Cardiovascular: Aortic and left anterior descending coronary artery atherosclerosis. Small pericardial effusion. Mediastinum/Nodes: No pathologic adenopathy. Lungs/Pleura: Centrilobular emphysema. Airway thickening is present, suggesting bronchitis or reactive airways disease. Cavitary right upper lobe lesion 1.5 by 1.1 cm on image 72 series 3, previously 1.6 by 1.5 cm, with substantially reduced wall thickness compared to previous. 6 by 5 mm left lower lobe nodule on image 139 series 3, stable. Substantial airway plugging in the left upper lobe as before. Irregular lobulated left upper lobe lesion is again identified, measuring in total about 5.1 by 2.7 by 3.1 cm (volume = 22 cm^3), previously 4.8 by 2.7 by 3.2 cm (volume = 22 cm^3). This has a lower density more lateral portion extending to the pleural surface, with trace adjacent pleural fluid which may be loculated. Surrounding interstitial accentuation is present, I cannot exclude mild lymphangitic carcinomatosis along the margins of this lesion. The lesion abuts the major fissure. The previous small nodule in the superior segment right lower lobe shown on 04/21/2021 is no longer readily apparent. Musculoskeletal: Unremarkable CT ABDOMEN PELVIS FINDINGS Hepatobiliary: Stable hepatic hemangiomas. No new hepatic lesions are observed. Gallbladder unremarkable. Pancreas: Unremarkable Spleen: Unremarkable Adrenals/Urinary Tract: Unremarkable Stomach/Bowel: Unremarkable Vascular/Lymphatic: Mild aortoiliac atherosclerotic vascular disease. Reproductive: Upper normal prostate size. Other: Sebaceous cyst or similar benign lesion along the  anterior upper abdomen subcutaneous tissues on image 55 series 2. Musculoskeletal: Unremarkable IMPRESSION: 1. Substantial reduction in thickness and mild reduction in size of the previous cavitary right upper lobe nodule. The small nodule in the superior segment right lower lobe is no longer readily apparent. 2. Overall similar size of the left upper lobe mass compared to previous exam of 04/21/2021. 3. No findings of metastatic disease to the abdomen/pelvis. 4. Other imaging findings of potential clinical significance: Aortic Atherosclerosis (ICD10-I70.0). Coronary atherosclerosis. Small pericardial effusion. Emphysema (ICD10-J43.9). Airway thickening is present, suggesting bronchitis or  reactive airways disease. Stable hepatic hemangiomas. Electronically Signed   By: Van Clines M.D.   On: 08/03/2021 10:58     Assessment and plan- Patient is a 55 y.o. male with h/o stage III squamous cell carcinoma of the left upper lobe T3 N1 M0.  He is here for cycle 13 of maintenance durvalumab  Counts okay to proceed with cycle 13 of maintenance durvalumab which she will now be getting on a monthly basis.  I will see him back in 4 weeks for his next cycle.  Plan to continue and finish up 1 year of adjuvant treatment in January 2023.  Autoimmune hypothyroidism: He is currently on levothyroxine 50 mcg.  Repeat TSH in 4 weeks and decide about adjusting the dose further based on that.  Constipation: Possibly secondary to uncontrolled hypothyroidism.  Continue bowel medications including MiraLAX and senna.  Hopefully should improve after better control of thyroid functions   Visit Diagnosis 1. Malignant neoplasm of unspecified part of unspecified bronchus or lung ([place])   2. Autoimmune hypothyroidism   3. Encounter for antineoplastic immunotherapy      Dr. Randa Evens, MD, MPH [facility] at [facility] [phone] 08/23/2021 4:30 PM

## 2021-08-23 NOTE — Patient Instructions (Signed)
[facility] ONCOLOGY  Discharge Instructions: Thank you for choosing [facility] to provide your oncology and hematology care.  If you have a lab appointment with the [facility], please go directly to the [facility] and check in at the registration area.  Wear comfortable clothing and clothing appropriate for easy access to any Portacath or PICC line.   We strive to give you quality time with your provider. You may need to reschedule your appointment if you arrive late (15 or more minutes).  Arriving late affects you and other patients whose appointments are after yours.  Also, if you miss three or more appointments without notifying the office, you may be dismissed from the clinic at the provider's discretion.      For prescription refill requests, have your pharmacy contact our office and allow 72 hours for refills to be completed.    Today you received the following chemotherapy and/or immunotherapy agents Imfinzi      To help prevent nausea and vomiting after your treatment, we encourage you to take your nausea medication as directed.  BELOW ARE SYMPTOMS THAT SHOULD BE REPORTED IMMEDIATELY: *FEVER GREATER THAN 100.4 F (38 C) OR HIGHER *CHILLS OR SWEATING *NAUSEA AND VOMITING THAT IS NOT CONTROLLED WITH YOUR NAUSEA MEDICATION *UNUSUAL SHORTNESS OF BREATH *UNUSUAL BRUISING OR BLEEDING *URINARY PROBLEMS (pain or burning when urinating, or frequent urination) *BOWEL PROBLEMS (unusual diarrhea, constipation, pain near the anus) TENDERNESS IN MOUTH AND THROAT WITH OR WITHOUT PRESENCE OF ULCERS (sore throat, sores in mouth, or a toothache) UNUSUAL RASH, SWELLING OR PAIN  UNUSUAL VAGINAL DISCHARGE OR ITCHING   Items with * indicate a potential emergency and should be followed up as soon as possible or go to the Emergency Department if any problems should occur.  Please show the CHEMOTHERAPY ALERT CARD or IMMUNOTHERAPY ALERT CARD at check-in to  the Emergency Department and triage nurse.  Should you have questions after your visit or need to cancel or reschedule your appointment, please contact [facility]  [phone] and follow the prompts.  Office hours are 8:00 a.m. to 4:30 p.m. Monday - Friday. Please note that voicemails left after 4:00 p.m. may not be returned until the following business day.  We are closed weekends and major holidays. You have access to a nurse at all times for urgent questions. Please call the main number to the clinic [phone] and follow the prompts.  For any non-urgent questions, you may also contact your provider using MyChart. We now offer e-Visits for anyone 55 and older to request care online for non-urgent symptoms. For details visit [email or web address].   Also download the MyChart app! Go to the app store, search "MyChart", open the app, select [facility], and log in with your MyChart username and password.  Due to Covid, a mask is required upon entering the hospital/clinic. If you do not have a mask, one will be given to you upon arrival. For doctor visits, patients may have 1 support person aged 33 or older with them. For treatment visits, patients cannot have anyone with them due to current Covid guidelines and our immunocompromised population.

## 2021-08-23 NOTE — Progress Notes (Signed)
Pt says that since he started on the levothyroxine that his bowels are not working as well, today had one. He sometimes gets dizzy when he gets out of bed at times. Today right before coming in the car he felt dizzy. His vitals were low but when he stood up it was higher.

## 2021-08-24 ENCOUNTER — Ambulatory Visit: Payer: Medicaid Other | Admitting: Oncology

## 2021-09-06 ENCOUNTER — Other Ambulatory Visit: Payer: Self-pay | Admitting: *Deleted

## 2021-09-06 MED ORDER — OXYCODONE HCL 5 MG PO TABS: 5.0000 mg | ORAL_TABLET | ORAL | 0 refills | Status: DC | PRN

## 2021-09-20 ENCOUNTER — Ambulatory Visit: Payer: Medicaid Other | Admitting: Oncology

## 2021-09-20 ENCOUNTER — Inpatient Hospital Stay: Payer: Medicaid Other

## 2021-09-20 ENCOUNTER — Other Ambulatory Visit: Payer: Medicaid Other

## 2021-09-20 ENCOUNTER — Other Ambulatory Visit: Payer: Self-pay | Admitting: *Deleted

## 2021-09-20 ENCOUNTER — Inpatient Hospital Stay: Payer: Medicaid Other | Attending: Oncology

## 2021-09-20 ENCOUNTER — Encounter: Payer: Self-pay | Admitting: Oncology

## 2021-09-20 ENCOUNTER — Inpatient Hospital Stay (HOSPITAL_BASED_OUTPATIENT_CLINIC_OR_DEPARTMENT_OTHER): Payer: Medicaid Other | Admitting: Oncology

## 2021-09-20 ENCOUNTER — Ambulatory Visit: Payer: Medicaid Other

## 2021-09-20 VITALS — BP 106/70 | HR 72 | Temp 98.1°F | Resp 16 | Ht 74.0 in | Wt 185.0 lb

## 2021-09-20 DIAGNOSIS — G893 Neoplasm related pain (acute) (chronic): Secondary | ICD-10-CM

## 2021-09-20 DIAGNOSIS — F17210 Nicotine dependence, cigarettes, uncomplicated: Secondary | ICD-10-CM | POA: Insufficient documentation

## 2021-09-20 DIAGNOSIS — C3490 Malignant neoplasm of unspecified part of unspecified bronchus or lung: Secondary | ICD-10-CM

## 2021-09-20 DIAGNOSIS — Z28310 Unvaccinated for covid-19: Secondary | ICD-10-CM | POA: Diagnosis not present

## 2021-09-20 DIAGNOSIS — D1803 Hemangioma of intra-abdominal structures: Secondary | ICD-10-CM | POA: Insufficient documentation

## 2021-09-20 DIAGNOSIS — Z8249 Family history of ischemic heart disease and other diseases of the circulatory system: Secondary | ICD-10-CM | POA: Insufficient documentation

## 2021-09-20 DIAGNOSIS — Z79899 Other long term (current) drug therapy: Secondary | ICD-10-CM | POA: Insufficient documentation

## 2021-09-20 DIAGNOSIS — R5383 Other fatigue: Secondary | ICD-10-CM | POA: Insufficient documentation

## 2021-09-20 DIAGNOSIS — R0789 Other chest pain: Secondary | ICD-10-CM | POA: Insufficient documentation

## 2021-09-20 DIAGNOSIS — Z5112 Encounter for antineoplastic immunotherapy: Secondary | ICD-10-CM | POA: Insufficient documentation

## 2021-09-20 DIAGNOSIS — R519 Headache, unspecified: Secondary | ICD-10-CM | POA: Diagnosis not present

## 2021-09-20 DIAGNOSIS — R590 Localized enlarged lymph nodes: Secondary | ICD-10-CM | POA: Insufficient documentation

## 2021-09-20 DIAGNOSIS — E063 Autoimmune thyroiditis: Secondary | ICD-10-CM

## 2021-09-20 DIAGNOSIS — Z808 Family history of malignant neoplasm of other organs or systems: Secondary | ICD-10-CM | POA: Insufficient documentation

## 2021-09-20 DIAGNOSIS — Z833 Family history of diabetes mellitus: Secondary | ICD-10-CM | POA: Insufficient documentation

## 2021-09-20 DIAGNOSIS — Z801 Family history of malignant neoplasm of trachea, bronchus and lung: Secondary | ICD-10-CM | POA: Diagnosis not present

## 2021-09-20 DIAGNOSIS — C3412 Malignant neoplasm of upper lobe, left bronchus or lung: Secondary | ICD-10-CM | POA: Diagnosis present

## 2021-09-20 DIAGNOSIS — Z7951 Long term (current) use of inhaled steroids: Secondary | ICD-10-CM | POA: Diagnosis not present

## 2021-09-20 LAB — CBC WITH DIFFERENTIAL/PLATELET
Abs Immature Granulocytes: 0.05 10*3/uL (ref 0.00–0.07)
Basophils Absolute: 0.1 10*3/uL (ref 0.0–0.1)
Basophils Relative: 2 %
Eosinophils Absolute: 0.4 10*3/uL (ref 0.0–0.5)
Eosinophils Relative: 5 %
HCT: 38.7 % — ABNORMAL LOW (ref 39.0–52.0)
Hemoglobin: 13.2 g/dL (ref 13.0–17.0)
Immature Granulocytes: 1 %
Lymphocytes Relative: 12 %
Lymphs Abs: 0.9 10*3/uL (ref 0.7–4.0)
MCH: 30.6 pg (ref 26.0–34.0)
MCHC: 34.1 g/dL (ref 30.0–36.0)
MCV: 89.8 fL (ref 80.0–100.0)
Monocytes Absolute: 0.8 10*3/uL (ref 0.1–1.0)
Monocytes Relative: 11 %
Neutro Abs: 5.3 10*3/uL (ref 1.7–7.7)
Neutrophils Relative %: 69 %
Platelets: 284 10*3/uL (ref 150–400)
RBC: 4.31 MIL/uL (ref 4.22–5.81)
RDW: 14.8 % (ref 11.5–15.5)
WBC: 7.6 10*3/uL (ref 4.0–10.5)
nRBC: 0 % (ref 0.0–0.2)

## 2021-09-20 LAB — COMPREHENSIVE METABOLIC PANEL
ALT: 22 U/L (ref 0–44)
AST: 28 U/L (ref 15–41)
Albumin: 4.5 g/dL (ref 3.5–5.0)
Alkaline Phosphatase: 57 U/L (ref 38–126)
Anion gap: 6 (ref 5–15)
BUN: 7 mg/dL (ref 6–20)
CO2: 26 mmol/L (ref 22–32)
Calcium: 9 mg/dL (ref 8.9–10.3)
Chloride: 98 mmol/L (ref 98–111)
Creatinine, Ser: 0.78 mg/dL (ref 0.61–1.24)
GFR, Estimated: >60 mL/min (ref >60)
Glucose, Bld: 93 mg/dL (ref 70–99)
Potassium: 4.6 mmol/L (ref 3.5–5.1)
Sodium: 130 mmol/L — ABNORMAL LOW (ref 135–145)
Total Bilirubin: 0.9 mg/dL (ref 0.3–1.2)
Total Protein: 7.6 g/dL (ref 6.5–8.1)

## 2021-09-20 LAB — TSH: TSH: 98 u[IU]/mL — ABNORMAL HIGH (ref 0.350–4.500)

## 2021-09-20 MED ORDER — ALBUTEROL SULFATE HFA 108 (90 BASE) MCG/ACT IN AERS: 2.0000 | INHALATION_SPRAY | Freq: Four times a day (QID) | RESPIRATORY_TRACT | 2 refills | Status: DC | PRN

## 2021-09-20 MED ORDER — LEVOTHYROXINE SODIUM 100 MCG PO TABS: 100.0000 ug | ORAL_TABLET | Freq: Every day | ORAL | 2 refills | Status: DC

## 2021-09-20 MED ORDER — OXYCODONE HCL 5 MG PO TABS: 5.0000 mg | ORAL_TABLET | ORAL | 0 refills | Status: DC | PRN

## 2021-09-20 MED ORDER — SODIUM CHLORIDE 0.9 % IV SOLN
1500.0000 mg | Freq: Once | INTRAVENOUS | Status: AC
  Administered 2021-09-20: 1500 mg via INTRAVENOUS
  Filled 2021-09-20: qty 30

## 2021-09-20 MED ORDER — MORPHINE SULFATE ER 15 MG PO TBCR: 15.0000 mg | EXTENDED_RELEASE_TABLET | Freq: Two times a day (BID) | ORAL | 0 refills | Status: DC

## 2021-09-20 MED ORDER — SODIUM CHLORIDE 0.9 % IV SOLN
Freq: Once | INTRAVENOUS | Status: AC
  Filled 2021-09-20: qty 250

## 2021-09-20 MED ORDER — BUDESONIDE-FORMOTEROL FUMARATE 160-4.5 MCG/ACT IN AERO: 2.0000 | INHALATION_SPRAY | Freq: Two times a day (BID) | RESPIRATORY_TRACT | 12 refills | Status: DC

## 2021-09-20 NOTE — Progress Notes (Signed)
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  He had a chest x-ray followed by a CT scan which showed a 5.7 x 4.4 cm left upper lobe mass along with left hilar adenopathy.  There was a low-density 3.6 lesion noted in the right hepatic lobe which ultimately turned out to be a hemangioma on MRI liver.  PET CT scan showed hypermetabolic activity in the left upper lobe and left hilar nodal tissue.  Subcarinal and right paratracheal lymph nodes with mild hypermetabolic features.   Patient underwent bronchoscopies and biopsies.Left upper lobe biopsy was consistent with non-small cell carcinoma favor squamous cell carcinoma right subcarinal lymph node biopsy was negative for malignancy.   Plan is for concurrent chemoradiation with carbotaxol followed by maintenance durvalumab.  Patient does not want to get a port placed.  He is also refused Covid vaccination   NGS testing showed high tumor mutational burden.  PD-L1 90%.  MSI stable.  Patient PIK3CA and notch2 FCHO2 fusion, T p53   Scans in May 2022 Showed 2 hypermetabolic nodules in the left upper lobe and new hypermetabolic cavitary nodule in the right upper lobe concerning for disease progression.  No evidence of distant metastatic disease.  Patient was seen  by Dr. Donella Stade and received SBRT to the left lung with plans for continued monitoring of the right upper lobe lung nodule.  Patient received last Durvalumab on 05/07/2021.  Durvalumab was on hold during radiation treatment until 06/29/2021 but patient did not follow-up after that to resume treatment    Interval history-reports that he gets an intense headache every time he uses the fentanyl patch and therefore wishes to switch to an alternative long-acting pain medication.  He also uses breakthrough oxycodone for his chest wall pain.  Denies any new complaints at this time weight has remained stable.  ECOG PS- 1 Pain scale- 2 Opioid associated constipation- no  Review of systems- Review of Systems  Constitutional:  Positive for malaise/fatigue. Negative for chills, fever and weight loss.  HENT:  Negative for congestion, ear discharge and nosebleeds.   Eyes:  Negative for blurred vision.  Respiratory:  Negative for cough, hemoptysis, sputum production, shortness of breath and wheezing.        Left-sided chest wall pain  Cardiovascular:  Negative for chest pain, palpitations, orthopnea and claudication.  Gastrointestinal:  Negative for abdominal pain, blood in stool, constipation, diarrhea, heartburn, melena, nausea and vomiting.  Genitourinary:  Negative for dysuria, flank pain, frequency, hematuria and urgency.  Musculoskeletal:  Negative for back pain, joint pain and myalgias.  Skin:  Negative for rash.  Neurological:  Negative for dizziness, tingling, focal weakness, seizures, weakness and headaches.  Endo/Heme/Allergies:  Does not bruise/bleed easily.  Psychiatric/Behavioral:  Negative for depression and suicidal ideas. The patient does not have insomnia.       No  Known Allergies   Past Medical History:  Diagnosis Date   COPD (chronic obstructive pulmonary disease) ([facility])    Emphysema of lung ([place])    Lung cancer ([place]) 06/2020   Pneumonia      Past Surgical History:  Procedure  Laterality Date   BACK SURGERY  2017   lumbar region herniated disc   VIDEO BRONCHOSCOPY WITH ENDOBRONCHIAL NAVIGATION N/A 08/07/2020   Procedure: VIDEO BRONCHOSCOPY WITH ENDOBRONCHIAL NAVIGATION;  Surgeon: Tyler Pita, MD;  Location: [facility] ORS;  Service: Pulmonary;  Laterality: N/A;   VIDEO BRONCHOSCOPY WITH ENDOBRONCHIAL ULTRASOUND N/A 08/07/2020   Procedure: VIDEO BRONCHOSCOPY WITH ENDOBRONCHIAL ULTRASOUND;  Surgeon: Tyler Pita, MD;  Location: [facility] ORS;  Service: Pulmonary;  Laterality: N/A;    Social History   Socioeconomic History   Marital status: Divorced    Spouse name: Not on file   Number of children: Not on file   Years of education: Not on file   Highest education level: Not on file  Occupational History   Not on file  Tobacco Use   Smoking status: Every Day    Packs/day: 2.00    Years: 35.00    Pack years: 70.00    Types: Cigarettes   Smokeless tobacco: Former    Types: Chew   Tobacco comments:    2PPD 01/19/2021  Vaping Use   Vaping Use: Never used  Substance and Sexual Activity   Alcohol use: Yes    Alcohol/week: 4.0 standard drinks    Types: 4 Cans of beer per week   Drug use: No   Sexual activity: Yes  Other Topics Concern   Not on file  Social History Narrative   Not on file   Social Determinants of Health   Financial Resource Strain: Not on file  Food Insecurity: Not on file  Transportation Needs: Not on file  Physical Activity: Not on file  Stress: Not on file  Social Connections: Not on file  Intimate Partner Violence: Not on file    Family History  Problem Relation Age of Onset   Diabetes Father    Lung cancer Father 82   Heart attack Father    Throat cancer Maternal Aunt      Current Outpatient Medications:    acetaminophen (TYLENOL) 500 MG tablet, Take 500-1,000 mg by mouth every 6 (six) hours as needed (for pain.)., Disp: , Rfl:    albuterol (VENTOLIN HFA) 108 (90 Base) MCG/ACT inhaler, Inhale 2 puffs into the lungs  every 6 (six) hours as needed for wheezing or shortness of breath., Disp: 8 g, Rfl: 2   budesonide-formoterol (SYMBICORT) 160-4.5 MCG/ACT inhaler, Inhale 2 puffs into the lungs in the morning and at bedtime., Disp: 1 each, Rfl: 12   fentaNYL (DURAGESIC) 12 MCG/HR, Place 1 patch onto the skin every 3 (three) days., Disp: 10 patch, Rfl: 0   levothyroxine (SYNTHROID) 50 MCG tablet, Take 1 tablet (50 mcg total) by mouth daily before breakfast., Disp: 30 tablet, Rfl: 1   oxyCODONE (OXY IR/ROXICODONE) 5 MG immediate release tablet, Take 1 tablet (5 mg total) by mouth every 4 (four) hours as needed for severe pain., Disp: 30 tablet, Rfl: 0  Physical exam:  Vitals:   09/20/21 0914  BP: 106/70  Pulse: 72  Resp: 16  Temp: 98.1 F (36.7 C)  TempSrc: Oral  Weight: 185 lb (83.9 kg)  Height: 6' 2"  (1.88 m)   Physical Exam Cardiovascular:     Rate and Rhythm: Normal rate and regular rhythm.  Heart sounds: Normal heart sounds.  Pulmonary:     Effort: Pulmonary effort is normal.     Breath sounds: Normal breath sounds.  Abdominal:     General: Bowel sounds are normal.     Palpations: Abdomen is soft.  Skin:    General: Skin is warm and dry.  Neurological:     Mental Status: He is alert and oriented to person, place, and time.     CMP Latest Ref Rng & Units 09/20/2021  Glucose 70 - 99 mg/dL 93  BUN 6 - 20 mg/dL 7  Creatinine 0.61 - 1.24 mg/dL 0.78  Sodium 135 - 145 mmol/L 130(L)  Potassium 3.5 - 5.1 mmol/L 4.6  Chloride 98 - 111 mmol/L 98  CO2 22 - 32 mmol/L 26  Calcium 8.9 - 10.3 mg/dL 9.0  Total Protein 6.5 - 8.1 g/dL 7.6  Total Bilirubin 0.3 - 1.2 mg/dL 0.9  Alkaline Phos 38 - 126 U/L 57  AST 15 - 41 U/L 28  ALT 0 - 44 U/L 22   CBC Latest Ref Rng & Units 09/20/2021  WBC 4.0 - 10.5 K/uL 7.6  Hemoglobin 13.0 - 17.0 g/dL 13.2  Hematocrit 39.0 - 52.0 % 38.7(L)  Platelets 150 - 400 K/uL 284     Assessment and plan- Patient is a 55 y.o. male with h/o stage III squamous cell  carcinoma of the left upper lobe T3 N1 M0.  He is here for on treatment assessment prior to cycle 14 of maintenance durvalumab  Counts okay to proceed with cycle 14 of maintenance durvalumab again I will see him back in 4 weeks for cycle 15.He will be due for repeat CT chest abdomen pelvis with contrast in November 2022.  Plan is to complete maintenance durvalumab until January 2023.  So far he is tolerating treatment well without any significant side effects.  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  He is currently on low-dose levothyroxine at 50 mcg.  Depending on today's levels I will adjust it further.  Neoplasm related pain: Continue as needed oxycodone we will switch him from fentanyl patch to either long-acting morphine 15 mg twice daily or OxyContin 10 mg twice daily based on what his insurance approves   Visit Diagnosis 1. Malignant neoplasm of unspecified part of unspecified bronchus or lung ([place])   2. Encounter for antineoplastic immunotherapy   3. Autoimmune hypothyroidism   4. Neoplasm related pain      Dr. Randa Evens, MD, MPH [facility] at [facility] [phone] 09/20/2021 9:50 AM

## 2021-09-20 NOTE — Progress Notes (Signed)
Pt states he has tried  duraexic patch x 2 different times and he gets a bad HA each time

## 2021-09-20 NOTE — Patient Instructions (Signed)
[facility] ONCOLOGY  Discharge Instructions: Thank you for choosing [facility] to provide your oncology and hematology care.  If you have a lab appointment with the [facility], please go directly to the [facility] and check in at the registration area.  Wear comfortable clothing and clothing appropriate for easy access to any Portacath or PICC line.   We strive to give you quality time with your provider. You may need to reschedule your appointment if you arrive late (15 or more minutes).  Arriving late affects you and other patients whose appointments are after yours.  Also, if you miss three or more appointments without notifying the office, you may be dismissed from the clinic at the provider's discretion.      For prescription refill requests, have your pharmacy contact our office and allow 72 hours for refills to be completed.    Today you received the following chemotherapy and/or immunotherapy agents Durvalumab      To help prevent nausea and vomiting after your treatment, we encourage you to take your nausea medication as directed.  BELOW ARE SYMPTOMS THAT SHOULD BE REPORTED IMMEDIATELY: *FEVER GREATER THAN 100.4 F (38 C) OR HIGHER *CHILLS OR SWEATING *NAUSEA AND VOMITING THAT IS NOT CONTROLLED WITH YOUR NAUSEA MEDICATION *UNUSUAL SHORTNESS OF BREATH *UNUSUAL BRUISING OR BLEEDING *URINARY PROBLEMS (pain or burning when urinating, or frequent urination) *BOWEL PROBLEMS (unusual diarrhea, constipation, pain near the anus) TENDERNESS IN MOUTH AND THROAT WITH OR WITHOUT PRESENCE OF ULCERS (sore throat, sores in mouth, or a toothache) UNUSUAL RASH, SWELLING OR PAIN  UNUSUAL VAGINAL DISCHARGE OR ITCHING   Items with * indicate a potential emergency and should be followed up as soon as possible or go to the Emergency Department if any problems should occur.  Please show the CHEMOTHERAPY ALERT CARD or IMMUNOTHERAPY ALERT CARD at check-in  to the Emergency Department and triage nurse.  Should you have questions after your visit or need to cancel or reschedule your appointment, please contact [facility]  [phone] and follow the prompts.  Office hours are 8:00 a.m. to 4:30 p.m. Monday - Friday. Please note that voicemails left after 4:00 p.m. may not be returned until the following business day.  We are closed weekends and major holidays. You have access to a nurse at all times for urgent questions. Please call the main number to the clinic [phone] and follow the prompts.  For any non-urgent questions, you may also contact your provider using MyChart. We now offer e-Visits for anyone 59 and older to request care online for non-urgent symptoms. For details visit [email or web address].   Also download the MyChart app! Go to the app store, search "MyChart", open the app, select [facility], and log in with your MyChart username and password.  Due to Covid, a mask is required upon entering the hospital/clinic. If you do not have a mask, one will be given to you upon arrival. For doctor visits, patients may have 1 support person aged 50 or older with them. For treatment visits, patients cannot have anyone with them due to current Covid guidelines and our immunocompromised population.

## 2021-09-21 LAB — T4: T4, Total: 4.1 ug/dL — ABNORMAL LOW (ref 4.5–12.0)

## 2021-09-28 ENCOUNTER — Other Ambulatory Visit: Payer: Self-pay | Admitting: Oncology

## 2021-10-11 ENCOUNTER — Other Ambulatory Visit: Payer: Self-pay | Admitting: Oncology

## 2021-10-11 MED ORDER — OXYCODONE HCL 5 MG PO TABS: 5.0000 mg | ORAL_TABLET | ORAL | 0 refills | Status: DC | PRN

## 2021-10-12 ENCOUNTER — Encounter: Payer: Self-pay | Admitting: Oncology

## 2021-10-15 ENCOUNTER — Ambulatory Visit: Payer: Medicaid Other | Admitting: Radiation Oncology

## 2021-10-19 ENCOUNTER — Encounter: Payer: Self-pay | Admitting: Oncology

## 2021-10-19 ENCOUNTER — Inpatient Hospital Stay: Payer: Medicaid Other | Attending: Oncology

## 2021-10-19 ENCOUNTER — Inpatient Hospital Stay: Payer: Medicaid Other

## 2021-10-19 ENCOUNTER — Other Ambulatory Visit: Payer: Self-pay

## 2021-10-19 ENCOUNTER — Telehealth: Payer: Self-pay

## 2021-10-19 ENCOUNTER — Inpatient Hospital Stay (HOSPITAL_BASED_OUTPATIENT_CLINIC_OR_DEPARTMENT_OTHER): Payer: Medicaid Other | Admitting: Oncology

## 2021-10-19 VITALS — BP 108/74 | HR 70 | Temp 97.1°F | Resp 18 | Wt 191.1 lb

## 2021-10-19 DIAGNOSIS — Z5112 Encounter for antineoplastic immunotherapy: Secondary | ICD-10-CM | POA: Insufficient documentation

## 2021-10-19 DIAGNOSIS — Z8249 Family history of ischemic heart disease and other diseases of the circulatory system: Secondary | ICD-10-CM | POA: Insufficient documentation

## 2021-10-19 DIAGNOSIS — F17210 Nicotine dependence, cigarettes, uncomplicated: Secondary | ICD-10-CM | POA: Diagnosis not present

## 2021-10-19 DIAGNOSIS — R590 Localized enlarged lymph nodes: Secondary | ICD-10-CM | POA: Diagnosis not present

## 2021-10-19 DIAGNOSIS — Z808 Family history of malignant neoplasm of other organs or systems: Secondary | ICD-10-CM | POA: Diagnosis not present

## 2021-10-19 DIAGNOSIS — R5383 Other fatigue: Secondary | ICD-10-CM | POA: Insufficient documentation

## 2021-10-19 DIAGNOSIS — Z833 Family history of diabetes mellitus: Secondary | ICD-10-CM | POA: Insufficient documentation

## 2021-10-19 DIAGNOSIS — C3412 Malignant neoplasm of upper lobe, left bronchus or lung: Secondary | ICD-10-CM | POA: Diagnosis present

## 2021-10-19 DIAGNOSIS — E063 Autoimmune thyroiditis: Secondary | ICD-10-CM | POA: Diagnosis not present

## 2021-10-19 DIAGNOSIS — D1803 Hemangioma of intra-abdominal structures: Secondary | ICD-10-CM | POA: Diagnosis not present

## 2021-10-19 DIAGNOSIS — Z801 Family history of malignant neoplasm of trachea, bronchus and lung: Secondary | ICD-10-CM | POA: Diagnosis not present

## 2021-10-19 DIAGNOSIS — G893 Neoplasm related pain (acute) (chronic): Secondary | ICD-10-CM | POA: Insufficient documentation

## 2021-10-19 DIAGNOSIS — R079 Chest pain, unspecified: Secondary | ICD-10-CM | POA: Diagnosis not present

## 2021-10-19 DIAGNOSIS — Z28310 Unvaccinated for covid-19: Secondary | ICD-10-CM | POA: Insufficient documentation

## 2021-10-19 DIAGNOSIS — R0602 Shortness of breath: Secondary | ICD-10-CM | POA: Diagnosis not present

## 2021-10-19 DIAGNOSIS — C3490 Malignant neoplasm of unspecified part of unspecified bronchus or lung: Secondary | ICD-10-CM | POA: Diagnosis not present

## 2021-10-19 DIAGNOSIS — J439 Emphysema, unspecified: Secondary | ICD-10-CM | POA: Insufficient documentation

## 2021-10-19 LAB — CBC WITH DIFFERENTIAL/PLATELET
Abs Immature Granulocytes: 0.03 10*3/uL (ref 0.00–0.07)
Basophils Absolute: 0.1 10*3/uL (ref 0.0–0.1)
Basophils Relative: 1 %
Eosinophils Absolute: 0.5 10*3/uL (ref 0.0–0.5)
Eosinophils Relative: 7 %
HCT: 37.9 % — ABNORMAL LOW (ref 39.0–52.0)
Hemoglobin: 12.8 g/dL — ABNORMAL LOW (ref 13.0–17.0)
Immature Granulocytes: 0 %
Lymphocytes Relative: 12 %
Lymphs Abs: 0.9 10*3/uL (ref 0.7–4.0)
MCH: 31.1 pg (ref 26.0–34.0)
MCHC: 33.8 g/dL (ref 30.0–36.0)
MCV: 92.2 fL (ref 80.0–100.0)
Monocytes Absolute: 0.8 10*3/uL (ref 0.1–1.0)
Monocytes Relative: 11 %
Neutro Abs: 5.1 10*3/uL (ref 1.7–7.7)
Neutrophils Relative %: 69 %
Platelets: 285 10*3/uL (ref 150–400)
RBC: 4.11 MIL/uL — ABNORMAL LOW (ref 4.22–5.81)
RDW: 14.3 % (ref 11.5–15.5)
WBC: 7.4 10*3/uL (ref 4.0–10.5)
nRBC: 0 % (ref 0.0–0.2)

## 2021-10-19 LAB — COMPREHENSIVE METABOLIC PANEL
ALT: 15 U/L (ref 0–44)
AST: 18 U/L (ref 15–41)
Albumin: 4.3 g/dL (ref 3.5–5.0)
Alkaline Phosphatase: 52 U/L (ref 38–126)
Anion gap: 8 (ref 5–15)
BUN: 7 mg/dL (ref 6–20)
CO2: 26 mmol/L (ref 22–32)
Calcium: 9.1 mg/dL (ref 8.9–10.3)
Chloride: 97 mmol/L — ABNORMAL LOW (ref 98–111)
Creatinine, Ser: 0.84 mg/dL (ref 0.61–1.24)
GFR, Estimated: >60 mL/min (ref >60)
Glucose, Bld: 99 mg/dL (ref 70–99)
Potassium: 4.3 mmol/L (ref 3.5–5.1)
Sodium: 131 mmol/L — ABNORMAL LOW (ref 135–145)
Total Bilirubin: 0.6 mg/dL (ref 0.3–1.2)
Total Protein: 7.2 g/dL (ref 6.5–8.1)

## 2021-10-19 LAB — TSH: TSH: 47 u[IU]/mL — ABNORMAL HIGH (ref 0.350–4.500)

## 2021-10-19 MED ORDER — SODIUM CHLORIDE 0.9 % IV SOLN
1500.0000 mg | Freq: Once | INTRAVENOUS | Status: AC
  Administered 2021-10-19: 1500 mg via INTRAVENOUS
  Filled 2021-10-19: qty 30

## 2021-10-19 MED ORDER — LEVOTHYROXINE SODIUM 125 MCG PO TABS: 125.0000 ug | ORAL_TABLET | Freq: Every day | ORAL | 0 refills | Status: DC

## 2021-10-19 MED ORDER — SODIUM CHLORIDE 0.9 % IV SOLN
Freq: Once | INTRAVENOUS | Status: AC
  Filled 2021-10-19: qty 250

## 2021-10-19 MED ORDER — HEPARIN SOD (PORK) LOCK FLUSH 100 UNIT/ML IV SOLN
INTRAVENOUS | Status: AC
  Filled 2021-10-19: qty 5

## 2021-10-19 NOTE — Patient Instructions (Signed)
[facility] ONCOLOGY  Discharge Instructions: Thank you for choosing [facility] to provide your oncology and hematology care.  If you have a lab appointment with the [facility], please go directly to the [facility] and check in at the registration area.  Wear comfortable clothing and clothing appropriate for easy access to any Portacath or PICC line.   We strive to give you quality time with your provider. You may need to reschedule your appointment if you arrive late (15 or more minutes).  Arriving late affects you and other patients whose appointments are after yours.  Also, if you miss three or more appointments without notifying the office, you may be dismissed from the clinic at the provider's discretion.      For prescription refill requests, have your pharmacy contact our office and allow 72 hours for refills to be completed.    Today you received the following chemotherapy and/or immunotherapy agents Imfinzi       To help prevent nausea and vomiting after your treatment, we encourage you to take your nausea medication as directed.  BELOW ARE SYMPTOMS THAT SHOULD BE REPORTED IMMEDIATELY: *FEVER GREATER THAN 100.4 F (38 C) OR HIGHER *CHILLS OR SWEATING *NAUSEA AND VOMITING THAT IS NOT CONTROLLED WITH YOUR NAUSEA MEDICATION *UNUSUAL SHORTNESS OF BREATH *UNUSUAL BRUISING OR BLEEDING *URINARY PROBLEMS (pain or burning when urinating, or frequent urination) *BOWEL PROBLEMS (unusual diarrhea, constipation, pain near the anus) TENDERNESS IN MOUTH AND THROAT WITH OR WITHOUT PRESENCE OF ULCERS (sore throat, sores in mouth, or a toothache) UNUSUAL RASH, SWELLING OR PAIN  UNUSUAL VAGINAL DISCHARGE OR ITCHING   Items with * indicate a potential emergency and should be followed up as soon as possible or go to the Emergency Department if any problems should occur.  Please show the CHEMOTHERAPY ALERT CARD or IMMUNOTHERAPY ALERT CARD at check-in to  the Emergency Department and triage nurse.  Should you have questions after your visit or need to cancel or reschedule your appointment, please contact [facility]  [phone] and follow the prompts.  Office hours are 8:00 a.m. to 4:30 p.m. Monday - Friday. Please note that voicemails left after 4:00 p.m. may not be returned until the following business day.  We are closed weekends and major holidays. You have access to a nurse at all times for urgent questions. Please call the main number to the clinic [phone] and follow the prompts.  For any non-urgent questions, you may also contact your provider using MyChart. We now offer e-Visits for anyone 4 and older to request care online for non-urgent symptoms. For details visit [email or web address].   Also download the MyChart app! Go to the app store, search "MyChart", open the app, select [facility], and log in with your MyChart username and password.  Due to Covid, a mask is required upon entering the hospital/clinic. If you do not have a mask, one will be given to you upon arrival. For doctor visits, patients may have 1 support person aged 3 or older with them. For treatment visits, patients cannot have anyone with them due to current Covid guidelines and our immunocompromised population.

## 2021-10-19 NOTE — Progress Notes (Signed)
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  He had a chest x-ray followed by a CT scan which showed a 5.7 x 4.4 cm left upper lobe mass along with left hilar adenopathy.  There was a low-density 3.6 lesion noted in the right hepatic lobe which ultimately turned out to be a hemangioma on MRI liver.  PET CT scan showed hypermetabolic activity in the left upper lobe and left hilar nodal tissue.  Subcarinal and right paratracheal lymph nodes with mild hypermetabolic features.   Patient underwent bronchoscopies and biopsies.Left upper lobe biopsy was consistent with non-small cell carcinoma favor squamous cell carcinoma right subcarinal lymph node biopsy was negative for malignancy.   Plan is for concurrent chemoradiation with carbotaxol followed by maintenance durvalumab.  Patient does not want to get a port placed.  He is also refused Covid vaccination   NGS testing showed high tumor mutational burden.  PD-L1 90%.  MSI stable.  Patient PIK3CA and notch2 FCHO2 fusion, T p53   Scans in May 2022 Showed 2 hypermetabolic nodules in the left upper lobe and new hypermetabolic cavitary nodule in the right upper lobe concerning for disease progression.  No evidence of distant metastatic disease.  Patient was  seen by Dr. Donella Stade and received SBRT to the left lung with plans for continued monitoring of the right upper lobe lung nodule.  Patient received last Durvalumab on 05/07/2021.  Durvalumab was on hold during radiation treatment until 06/29/2021 but patient did not follow-up after that to resume treatment    Interval history-he has baseline fatigue and exertional shortness of breath which is unchanged.Currently using as needed oxycodone and morphine long-acting for his chest wall pain which is under better control.  ECOG PS- 1 Pain scale- 0   Review of systems- Review of Systems  Constitutional:  Positive for malaise/fatigue. Negative for chills, fever and weight loss.  HENT:  Negative for congestion, ear discharge and nosebleeds.   Eyes:  Negative for blurred vision.  Respiratory:  Positive for shortness of breath. Negative for cough, hemoptysis, sputum production and wheezing.   Cardiovascular:  Positive for chest pain. Negative for palpitations, orthopnea and claudication.  Gastrointestinal:  Negative for abdominal pain, blood in stool, constipation, diarrhea, heartburn, melena, nausea and vomiting.  Genitourinary:  Negative for dysuria, flank pain, frequency, hematuria and urgency.  Musculoskeletal:  Negative for back pain, joint pain and myalgias.  Skin:  Negative for rash.  Neurological:  Negative for dizziness, tingling, focal weakness, seizures, weakness and headaches.  Endo/Heme/Allergies:  Does not bruise/bleed easily.  Psychiatric/Behavioral:  Negative for depression and suicidal ideas. The patient does not have insomnia.      No Known Allergies   Past Medical History:  Diagnosis Date   COPD (chronic obstructive pulmonary disease) ([place])    Emphysema of lung ([place])  Lung cancer ([place]) 06/2020   Pneumonia      Past Surgical History:  Procedure Laterality Date   BACK SURGERY  2017   lumbar region herniated disc   VIDEO BRONCHOSCOPY WITH ENDOBRONCHIAL NAVIGATION N/A  08/07/2020   Procedure: VIDEO BRONCHOSCOPY WITH ENDOBRONCHIAL NAVIGATION;  Surgeon: Tyler Pita, MD;  Location: [facility] ORS;  Service: Pulmonary;  Laterality: N/A;   VIDEO BRONCHOSCOPY WITH ENDOBRONCHIAL ULTRASOUND N/A 08/07/2020   Procedure: VIDEO BRONCHOSCOPY WITH ENDOBRONCHIAL ULTRASOUND;  Surgeon: Tyler Pita, MD;  Location: [facility] ORS;  Service: Pulmonary;  Laterality: N/A;    Social History   Socioeconomic History   Marital status: Divorced    Spouse name: Not on file   Number of children: Not on file   Years of education: Not on file   Highest education level: Not on file  Occupational History   Not on file  Tobacco Use   Smoking status: Every Day    Packs/day: 2.00    Years: 35.00    Pack years: 70.00    Types: Cigarettes   Smokeless tobacco: Former    Types: Chew   Tobacco comments:    2PPD 01/19/2021  Vaping Use   Vaping Use: Never used  Substance and Sexual Activity   Alcohol use: Yes    Alcohol/week: 4.0 standard drinks    Types: 4 Cans of beer per week   Drug use: No   Sexual activity: Yes  Other Topics Concern   Not on file  Social History Narrative   Not on file   Social Determinants of Health   Financial Resource Strain: Not on file  Food Insecurity: Not on file  Transportation Needs: Not on file  Physical Activity: Not on file  Stress: Not on file  Social Connections: Not on file  Intimate Partner Violence: Not on file    Family History  Problem Relation Age of Onset   Diabetes Father    Lung cancer Father 66   Heart attack Father    Throat cancer Maternal Aunt      Current Outpatient Medications:    acetaminophen (TYLENOL) 500 MG tablet, Take 500-1,000 mg by mouth every 6 (six) hours as needed (for pain.)., Disp: , Rfl:    albuterol (VENTOLIN HFA) 108 (90 Base) MCG/ACT inhaler, Inhale 2 puffs into the lungs every 6 (six) hours as needed for wheezing or shortness of breath., Disp: 8 g, Rfl: 2   budesonide-formoterol (SYMBICORT)  160-4.5 MCG/ACT inhaler, Inhale 2 puffs into the lungs in the morning and at bedtime., Disp: 1 each, Rfl: 12   oxyCODONE (OXY IR/ROXICODONE) 5 MG immediate release tablet, Take 1 tablet (5 mg total) by mouth every 4 (four) hours as needed for severe pain., Disp: 60 tablet, Rfl: 0   levothyroxine (SYNTHROID) 125 MCG tablet, Take 1 tablet (125 mcg total) by mouth daily before breakfast., Disp: 30 tablet, Rfl: 0   morphine (MS CONTIN) 15 MG 12 hr tablet, Take 1 tablet (15 mg total) by mouth every 12 (twelve) hours. (Patient not taking: Reported on 10/19/2021), Disp: 60 tablet, Rfl: 0  Physical exam:  Vitals:   10/19/21 0851  BP: 108/74  Pulse: 70  Resp: 18  Temp: (!) 97.1 F (36.2 C)  SpO2: 100%  Weight: 191 lb 1.6 oz (86.7 kg)   Physical Exam Cardiovascular:     Rate and Rhythm: Normal rate and regular rhythm.     Heart sounds: Normal heart sounds.  Pulmonary:     Effort: Pulmonary effort is  normal.     Breath sounds: Normal breath sounds.  Abdominal:     General: Bowel sounds are normal.     Palpations: Abdomen is soft.  Skin:    General: Skin is warm and dry.  Neurological:     Mental Status: He is alert and oriented to person, place, and time.     CMP Latest Ref Rng & Units 10/19/2021  Glucose 70 - 99 mg/dL 99  BUN 6 - 20 mg/dL 7  Creatinine 0.61 - 1.24 mg/dL 0.84  Sodium 135 - 145 mmol/L 131(L)  Potassium 3.5 - 5.1 mmol/L 4.3  Chloride 98 - 111 mmol/L 97(L)  CO2 22 - 32 mmol/L 26  Calcium 8.9 - 10.3 mg/dL 9.1  Total Protein 6.5 - 8.1 g/dL 7.2  Total Bilirubin 0.3 - 1.2 mg/dL 0.6  Alkaline Phos 38 - 126 U/L 52  AST 15 - 41 U/L 18  ALT 0 - 44 U/L 15   CBC Latest Ref Rng & Units 10/19/2021  WBC 4.0 - 10.5 K/uL 7.4  Hemoglobin 13.0 - 17.0 g/dL 12.8(L)  Hematocrit 39.0 - 52.0 % 37.9(L)  Platelets 150 - 400 K/uL 285    Assessment and plan- Patient is a 55 y.o. male with h/o stage III squamous cell carcinoma of the left upper lobe T3 N1 M0.  He is here for on  treatment assessment prior to cycle 15 of maintenance durvalumab  Counts ok to  proceed with cycle 15 of maintenance durvalumab today.  He is currently getting it every 4 weeks and will be getting it until January 2023.  We will see him back in 4 weeks for cycle 16 and he will need a CT chest abdomen pelvis with contrast and bone scan prior.  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  TSH remains elevated at 47.  We will increase levothyroxine dosage to 125 mcg.  Neoplasm related pain: Continue as needed oxycodone and long-acting morphine   Visit Diagnosis 1. Malignant neoplasm of unspecified part of unspecified bronchus or lung ([place])   2. Encounter for antineoplastic immunotherapy   3. Autoimmune hypothyroidism      Dr. Randa Evens, MD, MPH [facility] [phone] 10/19/2021 1:08 PM

## 2021-10-19 NOTE — Telephone Encounter (Signed)
Pt is aware of new rx sent to [facility] on [address] for an increased dose of levothyroxine to 125 mcg per. Dr. Janese Banks

## 2021-10-31 ENCOUNTER — Other Ambulatory Visit: Payer: Self-pay | Admitting: Oncology

## 2021-11-01 ENCOUNTER — Encounter: Payer: Self-pay | Admitting: Oncology

## 2021-11-01 MED ORDER — OXYCODONE HCL 5 MG PO TABS: 5.0000 mg | ORAL_TABLET | ORAL | 0 refills | Status: DC | PRN

## 2021-11-02 ENCOUNTER — Encounter: Payer: Self-pay | Admitting: Oncology

## 2021-11-11 ENCOUNTER — Ambulatory Visit
Admission: RE | Admit: 2021-11-11 | Discharge: 2021-11-11 | Disposition: A | Discharge status: Home / Self Care | Payer: Medicaid Other | Source: Ambulatory Visit | Attending: Oncology | Admitting: Oncology

## 2021-11-11 ENCOUNTER — Encounter
Admission: RE | Admit: 2021-11-11 | Discharge: 2021-11-11 | Disposition: A | Discharge status: Home / Self Care | Payer: Medicaid Other | Source: Ambulatory Visit | Attending: Oncology | Admitting: Oncology

## 2021-11-11 ENCOUNTER — Other Ambulatory Visit: Payer: Self-pay

## 2021-11-11 DIAGNOSIS — C3490 Malignant neoplasm of unspecified part of unspecified bronchus or lung: Secondary | ICD-10-CM | POA: Diagnosis not present

## 2021-11-11 DIAGNOSIS — C3412 Malignant neoplasm of upper lobe, left bronchus or lung: Secondary | ICD-10-CM | POA: Diagnosis not present

## 2021-11-11 DIAGNOSIS — M19071 Primary osteoarthritis, right ankle and foot: Secondary | ICD-10-CM | POA: Diagnosis not present

## 2021-11-11 DIAGNOSIS — M19011 Primary osteoarthritis, right shoulder: Secondary | ICD-10-CM | POA: Diagnosis not present

## 2021-11-11 MED ORDER — TECHNETIUM TC 99M MEDRONATE IV KIT
20.0000 | PACK | Freq: Once | INTRAVENOUS | Status: AC | PRN
  Administered 2021-11-11: 10:00:00 20.24 via INTRAVENOUS

## 2021-11-11 MED ORDER — IOHEXOL 300 MG/ML  SOLN
100.0000 mL | Freq: Once | INTRAMUSCULAR | Status: AC | PRN
  Administered 2021-11-11: 10:00:00 100 mL via INTRAVENOUS

## 2021-11-16 ENCOUNTER — Inpatient Hospital Stay: Payer: Medicaid Other | Attending: Oncology

## 2021-11-16 ENCOUNTER — Inpatient Hospital Stay (HOSPITAL_BASED_OUTPATIENT_CLINIC_OR_DEPARTMENT_OTHER): Payer: Medicaid Other | Admitting: Oncology

## 2021-11-16 ENCOUNTER — Inpatient Hospital Stay: Payer: Medicaid Other

## 2021-11-16 ENCOUNTER — Other Ambulatory Visit: Payer: Self-pay

## 2021-11-16 ENCOUNTER — Encounter: Payer: Self-pay | Admitting: Oncology

## 2021-11-16 VITALS — BP 98/64 | HR 71 | Temp 96.2°F | Resp 18 | Wt 188.6 lb

## 2021-11-16 DIAGNOSIS — C3412 Malignant neoplasm of upper lobe, left bronchus or lung: Secondary | ICD-10-CM | POA: Insufficient documentation

## 2021-11-16 DIAGNOSIS — F17210 Nicotine dependence, cigarettes, uncomplicated: Secondary | ICD-10-CM | POA: Diagnosis not present

## 2021-11-16 DIAGNOSIS — Z7289 Other problems related to lifestyle: Secondary | ICD-10-CM | POA: Diagnosis not present

## 2021-11-16 DIAGNOSIS — Z5112 Encounter for antineoplastic immunotherapy: Secondary | ICD-10-CM

## 2021-11-16 DIAGNOSIS — D1803 Hemangioma of intra-abdominal structures: Secondary | ICD-10-CM | POA: Diagnosis not present

## 2021-11-16 DIAGNOSIS — E063 Autoimmune thyroiditis: Secondary | ICD-10-CM | POA: Insufficient documentation

## 2021-11-16 DIAGNOSIS — Z801 Family history of malignant neoplasm of trachea, bronchus and lung: Secondary | ICD-10-CM | POA: Insufficient documentation

## 2021-11-16 DIAGNOSIS — Z8249 Family history of ischemic heart disease and other diseases of the circulatory system: Secondary | ICD-10-CM | POA: Diagnosis not present

## 2021-11-16 DIAGNOSIS — Z808 Family history of malignant neoplasm of other organs or systems: Secondary | ICD-10-CM | POA: Insufficient documentation

## 2021-11-16 DIAGNOSIS — R0789 Other chest pain: Secondary | ICD-10-CM | POA: Insufficient documentation

## 2021-11-16 DIAGNOSIS — Z9221 Personal history of antineoplastic chemotherapy: Secondary | ICD-10-CM | POA: Insufficient documentation

## 2021-11-16 DIAGNOSIS — Z7951 Long term (current) use of inhaled steroids: Secondary | ICD-10-CM | POA: Diagnosis not present

## 2021-11-16 DIAGNOSIS — Z28310 Unvaccinated for covid-19: Secondary | ICD-10-CM | POA: Insufficient documentation

## 2021-11-16 DIAGNOSIS — Z833 Family history of diabetes mellitus: Secondary | ICD-10-CM | POA: Diagnosis not present

## 2021-11-16 DIAGNOSIS — C3490 Malignant neoplasm of unspecified part of unspecified bronchus or lung: Secondary | ICD-10-CM

## 2021-11-16 DIAGNOSIS — I3139 Other pericardial effusion (noninflammatory): Secondary | ICD-10-CM | POA: Insufficient documentation

## 2021-11-16 LAB — CBC WITH DIFFERENTIAL/PLATELET
Abs Immature Granulocytes: 0.03 10*3/uL (ref 0.00–0.07)
Basophils Absolute: 0.1 10*3/uL (ref 0.0–0.1)
Basophils Relative: 1 %
Eosinophils Absolute: 0.4 10*3/uL (ref 0.0–0.5)
Eosinophils Relative: 4 %
HCT: 39.7 % (ref 39.0–52.0)
Hemoglobin: 13.3 g/dL (ref 13.0–17.0)
Immature Granulocytes: 0 %
Lymphocytes Relative: 12 %
Lymphs Abs: 1 10*3/uL (ref 0.7–4.0)
MCH: 30.9 pg (ref 26.0–34.0)
MCHC: 33.5 g/dL (ref 30.0–36.0)
MCV: 92.3 fL (ref 80.0–100.0)
Monocytes Absolute: 0.8 10*3/uL (ref 0.1–1.0)
Monocytes Relative: 10 %
Neutro Abs: 6.1 10*3/uL (ref 1.7–7.7)
Neutrophils Relative %: 73 %
Platelets: 285 10*3/uL (ref 150–400)
RBC: 4.3 MIL/uL (ref 4.22–5.81)
RDW: 13.1 % (ref 11.5–15.5)
WBC: 8.4 10*3/uL (ref 4.0–10.5)
nRBC: 0 % (ref 0.0–0.2)

## 2021-11-16 LAB — COMPREHENSIVE METABOLIC PANEL
ALT: 11 U/L (ref 0–44)
AST: 17 U/L (ref 15–41)
Albumin: 4 g/dL (ref 3.5–5.0)
Alkaline Phosphatase: 51 U/L (ref 38–126)
Anion gap: 10 (ref 5–15)
BUN: 10 mg/dL (ref 6–20)
CO2: 24 mmol/L (ref 22–32)
Calcium: 8.9 mg/dL (ref 8.9–10.3)
Chloride: 99 mmol/L (ref 98–111)
Creatinine, Ser: 0.82 mg/dL (ref 0.61–1.24)
GFR, Estimated: >60 mL/min (ref >60)
Glucose, Bld: 109 mg/dL — ABNORMAL HIGH (ref 70–99)
Potassium: 4 mmol/L (ref 3.5–5.1)
Sodium: 133 mmol/L — ABNORMAL LOW (ref 135–145)
Total Bilirubin: 0.2 mg/dL — ABNORMAL LOW (ref 0.3–1.2)
Total Protein: 7.4 g/dL (ref 6.5–8.1)

## 2021-11-16 MED ORDER — SODIUM CHLORIDE 0.9 % IV SOLN
Freq: Once | INTRAVENOUS | Status: AC
  Filled 2021-11-16: qty 250

## 2021-11-16 MED ORDER — SODIUM CHLORIDE 0.9 % IV SOLN
1500.0000 mg | Freq: Once | INTRAVENOUS | Status: AC
  Administered 2021-11-16: 1500 mg via INTRAVENOUS
  Filled 2021-11-16: qty 30

## 2021-11-16 MED ORDER — LEVOTHYROXINE SODIUM 125 MCG PO TABS: 125.0000 ug | ORAL_TABLET | Freq: Every day | ORAL | 0 refills | Status: DC

## 2021-11-16 NOTE — Progress Notes (Signed)
Pt has no new concerns for todays visit.

## 2021-11-16 NOTE — Progress Notes (Signed)
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  He had a chest x-ray followed by a CT scan which showed a 5.7 x 4.4 cm left upper lobe mass along with left hilar adenopathy.  There was a low-density 3.6 lesion noted in the right hepatic lobe which ultimately turned out to be a hemangioma on MRI liver.  PET CT scan showed hypermetabolic activity in the left upper lobe and left hilar nodal tissue.  Subcarinal and right paratracheal lymph nodes with mild hypermetabolic features.   Patient underwent bronchoscopies and biopsies.Left upper lobe biopsy was consistent with non-small cell carcinoma favor squamous cell carcinoma right subcarinal lymph node biopsy was negative for malignancy.   Plan is for concurrent chemoradiation with carbotaxol followed by maintenance durvalumab.  Patient does not want to get a port placed.  He is also refused Covid vaccination   NGS testing showed high tumor mutational burden.  PD-L1 90%.  MSI stable.  Patient PIK3CA and notch2 FCHO2 fusion, T p53   Scans in May 2022 Showed 2 hypermetabolic nodules in the left upper lobe and new hypermetabolic cavitary nodule in the right upper lobe concerning for disease progression.  No evidence of distant metastatic disease.  Patient was  seen by Dr. Donella Stade and received SBRT to the left lung with plans for continued monitoring of the right upper lobe lung nodule.  Patient received last Durvalumab on 05/07/2021.  Durvalumab was on hold during radiation treatment until 06/29/2021 but patient did not follow-up after that to resume treatment      Interval history-he is doing well overall.  Chest wall pain is better controlled with morphine long-acting and as needed oxycodone.  Bowel movements are regular.  Reports that he is compliant with levothyroxine.  ECOG PS- 1 Pain scale- 0 Opioid associated constipation- no  Review of systems- Review of Systems  Constitutional:  Negative for chills, fever, malaise/fatigue and weight loss.  HENT:  Negative for congestion, ear discharge and nosebleeds.   Eyes:  Negative for blurred vision.  Respiratory:  Negative for cough, hemoptysis, sputum production, shortness of breath and wheezing.   Cardiovascular:  Negative for chest pain, palpitations, orthopnea and claudication.  Gastrointestinal:  Negative for abdominal pain, blood in stool, constipation, diarrhea, heartburn, melena, nausea and vomiting.  Genitourinary:  Negative for dysuria, flank pain, frequency, hematuria and urgency.  Musculoskeletal:  Negative for back pain, joint pain and myalgias.  Skin:  Negative for rash.  Neurological:  Negative for dizziness, tingling, focal weakness, seizures, weakness and headaches.  Endo/Heme/Allergies:  Does not bruise/bleed easily.  Psychiatric/Behavioral:  Negative for depression and suicidal ideas. The patient does not have insomnia.      No Known Allergies   Past Medical History:  Diagnosis Date   COPD (chronic obstructive pulmonary disease) ([place])    Emphysema of lung ([place])  Lung cancer ([place]) 06/2020   Pneumonia      Past Surgical History:  Procedure Laterality Date   BACK SURGERY  2017   lumbar region herniated disc   VIDEO BRONCHOSCOPY WITH ENDOBRONCHIAL NAVIGATION N/A  08/07/2020   Procedure: VIDEO BRONCHOSCOPY WITH ENDOBRONCHIAL NAVIGATION;  Surgeon: Tyler Pita, MD;  Location: [facility] ORS;  Service: Pulmonary;  Laterality: N/A;   VIDEO BRONCHOSCOPY WITH ENDOBRONCHIAL ULTRASOUND N/A 08/07/2020   Procedure: VIDEO BRONCHOSCOPY WITH ENDOBRONCHIAL ULTRASOUND;  Surgeon: Tyler Pita, MD;  Location: [facility] ORS;  Service: Pulmonary;  Laterality: N/A;    Social History   Socioeconomic History   Marital status: Divorced    Spouse name: Not on file   Number of children: Not on file   Years of education: Not on file   Highest education level: Not on file  Occupational History   Not on file  Tobacco Use   Smoking status: Every Day    Packs/day: 2.00    Years: 35.00    Pack years: 70.00    Types: Cigarettes   Smokeless tobacco: Former    Types: Chew   Tobacco comments:    2PPD 01/19/2021  Vaping Use   Vaping Use: Never used  Substance and Sexual Activity   Alcohol use: Yes    Alcohol/week: 4.0 standard drinks    Types: 4 Cans of beer per week   Drug use: No   Sexual activity: Yes  Other Topics Concern   Not on file  Social History Narrative   Not on file   Social Determinants of Health   Financial Resource Strain: Not on file  Food Insecurity: Not on file  Transportation Needs: Not on file  Physical Activity: Not on file  Stress: Not on file  Social Connections: Not on file  Intimate Partner Violence: Not on file    Family History  Problem Relation Age of Onset   Diabetes Father    Lung cancer Father 44   Heart attack Father    Throat cancer Maternal Aunt      Current Outpatient Medications:    acetaminophen (TYLENOL) 500 MG tablet, Take 500-1,000 mg by mouth every 6 (six) hours as needed (for pain.)., Disp: , Rfl:    albuterol (VENTOLIN HFA) 108 (90 Base) MCG/ACT inhaler, Inhale 2 puffs into the lungs every 6 (six) hours as needed for wheezing or shortness of breath., Disp: 8 g, Rfl: 2   budesonide-formoterol (SYMBICORT)  160-4.5 MCG/ACT inhaler, Inhale 2 puffs into the lungs in the morning and at bedtime., Disp: 1 each, Rfl: 12   levothyroxine (SYNTHROID) 125 MCG tablet, Take 1 tablet (125 mcg total) by mouth daily before breakfast., Disp: 30 tablet, Rfl: 0   morphine (MS CONTIN) 15 MG 12 hr tablet, Take 1 tablet (15 mg total) by mouth every 12 (twelve) hours. (Patient not taking: Reported on 10/19/2021), Disp: 60 tablet, Rfl: 0   oxyCODONE (OXY IR/ROXICODONE) 5 MG immediate release tablet, Take 1 tablet (5 mg total) by mouth every 4 (four) hours as needed for severe pain., Disp: 60 tablet, Rfl: 0  Physical exam:  Vitals:   11/16/21 0840  BP: 98/64  Pulse: 71  Resp: 18  Temp: (!) 96.2 F (35.7 C)  SpO2: 100%  Weight: 188 lb 9.6 oz (85.5 kg)   Physical Exam HENT:     Head: Normocephalic and atraumatic.  Eyes:     Pupils: Pupils are equal, round, and reactive to light.  Cardiovascular:     Rate and  Rhythm: Normal rate and regular rhythm.     Heart sounds: Normal heart sounds.  Pulmonary:     Effort: Pulmonary effort is normal.     Breath sounds: Normal breath sounds.  Abdominal:     General: Bowel sounds are normal.     Palpations: Abdomen is soft.  Musculoskeletal:     Cervical back: Normal range of motion.  Skin:    General: Skin is warm and dry.  Neurological:     Mental Status: He is alert and oriented to person, place, and time.     CMP Latest Ref Rng & Units 11/16/2021  Glucose 70 - 99 mg/dL 109(H)  BUN 6 - 20 mg/dL 10  Creatinine 0.61 - 1.24 mg/dL 0.82  Sodium 135 - 145 mmol/L 133(L)  Potassium 3.5 - 5.1 mmol/L 4.0  Chloride 98 - 111 mmol/L 99  CO2 22 - 32 mmol/L 24  Calcium 8.9 - 10.3 mg/dL 8.9  Total Protein 6.5 - 8.1 g/dL 7.4  Total Bilirubin 0.3 - 1.2 mg/dL 0.2(L)  Alkaline Phos 38 - 126 U/L 51  AST 15 - 41 U/L 17  ALT 0 - 44 U/L 11   CBC Latest Ref Rng & Units 11/16/2021  WBC 4.0 - 10.5 K/uL 8.4  Hemoglobin 13.0 - 17.0 g/dL 13.3  Hematocrit 39.0 - 52.0 % 39.7   Platelets 150 - 400 K/uL 285    No images are attached to the encounter.  NM Bone Scan Whole Body  Result Date: 11/12/2021 CLINICAL DATA:  Lung cancer restaging. EXAM: NUCLEAR MEDICINE WHOLE BODY BONE SCAN TECHNIQUE: Whole body anterior and posterior images were obtained approximately 3 hours after intravenous injection of radiopharmaceutical. RADIOPHARMACEUTICALS:  20.24 mCi Technetium-29mMDP IV COMPARISON:  Chest, abdomen and pelvis CT earlier today. FINDINGS: There is a short linear uptake abnormality in the posteromedial right eleventh rib versus superimposed activity in the upper pole collecting system of the right kidney. Mild degenerative type activity is noted in the shoulders, right ankle. Elsewhere there is physiologic skeletal radiotracer uptake in the axial and visualized appendicular skeleton. Renal and bladder activity are unremarkable. IMPRESSION: Questionable small uptake abnormality in the posterior right eleventh rib versus superimposed activity in the right renal upper pole collecting system. The CT today did not show a focal lesion in the bone. Follow-up as indicated. Electronically Signed   By: KTelford NabM.D.   On: 11/12/2021 01:42   CT CHEST ABDOMEN PELVIS W CONTRAST  Result Date: 11/11/2021 CLINICAL DATA:  Left upper lobe lung cancer diagnosed in July 2021. Status post chemotherapy. Restaging. EXAM: CT CHEST, ABDOMEN, AND PELVIS WITH CONTRAST TECHNIQUE: Multidetector CT imaging of the chest, abdomen and pelvis was performed following the standard protocol during bolus administration of intravenous contrast. CONTRAST:  1046mOMNIPAQUE IOHEXOL 300 MG/ML  SOLN COMPARISON:  CT of the chest abdomen pelvis dated 08/03/2021. FINDINGS: CT CHEST FINDINGS Cardiovascular: There is no cardiomegaly. Trace pericardial effusion. There is coronary vascular calcification of the LAD. The thoracic aorta is unremarkable. No aneurysmal dilatation or dissection. The origins of the great vessels  of the aortic arch appear patent as visualized. The central pulmonary arteries are patent. Mediastinum/Nodes: Left hilar soft tissue extension from left upper lobe mass. No mediastinal adenopathy. The esophagus is grossly unremarkable. No mediastinal fluid collection. Lungs/Pleura: Background of emphysema. Overall interval decrease in the left upper lobe mass now measuring approximately 4.3 x 2.0 cm (previously 5.0 x 2.7 cm). There is associated thickening of the adjacent pleura. Significant interval decrease  in the previously seen cavitary lesion in the right upper lobe now measuring approximately 10 x 7 mm (previously 11 x 14 mm). A 7 mm subpleural nodule at the left lung base (132/3) appears similar to prior CT. No new consolidation. There is no pleural effusion pneumothorax. The central airways are patent. Musculoskeletal: No acute osseous pathology. No suspicious bone lesions. CT ABDOMEN PELVIS FINDINGS No intra-abdominal free air or free fluid. Hepatobiliary: Several enhancing lesions within the liver measure up to 1.8 cm in the right lobe which are not characterized on this CT but appears similar to prior CT and possibly hemangioma. No new or suspicious lesions. No intrahepatic biliary dilatation. The gallbladder is unremarkable. Pancreas: Unremarkable. No pancreatic ductal dilatation or surrounding inflammatory changes. Spleen: Normal in size without focal abnormality. Adrenals/Urinary Tract: The adrenal glands unremarkable. The kidneys, visualized ureters, and urinary bladder appear unremarkable. Stomach/Bowel: There is no bowel obstruction or active inflammation. The appendix is normal. Vascular/Lymphatic: The abdominal aorta and IVC unremarkable. No portal venous gas. There is no adenopathy. Reproductive: The prostate and seminal vesicles are grossly unremarkable. No pelvic mass. Other: None Musculoskeletal: No acute osseous pathology. No suspicious bone lesions. IMPRESSION: 1. Interval decrease in the  size of the left upper lobe mass and right upper lobe cavitary lesion. 2. No evidence of metastatic disease in the abdomen or pelvis. 3. Aortic Atherosclerosis (ICD10-I70.0) and Emphysema (ICD10-J43.9). Electronically Signed   By: Anner Crete M.D.   On: 11/11/2021 22:21     Assessment and plan- Patient is a 55 y.o. male with h/o stage III squamous cell carcinoma of the left upper lobe T3 N1 M0.  He is here for on treatment assessment prior to cycle 16 of maintenance durvalumab  I have reviewed CT chest abdomen pelvis images as well as bone scan images independently and discussed findings with patient and his daughter.  OverallPatient continues to have ongoing response to treatment with no evidence of progressive disease.  Plan is to continue durvalumab for 3 more cycles after today.  That we will complete total 1 year of adjuvant treatment.  He did have an interim break for about 3 months when he came off treatment  Counts otherwise okay to proceed with durvalumab today and he will be seen by covering NP in 4 weeks and I will see him back in 8 weeks.  Autoimmune hypothyroidism: We will renew his prescription today he is currently on 125 mcg of levothyroxine.  Repeat TSH at next visit and if it continues to be elevated will increase the dose of levothyroxine to 150 mcg   Visit Diagnosis 1. Encounter for antineoplastic immunotherapy   2. Autoimmune hypothyroidism   3. Malignant neoplasm of unspecified part of unspecified bronchus or lung ([place])      Dr. Randa Evens, MD, MPH [facility] [phone] 11/16/2021 8:38 AM

## 2021-11-16 NOTE — Patient Instructions (Signed)
[facility] ONCOLOGY  Discharge Instructions: Thank you for choosing [facility] to provide your oncology and hematology care.  If you have a lab appointment with the [facility], please go directly to the [facility] and check in at the registration area.  Wear comfortable clothing and clothing appropriate for easy access to any Portacath or PICC line.   We strive to give you quality time with your provider. You may need to reschedule your appointment if you arrive late (15 or more minutes).  Arriving late affects you and other patients whose appointments are after yours.  Also, if you miss three or more appointments without notifying the office, you may be dismissed from the clinic at the provider's discretion.      For prescription refill requests, have your pharmacy contact our office and allow 72 hours for refills to be completed.    Today you received the following chemotherapy and/or immunotherapy agents IMFINZI      To help prevent nausea and vomiting after your treatment, we encourage you to take your nausea medication as directed.  BELOW ARE SYMPTOMS THAT SHOULD BE REPORTED IMMEDIATELY: *FEVER GREATER THAN 100.4 F (38 C) OR HIGHER *CHILLS OR SWEATING *NAUSEA AND VOMITING THAT IS NOT CONTROLLED WITH YOUR NAUSEA MEDICATION *UNUSUAL SHORTNESS OF BREATH *UNUSUAL BRUISING OR BLEEDING *URINARY PROBLEMS (pain or burning when urinating, or frequent urination) *BOWEL PROBLEMS (unusual diarrhea, constipation, pain near the anus) TENDERNESS IN MOUTH AND THROAT WITH OR WITHOUT PRESENCE OF ULCERS (sore throat, sores in mouth, or a toothache) UNUSUAL RASH, SWELLING OR PAIN  UNUSUAL VAGINAL DISCHARGE OR ITCHING   Items with * indicate a potential emergency and should be followed up as soon as possible or go to the Emergency Department if any problems should occur.  Please show the CHEMOTHERAPY ALERT CARD or IMMUNOTHERAPY ALERT CARD at check-in to  the Emergency Department and triage nurse.  Should you have questions after your visit or need to cancel or reschedule your appointment, please contact [facility]  [phone] and follow the prompts.  Office hours are 8:00 a.m. to 4:30 p.m. Monday - Friday. Please note that voicemails left after 4:00 p.m. may not be returned until the following business day.  We are closed weekends and major holidays. You have access to a nurse at all times for urgent questions. Please call the main number to the clinic [phone] and follow the prompts.  For any non-urgent questions, you may also contact your provider using MyChart. We now offer e-Visits for anyone 47 and older to request care online for non-urgent symptoms. For details visit [email or web address].   Also download the MyChart app! Go to the app store, search "MyChart", open the app, select [facility], and log in with your MyChart username and password.  Due to Covid, a mask is required upon entering the hospital/clinic. If you do not have a mask, one will be given to you upon arrival. For doctor visits, patients may have 1 support person aged 65 or older with them. For treatment visits, patients cannot have anyone with them due to current Covid guidelines and our immunocompromised population.   Durvalumab injection What is this medication? DURVALUMAB (dur VAL ue mab) is a monoclonal antibody. It is used to treat lung cancer. This medicine may be used for other purposes; ask your health care provider or pharmacist if you have questions. COMMON BRAND NAME(S): IMFINZI What should I tell my care team before I take  this medication? They need to know if you have any of these conditions: autoimmune diseases like Crohn's disease, ulcerative colitis, or lupus have had or planning to have an allogeneic stem cell transplant (uses someone else's stem cells) history of organ transplant history of radiation to the  chest nervous system problems like myasthenia gravis or Guillain-Barre syndrome an unusual or allergic reaction to durvalumab, other medicines, foods, dyes, or preservatives pregnant or trying to get pregnant breast-feeding How should I use this medication? This medicine is for infusion into a vein. It is given by a health care professional in a hospital or clinic setting. A special MedGuide will be given to you before each treatment. Be sure to read this information carefully each time. Talk to your pediatrician regarding the use of this medicine in children. Special care may be needed. Overdosage: If you think you have taken too much of this medicine contact a poison control center or emergency room at once. NOTE: This medicine is only for you. Do not share this medicine with others. What if I miss a dose? It is important not to miss your dose. Call your doctor or health care professional if you are unable to keep an appointment. What may interact with this medication? Interactions have not been studied. This list may not describe all possible interactions. Give your health care provider a list of all the medicines, herbs, non-prescription drugs, or dietary supplements you use. Also tell them if you smoke, drink alcohol, or use illegal drugs. Some items may interact with your medicine. What should I watch for while using this medication? This medication may make you feel generally unwell. Continue your course of treatment even though you feel ill unless your care team tells you to stop. You may need blood work done while you are taking this medication. Do not become pregnant while taking this medication or for 3 months after stopping it. Women should inform their care team if they wish to become pregnant or think they might be pregnant. There is a potential for serious side effects to an unborn child. Talk to your care team or pharmacist for more information. Do not breast-feed an infant while  taking this medication or for 3 months after stopping it. What side effects may I notice from receiving this medication? Side effects that you should report to your care team as soon as possible: Allergic reactions--skin rash, itching, hives, swelling of the face, lips, tongue, or throat Bloody or watery diarrhea Dizziness, loss of balance or coordination, confusion or trouble speaking Dry cough, shortness of breath or trouble breathing Flushing, mostly over the face, neck, and chest, during injection High blood sugar (hyperglycemia)--increased thirst or amount of urine, unusual weakness or fatigue, blurry vision High thyroid levels (hyperthyroidism)--fast or irregular heartbeat, weight loss, excessive sweating or sensitivity to heat, tremors or shaking, anxiety, nervousness, irregular menstrual cycle or spotting Infection--fever, chills, cough, or sore throat Liver injury--right upper belly pain, loss of appetite, nausea, light-colored stool, dark yellow or brown urine, yellowing skin or eyes, unusual weakness or fatigue Low adrenal gland function--nausea, vomiting, loss of appetite, unusual weakness or fatigue, dizziness, low blood pressure Low thyroid levels (hypothyroidism)--unusual weakness or fatigue, increased sensitivity to cold, constipation, hair loss, dry skin, weight gain, feelings of depression Pancreatitis--severe stomach pain that spreads to your back or gets worse after eating or when touched, fever, nausea, vomiting Rash, fever, and swollen lymph nodes Redness, blistering, peeling or loosening of the skin, including inside the mouth Wheezing--trouble breathing with  loud or whistling sounds Side effects that usually do not require medical attention (report these to your care team if they continue or are bothersome): Fatigue Hair loss This list may not describe all possible side effects. Call your doctor for medical advice about side effects. You may report side effects to FDA at  1-800-FDA-1088. Where should I keep my medication? This medication is given in a hospital or clinic. It will not be stored at home. NOTE: This sheet is a summary. It may not cover all possible information. If you have questions about this medicine, talk to your doctor, pharmacist, or health care provider.  2022 Elsevier/Gold Standard (2021-08-31 00:00:00)

## 2021-11-17 ENCOUNTER — Ambulatory Visit: Admission: RE | Admit: 2021-11-17 | Payer: Medicaid Other | Source: Ambulatory Visit

## 2021-11-20 ENCOUNTER — Other Ambulatory Visit: Payer: Self-pay | Admitting: Oncology

## 2021-11-22 ENCOUNTER — Other Ambulatory Visit: Payer: Self-pay | Admitting: Oncology

## 2021-11-23 ENCOUNTER — Encounter: Payer: Self-pay | Admitting: Oncology

## 2021-11-23 MED ORDER — OXYCODONE HCL 5 MG PO TABS: 5.0000 mg | ORAL_TABLET | ORAL | 0 refills | Status: DC | PRN

## 2021-11-24 ENCOUNTER — Other Ambulatory Visit: Payer: Self-pay

## 2021-11-24 ENCOUNTER — Encounter: Payer: Self-pay | Admitting: Radiation Oncology

## 2021-11-24 ENCOUNTER — Ambulatory Visit
Admission: RE | Admit: 2021-11-24 | Discharge: 2021-11-24 | Disposition: A | Discharge status: Home / Self Care | Payer: Medicaid Other | Source: Ambulatory Visit | Attending: Radiation Oncology | Admitting: Radiation Oncology

## 2021-11-24 VITALS — BP 108/64 | HR 78 | Temp 97.3°F | Wt 187.2 lb

## 2021-11-24 DIAGNOSIS — Z08 Encounter for follow-up examination after completed treatment for malignant neoplasm: Secondary | ICD-10-CM | POA: Diagnosis not present

## 2021-11-24 DIAGNOSIS — Z923 Personal history of irradiation: Secondary | ICD-10-CM | POA: Insufficient documentation

## 2021-11-24 DIAGNOSIS — C3412 Malignant neoplasm of upper lobe, left bronchus or lung: Secondary | ICD-10-CM | POA: Diagnosis not present

## 2021-11-24 DIAGNOSIS — C3490 Malignant neoplasm of unspecified part of unspecified bronchus or lung: Secondary | ICD-10-CM

## 2021-11-24 DIAGNOSIS — Z87891 Personal history of nicotine dependence: Secondary | ICD-10-CM | POA: Diagnosis not present

## 2021-11-24 DIAGNOSIS — C3411 Malignant neoplasm of upper lobe, right bronchus or lung: Secondary | ICD-10-CM | POA: Diagnosis not present

## 2021-11-24 NOTE — Progress Notes (Signed)
Radiation Oncology Follow up Note  Name: Wesley Ferguson   Date:   11/24/2021 MRN:  [id number] DOB: Feb 29, 1966    This 55 y.o. 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  REFERRING PROVIDER: No ref. provider found  HPI: Patient is a 55 year old male now at 5 months having completed 6 salvage SBRT to his left lung and patient previously treated for stage IIIa squamous cell carcinoma of the left lung with concurrent chemoradiation seen today in routine follow-up he is doing well.  Specifically [place] cough hemoptysis chest tightness.[name]  He is currently on cycle 16 of maintenance durvalumab.  He recently bone scan which I have reviewed shows a small questionable uptake in the posterior right 11th rib with no CT correlate.  Not that impressed with that finding.  CT scan of his chest shows interval decrease in the size of the left upper lobe mass and right upper lobe cavitary lesion no evidence of progressive disease or metastatic disease in abdomen or pelvis.  COMPLICATIONS OF TREATMENT: none  FOLLOW UP COMPLIANCE: keeps appointments   PHYSICAL EXAM:  BP 108/64   Pulse 78   Temp (!) 97.3 F (36.3 C) (Tympanic)   Wt 187 lb 3.2 oz (84.9 kg)   BMI 24.04 kg/m  Well-developed well-nourished patient in NAD. HEENT reveals PERLA, EOMI, discs not visualized.  Oral cavity is clear. No oral mucosal lesions are identified. Neck is clear without evidence of cervical or supraclavicular adenopathy. Lungs are clear to A&P. Cardiac examination is essentially unremarkable with regular rate and rhythm without murmur rub or thrill. Abdomen is benign with no organomegaly or masses noted. Motor sensory and DTR levels are equal and symmetric in the upper and lower extremities. Cranial nerves II through XII are grossly intact. Proprioception is intact. No peripheral adenopathy  or edema is identified. No motor or sensory levels are noted. Crude visual fields are within normal range.  RADIOLOGY RESULTS: Bone scan and CT scan reviewed compatible with above-stated findings  PLAN: Present time patient is doing well with stable chest by CT criteria.  I am pleased with his overall progress.  Of asked to see him back in 6 months for follow-up.Patient is to call with any concerns.      Noreene Filbert, MD

## 2021-12-08 ENCOUNTER — Other Ambulatory Visit: Payer: Self-pay | Admitting: *Deleted

## 2021-12-08 DIAGNOSIS — C3490 Malignant neoplasm of unspecified part of unspecified bronchus or lung: Secondary | ICD-10-CM

## 2021-12-14 ENCOUNTER — Telehealth: Payer: Self-pay | Admitting: Nurse Practitioner

## 2021-12-14 ENCOUNTER — Inpatient Hospital Stay: Payer: Medicaid Other

## 2021-12-14 ENCOUNTER — Inpatient Hospital Stay: Payer: Medicaid Other | Admitting: Nurse Practitioner

## 2021-12-14 ENCOUNTER — Telehealth: Payer: Self-pay | Admitting: Oncology

## 2021-12-14 NOTE — Telephone Encounter (Signed)
FYI this is a Wesley Ferguson pt I think we have rescheduled this already

## 2021-12-14 NOTE — Telephone Encounter (Signed)
Caregiver called to reschedule pt's appt for today. Call back at [phone]

## 2021-12-14 NOTE — Telephone Encounter (Signed)
Attempt made to reach patient about missed appointment today. No answer and unable to leave a VM. Sending letter in the mail.

## 2021-12-17 ENCOUNTER — Other Ambulatory Visit: Payer: Self-pay | Admitting: Oncology

## 2021-12-20 ENCOUNTER — Encounter: Payer: Self-pay | Admitting: Oncology

## 2021-12-22 ENCOUNTER — Other Ambulatory Visit: Payer: Self-pay | Admitting: Oncology

## 2021-12-23 ENCOUNTER — Other Ambulatory Visit: Payer: Self-pay | Admitting: *Deleted

## 2021-12-23 DIAGNOSIS — R519 Headache, unspecified: Secondary | ICD-10-CM

## 2021-12-23 DIAGNOSIS — C3490 Malignant neoplasm of unspecified part of unspecified bronchus or lung: Secondary | ICD-10-CM

## 2021-12-28 ENCOUNTER — Inpatient Hospital Stay (HOSPITAL_BASED_OUTPATIENT_CLINIC_OR_DEPARTMENT_OTHER): Payer: Medicaid Other | Admitting: Nurse Practitioner

## 2021-12-28 ENCOUNTER — Inpatient Hospital Stay: Payer: Medicaid Other

## 2021-12-28 ENCOUNTER — Other Ambulatory Visit: Payer: Self-pay

## 2021-12-28 ENCOUNTER — Inpatient Hospital Stay: Payer: Medicaid Other | Attending: Nurse Practitioner

## 2021-12-28 ENCOUNTER — Encounter: Payer: Self-pay | Admitting: Oncology

## 2021-12-28 VITALS — BP 107/66 | HR 81 | Temp 98.2°F | Resp 18 | Wt 183.0 lb

## 2021-12-28 DIAGNOSIS — E871 Hypo-osmolality and hyponatremia: Secondary | ICD-10-CM | POA: Insufficient documentation

## 2021-12-28 DIAGNOSIS — M7989 Other specified soft tissue disorders: Secondary | ICD-10-CM | POA: Diagnosis not present

## 2021-12-28 DIAGNOSIS — C3412 Malignant neoplasm of upper lobe, left bronchus or lung: Secondary | ICD-10-CM | POA: Insufficient documentation

## 2021-12-28 DIAGNOSIS — F17210 Nicotine dependence, cigarettes, uncomplicated: Secondary | ICD-10-CM | POA: Insufficient documentation

## 2021-12-28 DIAGNOSIS — G893 Neoplasm related pain (acute) (chronic): Secondary | ICD-10-CM

## 2021-12-28 DIAGNOSIS — Z801 Family history of malignant neoplasm of trachea, bronchus and lung: Secondary | ICD-10-CM | POA: Diagnosis not present

## 2021-12-28 DIAGNOSIS — R634 Abnormal weight loss: Secondary | ICD-10-CM | POA: Diagnosis not present

## 2021-12-28 DIAGNOSIS — Z8249 Family history of ischemic heart disease and other diseases of the circulatory system: Secondary | ICD-10-CM | POA: Insufficient documentation

## 2021-12-28 DIAGNOSIS — Z28310 Unvaccinated for covid-19: Secondary | ICD-10-CM | POA: Diagnosis not present

## 2021-12-28 DIAGNOSIS — Z79890 Hormone replacement therapy (postmenopausal): Secondary | ICD-10-CM | POA: Insufficient documentation

## 2021-12-28 DIAGNOSIS — I6782 Cerebral ischemia: Secondary | ICD-10-CM | POA: Diagnosis not present

## 2021-12-28 DIAGNOSIS — R0789 Other chest pain: Secondary | ICD-10-CM | POA: Diagnosis not present

## 2021-12-28 DIAGNOSIS — Z5112 Encounter for antineoplastic immunotherapy: Secondary | ICD-10-CM | POA: Insufficient documentation

## 2021-12-28 DIAGNOSIS — Z7289 Other problems related to lifestyle: Secondary | ICD-10-CM | POA: Diagnosis not present

## 2021-12-28 DIAGNOSIS — Z79899 Other long term (current) drug therapy: Secondary | ICD-10-CM | POA: Diagnosis not present

## 2021-12-28 DIAGNOSIS — Z808 Family history of malignant neoplasm of other organs or systems: Secondary | ICD-10-CM | POA: Insufficient documentation

## 2021-12-28 DIAGNOSIS — C3490 Malignant neoplasm of unspecified part of unspecified bronchus or lung: Secondary | ICD-10-CM

## 2021-12-28 DIAGNOSIS — J439 Emphysema, unspecified: Secondary | ICD-10-CM | POA: Insufficient documentation

## 2021-12-28 DIAGNOSIS — Z833 Family history of diabetes mellitus: Secondary | ICD-10-CM | POA: Insufficient documentation

## 2021-12-28 DIAGNOSIS — R590 Localized enlarged lymph nodes: Secondary | ICD-10-CM | POA: Insufficient documentation

## 2021-12-28 DIAGNOSIS — E063 Autoimmune thyroiditis: Secondary | ICD-10-CM | POA: Diagnosis not present

## 2021-12-28 DIAGNOSIS — D1803 Hemangioma of intra-abdominal structures: Secondary | ICD-10-CM | POA: Diagnosis not present

## 2021-12-28 DIAGNOSIS — L02413 Cutaneous abscess of right upper limb: Secondary | ICD-10-CM | POA: Insufficient documentation

## 2021-12-28 DIAGNOSIS — M79601 Pain in right arm: Secondary | ICD-10-CM | POA: Diagnosis not present

## 2021-12-28 LAB — COMPREHENSIVE METABOLIC PANEL
ALT: 12 U/L (ref 0–44)
AST: 17 U/L (ref 15–41)
Albumin: 3.9 g/dL (ref 3.5–5.0)
Alkaline Phosphatase: 56 U/L (ref 38–126)
Anion gap: 9 (ref 5–15)
BUN: 7 mg/dL (ref 6–20)
CO2: 25 mmol/L (ref 22–32)
Calcium: 8.8 mg/dL — ABNORMAL LOW (ref 8.9–10.3)
Chloride: 96 mmol/L — ABNORMAL LOW (ref 98–111)
Creatinine, Ser: 0.83 mg/dL (ref 0.61–1.24)
GFR, Estimated: >60 mL/min (ref >60)
Glucose, Bld: 146 mg/dL — ABNORMAL HIGH (ref 70–99)
Potassium: 4 mmol/L (ref 3.5–5.1)
Sodium: 130 mmol/L — ABNORMAL LOW (ref 135–145)
Total Bilirubin: 0.1 mg/dL — ABNORMAL LOW (ref 0.3–1.2)
Total Protein: 7 g/dL (ref 6.5–8.1)

## 2021-12-28 LAB — CBC WITH DIFFERENTIAL/PLATELET
Abs Immature Granulocytes: 0.03 10*3/uL (ref 0.00–0.07)
Basophils Absolute: 0.1 10*3/uL (ref 0.0–0.1)
Basophils Relative: 1 %
Eosinophils Absolute: 0.4 10*3/uL (ref 0.0–0.5)
Eosinophils Relative: 4 %
HCT: 40.2 % (ref 39.0–52.0)
Hemoglobin: 13.4 g/dL (ref 13.0–17.0)
Immature Granulocytes: 0 %
Lymphocytes Relative: 15 %
Lymphs Abs: 1.4 10*3/uL (ref 0.7–4.0)
MCH: 29.8 pg (ref 26.0–34.0)
MCHC: 33.3 g/dL (ref 30.0–36.0)
MCV: 89.5 fL (ref 80.0–100.0)
Monocytes Absolute: 0.8 10*3/uL (ref 0.1–1.0)
Monocytes Relative: 8 %
Neutro Abs: 6.9 10*3/uL (ref 1.7–7.7)
Neutrophils Relative %: 72 %
Platelets: 297 10*3/uL (ref 150–400)
RBC: 4.49 MIL/uL (ref 4.22–5.81)
RDW: 13 % (ref 11.5–15.5)
WBC: 9.7 10*3/uL (ref 4.0–10.5)
nRBC: 0 % (ref 0.0–0.2)

## 2021-12-28 LAB — TSH: TSH: 29.028 u[IU]/mL — ABNORMAL HIGH (ref 0.350–4.500)

## 2021-12-28 MED ORDER — HEPARIN SOD (PORK) LOCK FLUSH 100 UNIT/ML IV SOLN
500.0000 [IU] | Freq: Once | INTRAVENOUS | Status: DC | PRN
  Filled 2021-12-28: qty 5

## 2021-12-28 MED ORDER — MORPHINE SULFATE ER 15 MG PO TBCR: 15.0000 mg | EXTENDED_RELEASE_TABLET | Freq: Two times a day (BID) | ORAL | 0 refills | Status: DC

## 2021-12-28 MED ORDER — LEVOTHYROXINE SODIUM 137 MCG PO TABS: 137.0000 ug | ORAL_TABLET | Freq: Every day | ORAL | 0 refills | Status: DC

## 2021-12-28 MED ORDER — SODIUM CHLORIDE 0.9 % IV SOLN
Freq: Once | INTRAVENOUS | Status: AC
  Filled 2021-12-28: qty 250

## 2021-12-28 MED ORDER — OXYCODONE HCL 5 MG PO TABS: 5.0000 mg | ORAL_TABLET | ORAL | 0 refills | Status: DC | PRN

## 2021-12-28 MED ORDER — SODIUM CHLORIDE 0.9% FLUSH
10.0000 mL | INTRAVENOUS | Status: DC | PRN
  Filled 2021-12-28: qty 10

## 2021-12-28 MED ORDER — SODIUM CHLORIDE 0.9 % IV SOLN
1500.0000 mg | Freq: Once | INTRAVENOUS | Status: AC
  Administered 2021-12-28: 1500 mg via INTRAVENOUS
  Filled 2021-12-28: qty 30

## 2021-12-28 NOTE — Progress Notes (Signed)
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  He had a chest x-ray followed by a CT scan which showed a 5.7 x 4.4 cm left upper lobe mass along with left hilar adenopathy.  There was a low-density 3.6 lesion noted in the right hepatic lobe which ultimately turned out to be a hemangioma on MRI liver.  PET CT scan showed hypermetabolic activity in the left upper lobe and left hilar nodal tissue.  Subcarinal and right paratracheal lymph nodes with mild hypermetabolic features.   Patient underwent bronchoscopies and biopsies.Left upper lobe biopsy was consistent with non-small cell carcinoma favor squamous cell carcinoma right subcarinal lymph node biopsy was negative for malignancy.   Plan is for concurrent chemoradiation with carbotaxol followed by maintenance durvalumab.  Patient does not want to get a port placed.  He is also refused Covid vaccination   NGS testing showed high tumor mutational burden.  PD-L1 90%.  MSI stable.  Patient PIK3CA and notch2 FCHO2 fusion, T p53   Scans in May 2022 Showed 2 hypermetabolic nodules in the left upper lobe and new hypermetabolic cavitary nodule in the right upper lobe concerning for disease progression.  No evidence of distant metastatic disease.  Patient was seen by  Dr. Donella Stade and received SBRT to the left lung with plans for continued monitoring of the right upper lobe lung nodule.  Patient received last Durvalumab on 05/07/2021.  Durvalumab was on hold during radiation treatment until 06/29/2021 but patient did not follow-up after that to resume treatment     Interval history-he continues to do well overall.  Chest wall pain is persistent and he has run out of his pain medicine request refill today.  Denies opioid-induced constipation.  Reports compliance with levothyroxine but also believes he needs a refill.  Has lost some weight but reports good appetite.  He denies other specific complaints.  ECOG PS- 1 Pain scale- 4 Opioid associated constipation- no  Review of systems- Review of Systems  Constitutional:  Negative for chills, fever, malaise/fatigue and weight loss.  HENT:  Negative for congestion, ear discharge and nosebleeds.   Eyes:  Negative for blurred vision.  Respiratory:  Negative for cough, hemoptysis, sputum production, shortness of breath and wheezing.   Cardiovascular:  Negative for chest pain, palpitations, orthopnea and claudication.  Gastrointestinal:  Negative for abdominal pain, blood in stool, constipation, diarrhea, heartburn, melena, nausea and vomiting.  Genitourinary:  Negative for dysuria, flank pain, frequency, hematuria and urgency.  Musculoskeletal:  Negative for back pain, joint pain and myalgias.  Skin:  Negative for rash.  Neurological:  Negative for dizziness, tingling, focal weakness, seizures, weakness and headaches.  Endo/Heme/Allergies:  Does not bruise/bleed easily.  Psychiatric/Behavioral:  Negative for depression and suicidal ideas. The patient does not have insomnia.      No Known Allergies   Past Medical  History:  Diagnosis Date   COPD (chronic obstructive pulmonary disease) (HCC)    Emphysema of lung ([place])    Lung cancer ([place]) 06/2020   Pneumonia     Past Surgical History:  Procedure Laterality Date    BACK SURGERY  2017   lumbar region herniated disc   VIDEO BRONCHOSCOPY WITH ENDOBRONCHIAL NAVIGATION N/A 08/07/2020   Procedure: VIDEO BRONCHOSCOPY WITH ENDOBRONCHIAL NAVIGATION;  Surgeon: Tyler Pita, MD;  Location: [facility] ORS;  Service: Pulmonary;  Laterality: N/A;   VIDEO BRONCHOSCOPY WITH ENDOBRONCHIAL ULTRASOUND N/A 08/07/2020   Procedure: VIDEO BRONCHOSCOPY WITH ENDOBRONCHIAL ULTRASOUND;  Surgeon: Tyler Pita, MD;  Location: [facility] ORS;  Service: Pulmonary;  Laterality: N/A;    Social History   Socioeconomic History   Marital status: Divorced    Spouse name: Not on file   Number of children: Not on file   Years of education: Not on file   Highest education level: Not on file  Occupational History   Not on file  Tobacco Use   Smoking status: Every Day    Packs/day: 2.00    Years: 35.00    Pack years: 70.00    Types: Cigarettes   Smokeless tobacco: Former    Types: Chew   Tobacco comments:    2PPD 01/19/2021  Vaping Use   Vaping Use: Never used  Substance and Sexual Activity   Alcohol use: Yes    Alcohol/week: 4.0 standard drinks    Types: 4 Cans of beer per week   Drug use: No   Sexual activity: Yes  Other Topics Concern   Not on file  Social History Narrative   Not on file   Social Determinants of Health   Financial Resource Strain: Not on file  Food Insecurity: Not on file  Transportation Needs: Not on file  Physical Activity: Not on file  Stress: Not on file  Social Connections: Not on file  Intimate Partner Violence: Not on file    Family History  Problem Relation Age of Onset   Diabetes Father    Lung cancer Father 54   Heart attack Father    Throat cancer Maternal Aunt     Current Outpatient Medications:    acetaminophen (TYLENOL) 500 MG tablet, Take 500-1,000 mg by mouth every 6 (six) hours as needed (for pain.)., Disp: , Rfl:    budesonide-formoterol (SYMBICORT) 160-4.5 MCG/ACT inhaler, Inhale 2 puffs into the lungs in the morning  and at bedtime., Disp: 1 each, Rfl: 12   levothyroxine (SYNTHROID) 125 MCG tablet, TAKE 1 TABLET BY MOUTH DAILY BEFORE BREAKFAST., Disp: 30 tablet, Rfl: 0   morphine (MS CONTIN) 15 MG 12 hr tablet, Take 1 tablet (15 mg total) by mouth every 12 (twelve) hours., Disp: 60 tablet, Rfl: 0   oxyCODONE (OXY IR/ROXICODONE) 5 MG immediate release tablet, Take 1 tablet (5 mg total) by mouth every 4 (four) hours as needed for severe pain., Disp: 60 tablet, Rfl: 0   VENTOLIN HFA 108 (90 Base) MCG/ACT inhaler, TAKE 2 PUFFS BY MOUTH EVERY 6 HOURS AS NEEDED FOR WHEEZE OR SHORTNESS OF BREATH, Disp: 18 each, Rfl: 2  Physical exam:  Vitals:   12/28/21 0909  BP: 107/66  Pulse: 81  Resp: 18  Temp: 98.2 F (36.8 C)  TempSrc: Tympanic  SpO2: 100%  Weight: 183 lb (83 kg)   Physical Exam Constitutional:      Appearance: He is not ill-appearing.  Cardiovascular:     Rate and Rhythm: Normal rate and regular  rhythm.  Pulmonary:     Effort: Pulmonary effort is normal.     Breath sounds: Normal breath sounds.  Abdominal:     Palpations: Abdomen is soft.  Musculoskeletal:     Comments: Ambulating w/o aids  Skin:    General: Skin is warm and dry.  Neurological:     Mental Status: He is alert and oriented to person, place, and time.  Psychiatric:        Mood and Affect: Mood normal.        Behavior: Behavior normal.     CMP Latest Ref Rng & Units 12/28/2021  Glucose 70 - 99 mg/dL 146(H)  BUN 6 - 20 mg/dL 7  Creatinine 0.61 - 1.24 mg/dL 0.83  Sodium 135 - 145 mmol/L 130(L)  Potassium 3.5 - 5.1 mmol/L 4.0  Chloride 98 - 111 mmol/L 96(L)  CO2 22 - 32 mmol/L 25  Calcium 8.9 - 10.3 mg/dL 8.8(L)  Total Protein 6.5 - 8.1 g/dL 7.0  Total Bilirubin 0.3 - 1.2 mg/dL 0.1(L)  Alkaline Phos 38 - 126 U/L 56  AST 15 - 41 U/L 17  ALT 0 - 44 U/L 12   CBC Latest Ref Rng & Units 12/28/2021  WBC 4.0 - 10.5 K/uL 9.7  Hemoglobin 13.0 - 17.0 g/dL 13.4  Hematocrit 39.0 - 52.0 % 40.2  Platelets 150 - 400 K/uL 297   No  images are attached to the encounter.  No results found.   Assessment and plan- Patient is a 56 y.o. male with h/o stage III squamous cell carcinoma of the left upper lobe T3 N1 M0.  He is here for on treatment assessment prior to cycle 17 of maintenance durvalumab  Previously, CT showed overall ongoing response to treatment with no evidence of progressive disease.  Plan is to complete 3 additional cycles of durvalumab which would be 1 year total of adjuvant treatment.  Previously, he did have an interim break for about 3 months.  Labs today reviewed and acceptable for continuation of treatment.  Proceed with cycle 17 of durvalumab today.  Autoimmune hypothyroidism-TSH pending at time of visit however, TSH continues to be elevated at 29.  Patient reports compliance with medication.  Will increase levothyroxine to 137 mcg daily (previously on levothyroxine 125 mcg). Prescription sent today. Plan to monitor. Tsh monthly.   Neoplasm related pain-PDMP reviewed today.  We will refill pain medication.  Hyponatremia-sodium 130.  Monitor.  He will follow-up with Dr. Janese Banks as scheduled for labs and consideration of cycle 18 of durvalumab in 4 weeks.   Visit Diagnosis 1. Encounter for antineoplastic immunotherapy   2. Malignant neoplasm of unspecified part of unspecified bronchus or lung ([place])   3. Neoplasm related pain   4. Hyponatremia    Beckey Rutter, DNP, AGNP-C [facility] at [facility] [phone] (clinic) 12/28/2021

## 2021-12-28 NOTE — Progress Notes (Signed)
Patient reports left ribcage pain for about 3 weeks (4/10)

## 2021-12-28 NOTE — Patient Instructions (Signed)
[facility] AT [place]  Discharge Instructions: Thank you for choosing [facility] to provide your oncology and hematology care.  If you have a lab appointment with the [facility], please go directly to the [facility] and check in at the registration area.  Wear comfortable clothing and clothing appropriate for easy access to any Portacath or PICC line.   We strive to give you quality time with your provider. You may need to reschedule your appointment if you arrive late (15 or more minutes).  Arriving late affects you and other patients whose appointments are after yours.  Also, if you miss three or more appointments without notifying the office, you may be dismissed from the clinic at the providers discretion.      For prescription refill requests, have your pharmacy contact our office and allow 72 hours for refills to be completed.    Today you received the following chemotherapy and/or immunotherapy agents durvalumab   To help prevent nausea and vomiting after your treatment, we encourage you to take your nausea medication as directed.  BELOW ARE SYMPTOMS THAT SHOULD BE REPORTED IMMEDIATELY: *FEVER GREATER THAN 100.4 F (38 C) OR HIGHER *CHILLS OR SWEATING *NAUSEA AND VOMITING THAT IS NOT CONTROLLED WITH YOUR NAUSEA MEDICATION *UNUSUAL SHORTNESS OF BREATH *UNUSUAL BRUISING OR BLEEDING *URINARY PROBLEMS (pain or burning when urinating, or frequent urination) *BOWEL PROBLEMS (unusual diarrhea, constipation, pain near the anus) TENDERNESS IN MOUTH AND THROAT WITH OR WITHOUT PRESENCE OF ULCERS (sore throat, sores in mouth, or a toothache) UNUSUAL RASH, SWELLING OR PAIN  UNUSUAL VAGINAL DISCHARGE OR ITCHING   Items with * indicate a potential emergency and should be followed up as soon as possible or go to the Emergency Department if any problems should occur.  Please show the CHEMOTHERAPY ALERT CARD or IMMUNOTHERAPY ALERT CARD at check-in to the  Emergency Department and triage nurse.  Should you have questions after your visit or need to cancel or reschedule your appointment, please contact [facility]CER [place] AT [place]  [phone] and follow the prompts.  Office hours are 8:00 a.m. to 4:30 p.m. Monday - Friday. Please note that voicemails left after 4:00 p.m. may not be returned until the following business day.  We are closed weekends and major holidays. You have access to a nurse at all times for urgent questions. Please call the main number to the clinic [phone] and follow the prompts.  For any non-urgent questions, you may also contact your provider using MyChart. We now offer e-Visits for anyone 74 and older to request care online for non-urgent symptoms. For details visit [email or web address].   Also download the MyChart app! Go to the app store, search "MyChart", open the app, select [facility], and log in with your MyChart username and password.  Due to Covid, a mask is required upon entering the hospital/clinic. If you do not have a mask, one will be given to you upon arrival. For doctor visits, patients may have 1 support person aged 32 or older with them. For treatment visits, patients cannot have anyone with them due to current Covid guidelines and our immunocompromised population.

## 2022-01-06 ENCOUNTER — Ambulatory Visit
Admission: RE | Admit: 2022-01-06 | Discharge: 2022-01-06 | Disposition: A | Discharge status: Home / Self Care | Payer: Medicaid Other | Source: Ambulatory Visit | Attending: Oncology | Admitting: Oncology

## 2022-01-06 ENCOUNTER — Other Ambulatory Visit: Payer: Self-pay

## 2022-01-06 DIAGNOSIS — C3490 Malignant neoplasm of unspecified part of unspecified bronchus or lung: Secondary | ICD-10-CM | POA: Diagnosis present

## 2022-01-06 DIAGNOSIS — R519 Headache, unspecified: Secondary | ICD-10-CM | POA: Diagnosis not present

## 2022-01-06 MED ORDER — GADOBUTROL 1 MMOL/ML IV SOLN
8.0000 mL | Freq: Once | INTRAVENOUS | Status: AC | PRN
  Administered 2022-01-06: 8 mL via INTRAVENOUS

## 2022-01-11 ENCOUNTER — Ambulatory Visit: Payer: Medicaid Other

## 2022-01-11 ENCOUNTER — Ambulatory Visit: Payer: Medicaid Other | Admitting: Oncology

## 2022-01-11 ENCOUNTER — Other Ambulatory Visit: Payer: Medicaid Other

## 2022-01-20 ENCOUNTER — Encounter: Payer: Self-pay | Admitting: Oncology

## 2022-01-21 ENCOUNTER — Telehealth: Payer: Self-pay | Admitting: Nurse Practitioner

## 2022-01-21 DIAGNOSIS — R2231 Localized swelling, mass and lump, right upper limb: Secondary | ICD-10-CM

## 2022-01-21 NOTE — Telephone Encounter (Signed)
Called patient to follow up on mychart message. No answer and unable to leave voicemail. Can see him Monday if he'd like or be evaluated in Urgent Care.

## 2022-01-21 NOTE — Telephone Encounter (Signed)
I left a message with pt's daughter about we can see pt with NP on Monday if they can call back , I called pt no answer and no voicemail set up

## 2022-01-25 ENCOUNTER — Other Ambulatory Visit: Payer: Self-pay | Admitting: *Deleted

## 2022-01-25 ENCOUNTER — Inpatient Hospital Stay: Payer: Medicaid Other

## 2022-01-25 ENCOUNTER — Inpatient Hospital Stay (HOSPITAL_BASED_OUTPATIENT_CLINIC_OR_DEPARTMENT_OTHER): Payer: Medicaid Other | Admitting: Oncology

## 2022-01-25 ENCOUNTER — Encounter: Payer: Self-pay | Admitting: Oncology

## 2022-01-25 ENCOUNTER — Other Ambulatory Visit: Payer: Self-pay

## 2022-01-25 VITALS — BP 98/63 | HR 86 | Temp 97.7°F | Resp 16 | Ht 74.0 in | Wt 182.5 lb

## 2022-01-25 DIAGNOSIS — Z5112 Encounter for antineoplastic immunotherapy: Secondary | ICD-10-CM

## 2022-01-25 DIAGNOSIS — C3490 Malignant neoplasm of unspecified part of unspecified bronchus or lung: Secondary | ICD-10-CM

## 2022-01-25 DIAGNOSIS — L02413 Cutaneous abscess of right upper limb: Secondary | ICD-10-CM | POA: Diagnosis not present

## 2022-01-25 DIAGNOSIS — E871 Hypo-osmolality and hyponatremia: Secondary | ICD-10-CM

## 2022-01-25 DIAGNOSIS — E063 Autoimmune thyroiditis: Secondary | ICD-10-CM

## 2022-01-25 DIAGNOSIS — G893 Neoplasm related pain (acute) (chronic): Secondary | ICD-10-CM

## 2022-01-25 LAB — COMPREHENSIVE METABOLIC PANEL
ALT: 9 U/L (ref 0–44)
AST: 13 U/L — ABNORMAL LOW (ref 15–41)
Albumin: 4 g/dL (ref 3.5–5.0)
Alkaline Phosphatase: 56 U/L (ref 38–126)
Anion gap: 9 (ref 5–15)
BUN: 10 mg/dL (ref 6–20)
CO2: 27 mmol/L (ref 22–32)
Calcium: 9.3 mg/dL (ref 8.9–10.3)
Chloride: 96 mmol/L — ABNORMAL LOW (ref 98–111)
Creatinine, Ser: 0.75 mg/dL (ref 0.61–1.24)
GFR, Estimated: >60 mL/min (ref >60)
Glucose, Bld: 90 mg/dL (ref 70–99)
Potassium: 4.5 mmol/L (ref 3.5–5.1)
Sodium: 132 mmol/L — ABNORMAL LOW (ref 135–145)
Total Bilirubin: <0.1 mg/dL — ABNORMAL LOW (ref 0.3–1.2)
Total Protein: 7.2 g/dL (ref 6.5–8.1)

## 2022-01-25 LAB — CBC WITH DIFFERENTIAL/PLATELET
Abs Immature Granulocytes: 0.08 10*3/uL — ABNORMAL HIGH (ref 0.00–0.07)
Basophils Absolute: 0.1 10*3/uL (ref 0.0–0.1)
Basophils Relative: 1 %
Eosinophils Absolute: 0.5 10*3/uL (ref 0.0–0.5)
Eosinophils Relative: 4 %
HCT: 39 % (ref 39.0–52.0)
Hemoglobin: 13.1 g/dL (ref 13.0–17.0)
Immature Granulocytes: 1 %
Lymphocytes Relative: 12 %
Lymphs Abs: 1.4 10*3/uL (ref 0.7–4.0)
MCH: 29.6 pg (ref 26.0–34.0)
MCHC: 33.6 g/dL (ref 30.0–36.0)
MCV: 88.2 fL (ref 80.0–100.0)
Monocytes Absolute: 1.2 10*3/uL — ABNORMAL HIGH (ref 0.1–1.0)
Monocytes Relative: 10 %
Neutro Abs: 8.7 10*3/uL — ABNORMAL HIGH (ref 1.7–7.7)
Neutrophils Relative %: 72 %
Platelets: 333 10*3/uL (ref 150–400)
RBC: 4.42 MIL/uL (ref 4.22–5.81)
RDW: 13.3 % (ref 11.5–15.5)
WBC: 12 10*3/uL — ABNORMAL HIGH (ref 4.0–10.5)
nRBC: 0 % (ref 0.0–0.2)

## 2022-01-25 LAB — TSH: TSH: 15.596 u[IU]/mL — ABNORMAL HIGH (ref 0.350–4.500)

## 2022-01-25 MED ORDER — SODIUM CHLORIDE 0.9 % IV SOLN
Freq: Once | INTRAVENOUS | Status: AC
  Filled 2022-01-25: qty 250

## 2022-01-25 MED ORDER — SODIUM CHLORIDE 0.9 % IV SOLN
1500.0000 mg | Freq: Once | INTRAVENOUS | Status: AC
  Administered 2022-01-25: 1500 mg via INTRAVENOUS
  Filled 2022-01-25: qty 30

## 2022-01-25 MED ORDER — DOXYCYCLINE HYCLATE 100 MG PO TABS: 100.0000 mg | ORAL_TABLET | Freq: Two times a day (BID) | ORAL | 0 refills | Status: DC

## 2022-01-25 NOTE — Progress Notes (Signed)
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  He had a chest x-ray followed by a CT scan which showed a 5.7 x 4.4 cm left upper lobe mass along with left hilar adenopathy.  There was a low-density 3.6 lesion noted in the right hepatic lobe which ultimately turned out to be a hemangioma on MRI liver.  PET CT scan showed hypermetabolic activity in the left upper lobe and left hilar nodal tissue.  Subcarinal and right paratracheal lymph nodes with mild hypermetabolic features.   Patient underwent bronchoscopies and biopsies.Left upper lobe biopsy was consistent with non-small cell carcinoma favor squamous cell carcinoma right subcarinal lymph node biopsy was negative for malignancy.   Plan is for concurrent chemoradiation with carbotaxol followed by maintenance durvalumab.  Patient does not want to get a port placed.  He is also refused Covid vaccination   NGS testing showed high tumor mutational burden.  PD-L1 90%.  MSI stable.  Patient PIK3CA and notch2 FCHO2 fusion, T p53   Scans in May 2022 Showed 2 hypermetabolic nodules in the left upper lobe and new hypermetabolic cavitary nodule in the right upper lobe concerning for disease progression.  No evidence of distant metastatic disease.  Patient was  seen by Dr. Donella Stade and received SBRT to the left lung with plans for continued monitoring of the right upper lobe lung nodule.  Patient received last Durvalumab on 05/07/2021.  Durvalumab was on hold during radiation treatment until 06/29/2021 but patient did not follow-up after that to resume treatment      Interval history-patient reports ongoing right arm swelling which has been getting painful and now has a pus pointing.  Denies any fevers  ECOG PS- 1 Pain scale- 3 Opioid associated constipation- no  Review of systems- Review of Systems  Constitutional:  Negative for chills, fever, malaise/fatigue and weight loss.  HENT:  Negative for congestion, ear discharge and nosebleeds.   Eyes:  Negative for blurred vision.  Respiratory:  Negative for cough, hemoptysis, sputum production, shortness of breath and wheezing.   Cardiovascular:  Negative for chest pain, palpitations, orthopnea and claudication.  Gastrointestinal:  Negative for abdominal pain, blood in stool, constipation, diarrhea, heartburn, melena, nausea and vomiting.  Genitourinary:  Negative for dysuria, flank pain, frequency, hematuria and urgency.  Musculoskeletal:  Negative for back pain, joint pain and myalgias.       Right arm pain and swelling  Skin:  Negative for rash.  Neurological:  Negative for dizziness, tingling, focal weakness, seizures, weakness and headaches.  Endo/Heme/Allergies:  Does not bruise/bleed easily.  Psychiatric/Behavioral:  Negative for depression and suicidal ideas. The patient does not have insomnia.      No Known Allergies   Past Medical History:  Diagnosis Date   COPD (chronic obstructive pulmonary disease) ([place])    Emphysema of lung ([place])  Lung cancer ([place]) 06/2020   Pneumonia      Past Surgical History:  Procedure Laterality Date   BACK SURGERY  2017   lumbar region herniated disc   VIDEO BRONCHOSCOPY WITH ENDOBRONCHIAL NAVIGATION N/A 08/07/2020   Procedure: VIDEO BRONCHOSCOPY WITH  ENDOBRONCHIAL NAVIGATION;  Surgeon: Tyler Pita, MD;  Location: [facility] ORS;  Service: Pulmonary;  Laterality: N/A;   VIDEO BRONCHOSCOPY WITH ENDOBRONCHIAL ULTRASOUND N/A 08/07/2020   Procedure: VIDEO BRONCHOSCOPY WITH ENDOBRONCHIAL ULTRASOUND;  Surgeon: Tyler Pita, MD;  Location: [facility] ORS;  Service: Pulmonary;  Laterality: N/A;    Social History   Socioeconomic History   Marital status: Divorced    Spouse name: Not on file   Number of children: Not on file   Years of education: Not on file   Highest education level: Not on file  Occupational History   Not on file  Tobacco Use   Smoking status: Every Day    Packs/day: 2.00    Years: 35.00    Pack years: 70.00    Types: Cigarettes   Smokeless tobacco: Former    Types: Chew   Tobacco comments:    2PPD 01/19/2021  Vaping Use   Vaping Use: Never used  Substance and Sexual Activity   Alcohol use: Yes    Alcohol/week: 4.0 standard drinks    Types: 4 Cans of beer per week   Drug use: No   Sexual activity: Yes  Other Topics Concern   Not on file  Social History Narrative   Not on file   Social Determinants of Health   Financial Resource Strain: Not on file  Food Insecurity: Not on file  Transportation Needs: Not on file  Physical Activity: Not on file  Stress: Not on file  Social Connections: Not on file  Intimate Partner Violence: Not on file    Family History  Problem Relation Age of Onset   Diabetes Father    Lung cancer Father 73   Heart attack Father    Throat cancer Maternal Aunt      Current Outpatient Medications:    acetaminophen (TYLENOL) 500 MG tablet, Take 500-1,000 mg by mouth every 6 (six) hours as needed (for pain.)., Disp: , Rfl:    budesonide-formoterol (SYMBICORT) 160-4.5 MCG/ACT inhaler, Inhale 2 puffs into the lungs in the morning and at bedtime., Disp: 1 each, Rfl: 12   doxycycline (VIBRA-TABS) 100 MG tablet, Take 1 tablet (100 mg total) by mouth 2 (two) times daily., Disp: 14  tablet, Rfl: 0   levothyroxine (SYNTHROID) 137 MCG tablet, Take 1 tablet (137 mcg total) by mouth daily before breakfast., Disp: 90 tablet, Rfl: 0   morphine (MS CONTIN) 15 MG 12 hr tablet, Take 1 tablet (15 mg total) by mouth every 12 (twelve) hours., Disp: 60 tablet, Rfl: 0   oxyCODONE (OXY IR/ROXICODONE) 5 MG immediate release tablet, Take 1 tablet (5 mg total) by mouth every 4 (four) hours as needed for severe pain., Disp: 60 tablet, Rfl: 0   VENTOLIN HFA 108 (90 Base) MCG/ACT inhaler, TAKE 2 PUFFS BY MOUTH EVERY 6 HOURS AS NEEDED FOR WHEEZE OR SHORTNESS OF BREATH, Disp: 18 each, Rfl: 2  Physical exam:  Vitals:   01/25/22 0946  BP: 98/63  Pulse: 86  Resp: 16  Temp: 97.7 F (36.5 C)  TempSrc: Tympanic  SpO2: 99%  Weight: 182 lb 8 oz (82.8 kg)  Height: $Remove'6\' 2"'qmnhvvG$  (1.88 m)   Physical Exam Constitutional:      General: He is  not in acute distress. Cardiovascular:     Rate and Rhythm: Normal rate and regular rhythm.     Heart sounds: Normal heart sounds.  Pulmonary:     Effort: Pulmonary effort is normal.     Breath sounds: Normal breath sounds.  Abdominal:     General: Bowel sounds are normal.     Palpations: Abdomen is soft.  Musculoskeletal:     Comments: Large roughly 5 cm spherical abscess noted over the right arm  Skin:    General: Skin is warm and dry.  Neurological:     Mental Status: He is alert and oriented to person, place, and time.     CMP Latest Ref Rng & Units 01/25/2022  Glucose 70 - 99 mg/dL 90  BUN 6 - 20 mg/dL 10  Creatinine 0.61 - 1.24 mg/dL 0.75  Sodium 135 - 145 mmol/L 132(L)  Potassium 3.5 - 5.1 mmol/L 4.5  Chloride 98 - 111 mmol/L 96(L)  CO2 22 - 32 mmol/L 27  Calcium 8.9 - 10.3 mg/dL 9.3  Total Protein 6.5 - 8.1 g/dL 7.2  Total Bilirubin 0.3 - 1.2 mg/dL <0.1(L)  Alkaline Phos 38 - 126 U/L 56  AST 15 - 41 U/L 13(L)  ALT 0 - 44 U/L 9   CBC Latest Ref Rng & Units 01/25/2022  WBC 4.0 - 10.5 K/uL 12.0(H)  Hemoglobin 13.0 - 17.0 g/dL 13.1   Hematocrit 39.0 - 52.0 % 39.0  Platelets 150 - 400 K/uL 333    No images are attached to the encounter.  MR Brain W Wo Contrast  Result Date: 01/06/2022 CLINICAL DATA:  Frequent headaches.  History of lung cancer. EXAM: MRI HEAD WITHOUT AND WITH CONTRAST TECHNIQUE: Multiplanar, multiecho pulse sequences of the brain and surrounding structures were obtained without and with intravenous contrast. CONTRAST:  37mL GADAVIST GADOBUTROL 1 MMOL/ML IV SOLN COMPARISON:  08/24/2020 FINDINGS: Brain: Diffusion imaging does not show any acute or subacute infarction or other cause of restricted diffusion. No abnormality affects the brainstem or cerebellum. Few punctate foci of T2 and FLAIR signal within the cerebral hemispheric white matter consistent with minimal small vessel ischemic change. Subcentimeter right posterior frontal cortical infarction. No evidence of mass lesion, hemorrhage, hydrocephalus or extra-axial collection. After contrast administration, no abnormal enhancement occurs. Vascular: Major vessels at the base of the brain show flow. Skull and upper cervical spine: Negative Sinuses/Orbits: Paranasal sinuses are clear.  Orbits are normal. Other: None IMPRESSION: No acute finding. No evidence of metastatic disease. No cause of headache is identified. Minimal small-vessel ischemic change of the cerebral hemispheric white matter. 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 Electronically Signed   By: Nelson Chimes M.D.   On: 01/06/2022 20:21     Assessment and plan- Patient is a 56 y.o. male with h/o stage III squamous cell carcinoma of the left upper lobe T3 N1 M0.  He is here for on treatment assessment prior to cycle 17 of maintenance durvalumab  Counts okay to proceed with cycle 17 of maintenance durvalumab today which he is receiving monthly for the last couple of months.  He did have a break of about 2 months when he did not show up for appointments and missed his treatments.  My  plan is to give him 2 more treatments of monthly durvalumab following which he will stop and continue to observe him without further treatments.  I will see him back in 4 weeks with CBC with differential CMP before next cycle of maintenance durvalumab  and he will need staging scans prior.  Right arm abscess: I have started him on doxycycline 100 mg twice daily for 7 days.  I have also touch base with Dr. Dahlia Byes from general surgery who will see him tomorrow for potential drainage of the abscess   Visit Diagnosis 1. Malignant neoplasm of unspecified part of unspecified bronchus or lung ([place])   2. Encounter for antineoplastic immunotherapy   3. Abscess of right arm      Dr. Randa Evens, MD, MPH [facility] [phone] 01/25/2022 12:46 PM

## 2022-01-25 NOTE — Patient Instructions (Signed)
[facility] AT [place]  Discharge Instructions: Thank you for choosing [facility] to provide your oncology and hematology care.  If you have a lab appointment with the [facility], please go directly to the [facility] and check in at the registration area.  Wear comfortable clothing and clothing appropriate for easy access to any Portacath or PICC line.   We strive to give you quality time with your provider. You may need to reschedule your appointment if you arrive late (15 or more minutes).  Arriving late affects you and other patients whose appointments are after yours.  Also, if you miss three or more appointments without notifying the office, you may be dismissed from the clinic at the providers discretion.      For prescription refill requests, have your pharmacy contact our office and allow 72 hours for refills to be completed.    Today you received the following chemotherapy and/or immunotherapy agents    IMFINZI   To help prevent nausea and vomiting after your treatment, we encourage you to take your nausea medication as directed.  BELOW ARE SYMPTOMS THAT SHOULD BE REPORTED IMMEDIATELY: *FEVER GREATER THAN 100.4 F (38 C) OR HIGHER *CHILLS OR SWEATING *NAUSEA AND VOMITING THAT IS NOT CONTROLLED WITH YOUR NAUSEA MEDICATION *UNUSUAL SHORTNESS OF BREATH *UNUSUAL BRUISING OR BLEEDING *URINARY PROBLEMS (pain or burning when urinating, or frequent urination) *BOWEL PROBLEMS (unusual diarrhea, constipation, pain near the anus) TENDERNESS IN MOUTH AND THROAT WITH OR WITHOUT PRESENCE OF ULCERS (sore throat, sores in mouth, or a toothache) UNUSUAL RASH, SWELLING OR PAIN  UNUSUAL VAGINAL DISCHARGE OR ITCHING   Items with * indicate a potential emergency and should be followed up as soon as possible or go to the Emergency Department if any problems should occur.  Please show the CHEMOTHERAPY ALERT CARD or IMMUNOTHERAPY ALERT CARD at check-in to the  Emergency Department and triage nurse.  Should you have questions after your visit or need to cancel or reschedule your appointment, please contact [facility] [place] AT [place]  [phone] and follow the prompts.  Office hours are 8:00 a.m. to 4:30 p.m. Monday - Friday. Please note that voicemails left after 4:00 p.m. may not be returned until the following business day.  We are closed weekends and major holidays. You have access to a nurse at all times for urgent questions. Please call the main number to the clinic [phone] and follow the prompts.  For any non-urgent questions, you may also contact your provider using MyChart. We now offer e-Visits for anyone 26 and older to request care online for non-urgent symptoms. For details visit [email or web address].   Also download the MyChart app! Go to the app store, search "MyChart", open the app, select [facility], and log in with your MyChart username and password.  Due to Covid, a mask is required upon entering the hospital/clinic. If you do not have a mask, one will be given to you upon arrival. For doctor visits, patients may have 1 support person aged 85 or older with them. For treatment visits, patients cannot have anyone with them due to current Covid guidelines and our immunocompromised population.  Durvalumab injection What is this medication? DURVALUMAB (dur VAL ue mab) is a monoclonal antibody. It is used to treat lung cancer. This medicine may be used for other purposes; ask your health care provider or pharmacist if you have questions. COMMON BRAND NAME(S): IMFINZI What should I tell my care team before I take this  medication? They need to know if you have any of these conditions: autoimmune diseases like Crohn's disease, ulcerative colitis, or lupus have had or planning to have an allogeneic stem cell transplant (uses someone else's stem cells) history of organ transplant history of radiation to the  chest nervous system problems like myasthenia gravis or Guillain-Barre syndrome an unusual or allergic reaction to durvalumab, other medicines, foods, dyes, or preservatives pregnant or trying to get pregnant breast-feeding How should I use this medication? This medicine is for infusion into a vein. It is given by a health care professional in a hospital or clinic setting. A special MedGuide will be given to you before each treatment. Be sure to read this information carefully each time. Talk to your pediatrician regarding the use of this medicine in children. Special care may be needed. Overdosage: If you think you have taken too much of this medicine contact a poison control center or emergency room at once. NOTE: This medicine is only for you. Do not share this medicine with others. What if I miss a dose? It is important not to miss your dose. Call your doctor or health care professional if you are unable to keep an appointment. What may interact with this medication? Interactions have not been studied. This list may not describe all possible interactions. Give your health care provider a list of all the medicines, herbs, non-prescription drugs, or dietary supplements you use. Also tell them if you smoke, drink alcohol, or use illegal drugs. Some items may interact with your medicine. What should I watch for while using this medication? This medication may make you feel generally unwell. Continue your course of treatment even though you feel ill unless your care team tells you to stop. You may need blood work done while you are taking this medication. Do not become pregnant while taking this medication or for 3 months after stopping it. Women should inform their care team if they wish to become pregnant or think they might be pregnant. There is a potential for serious side effects to an unborn child. Talk to your care team or pharmacist for more information. Do not breast-feed an infant while  taking this medication or for 3 months after stopping it. What side effects may I notice from receiving this medication? Side effects that you should report to your care team as soon as possible: Allergic reactions--skin rash, itching, hives, swelling of the face, lips, tongue, or throat Bloody or watery diarrhea Dizziness, loss of balance or coordination, confusion or trouble speaking Dry cough, shortness of breath or trouble breathing Flushing, mostly over the face, neck, and chest, during injection High blood sugar (hyperglycemia)--increased thirst or amount of urine, unusual weakness or fatigue, blurry vision High thyroid levels (hyperthyroidism)--fast or irregular heartbeat, weight loss, excessive sweating or sensitivity to heat, tremors or shaking, anxiety, nervousness, irregular menstrual cycle or spotting Infection--fever, chills, cough, or sore throat Liver injury--right upper belly pain, loss of appetite, nausea, light-colored stool, dark yellow or brown urine, yellowing skin or eyes, unusual weakness or fatigue Low adrenal gland function--nausea, vomiting, loss of appetite, unusual weakness or fatigue, dizziness, low blood pressure Low thyroid levels (hypothyroidism)--unusual weakness or fatigue, increased sensitivity to cold, constipation, hair loss, dry skin, weight gain, feelings of depression Pancreatitis--severe stomach pain that spreads to your back or gets worse after eating or when touched, fever, nausea, vomiting Rash, fever, and swollen lymph nodes Redness, blistering, peeling or loosening of the skin, including inside the mouth Wheezing--trouble breathing with loud  or whistling sounds Side effects that usually do not require medical attention (report these to your care team if they continue or are bothersome): Fatigue Hair loss This list may not describe all possible side effects. Call your doctor for medical advice about side effects. You may report side effects to FDA at  1-800-FDA-1088. Where should I keep my medication? This medication is given in a hospital or clinic. It will not be stored at home. NOTE: This sheet is a summary. It may not cover all possible information. If you have questions about this medicine, talk to your doctor, pharmacist, or health care provider.  2022 Elsevier/Gold Standard (2021-08-31 00:00:00)

## 2022-01-26 ENCOUNTER — Encounter: Payer: Self-pay | Admitting: Surgery

## 2022-01-26 ENCOUNTER — Ambulatory Visit: Payer: Medicaid Other | Admitting: Surgery

## 2022-01-26 ENCOUNTER — Other Ambulatory Visit: Payer: Self-pay

## 2022-01-26 ENCOUNTER — Encounter: Payer: Self-pay | Admitting: Oncology

## 2022-01-26 ENCOUNTER — Other Ambulatory Visit: Payer: Self-pay | Admitting: Surgery

## 2022-01-26 VITALS — BP 109/70 | HR 91 | Temp 99.0°F | Ht 74.0 in | Wt 183.2 lb

## 2022-01-26 DIAGNOSIS — L720 Epidermal cyst: Secondary | ICD-10-CM | POA: Diagnosis not present

## 2022-01-26 DIAGNOSIS — L0291 Cutaneous abscess, unspecified: Secondary | ICD-10-CM | POA: Diagnosis not present

## 2022-01-26 NOTE — Progress Notes (Signed)
Patient ID: Wesley Ferguson, male   DOB: 18-Sep-1966, 56 y.o.   MRN: [id number]  HPI Wesley Ferguson is a 56 y.o. male seen in consultation at the request of Dr. Janese Banks for a large right arm abscess.  He has had history of prior epidermal inclusion cyst.  Does have a history of squamous cell carcinoma of the left upper lobe getting chemo and radiation therapy.  Over the last week or so started having pain on the right arm.  Pain is moderate sharp and worsening with movement.  No fevers or chills.No fevers or chills  WBC 12 hb 13 PL nml, CMP Na 132 cl 96 creat .63 HPI  Past Medical History:  Diagnosis Date   COPD (chronic obstructive pulmonary disease) ([place])    Emphysema of lung ([place])    Lung cancer ([place]) 06/2020   Pneumonia     Past Surgical History:  Procedure Laterality Date   BACK SURGERY  2017   lumbar region herniated disc   VIDEO BRONCHOSCOPY WITH ENDOBRONCHIAL NAVIGATION N/A 08/07/2020   Procedure: VIDEO BRONCHOSCOPY WITH ENDOBRONCHIAL NAVIGATION;  Surgeon: Tyler Pita, MD;  Location: [facility] ORS;  Service: Pulmonary;  Laterality: N/A;   VIDEO BRONCHOSCOPY WITH ENDOBRONCHIAL ULTRASOUND N/A 08/07/2020   Procedure: VIDEO BRONCHOSCOPY WITH ENDOBRONCHIAL ULTRASOUND;  Surgeon: Tyler Pita, MD;  Location: [facility] ORS;  Service: Pulmonary;  Laterality: N/A;    Family History  Problem Relation Age of Onset   Diabetes Father    Lung cancer Father 53   Heart attack Father    Throat cancer Maternal Aunt     Social History Social History   Tobacco Use   Smoking status: Every Day    Packs/day: 2.00    Years: 35.00    Pack years: 70.00    Types: Cigarettes   Smokeless tobacco: Former    Types: Chew   Tobacco comments:    2PPD 01/19/2021  Vaping Use   Vaping Use: Never used  Substance Use Topics   Alcohol use: Yes    Alcohol/week: 4.0 standard drinks    Types: 4 Cans of beer per week   Drug use: No    No Known Allergies  Current Outpatient Medications  Medication Sig  Dispense Refill   acetaminophen (TYLENOL) 500 MG tablet Take 500-1,000 mg by mouth every 6 (six) hours as needed (for pain.).     budesonide-formoterol (SYMBICORT) 160-4.5 MCG/ACT inhaler Inhale 2 puffs into the lungs in the morning and at bedtime. 1 each 12   doxycycline (VIBRA-TABS) 100 MG tablet Take 1 tablet (100 mg total) by mouth 2 (two) times daily. 14 tablet 0   levothyroxine (SYNTHROID) 137 MCG tablet Take 1 tablet (137 mcg total) by mouth daily before breakfast. 90 tablet 0   morphine (MS CONTIN) 15 MG 12 hr tablet Take 1 tablet (15 mg total) by mouth every 12 (twelve) hours. 60 tablet 0   oxyCODONE (OXY IR/ROXICODONE) 5 MG immediate release tablet Take 1 tablet (5 mg total) by mouth every 4 (four) hours as needed for severe pain. 60 tablet 0   VENTOLIN HFA 108 (90 Base) MCG/ACT inhaler TAKE 2 PUFFS BY MOUTH EVERY 6 HOURS AS NEEDED FOR WHEEZE OR SHORTNESS OF BREATH 18 each 2   No current facility-administered medications for this visit.     Review of Systems Full ROS  was asked and was negative except for the information on the HPI  Physical Exam Blood pressure 109/70, pulse 91, temperature 99 F (37.2 C), temperature  source Oral, height 6\' 2"  (1.88 m), weight 183 lb 3.2 oz (83.1 kg), SpO2 97 %. CONSTITUTIONAL: NAD. EYES: Pupils are equal, round,  Sclera are non-icteric. EARS, NOSE, MOUTH AND THROAT: The oropharynx is clear. The oral mucosa is pink and moist. Hearing is intact to voice. LYMPH NODES:  Lymph nodes in the neck are normal. RESPIRATORY:  Lungs are clear. There is normal respiratory effort, with equal breath sounds bilaterally, and without pathologic use of accessory muscles. CARDIOVASCULAR: Heart is regular without murmurs, gallops, or rubs. GI: The abdomen is  soft, nontender, and nondistended. There are no palpable masses. There is no hepatosplenomegaly. There are normal bowel sounds in all quadrants. GU: Rectal deferred.   MUSCULOSKELETAL: Normal muscle strength  and tone. No cyanosis or edema.   SKIN: There is a large EIC Right upper arm infected and rupture w an abscess cavity. Measures 5cms . NEUROLOGIC: Motor and sensation is grossly normal. Cranial nerves are grossly intact. PSYCH:  Oriented to person, place and time. Affect is normal.  Data Reviewed  I have personally reviewed the patient's imaging, laboratory findings and medical records.    Assessment/Plan Infected ruptured epidermal inclusion cyst on the right upper arm.  He needs excision and debridement of the upper arm lesion.  Discussed with the patient in detail risk, benefits and possible complications including but not limited to, bleeding, infection, recurrence. we will continue antibiotics given his immunocompromise state. Will need daily packing.  Currently he is not toxic and does not need admission to the hospital.  Please note that I spent at least 45 minutes's encounter including reviewing his chart, placing orders, counseling the patient and coordinating his care as well as performing appropriate documentation  Procedure note Dx: infected EIC right arm  Procedures: Excision of Infected ruptured Epidermal inclusion cyst 5cms Debridement of skin, fascia measuring 25 cm 2 ( 5x5 cm cavity  Findings: ruptured EIC right arm, with pus and sebum  Anesthesia: lidocaine 1% w epi 10cc  EBL: minimal  After informed consent was obtained.  Patient was placed in the supine position and the area of interest was prepped and draped in the usual fashion.  Lidocaine 1% was infiltrated using a 15 blade knife an incision was created.  We were able to excise the ruptured epidermal inclusion cyst that measured 5 cm.  Were able to remove the capsule using scissors.  There was evidence of significant necrotic tissue within the subcutaneous tissue and we were able to use a knife to excise all this nonviable tissue.  Hemostasis was obtained with pressure.  The wound was irrigated with saline and packed  with half-inch packing.  Complications    Caroleen Hamman, MD FACS General Surgeon 01/26/2022, 3:35 PM

## 2022-01-26 NOTE — Patient Instructions (Signed)
Shower as usual. Remove the packing material from the wound while in the shower. Allow water to run over the wound. Rinse well. Replace new packing material into the wound bed. Cover with dry dressing and secure with tape. You will need to do this everyday until you are no longer able to place any packing material into the wound.   Please continue your Antibiotic until gone.   Please see your follow up appointment listed below.   I have supplied all the items you will need for wound care.

## 2022-01-27 ENCOUNTER — Other Ambulatory Visit: Payer: Self-pay

## 2022-01-27 ENCOUNTER — Ambulatory Visit: Payer: Medicaid Other

## 2022-01-27 DIAGNOSIS — L0291 Cutaneous abscess, unspecified: Secondary | ICD-10-CM

## 2022-01-27 NOTE — Progress Notes (Signed)
Patient ID: Wesley Ferguson, male   DOB: [birth year], 56 y.o.   MRN: [id number] Patient came in today for a wound check.  The wound is clean, with no signs of infection noted. Follow up as scheduled.  Removed old iodoform from wound. Wound looked beefy red and no drainage. Minimal redness. Some mild tenderness.  Repacked wound using 1/2 inch iodoform and applied dry dressing and secured with tape. Patients son in law and daughter watched while wound care was being provided. Patient reminded to do this daily and of his follow up appointment next week.

## 2022-01-27 NOTE — Patient Instructions (Signed)
Removed old iodoform from wound. Wound looked beefy red and no drainage. Minimal redness. Some mild tenderness.  Repacked wound using 1/2 inch iodoform and applied dry dressing and secured with tape. Patients son in law and daughter watched while wound care was being provided. Patient reminded to do this daily and of his follow up appointment next week.

## 2022-02-01 ENCOUNTER — Encounter: Payer: Self-pay | Admitting: Physician Assistant

## 2022-02-01 ENCOUNTER — Other Ambulatory Visit: Payer: Self-pay

## 2022-02-01 ENCOUNTER — Ambulatory Visit (INDEPENDENT_AMBULATORY_CARE_PROVIDER_SITE_OTHER): Payer: Medicaid Other | Admitting: Surgery

## 2022-02-01 VITALS — BP 126/68 | HR 82 | Temp 98.8°F | Ht 75.0 in | Wt 185.0 lb

## 2022-02-01 DIAGNOSIS — Z09 Encounter for follow-up examination after completed treatment for conditions other than malignant neoplasm: Secondary | ICD-10-CM

## 2022-02-01 DIAGNOSIS — L0291 Cutaneous abscess, unspecified: Secondary | ICD-10-CM

## 2022-02-01 NOTE — Patient Instructions (Signed)
Continue to pack the wound until no longer able to do so.

## 2022-02-01 NOTE — Progress Notes (Signed)
This gentleman presents 6 days following surgical excision of what sounds like a ruptured/infected dermal cyst involving the upper right arm laterally.  He has been doing saline dressing changes with iodoform packing strip on a daily basis. He presents today for wound check.  The diameter of his wound is 2 cm, it is nearly a perfect circle.  Its depth is 3 to 4 mm at most and it is 100% granulated with little or no exudative debris present.  Surrounding skin appears free of erythema and induration. We will continue his daily dressing changes, have him follow-up for wound check in 2 weeks.

## 2022-02-02 ENCOUNTER — Encounter: Payer: Medicaid Other | Admitting: Surgery

## 2022-02-14 ENCOUNTER — Ambulatory Visit: Payer: Self-pay | Admitting: Surgery

## 2022-02-16 ENCOUNTER — Other Ambulatory Visit: Payer: Self-pay

## 2022-02-16 ENCOUNTER — Ambulatory Visit
Admission: RE | Admit: 2022-02-16 | Discharge: 2022-02-16 | Disposition: A | Discharge status: Home / Self Care | Payer: Medicaid Other | Source: Ambulatory Visit | Attending: Oncology | Admitting: Oncology

## 2022-02-16 ENCOUNTER — Ambulatory Visit: Payer: Medicaid Other | Admitting: Surgery

## 2022-02-16 ENCOUNTER — Encounter: Payer: Self-pay | Admitting: Surgery

## 2022-02-16 VITALS — BP 120/77 | HR 80 | Temp 98.7°F | Ht 74.0 in | Wt 184.8 lb

## 2022-02-16 DIAGNOSIS — I2510 Atherosclerotic heart disease of native coronary artery without angina pectoris: Secondary | ICD-10-CM | POA: Diagnosis not present

## 2022-02-16 DIAGNOSIS — L02413 Cutaneous abscess of right upper limb: Secondary | ICD-10-CM

## 2022-02-16 DIAGNOSIS — Z09 Encounter for follow-up examination after completed treatment for conditions other than malignant neoplasm: Secondary | ICD-10-CM

## 2022-02-16 DIAGNOSIS — C3490 Malignant neoplasm of unspecified part of unspecified bronchus or lung: Secondary | ICD-10-CM | POA: Diagnosis not present

## 2022-02-16 DIAGNOSIS — J432 Centrilobular emphysema: Secondary | ICD-10-CM | POA: Diagnosis not present

## 2022-02-16 DIAGNOSIS — K769 Liver disease, unspecified: Secondary | ICD-10-CM | POA: Diagnosis not present

## 2022-02-16 DIAGNOSIS — C3412 Malignant neoplasm of upper lobe, left bronchus or lung: Secondary | ICD-10-CM | POA: Diagnosis not present

## 2022-02-16 DIAGNOSIS — I700 Atherosclerosis of aorta: Secondary | ICD-10-CM | POA: Diagnosis not present

## 2022-02-16 DIAGNOSIS — J929 Pleural plaque without asbestos: Secondary | ICD-10-CM | POA: Diagnosis not present

## 2022-02-16 MED ORDER — IOHEXOL 300 MG/ML  SOLN
100.0000 mL | Freq: Once | INTRAMUSCULAR | Status: AC | PRN
  Administered 2022-02-16: 100 mL via INTRAVENOUS

## 2022-02-16 NOTE — Patient Instructions (Signed)
If you have any concerns or questions, please feel free to call our office. Follow up as needed.    Skin Abscess A skin abscess is an infected area of your skin that contains pus and other material. An abscess can happen in any part of your body. Some abscesses break open (rupture) on their own. Most continue to get worse unless they are treated. The infection can spread deeper into the body and into your blood, which can make you feel sick. A skin abscess is caused by germs that enter the skin through a cut or scrape. It can also be caused by blocked oil and sweat glands or infected hair follicles. This condition is usually treated by: Draining the pus. Taking antibiotic medicines. Placing a warm, wet washcloth over the abscess. Follow these instructions at home: Medicines  Take over-the-counter and prescription medicines only as told by your doctor. If you were prescribed an antibiotic medicine, take it as told by your doctor. Do not stop taking the antibiotic even if you start to feel better. Abscess care  If you have an abscess that has not drained, place a warm, clean, wet washcloth over the abscess several times a day. Do this as told by your doctor. Follow instructions from your doctor about how to take care of your abscess. Make sure you: Cover the abscess with a bandage (dressing). Change your bandage or gauze as told by your doctor. Wash your hands with soap and water before you change the bandage or gauze. If you cannot use soap and water, use hand sanitizer. Check your abscess every day for signs that the infection is getting worse. Check for: More redness, swelling, or pain. More fluid or blood. Warmth. More pus or a bad smell. General instructions To avoid spreading the infection: Do not share personal care items, towels, or hot tubs with others. Avoid making skin-to-skin contact with other people. Keep all follow-up visits as told by your doctor. This is  important. Contact a doctor if: You have more redness, swelling, or pain around your abscess. You have more fluid or blood coming from your abscess. Your abscess feels warm when you touch it. You have more pus or a bad smell coming from your abscess. You have a fever. Your muscles ache. You have chills. You feel sick. Get help right away if: You have very bad (severe) pain. You see red streaks on your skin spreading away from the abscess. Summary A skin abscess is an infected area of your skin that contains pus and other material. The abscess is caused by germs that enter the skin through a cut or scrape. It can also be caused by blocked oil and sweat glands or infected hair follicles. Follow your doctor's instructions on caring for your abscess, taking medicines, preventing infections, and keeping follow-up visits. This information is not intended to replace advice given to you by your health care provider. Make sure you discuss any questions you have with your health care provider. Document Revised: 07/18/2019 Document Reviewed: 01/25/2018 Elsevier Patient Education  2022 [employer].

## 2022-02-17 NOTE — Progress Notes (Signed)
Outpatient Surgical Follow Up  02/17/2022  Wesley Ferguson is an 56 y.o. male.   Chief Complaint  Patient presents with   Follow-up    Right arm abscess    HPI: S/p  excision right infected EIC Doing well w/o issues. No fevers, no pain Path d/w pt   Past Medical History:  Diagnosis Date   COPD (chronic obstructive pulmonary disease) ([place])    Emphysema of lung ([place])    Lung cancer ([place]) 06/2020   Pneumonia     Past Surgical History:  Procedure Laterality Date   BACK SURGERY  2017   lumbar region herniated disc   VIDEO BRONCHOSCOPY WITH ENDOBRONCHIAL NAVIGATION N/A 08/07/2020   Procedure: VIDEO BRONCHOSCOPY WITH ENDOBRONCHIAL NAVIGATION;  Surgeon: Tyler Pita, MD;  Location: [facility] ORS;  Service: Pulmonary;  Laterality: N/A;   VIDEO BRONCHOSCOPY WITH ENDOBRONCHIAL ULTRASOUND N/A 08/07/2020   Procedure: VIDEO BRONCHOSCOPY WITH ENDOBRONCHIAL ULTRASOUND;  Surgeon: Tyler Pita, MD;  Location: [facility] ORS;  Service: Pulmonary;  Laterality: N/A;    Family History  Problem Relation Age of Onset   Diabetes Father    Lung cancer Father 70   Heart attack Father    Throat cancer Maternal Aunt     Social History:  reports that he has been smoking cigarettes. He has a 70.00 pack-year smoking history. He has quit using smokeless tobacco.  His smokeless tobacco use included chew. He reports current alcohol use of about 4.0 standard drinks per week. He reports that he does not use drugs.  Allergies: No Known Allergies  Medications reviewed.    ROS Full ROS performed and is otherwise negative other than what is stated in HPI   BP 120/77    Pulse 80    Temp 98.7 F (37.1 C) (Oral)    Ht 6\' 2"  (1.88 m)    Wt 184 lb 12.8 oz (83.8 kg)    SpO2 98%    BMI 23.73 kg/m   Physical Exam Vitals and nursing note reviewed. Exam conducted with a chaperone present.  Constitutional:      Appearance: Normal appearance. He is normal weight. He is not ill-appearing.  Pulmonary:      Effort: Pulmonary effort is normal.     Breath sounds: No stridor.  Skin:    General: Skin is warm and dry.     Capillary Refill: Capillary refill takes less than 2 seconds.     Comments: Wound healing well,  no infection, eschar.  Neurological:     General: No focal deficit present.     Mental Status: He is alert and oriented to person, place, and time.  Psychiatric:        Mood and Affect: Mood normal.        Behavior: Behavior normal.        Thought Content: Thought content normal.        Judgment: Judgment normal.       No results found for this or any previous visit (from the past 48 hour(s)). CT CHEST ABDOMEN PELVIS W CONTRAST  Result Date: 02/17/2022 CLINICAL DATA:  Left upper lobe lung cancer restaging, ongoing chemotherapy and immunotherapy EXAM: CT CHEST, ABDOMEN, AND PELVIS WITH CONTRAST TECHNIQUE: Multidetector CT imaging of the chest, abdomen and pelvis was performed following the standard protocol during bolus administration of intravenous contrast. RADIATION DOSE REDUCTION: This exam was performed according to the departmental dose-optimization program which includes automated exposure control, adjustment of the mA and/or kV according to patient size and/or  use of iterative reconstruction technique. CONTRAST:  145mL OMNIPAQUE IOHEXOL 300 MG/ML SOLN, additional oral enteric contrast COMPARISON:  11/11/2021, 08/03/2021 FINDINGS: CT CHEST FINDINGS Cardiovascular: No significant vascular findings. Normal heart size. Scattered left coronary artery calcifications. No pericardial effusion. Mediastinum/Nodes: Multiple unchanged subcentimeter mediastinal lymph nodes (series 2, image 23). Unchanged post treatment appearance of soft tissue about the left hilum (series 2, image 27) thyroid gland, trachea, and esophagus demonstrate no significant findings. Lungs/Pleura: Moderate centrilobular emphysema. Diffuse bilateral bronchial wall thickening. Unchanged post treatment appearance of a mass  of the posterior left upper lobe, measuring 4.4 x 1.8 cm (series 3, image 47). There is however new subpleural consolidation and bandlike consolidation of the peripheral left upper lobe and left apex (series 3, image 50, 37). Continued interval decrease in size of a nodule of the central right upper lobe, measuring 0.5 x 0.5 cm, previously 0.8 x 0.7 cm, with new adjacent, bandlike heterogeneous and ground-glass opacity. No pleural effusion or pneumothorax. Musculoskeletal: No chest wall mass or suspicious osseous lesions identified. CT ABDOMEN PELVIS FINDINGS Hepatobiliary: Multiple heterogeneously enhancing liver lesions are again seen, the majority clearly demonstrating peripheral nodular enhancement and consistent with benign hemangiomata (series 2, image 63, 55). No gallstones, gallbladder wall thickening, or biliary dilatation. Pancreas: Unremarkable. No pancreatic ductal dilatation or surrounding inflammatory changes. Spleen: Normal in size without significant abnormality. Adrenals/Urinary Tract: Adrenal glands are unremarkable. Kidneys are normal, without renal calculi, solid lesion, or hydronephrosis. Bladder is unremarkable. Stomach/Bowel: Stomach is within normal limits. Appendix appears normal. No evidence of bowel wall thickening, distention, or inflammatory changes. Vascular/Lymphatic: Scattered aortic atherosclerosis. No enlarged abdominal or pelvic lymph nodes. Reproductive: No mass or other abnormality. Other: No abdominal wall hernia or abnormality. No ascites. Musculoskeletal: No acute osseous findings. IMPRESSION: 1. Unchanged post treatment appearance of a mass of the posterior left upper lobe. 2. There is however new subpleural consolidation and bandlike consolidation of the peripheral left upper lobe and left apex, most consistent with evolving radiation pneumonitis/fibrosis given time course of recent radiation therapy. 3. Interval decrease in size of a nodule of the right upper lobe, with new  adjacent, bandlike airspace opacity, as above consistent with developing radiation pneumonitis/fibrosis. 4. Multiple unchanged subcentimeter mediastinal lymph nodes. Unchanged post treatment appearance of soft tissue about the left hilum. Attention on follow-up. 5. No evidence of metastatic disease in the abdomen or pelvis. 6. Multiple heterogeneously enhancing liver lesions are again seen, the majority clearly demonstrating peripheral nodular enhancement and consistent with benign hemangiomata. Attention on follow-up. 7. Emphysema and diffuse bilateral bronchial wall thickening. 8. Coronary artery disease. Aortic Atherosclerosis (ICD10-I70.0) and Emphysema (ICD10-J43.9). Electronically Signed   By: Delanna Ahmadi M.D.   On: 02/17/2022 20:01    Assessment/Plan:  A/P Doing well w/o complications D/w him about potentially removing other EIC to prevent infection He will call us back when he is ready   Greater than 50% of the 20 minutes  visit was spent in counseling/coordination of care   Caroleen Hamman, MD [place] Surgeon

## 2022-02-22 ENCOUNTER — Inpatient Hospital Stay: Payer: Medicaid Other | Attending: Oncology

## 2022-02-22 ENCOUNTER — Encounter: Payer: Self-pay | Admitting: Oncology

## 2022-02-22 ENCOUNTER — Inpatient Hospital Stay: Payer: Medicaid Other

## 2022-02-22 ENCOUNTER — Other Ambulatory Visit: Payer: Self-pay

## 2022-02-22 ENCOUNTER — Inpatient Hospital Stay (HOSPITAL_BASED_OUTPATIENT_CLINIC_OR_DEPARTMENT_OTHER): Payer: Medicaid Other | Admitting: Oncology

## 2022-02-22 VITALS — BP 100/68 | HR 81 | Temp 96.8°F | Resp 16 | Ht 74.0 in | Wt 187.5 lb

## 2022-02-22 DIAGNOSIS — K769 Liver disease, unspecified: Secondary | ICD-10-CM | POA: Diagnosis not present

## 2022-02-22 DIAGNOSIS — Z28310 Unvaccinated for covid-19: Secondary | ICD-10-CM | POA: Insufficient documentation

## 2022-02-22 DIAGNOSIS — Z808 Family history of malignant neoplasm of other organs or systems: Secondary | ICD-10-CM | POA: Insufficient documentation

## 2022-02-22 DIAGNOSIS — Z8249 Family history of ischemic heart disease and other diseases of the circulatory system: Secondary | ICD-10-CM | POA: Diagnosis not present

## 2022-02-22 DIAGNOSIS — Z801 Family history of malignant neoplasm of trachea, bronchus and lung: Secondary | ICD-10-CM | POA: Insufficient documentation

## 2022-02-22 DIAGNOSIS — Z833 Family history of diabetes mellitus: Secondary | ICD-10-CM | POA: Diagnosis not present

## 2022-02-22 DIAGNOSIS — C3412 Malignant neoplasm of upper lobe, left bronchus or lung: Secondary | ICD-10-CM | POA: Diagnosis present

## 2022-02-22 DIAGNOSIS — Z79899 Other long term (current) drug therapy: Secondary | ICD-10-CM | POA: Insufficient documentation

## 2022-02-22 DIAGNOSIS — I2510 Atherosclerotic heart disease of native coronary artery without angina pectoris: Secondary | ICD-10-CM | POA: Diagnosis not present

## 2022-02-22 DIAGNOSIS — C3490 Malignant neoplasm of unspecified part of unspecified bronchus or lung: Secondary | ICD-10-CM | POA: Diagnosis not present

## 2022-02-22 DIAGNOSIS — D1803 Hemangioma of intra-abdominal structures: Secondary | ICD-10-CM | POA: Insufficient documentation

## 2022-02-22 DIAGNOSIS — Z5112 Encounter for antineoplastic immunotherapy: Secondary | ICD-10-CM | POA: Insufficient documentation

## 2022-02-22 DIAGNOSIS — G893 Neoplasm related pain (acute) (chronic): Secondary | ICD-10-CM | POA: Insufficient documentation

## 2022-02-22 DIAGNOSIS — E063 Autoimmune thyroiditis: Secondary | ICD-10-CM

## 2022-02-22 DIAGNOSIS — F17210 Nicotine dependence, cigarettes, uncomplicated: Secondary | ICD-10-CM | POA: Diagnosis not present

## 2022-02-22 DIAGNOSIS — R5383 Other fatigue: Secondary | ICD-10-CM | POA: Diagnosis not present

## 2022-02-22 DIAGNOSIS — R590 Localized enlarged lymph nodes: Secondary | ICD-10-CM | POA: Insufficient documentation

## 2022-02-22 LAB — COMPREHENSIVE METABOLIC PANEL
ALT: 10 U/L (ref 0–44)
AST: 13 U/L — ABNORMAL LOW (ref 15–41)
Albumin: 3.8 g/dL (ref 3.5–5.0)
Alkaline Phosphatase: 55 U/L (ref 38–126)
Anion gap: 7 (ref 5–15)
BUN: 8 mg/dL (ref 6–20)
CO2: 27 mmol/L (ref 22–32)
Calcium: 9 mg/dL (ref 8.9–10.3)
Chloride: 98 mmol/L (ref 98–111)
Creatinine, Ser: 0.79 mg/dL (ref 0.61–1.24)
GFR, Estimated: >60 mL/min (ref >60)
Glucose, Bld: 116 mg/dL — ABNORMAL HIGH (ref 70–99)
Potassium: 4.4 mmol/L (ref 3.5–5.1)
Sodium: 132 mmol/L — ABNORMAL LOW (ref 135–145)
Total Bilirubin: 0.1 mg/dL — ABNORMAL LOW (ref 0.3–1.2)
Total Protein: 6.9 g/dL (ref 6.5–8.1)

## 2022-02-22 LAB — T4, FREE: Free T4: 1 ng/dL (ref 0.61–1.12)

## 2022-02-22 LAB — CBC WITH DIFFERENTIAL/PLATELET
Abs Immature Granulocytes: 0.04 10*3/uL (ref 0.00–0.07)
Basophils Absolute: 0.1 10*3/uL (ref 0.0–0.1)
Basophils Relative: 1 %
Eosinophils Absolute: 0.4 10*3/uL (ref 0.0–0.5)
Eosinophils Relative: 4 %
HCT: 37.8 % — ABNORMAL LOW (ref 39.0–52.0)
Hemoglobin: 12.5 g/dL — ABNORMAL LOW (ref 13.0–17.0)
Immature Granulocytes: 0 %
Lymphocytes Relative: 13 %
Lymphs Abs: 1.2 10*3/uL (ref 0.7–4.0)
MCH: 29.3 pg (ref 26.0–34.0)
MCHC: 33.1 g/dL (ref 30.0–36.0)
MCV: 88.5 fL (ref 80.0–100.0)
Monocytes Absolute: 0.9 10*3/uL (ref 0.1–1.0)
Monocytes Relative: 10 %
Neutro Abs: 6.5 10*3/uL (ref 1.7–7.7)
Neutrophils Relative %: 72 %
Platelets: 322 10*3/uL (ref 150–400)
RBC: 4.27 MIL/uL (ref 4.22–5.81)
RDW: 14.3 % (ref 11.5–15.5)
WBC: 9.1 10*3/uL (ref 4.0–10.5)
nRBC: 0 % (ref 0.0–0.2)

## 2022-02-22 LAB — TSH: TSH: 9.67 u[IU]/mL — ABNORMAL HIGH (ref 0.350–4.500)

## 2022-02-22 MED ORDER — SODIUM CHLORIDE 0.9 % IV SOLN
Freq: Once | INTRAVENOUS | Status: AC
  Filled 2022-02-22: qty 250

## 2022-02-22 MED ORDER — SODIUM CHLORIDE 0.9 % IV SOLN
1500.0000 mg | Freq: Once | INTRAVENOUS | Status: AC
  Administered 2022-02-22: 1500 mg via INTRAVENOUS
  Filled 2022-02-22: qty 30

## 2022-02-22 NOTE — Patient Instructions (Signed)
[facility] AT [place]  Discharge Instructions: Thank you for choosing [facility] to provide your oncology and hematology care.  If you have a lab appointment with the [facility], please go directly to the [facility] and check in at the registration area.  Wear comfortable clothing and clothing appropriate for easy access to any Portacath or PICC line.   We strive to give you quality time with your provider. You may need to reschedule your appointment if you arrive late (15 or more minutes).  Arriving late affects you and other patients whose appointments are after yours.  Also, if you miss three or more appointments without notifying the office, you may be dismissed from the clinic at the providers discretion.      For prescription refill requests, have your pharmacy contact our office and allow 72 hours for refills to be completed.    Today you received the following chemotherapy and/or immunotherapy agents Durvalumab      To help prevent nausea and vomiting after your treatment, we encourage you to take your nausea medication as directed.  BELOW ARE SYMPTOMS THAT SHOULD BE REPORTED IMMEDIATELY: *FEVER GREATER THAN 100.4 F (38 C) OR HIGHER *CHILLS OR SWEATING *NAUSEA AND VOMITING THAT IS NOT CONTROLLED WITH YOUR NAUSEA MEDICATION *UNUSUAL SHORTNESS OF BREATH *UNUSUAL BRUISING OR BLEEDING *URINARY PROBLEMS (pain or burning when urinating, or frequent urination) *BOWEL PROBLEMS (unusual diarrhea, constipation, pain near the anus) TENDERNESS IN MOUTH AND THROAT WITH OR WITHOUT PRESENCE OF ULCERS (sore throat, sores in mouth, or a toothache) UNUSUAL RASH, SWELLING OR PAIN  UNUSUAL VAGINAL DISCHARGE OR ITCHING   Items with * indicate a potential emergency and should be followed up as soon as possible or go to the Emergency Department if any problems should occur.  Please show the CHEMOTHERAPY ALERT CARD or IMMUNOTHERAPY ALERT CARD at check-in to  the Emergency Department and triage nurse.  Should you have questions after your visit or need to cancel or reschedule your appointment, please contact [facility] [place] AT [place]  [phone] and follow the prompts.  Office hours are 8:00 a.m. to 4:30 p.m. Monday - Friday. Please note that voicemails left after 4:00 p.m. may not be returned until the following business day.  We are closed weekends and major holidays. You have access to a nurse at all times for urgent questions. Please call the main number to the clinic [phone] and follow the prompts.  For any non-urgent questions, you may also contact your provider using MyChart. We now offer e-Visits for anyone 13 and older to request care online for non-urgent symptoms. For details visit [email or web address].   Also download the MyChart app! Go to the app store, search "MyChart", open the app, select [facility], and log in with your MyChart username and password.  Due to Covid, a mask is required upon entering the hospital/clinic. If you do not have a mask, one will be given to you upon arrival. For doctor visits, patients may have 1 support person aged 19 or older with them. For treatment visits, patients cannot have anyone with them due to current Covid guidelines and our immunocompromised population.

## 2022-02-22 NOTE — Progress Notes (Signed)
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  He had a chest x-ray followed by a CT scan which showed a 5.7 x 4.4 cm left upper lobe mass along with left hilar adenopathy.  There was a low-density 3.6 lesion noted in the right hepatic lobe which ultimately turned out to be a hemangioma on MRI liver.  PET CT scan showed hypermetabolic activity in the left upper lobe and left hilar nodal tissue.  Subcarinal and right paratracheal lymph nodes with mild hypermetabolic features.   Patient underwent bronchoscopies and biopsies.Left upper lobe biopsy was consistent with non-small cell carcinoma favor squamous cell carcinoma right subcarinal lymph node biopsy was negative for malignancy.   Plan is for concurrent chemoradiation with carbotaxol followed by maintenance durvalumab.  Patient does not want to get a port placed.  He is also refused Covid vaccination   NGS testing showed high tumor mutational burden.  PD-L1 90%.  MSI stable.  Patient PIK3CA and notch2 FCHO2 fusion, T p53   Scans in May 2022 Showed 2 hypermetabolic nodules in the left upper lobe and new hypermetabolic cavitary nodule in the right upper lobe concerning for disease progression.  No evidence of distant metastatic disease.  Patient was seen  by Dr. Donella Stade and received SBRT to the left lung with plans for continued monitoring of the right upper lobe lung nodule.  Patient received last Durvalumab on 05/07/2021.  Durvalumab was on hold during radiation treatment until 06/29/2021 but patient did not follow-up after that to resume treatment     Interval history-denies any significant chest wall pain and uses oxycodone sparingly.  Denies any significant cough, sputum production or fever.  Has baseline exertional shortness of breath which is overall stable  ECOG PS- 1 Pain scale- 0   Review of systems- Review of Systems  Constitutional:  Positive for malaise/fatigue. Negative for chills, fever and weight loss.  HENT:  Negative for congestion, ear discharge and nosebleeds.   Eyes:  Negative for blurred vision.  Respiratory:  Negative for cough, hemoptysis, sputum production, shortness of breath and wheezing.   Cardiovascular:  Negative for chest pain, palpitations, orthopnea and claudication.  Gastrointestinal:  Negative for abdominal pain, blood in stool, constipation, diarrhea, heartburn, melena, nausea and vomiting.  Genitourinary:  Negative for dysuria, flank pain, frequency, hematuria and urgency.  Musculoskeletal:  Negative for back pain, joint pain and myalgias.  Skin:  Negative for rash.  Neurological:  Negative for dizziness, tingling, focal weakness, seizures, weakness and headaches.  Endo/Heme/Allergies:  Does not bruise/bleed easily.  Psychiatric/Behavioral:  Negative for depression and suicidal ideas. The patient does not have insomnia.      No Known Allergies   Past Medical History:  Diagnosis Date   COPD (chronic obstructive pulmonary disease) ([place])    Emphysema of lung ([place])    Lung cancer ([place])  06/2020   Pneumonia      Past Surgical History:  Procedure Laterality Date   BACK SURGERY  2017   lumbar region herniated disc   VIDEO BRONCHOSCOPY WITH ENDOBRONCHIAL NAVIGATION N/A 08/07/2020   Procedure: VIDEO  BRONCHOSCOPY WITH ENDOBRONCHIAL NAVIGATION;  Surgeon: Tyler Pita, MD;  Location: [facility] ORS;  Service: Pulmonary;  Laterality: N/A;   VIDEO BRONCHOSCOPY WITH ENDOBRONCHIAL ULTRASOUND N/A 08/07/2020   Procedure: VIDEO BRONCHOSCOPY WITH ENDOBRONCHIAL ULTRASOUND;  Surgeon: Tyler Pita, MD;  Location: [facility] ORS;  Service: Pulmonary;  Laterality: N/A;    Social History   Socioeconomic History   Marital status: Divorced    Spouse name: Not on file   Number of children: Not on file   Years of education: Not on file   Highest education level: Not on file  Occupational History   Not on file  Tobacco Use   Smoking status: Every Day    Packs/day: 2.00    Years: 35.00    Pack years: 70.00    Types: Cigarettes   Smokeless tobacco: Former    Types: Chew   Tobacco comments:    2PPD 01/19/2021  Vaping Use   Vaping Use: Never used  Substance and Sexual Activity   Alcohol use: Yes    Alcohol/week: 4.0 standard drinks    Types: 4 Cans of beer per week   Drug use: No   Sexual activity: Yes  Other Topics Concern   Not on file  Social History Narrative   Not on file   Social Determinants of Health   Financial Resource Strain: Not on file  Food Insecurity: Not on file  Transportation Needs: Not on file  Physical Activity: Not on file  Stress: Not on file  Social Connections: Not on file  Intimate Partner Violence: Not on file    Family History  Problem Relation Age of Onset   Diabetes Father    Lung cancer Father 64   Heart attack Father    Throat cancer Maternal Aunt      Current Outpatient Medications:    acetaminophen (TYLENOL) 500 MG tablet, Take 500-1,000 mg by mouth every 6 (six) hours as needed (for pain.)., Disp: , Rfl:    budesonide-formoterol (SYMBICORT) 160-4.5 MCG/ACT inhaler, Inhale 2 puffs into the lungs in the morning and at bedtime., Disp: 1 each, Rfl: 12   levothyroxine (SYNTHROID) 137 MCG tablet, Take 1 tablet (137 mcg total) by mouth daily before  breakfast., Disp: 90 tablet, Rfl: 0   morphine (MS CONTIN) 15 MG 12 hr tablet, Take 1 tablet (15 mg total) by mouth every 12 (twelve) hours., Disp: 60 tablet, Rfl: 0   oxyCODONE (OXY IR/ROXICODONE) 5 MG immediate release tablet, Take 1 tablet (5 mg total) by mouth every 4 (four) hours as needed for severe pain., Disp: 60 tablet, Rfl: 0   VENTOLIN HFA 108 (90 Base) MCG/ACT inhaler, TAKE 2 PUFFS BY MOUTH EVERY 6 HOURS AS NEEDED FOR WHEEZE OR SHORTNESS OF BREATH, Disp: 18 each, Rfl: 2  Physical exam:  Vitals:   02/22/22 0949  BP: 100/68  Pulse: 81  Resp: 16  Temp: (!) 96.8 F (36 C)  SpO2: 100%  Weight: 187 lb 8 oz (85 kg)  Height: 6' 2"  (1.88 m)   Physical Exam Constitutional:      General: He is not in acute distress. Cardiovascular:     Rate and Rhythm: Normal rate and regular rhythm.     Heart sounds: Normal heart sounds.  Pulmonary:  Effort: Pulmonary effort is normal.     Breath sounds: Normal breath sounds.  Abdominal:     General: Bowel sounds are normal.     Palpations: Abdomen is soft.  Skin:    General: Skin is warm and dry.  Neurological:     Mental Status: He is alert and oriented to person, place, and time.     CMP Latest Ref Rng & Units 02/22/2022  Glucose 70 - 99 mg/dL 116(H)  BUN 6 - 20 mg/dL 8  Creatinine 0.61 - 1.24 mg/dL 0.79  Sodium 135 - 145 mmol/L 132(L)  Potassium 3.5 - 5.1 mmol/L 4.4  Chloride 98 - 111 mmol/L 98  CO2 22 - 32 mmol/L 27  Calcium 8.9 - 10.3 mg/dL 9.0  Total Protein 6.5 - 8.1 g/dL 6.9  Total Bilirubin 0.3 - 1.2 mg/dL 0.1(L)  Alkaline Phos 38 - 126 U/L 55  AST 15 - 41 U/L 13(L)  ALT 0 - 44 U/L 10   CBC Latest Ref Rng & Units 02/22/2022  WBC 4.0 - 10.5 K/uL 9.1  Hemoglobin 13.0 - 17.0 g/dL 12.5(L)  Hematocrit 39.0 - 52.0 % 37.8(L)  Platelets 150 - 400 K/uL 322    No images are attached to the encounter.  CT CHEST ABDOMEN PELVIS W CONTRAST  Result Date: 02/17/2022 CLINICAL DATA:  Left upper lobe lung cancer restaging,  ongoing chemotherapy and immunotherapy EXAM: CT CHEST, ABDOMEN, AND PELVIS WITH CONTRAST TECHNIQUE: Multidetector CT imaging of the chest, abdomen and pelvis was performed following the standard protocol during bolus administration of intravenous contrast. RADIATION DOSE REDUCTION: This exam was performed according to the departmental dose-optimization program which includes automated exposure control, adjustment of the mA and/or kV according to patient size and/or use of iterative reconstruction technique. CONTRAST:  122m OMNIPAQUE IOHEXOL 300 MG/ML SOLN, additional oral enteric contrast COMPARISON:  11/11/2021, 08/03/2021 FINDINGS: CT CHEST FINDINGS Cardiovascular: No significant vascular findings. Normal heart size. Scattered left coronary artery calcifications. No pericardial effusion. Mediastinum/Nodes: Multiple unchanged subcentimeter mediastinal lymph nodes (series 2, image 23). Unchanged post treatment appearance of soft tissue about the left hilum (series 2, image 27) thyroid gland, trachea, and esophagus demonstrate no significant findings. Lungs/Pleura: Moderate centrilobular emphysema. Diffuse bilateral bronchial wall thickening. Unchanged post treatment appearance of a mass of the posterior left upper lobe, measuring 4.4 x 1.8 cm (series 3, image 47). There is however new subpleural consolidation and bandlike consolidation of the peripheral left upper lobe and left apex (series 3, image 50, 37). Continued interval decrease in size of a nodule of the central right upper lobe, measuring 0.5 x 0.5 cm, previously 0.8 x 0.7 cm, with new adjacent, bandlike heterogeneous and ground-glass opacity. No pleural effusion or pneumothorax. Musculoskeletal: No chest wall mass or suspicious osseous lesions identified. CT ABDOMEN PELVIS FINDINGS Hepatobiliary: Multiple heterogeneously enhancing liver lesions are again seen, the majority clearly demonstrating peripheral nodular enhancement and consistent with benign  hemangiomata (series 2, image 63, 55). No gallstones, gallbladder wall thickening, or biliary dilatation. Pancreas: Unremarkable. No pancreatic ductal dilatation or surrounding inflammatory changes. Spleen: Normal in size without significant abnormality. Adrenals/Urinary Tract: Adrenal glands are unremarkable. Kidneys are normal, without renal calculi, solid lesion, or hydronephrosis. Bladder is unremarkable. Stomach/Bowel: Stomach is within normal limits. Appendix appears normal. No evidence of bowel wall thickening, distention, or inflammatory changes. Vascular/Lymphatic: Scattered aortic atherosclerosis. No enlarged abdominal or pelvic lymph nodes. Reproductive: No mass or other abnormality. Other: No abdominal wall hernia or abnormality. No ascites. Musculoskeletal: No acute osseous findings.  IMPRESSION: 1. Unchanged post treatment appearance of a mass of the posterior left upper lobe. 2. There is however new subpleural consolidation and bandlike consolidation of the peripheral left upper lobe and left apex, most consistent with evolving radiation pneumonitis/fibrosis given time course of recent radiation therapy. 3. Interval decrease in size of a nodule of the right upper lobe, with new adjacent, bandlike airspace opacity, as above consistent with developing radiation pneumonitis/fibrosis. 4. Multiple unchanged subcentimeter mediastinal lymph nodes. Unchanged post treatment appearance of soft tissue about the left hilum. Attention on follow-up. 5. No evidence of metastatic disease in the abdomen or pelvis. 6. Multiple heterogeneously enhancing liver lesions are again seen, the majority clearly demonstrating peripheral nodular enhancement and consistent with benign hemangiomata. Attention on follow-up. 7. Emphysema and diffuse bilateral bronchial wall thickening. 8. Coronary artery disease. Aortic Atherosclerosis (ICD10-I70.0) and Emphysema (ICD10-J43.9). Electronically Signed   By: Delanna Ahmadi M.D.   On:  02/17/2022 20:01     Assessment and plan- Patient is a 56 y.o. male with h/o stage III squamous cell carcinoma of the left upper lobe T3 N1 M0.  He is here for on treatment assessment prior to cycle 18 of maintenance durvalumab  Counts okay to proceed with cycle 18 of maintenance durvalumab today.  I will see him back in 4 weeks for cycle 19 which will be his last cycle.  Patient did get a gap of 3 months of no treatment when he was lost to follow-up.  He will therefore end his treatments in March 2023 to complete 1 year of treatment in total.  I have reviewed CT chest abdomen and pelvis images independently and discussed findings with the patient which shows overall no significant change in the appearance of mediastinal lymph nodes.  Both left upper lobe nodule and right lower lobe nodule appears stable in fact the right upper lobe nodule has decreased in size from 0.8 cm to 2.5 cm.  There is appearance of subpleural consolidation especially noted in the left upper lobe and left upper ex more concerning for postradiation pneumonitis/fibrosis which we will continue to monitor.  Multiple liver hemangiomata.  Overall no evidence of disease progression.  My plan is to stop durvalumab in 4 weeks and continue to monitor him with surveillance scans every 3 to 4 months .    Visit Diagnosis 1. Encounter for antineoplastic immunotherapy   2. Neoplasm related pain   3. Malignant neoplasm of unspecified part of unspecified bronchus or lung ([place])      Dr. Randa Evens, MD, MPH [facility] [phone] 02/22/2022 10:15 AM

## 2022-02-24 ENCOUNTER — Other Ambulatory Visit: Payer: Self-pay | Admitting: Nurse Practitioner

## 2022-02-24 DIAGNOSIS — G893 Neoplasm related pain (acute) (chronic): Secondary | ICD-10-CM

## 2022-02-27 ENCOUNTER — Encounter: Payer: Self-pay | Admitting: Oncology

## 2022-02-27 MED ORDER — OXYCODONE HCL 5 MG PO TABS: 5.0000 mg | ORAL_TABLET | ORAL | 0 refills | Status: DC | PRN

## 2022-03-21 ENCOUNTER — Telehealth: Payer: Self-pay | Admitting: *Deleted

## 2022-03-21 ENCOUNTER — Other Ambulatory Visit: Payer: Self-pay

## 2022-03-21 DIAGNOSIS — C3490 Malignant neoplasm of unspecified part of unspecified bronchus or lung: Secondary | ICD-10-CM

## 2022-03-21 NOTE — Telephone Encounter (Addendum)
Per pt's daughter, pt is not able to come in today, due to it taking pt so long to shower and get ready. Of note, pt is scheduled for labs/md/treatment tomorrow. Daughter informed that chest x-ray would be very helpful prior to appointments, if he is able to go get that done. Informed daughter that if patient is too sick to get out of bed, that it is highly recommended that he go to the ED. She stated that pt is very resistive to that, but she would call him and encourage him to go get chest x-ray this afternoon. Awaiting return call from daughter with update.  ?

## 2022-03-21 NOTE — Telephone Encounter (Signed)
Patient daughter [name] called reporting that patient has been sick for 2 weeks and has refused to call for care. She reports that he has had fever (Unknown how high), coughing, and excessive sweating where he has had to change shirts 4 times a day. He now has chest and back pain and thinks that he may have pneumonia. He doe snot want to go to ER and he has an appointment tomorrow, but she feels he needs to be cared for today. Please advise ?

## 2022-03-21 NOTE — Telephone Encounter (Signed)
One of you please see him today ?

## 2022-03-21 NOTE — Telephone Encounter (Signed)
Daughter called back and stated "he's refusing to do anything". Will keep appointments as scheduled. Reminded to utilize ED if pt condition worsens or unable to get out of bed.  ?

## 2022-03-22 ENCOUNTER — Inpatient Hospital Stay: Payer: Medicaid Other | Attending: Nurse Practitioner | Admitting: Oncology

## 2022-03-22 ENCOUNTER — Ambulatory Visit
Admission: RE | Admit: 2022-03-22 | Discharge: 2022-03-22 | Disposition: A | Discharge status: Home / Self Care | Payer: Medicaid Other | Source: Ambulatory Visit | Attending: Hospice and Palliative Medicine | Admitting: Hospice and Palliative Medicine

## 2022-03-22 ENCOUNTER — Other Ambulatory Visit: Payer: Self-pay

## 2022-03-22 ENCOUNTER — Other Ambulatory Visit: Payer: Self-pay | Admitting: *Deleted

## 2022-03-22 ENCOUNTER — Inpatient Hospital Stay: Payer: Medicaid Other

## 2022-03-22 ENCOUNTER — Ambulatory Visit
Admission: RE | Admit: 2022-03-22 | Discharge: 2022-03-22 | Disposition: A | Discharge status: Home / Self Care | Payer: Medicaid Other | Attending: Hospice and Palliative Medicine | Admitting: Hospice and Palliative Medicine

## 2022-03-22 ENCOUNTER — Encounter: Payer: Self-pay | Admitting: Oncology

## 2022-03-22 VITALS — BP 104/70 | HR 96 | Temp 97.3°F | Resp 16 | Ht 74.0 in | Wt 182.6 lb

## 2022-03-22 DIAGNOSIS — Z833 Family history of diabetes mellitus: Secondary | ICD-10-CM | POA: Insufficient documentation

## 2022-03-22 DIAGNOSIS — R059 Cough, unspecified: Secondary | ICD-10-CM | POA: Diagnosis not present

## 2022-03-22 DIAGNOSIS — C3412 Malignant neoplasm of upper lobe, left bronchus or lung: Secondary | ICD-10-CM | POA: Diagnosis present

## 2022-03-22 DIAGNOSIS — Z5112 Encounter for antineoplastic immunotherapy: Secondary | ICD-10-CM | POA: Diagnosis present

## 2022-03-22 DIAGNOSIS — Z801 Family history of malignant neoplasm of trachea, bronchus and lung: Secondary | ICD-10-CM | POA: Insufficient documentation

## 2022-03-22 DIAGNOSIS — D1803 Hemangioma of intra-abdominal structures: Secondary | ICD-10-CM | POA: Insufficient documentation

## 2022-03-22 DIAGNOSIS — J189 Pneumonia, unspecified organism: Secondary | ICD-10-CM

## 2022-03-22 DIAGNOSIS — C3490 Malignant neoplasm of unspecified part of unspecified bronchus or lung: Secondary | ICD-10-CM

## 2022-03-22 DIAGNOSIS — G893 Neoplasm related pain (acute) (chronic): Secondary | ICD-10-CM | POA: Insufficient documentation

## 2022-03-22 DIAGNOSIS — F17210 Nicotine dependence, cigarettes, uncomplicated: Secondary | ICD-10-CM | POA: Insufficient documentation

## 2022-03-22 DIAGNOSIS — Z79899 Other long term (current) drug therapy: Secondary | ICD-10-CM | POA: Insufficient documentation

## 2022-03-22 DIAGNOSIS — R0789 Other chest pain: Secondary | ICD-10-CM | POA: Diagnosis not present

## 2022-03-22 DIAGNOSIS — R509 Fever, unspecified: Secondary | ICD-10-CM | POA: Diagnosis not present

## 2022-03-22 DIAGNOSIS — J439 Emphysema, unspecified: Secondary | ICD-10-CM | POA: Diagnosis not present

## 2022-03-22 DIAGNOSIS — Z28310 Unvaccinated for covid-19: Secondary | ICD-10-CM | POA: Insufficient documentation

## 2022-03-22 DIAGNOSIS — Z8249 Family history of ischemic heart disease and other diseases of the circulatory system: Secondary | ICD-10-CM | POA: Insufficient documentation

## 2022-03-22 DIAGNOSIS — Z808 Family history of malignant neoplasm of other organs or systems: Secondary | ICD-10-CM | POA: Diagnosis not present

## 2022-03-22 LAB — CBC WITH DIFFERENTIAL/PLATELET
Abs Immature Granulocytes: 0.35 10*3/uL — ABNORMAL HIGH (ref 0.00–0.07)
Basophils Absolute: 0.1 10*3/uL (ref 0.0–0.1)
Basophils Relative: 1 %
Eosinophils Absolute: 0.1 10*3/uL (ref 0.0–0.5)
Eosinophils Relative: 1 %
HCT: 34.3 % — ABNORMAL LOW (ref 39.0–52.0)
Hemoglobin: 11.4 g/dL — ABNORMAL LOW (ref 13.0–17.0)
Immature Granulocytes: 2 %
Lymphocytes Relative: 6 %
Lymphs Abs: 1 10*3/uL (ref 0.7–4.0)
MCH: 29 pg (ref 26.0–34.0)
MCHC: 33.2 g/dL (ref 30.0–36.0)
MCV: 87.3 fL (ref 80.0–100.0)
Monocytes Absolute: 1.5 10*3/uL — ABNORMAL HIGH (ref 0.1–1.0)
Monocytes Relative: 9 %
Neutro Abs: 14 10*3/uL — ABNORMAL HIGH (ref 1.7–7.7)
Neutrophils Relative %: 81 %
Platelets: 752 10*3/uL — ABNORMAL HIGH (ref 150–400)
RBC: 3.93 MIL/uL — ABNORMAL LOW (ref 4.22–5.81)
RDW: 13.7 % (ref 11.5–15.5)
WBC: 17 10*3/uL — ABNORMAL HIGH (ref 4.0–10.5)
nRBC: 0 % (ref 0.0–0.2)

## 2022-03-22 LAB — COMPREHENSIVE METABOLIC PANEL
ALT: 16 U/L (ref 0–44)
AST: 19 U/L (ref 15–41)
Albumin: 3 g/dL — ABNORMAL LOW (ref 3.5–5.0)
Alkaline Phosphatase: 92 U/L (ref 38–126)
Anion gap: 10 (ref 5–15)
BUN: 8 mg/dL (ref 6–20)
CO2: 27 mmol/L (ref 22–32)
Calcium: 8.8 mg/dL — ABNORMAL LOW (ref 8.9–10.3)
Chloride: 88 mmol/L — ABNORMAL LOW (ref 98–111)
Creatinine, Ser: 0.85 mg/dL (ref 0.61–1.24)
GFR, Estimated: >60 mL/min (ref >60)
Glucose, Bld: 145 mg/dL — ABNORMAL HIGH (ref 70–99)
Potassium: 4.4 mmol/L (ref 3.5–5.1)
Sodium: 125 mmol/L — ABNORMAL LOW (ref 135–145)
Total Bilirubin: 0.2 mg/dL — ABNORMAL LOW (ref 0.3–1.2)
Total Protein: 7.6 g/dL (ref 6.5–8.1)

## 2022-03-22 MED ORDER — PROCHLORPERAZINE MALEATE 10 MG PO TABS: 10.0000 mg | ORAL_TABLET | Freq: Four times a day (QID) | ORAL | 1 refills | Status: DC | PRN

## 2022-03-22 MED ORDER — SODIUM CHLORIDE 0.9 % IV SOLN
Freq: Once | INTRAVENOUS | Status: AC
  Filled 2022-03-22: qty 250

## 2022-03-22 MED ORDER — LEVOFLOXACIN 500 MG PO TABS: 500.0000 mg | ORAL_TABLET | Freq: Every day | ORAL | 0 refills | Status: DC

## 2022-03-22 MED ORDER — PROCHLORPERAZINE EDISYLATE 10 MG/2ML IJ SOLN
10.0000 mg | Freq: Once | INTRAMUSCULAR | Status: AC
  Administered 2022-03-22: 10 mg via INTRAVENOUS
  Filled 2022-03-22: qty 2

## 2022-03-22 MED ORDER — SODIUM CHLORIDE 0.9 % IV SOLN
10.0000 mg | Freq: Once | INTRAVENOUS | Status: AC
  Administered 2022-03-22: 10 mg via INTRAVENOUS
  Filled 2022-03-22: qty 10

## 2022-03-22 MED ORDER — MORPHINE SULFATE (PF) 2 MG/ML IV SOLN
4.0000 mg | Freq: Once | INTRAVENOUS | Status: AC
  Administered 2022-03-22: 4 mg via INTRAVENOUS
  Filled 2022-03-22: qty 2

## 2022-03-22 NOTE — Progress Notes (Signed)
? ? ? ?Hematology/Oncology Consult note ?[place]  ?[phone] [phone] ? ?Patient Care Team: ?Patient, No Pcp Per (Inactive) as PCP - General (General Practice) ?Telford Nab, RN as Sales executive ?Sindy Guadeloupe, MD as Consulting Physician (Hematology and Oncology)  ? ?Name of the patient: Wesley Ferguson  ?[id number]  ?[birth year]  ? ?Date of visit: 03/22/22 ? 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 ?Chief complaint/ Reason for visit-on treatment assessment prior to cycle 19 of maintenance durvalumab ? ?Heme/Onc history: Patient is a 56 year old male who was admitted to the hospital in July 2021 with symptoms of chest pain and streaky hemoptysis.  He had a chest x-ray followed by a CT scan which showed a 5.7 x 4.4 cm left upper lobe mass along with left hilar adenopathy.  There was a low-density 3.6 lesion noted in the right hepatic lobe which ultimately turned out to be a hemangioma on MRI liver.  PET CT scan showed hypermetabolic activity in the left upper lobe and left hilar nodal tissue.  Subcarinal and right paratracheal lymph nodes with mild hypermetabolic features. ?  ?Patient underwent bronchoscopies and biopsies.Left upper lobe biopsy was consistent with non-small cell carcinoma favor squamous cell carcinoma right subcarinal lymph node biopsy was negative for malignancy. ?  ?Plan is for concurrent chemoradiation with carbotaxol followed by maintenance durvalumab.  Patient does not want to get a port placed.  He is also refused Covid vaccination ?  ?NGS testing showed high tumor mutational burden.  PD-L1 90%.  MSI stable.  Patient PIK3CA and notch2 FCHO2 fusion, T p53 ?  ?Scans in May 2022 Showed 2 hypermetabolic nodules in the left upper lobe and new hypermetabolic cavitary nodule in the right upper lobe concerning for disease progression.  No evidence of distant metastatic disease.  Patient was  seen by Dr. Donella Stade and received SBRT to the left lung with plans for continued monitoring of the right upper lobe lung nodule.  Patient received last Durvalumab on 05/07/2021.  Durvalumab was on hold during radiation treatment until 06/29/2021 but patient did not follow-up after that to resume treatment ?  ? Interval history-patient reports doing poorly over the last 4 to 5 days.  Reports that he has had a fever of 101 at times.  Reports ongoing left chest wall pain.  He has not been able to bring up any phlegm. ? ?ECOG PS- 1 ?Pain scale- 4 ? ? ?Review of systems- Review of Systems  ?Constitutional:  Positive for fever and malaise/fatigue. Negative for chills and weight loss.  ?HENT:  Negative for congestion, ear discharge and nosebleeds.   ?Eyes:  Negative for blurred vision.  ?Respiratory:  Negative for cough, hemoptysis, sputum production, shortness of breath and wheezing.   ?     Left chest wall pain  ?Cardiovascular:  Negative for chest pain, palpitations, orthopnea and claudication.  ?Gastrointestinal:  Negative for abdominal pain, blood in stool, constipation, diarrhea, heartburn, melena, nausea and vomiting.  ?Genitourinary:  Negative for dysuria, flank pain, frequency, hematuria and urgency.  ?Musculoskeletal:  Negative for back pain, joint pain and myalgias.  ?Skin:  Negative for rash.  ?Neurological:  Negative for dizziness, tingling, focal weakness, seizures, weakness and headaches.  ?Endo/Heme/Allergies:  Does not bruise/bleed easily.  ?Psychiatric/Behavioral:  Negative for depression and suicidal ideas. The patient does not have insomnia.    ? ? ? ?No Known Allergies ? ? ?Past Medical History:  ?  Diagnosis Date  ? COPD (chronic obstructive pulmonary disease) ([place])   ? Emphysema of lung ([place])   ? Lung cancer ([place]) 06/2020  ? Pneumonia   ? ? ? ?Past Surgical History:  ?Procedure Laterality Date  ? BACK SURGERY  2017  ? lumbar region herniated disc  ? VIDEO BRONCHOSCOPY WITH ENDOBRONCHIAL NAVIGATION N/A  08/07/2020  ? Procedure: VIDEO BRONCHOSCOPY WITH ENDOBRONCHIAL NAVIGATION;  Surgeon: Tyler Pita, MD;  Location: [facility] ORS;  Service: Pulmonary;  Laterality: N/A;  ? VIDEO BRONCHOSCOPY WITH ENDOBRONCHIAL ULTRASOUND N/A 08/07/2020  ? Procedure: VIDEO BRONCHOSCOPY WITH ENDOBRONCHIAL ULTRASOUND;  Surgeon: Tyler Pita, MD;  Location: [facility] ORS;  Service: Pulmonary;  Laterality: N/A;  ? ? ?Social History  ? ?Socioeconomic History  ? Marital status: Divorced  ?  Spouse name: Not on file  ? Number of children: Not on file  ? Years of education: Not on file  ? Highest education level: Not on file  ?Occupational History  ? Not on file  ?Tobacco Use  ? Smoking status: Every Day  ?  Packs/day: 2.00  ?  Years: 35.00  ?  Pack years: 70.00  ?  Types: Cigarettes  ? Smokeless tobacco: Former  ?  Types: Chew  ? Tobacco comments:  ?  2PPD 01/19/2021  ?Vaping Use  ? Vaping Use: Never used  ?Substance and Sexual Activity  ? Alcohol use: Not Currently  ?  Comment: none in 3 weeks  ? Drug use: No  ? Sexual activity: Not Currently  ?Other Topics Concern  ? Not on file  ?Social History Narrative  ? Not on file  ? ?Social Determinants of Health  ? ?Financial Resource Strain: Not on file  ?Food Insecurity: Not on file  ?Transportation Needs: Not on file  ?Physical Activity: Not on file  ?Stress: Not on file  ?Social Connections: Not on file  ?Intimate Partner Violence: Not on file  ? ? ?Family History  ?Problem Relation Age of Onset  ? Diabetes Father   ? Lung cancer Father 90  ? Heart attack Father   ? Throat cancer Maternal Aunt   ? ? ? ?Current Outpatient Medications:  ?  acetaminophen (TYLENOL) 500 MG tablet, Take 500-1,000 mg by mouth every 6 (six) hours as needed (for pain.)., Disp: , Rfl:  ?  budesonide-formoterol (SYMBICORT) 160-4.5 MCG/ACT inhaler, Inhale 2 puffs into the lungs in the morning and at bedtime., Disp: 1 each, Rfl: 12 ?  levothyroxine (SYNTHROID) 137 MCG tablet, Take 1 tablet (137 mcg total) by mouth daily  before breakfast., Disp: 90 tablet, Rfl: 0 ?  morphine (MS CONTIN) 15 MG 12 hr tablet, Take 1 tablet (15 mg total) by mouth every 12 (twelve) hours., Disp: 60 tablet, Rfl: 0 ?  oxyCODONE (OXY IR/ROXICODONE) 5 MG immediate release tablet, Take 1 tablet (5 mg total) by mouth every 4 (four) hours as needed for severe pain., Disp: 60 tablet, Rfl: 0 ?  VENTOLIN HFA 108 (90 Base) MCG/ACT inhaler, TAKE 2 PUFFS BY MOUTH EVERY 6 HOURS AS NEEDED FOR WHEEZE OR SHORTNESS OF BREATH, Disp: 18 each, Rfl: 2 ?  levofloxacin (LEVAQUIN) 500 MG tablet, Take 1 tablet (500 mg total) by mouth daily., Disp: 7 tablet, Rfl: 0 ?  prochlorperazine (COMPAZINE) 10 MG tablet, Take 1 tablet (10 mg total) by mouth every 6 (six) hours as needed for nausea or vomiting., Disp: 30 tablet, Rfl: 1 ? ?Physical exam:  ?Vitals:  ? 03/22/22 1001  ?BP: 104/70  ?Pulse: 96  ?Resp:  16  ?Temp: (!) 97.3 ?F (36.3 ?C)  ?TempSrc: Tympanic  ?SpO2: 97%  ?Weight: 182 lb 9.6 oz (82.8 kg)  ?Height: 6' 2" (1.88 m)  ? ?Physical Exam ?Constitutional:   ?   General: He is not in acute distress. ?Cardiovascular:  ?   Rate and Rhythm: Normal rate and regular rhythm.  ?   Heart sounds: Normal heart sounds.  ?Pulmonary:  ?   Effort: Pulmonary effort is normal.  ?   Comments: Breath sounds decreased over left side as compared to right ?Abdominal:  ?   General: Bowel sounds are normal.  ?   Palpations: Abdomen is soft.  ?Skin: ?   General: Skin is warm and dry.  ?Neurological:  ?   Mental Status: He is alert and oriented to person, place, and time.  ?  ? ? ?  Latest Ref Rng & Units 03/22/2022  ?  8:48 AM  ?CMP  ?Glucose 70 - 99 mg/dL 145    ?BUN 6 - 20 mg/dL 8    ?Creatinine 0.61 - 1.24 mg/dL 0.85    ?Sodium 135 - 145 mmol/L 125    ?Potassium 3.5 - 5.1 mmol/L 4.4    ?Chloride 98 - 111 mmol/L 88    ?CO2 22 - 32 mmol/L 27    ?Calcium 8.9 - 10.3 mg/dL 8.8    ?Total Protein 6.5 - 8.1 g/dL 7.6    ?Total Bilirubin 0.3 - 1.2 mg/dL 0.2    ?Alkaline Phos 38 - 126 U/L 92    ?AST 15 - 41 U/L  19    ?ALT 0 - 44 U/L 16    ? ? ?  Latest Ref Rng & Units 03/22/2022  ?  8:48 AM  ?CBC  ?WBC 4.0 - 10.5 K/uL 17.0    ?Hemoglobin 13.0 - 17.0 g/dL 11.4    ?Hematocrit 39.0 - 52.0 % 34.3    ?Platelets 150 - 400 K/uL

## 2022-03-22 NOTE — Patient Instructions (Signed)
Start taking Senna S and Miralax per Dr. Elroy Channel instructions to help with constipation. ?

## 2022-03-24 ENCOUNTER — Other Ambulatory Visit: Payer: Self-pay | Admitting: Oncology

## 2022-03-24 DIAGNOSIS — G893 Neoplasm related pain (acute) (chronic): Secondary | ICD-10-CM

## 2022-03-24 MED ORDER — OXYCODONE HCL 5 MG PO TABS: 5.0000 mg | ORAL_TABLET | ORAL | 0 refills | Status: DC | PRN

## 2022-03-26 ENCOUNTER — Other Ambulatory Visit: Payer: Self-pay | Admitting: Nurse Practitioner

## 2022-04-05 ENCOUNTER — Other Ambulatory Visit: Payer: Self-pay | Admitting: Oncology

## 2022-04-05 ENCOUNTER — Inpatient Hospital Stay: Payer: Medicaid Other | Admitting: Medical Oncology

## 2022-04-05 ENCOUNTER — Inpatient Hospital Stay: Payer: Medicaid Other

## 2022-04-05 DIAGNOSIS — Z03818 Encounter for observation for suspected exposure to other biological agents ruled out: Secondary | ICD-10-CM | POA: Diagnosis not present

## 2022-04-05 DIAGNOSIS — G893 Neoplasm related pain (acute) (chronic): Secondary | ICD-10-CM

## 2022-04-05 DIAGNOSIS — Z20822 Contact with and (suspected) exposure to covid-19: Secondary | ICD-10-CM | POA: Diagnosis not present

## 2022-04-06 ENCOUNTER — Encounter: Payer: Self-pay | Admitting: Oncology

## 2022-04-06 MED ORDER — OXYCODONE HCL 5 MG PO TABS: 5.0000 mg | ORAL_TABLET | ORAL | 0 refills | Status: DC | PRN

## 2022-04-11 ENCOUNTER — Inpatient Hospital Stay (HOSPITAL_BASED_OUTPATIENT_CLINIC_OR_DEPARTMENT_OTHER): Payer: Medicaid Other | Admitting: Medical Oncology

## 2022-04-11 ENCOUNTER — Inpatient Hospital Stay: Payer: Medicaid Other

## 2022-04-11 ENCOUNTER — Inpatient Hospital Stay: Payer: Medicaid Other | Attending: Nurse Practitioner

## 2022-04-11 VITALS — BP 103/72 | HR 77 | Temp 97.4°F | Resp 17 | Wt 181.0 lb

## 2022-04-11 DIAGNOSIS — J189 Pneumonia, unspecified organism: Secondary | ICD-10-CM

## 2022-04-11 DIAGNOSIS — Z801 Family history of malignant neoplasm of trachea, bronchus and lung: Secondary | ICD-10-CM | POA: Insufficient documentation

## 2022-04-11 DIAGNOSIS — J439 Emphysema, unspecified: Secondary | ICD-10-CM | POA: Diagnosis not present

## 2022-04-11 DIAGNOSIS — D1803 Hemangioma of intra-abdominal structures: Secondary | ICD-10-CM | POA: Insufficient documentation

## 2022-04-11 DIAGNOSIS — R590 Localized enlarged lymph nodes: Secondary | ICD-10-CM | POA: Insufficient documentation

## 2022-04-11 DIAGNOSIS — Z79899 Other long term (current) drug therapy: Secondary | ICD-10-CM | POA: Insufficient documentation

## 2022-04-11 DIAGNOSIS — E871 Hypo-osmolality and hyponatremia: Secondary | ICD-10-CM | POA: Insufficient documentation

## 2022-04-11 DIAGNOSIS — R0789 Other chest pain: Secondary | ICD-10-CM | POA: Insufficient documentation

## 2022-04-11 DIAGNOSIS — Z808 Family history of malignant neoplasm of other organs or systems: Secondary | ICD-10-CM | POA: Diagnosis not present

## 2022-04-11 DIAGNOSIS — C3412 Malignant neoplasm of upper lobe, left bronchus or lung: Secondary | ICD-10-CM | POA: Diagnosis not present

## 2022-04-11 DIAGNOSIS — F17210 Nicotine dependence, cigarettes, uncomplicated: Secondary | ICD-10-CM | POA: Insufficient documentation

## 2022-04-11 DIAGNOSIS — Z833 Family history of diabetes mellitus: Secondary | ICD-10-CM | POA: Insufficient documentation

## 2022-04-11 DIAGNOSIS — R5383 Other fatigue: Secondary | ICD-10-CM | POA: Insufficient documentation

## 2022-04-11 DIAGNOSIS — Z8249 Family history of ischemic heart disease and other diseases of the circulatory system: Secondary | ICD-10-CM | POA: Diagnosis not present

## 2022-04-11 DIAGNOSIS — C3490 Malignant neoplasm of unspecified part of unspecified bronchus or lung: Secondary | ICD-10-CM

## 2022-04-11 DIAGNOSIS — G893 Neoplasm related pain (acute) (chronic): Secondary | ICD-10-CM | POA: Diagnosis not present

## 2022-04-11 DIAGNOSIS — R61 Generalized hyperhidrosis: Secondary | ICD-10-CM | POA: Insufficient documentation

## 2022-04-11 DIAGNOSIS — Z5112 Encounter for antineoplastic immunotherapy: Secondary | ICD-10-CM | POA: Diagnosis present

## 2022-04-11 DIAGNOSIS — Z28310 Unvaccinated for covid-19: Secondary | ICD-10-CM | POA: Diagnosis not present

## 2022-04-11 LAB — CBC WITH DIFFERENTIAL/PLATELET
Abs Immature Granulocytes: 0.07 10*3/uL (ref 0.00–0.07)
Basophils Absolute: 0.1 10*3/uL (ref 0.0–0.1)
Basophils Relative: 1 %
Eosinophils Absolute: 0.5 10*3/uL (ref 0.0–0.5)
Eosinophils Relative: 4 %
HCT: 33.1 % — ABNORMAL LOW (ref 39.0–52.0)
Hemoglobin: 10.7 g/dL — ABNORMAL LOW (ref 13.0–17.0)
Immature Granulocytes: 1 %
Lymphocytes Relative: 12 %
Lymphs Abs: 1.4 10*3/uL (ref 0.7–4.0)
MCH: 27.7 pg (ref 26.0–34.0)
MCHC: 32.3 g/dL (ref 30.0–36.0)
MCV: 85.8 fL (ref 80.0–100.0)
Monocytes Absolute: 0.9 10*3/uL (ref 0.1–1.0)
Monocytes Relative: 7 %
Neutro Abs: 9.2 10*3/uL — ABNORMAL HIGH (ref 1.7–7.7)
Neutrophils Relative %: 75 %
Platelets: 520 10*3/uL — ABNORMAL HIGH (ref 150–400)
RBC: 3.86 MIL/uL — ABNORMAL LOW (ref 4.22–5.81)
RDW: 14.4 % (ref 11.5–15.5)
WBC: 12.2 10*3/uL — ABNORMAL HIGH (ref 4.0–10.5)
nRBC: 0 % (ref 0.0–0.2)

## 2022-04-11 LAB — BASIC METABOLIC PANEL
Anion gap: 7 (ref 5–15)
BUN: 8 mg/dL (ref 6–20)
CO2: 26 mmol/L (ref 22–32)
Calcium: 8.5 mg/dL — ABNORMAL LOW (ref 8.9–10.3)
Chloride: 95 mmol/L — ABNORMAL LOW (ref 98–111)
Creatinine, Ser: 0.73 mg/dL (ref 0.61–1.24)
GFR, Estimated: >60 mL/min (ref >60)
Glucose, Bld: 91 mg/dL (ref 70–99)
Potassium: 4.5 mmol/L (ref 3.5–5.1)
Sodium: 128 mmol/L — ABNORMAL LOW (ref 135–145)

## 2022-04-11 MED ORDER — SODIUM CHLORIDE 0.9 % IV SOLN
Freq: Once | INTRAVENOUS | Status: AC
  Filled 2022-04-11: qty 250

## 2022-04-11 NOTE — Patient Instructions (Signed)

## 2022-04-11 NOTE — Progress Notes (Signed)
Patient tolerated 1 L IVF infusion well today, no concerns voiced. Patient stable at discharge. AVS given.   ?

## 2022-04-11 NOTE — Progress Notes (Signed)
Returns for follow-up. Concerned that he still may have pneumonia. Complains of left side thoracic and back pain and productive cough with clear phlegm.  ?

## 2022-04-11 NOTE — Progress Notes (Signed)
? ? ? ?Hematology/Oncology Consult note ?[place]  ?[phone] [phone] ? ?Patient Care Team: ?Patient, No Pcp Per (Inactive) as PCP - General (General Practice) ?Telford Nab, RN as Sales executive ?Sindy Guadeloupe, MD as Consulting Physician (Hematology and Oncology)  ? ?Name of the patient: Wesley Ferguson  ?[id number]  ?1966-08-03  ? ?Date of visit: 04/11/22 ? 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 ?Chief complaint/ Reason for visit-on treatment assessment prior to cycle 19 of maintenance durvalumab ? ?Heme/Onc history: Patient is a 56 year old male who was admitted to the hospital in July 2021 with symptoms of chest pain and streaky hemoptysis.  He had a chest x-ray followed by a CT scan which showed a 5.7 x 4.4 cm left upper lobe mass along with left hilar adenopathy.  There was a low-density 3.6 lesion noted in the right hepatic lobe which ultimately turned out to be a hemangioma on MRI liver.  PET CT scan showed hypermetabolic activity in the left upper lobe and left hilar nodal tissue.  Subcarinal and right paratracheal lymph nodes with mild hypermetabolic features. ?  ?Patient underwent bronchoscopies and biopsies.Left upper lobe biopsy was consistent with non-small cell carcinoma favor squamous cell carcinoma right subcarinal lymph node biopsy was negative for malignancy. ?  ?Plan is for concurrent chemoradiation with carbotaxol followed by maintenance durvalumab.  Patient does not want to get a port placed.  He is also refused Covid vaccination ?  ?NGS testing showed high tumor mutational burden.  PD-L1 90%.  MSI stable.  Patient PIK3CA and notch2 FCHO2 fusion, T p53 ?  ?Scans in May 2022 Showed 2 hypermetabolic nodules in the left upper lobe and new hypermetabolic cavitary nodule in the right upper lobe concerning for disease progression.  No evidence of distant metastatic disease.  Patient was  seen by Dr. Donella Stade and received SBRT to the left lung with plans for continued monitoring of the right upper lobe lung nodule.  Patient received last Durvalumab on 05/07/2021.  Durvalumab was on hold during radiation treatment until 06/29/2021 but patient did not follow-up after that to resume treatment ?  ? Interval history- ?Patient returns for follow up suspected pneumonia. He was treated on 03/22/2022 with a 7 day course of Levaquin and IVF for his hyponatremia. He states that overall he is feeling better but not fully improved. He reports feeling 10/10 when he saw Dr. Janese Banks 2 weeks ago, 3/10 when he finished his ABX and 5/10 today. Having some chronic night sweats, cough, sob, chest wall pain. No new symptoms and no worsening SOB. No hemoptysis.  ? ? ? ?Review of systems- Review of Systems  ?Constitutional:  Positive for malaise/fatigue. Negative for chills, fever and weight loss.  ?HENT:  Negative for congestion, ear discharge and nosebleeds.   ?Eyes:  Negative for blurred vision.  ?Respiratory:  Positive for cough. Negative for hemoptysis, sputum production, shortness of breath and wheezing.   ?     Left chest wall pain  ?Cardiovascular:  Negative for chest pain, palpitations, orthopnea and claudication.  ?Gastrointestinal:  Negative for abdominal pain, blood in stool, constipation, diarrhea, heartburn, melena, nausea and vomiting.  ?Genitourinary:  Negative for dysuria, flank pain, frequency, hematuria and urgency.  ?Musculoskeletal:  Negative for back pain, joint pain and myalgias.  ?Skin:  Negative for rash.  ?Neurological:  Negative for dizziness, tingling, focal weakness, seizures, weakness and headaches.  ?Endo/Heme/Allergies:  Does not  bruise/bleed easily.  ?Psychiatric/Behavioral:  Negative for depression and suicidal ideas. The patient does not have insomnia.    ? ? ? ?No Known Allergies ? ? ?Past Medical History:  ?Diagnosis Date  ? COPD (chronic obstructive pulmonary disease) ([place])   ? Emphysema of  lung ([place])   ? Lung cancer ([place]) 06/2020  ? Pneumonia   ? ? ? ?Past Surgical History:  ?Procedure Laterality Date  ? BACK SURGERY  2017  ? lumbar region herniated disc  ? VIDEO BRONCHOSCOPY WITH ENDOBRONCHIAL NAVIGATION N/A 08/07/2020  ? Procedure: VIDEO BRONCHOSCOPY WITH ENDOBRONCHIAL NAVIGATION;  Surgeon: Tyler Pita, MD;  Location: [facility] ORS;  Service: Pulmonary;  Laterality: N/A;  ? VIDEO BRONCHOSCOPY WITH ENDOBRONCHIAL ULTRASOUND N/A 08/07/2020  ? Procedure: VIDEO BRONCHOSCOPY WITH ENDOBRONCHIAL ULTRASOUND;  Surgeon: Tyler Pita, MD;  Location: [facility] ORS;  Service: Pulmonary;  Laterality: N/A;  ? ? ?Social History  ? ?Socioeconomic History  ? Marital status: Divorced  ?  Spouse name: Not on file  ? Number of children: Not on file  ? Years of education: Not on file  ? Highest education level: Not on file  ?Occupational History  ? Not on file  ?Tobacco Use  ? Smoking status: Every Day  ?  Packs/day: 2.00  ?  Years: 35.00  ?  Pack years: 70.00  ?  Types: Cigarettes  ? Smokeless tobacco: Former  ?  Types: Chew  ? Tobacco comments:  ?  2PPD 01/19/2021  ?Vaping Use  ? Vaping Use: Never used  ?Substance and Sexual Activity  ? Alcohol use: Not Currently  ?  Comment: none in 3 weeks  ? Drug use: No  ? Sexual activity: Not Currently  ?Other Topics Concern  ? Not on file  ?Social History Narrative  ? Not on file  ? ?Social Determinants of Health  ? ?Financial Resource Strain: Not on file  ?Food Insecurity: Not on file  ?Transportation Needs: Not on file  ?Physical Activity: Not on file  ?Stress: Not on file  ?Social Connections: Not on file  ?Intimate Partner Violence: Not on file  ? ? ?Family History  ?Problem Relation Age of Onset  ? Diabetes Father   ? Lung cancer Father 80  ? Heart attack Father   ? Throat cancer Maternal Aunt   ? ? ? ?Current Outpatient Medications:  ?  acetaminophen (TYLENOL) 500 MG tablet, Take 500-1,000 mg by mouth every 6 (six) hours as needed (for pain.)., Disp: , Rfl:  ?   budesonide-formoterol (SYMBICORT) 160-4.5 MCG/ACT inhaler, Inhale 2 puffs into the lungs in the morning and at bedtime., Disp: 1 each, Rfl: 12 ?  levofloxacin (LEVAQUIN) 500 MG tablet, Take 1 tablet (500 mg total) by mouth daily., Disp: 7 tablet, Rfl: 0 ?  levothyroxine (SYNTHROID) 137 MCG tablet, Take 1 tablet (137 mcg total) by mouth daily before breakfast., Disp: 90 tablet, Rfl: 0 ?  morphine (MS CONTIN) 15 MG 12 hr tablet, Take 1 tablet (15 mg total) by mouth every 12 (twelve) hours., Disp: 60 tablet, Rfl: 0 ?  oxyCODONE (OXY IR/ROXICODONE) 5 MG immediate release tablet, Take 1 tablet (5 mg total) by mouth every 4 (four) hours as needed for severe pain., Disp: 60 tablet, Rfl: 0 ?  prochlorperazine (COMPAZINE) 10 MG tablet, Take 1 tablet (10 mg total) by mouth every 6 (six) hours as needed for nausea or vomiting., Disp: 30 tablet, Rfl: 1 ?  VENTOLIN HFA 108 (90 Base) MCG/ACT inhaler, TAKE 2 PUFFS BY MOUTH EVERY  6 HOURS AS NEEDED FOR WHEEZE OR SHORTNESS OF BREATH, Disp: 18 each, Rfl: 2 ?No current facility-administered medications for this visit. ? ?Facility-Administered Medications Ordered in Other Visits:  ?  0.9 %  sodium chloride infusion, , Intravenous, Once, Vallery Sa, Last Rate: 999 mL/hr at 04/11/22 1034, New Bag at 04/11/22 1034 ? ?Physical exam:  ?Vitals:  ? 04/11/22 0948  ?BP: 103/72  ?Pulse: 77  ?Resp: 17  ?Temp: (!) 97.4 ?F (36.3 ?C)  ?TempSrc: Tympanic  ?SpO2: 100%  ?Weight: 181 lb (82.1 kg)  ? ?Physical Exam ?Constitutional:   ?   General: He is not in acute distress. ?Cardiovascular:  ?   Rate and Rhythm: Normal rate and regular rhythm.  ?   Heart sounds: Normal heart sounds.  ?Pulmonary:  ?   Effort: Pulmonary effort is normal.  ?   Comments: Breath sounds decreased over left side as compared to right ?Abdominal:  ?   General: Bowel sounds are normal.  ?   Palpations: Abdomen is soft.  ?Skin: ?   General: Skin is warm and dry.  ?Neurological:  ?   Mental Status: He is alert and  oriented to person, place, and time.  ?  ? ? ?  Latest Ref Rng & Units 04/11/2022  ?  9:23 AM  ?CMP  ?Glucose 70 - 99 mg/dL 91    ?BUN 6 - 20 mg/dL 8    ?Creatinine 0.61 - 1.24 mg/dL 0.73    ?Sodium 135 - 145 mmol/L 128    ?

## 2022-04-18 ENCOUNTER — Other Ambulatory Visit: Payer: Self-pay | Admitting: Oncology

## 2022-04-18 DIAGNOSIS — G893 Neoplasm related pain (acute) (chronic): Secondary | ICD-10-CM

## 2022-04-19 ENCOUNTER — Encounter: Payer: Self-pay | Admitting: Oncology

## 2022-04-19 MED ORDER — OXYCODONE HCL 5 MG PO TABS: 5.0000 mg | ORAL_TABLET | ORAL | 0 refills | Status: DC | PRN

## 2022-05-03 ENCOUNTER — Ambulatory Visit
Admission: RE | Admit: 2022-05-03 | Discharge: 2022-05-03 | Disposition: A | Discharge status: Home / Self Care | Payer: Medicaid Other | Source: Ambulatory Visit | Attending: Medical Oncology | Admitting: Medical Oncology

## 2022-05-03 ENCOUNTER — Other Ambulatory Visit: Payer: Self-pay

## 2022-05-03 DIAGNOSIS — J439 Emphysema, unspecified: Secondary | ICD-10-CM | POA: Diagnosis not present

## 2022-05-03 DIAGNOSIS — E871 Hypo-osmolality and hyponatremia: Secondary | ICD-10-CM | POA: Insufficient documentation

## 2022-05-03 DIAGNOSIS — G893 Neoplasm related pain (acute) (chronic): Secondary | ICD-10-CM | POA: Diagnosis present

## 2022-05-03 DIAGNOSIS — K7689 Other specified diseases of liver: Secondary | ICD-10-CM | POA: Diagnosis not present

## 2022-05-03 DIAGNOSIS — C3490 Malignant neoplasm of unspecified part of unspecified bronchus or lung: Secondary | ICD-10-CM | POA: Diagnosis not present

## 2022-05-03 DIAGNOSIS — I700 Atherosclerosis of aorta: Secondary | ICD-10-CM | POA: Diagnosis not present

## 2022-05-03 DIAGNOSIS — C3412 Malignant neoplasm of upper lobe, left bronchus or lung: Secondary | ICD-10-CM | POA: Insufficient documentation

## 2022-05-03 DIAGNOSIS — J189 Pneumonia, unspecified organism: Secondary | ICD-10-CM | POA: Diagnosis present

## 2022-05-03 DIAGNOSIS — N400 Enlarged prostate without lower urinary tract symptoms: Secondary | ICD-10-CM | POA: Diagnosis not present

## 2022-05-03 MED ORDER — IOHEXOL 300 MG/ML  SOLN
100.0000 mL | Freq: Once | INTRAMUSCULAR | Status: AC | PRN
  Administered 2022-05-03: 100 mL via INTRAVENOUS

## 2022-05-04 ENCOUNTER — Inpatient Hospital Stay: Payer: Medicaid Other | Attending: Oncology | Admitting: Oncology

## 2022-05-04 ENCOUNTER — Other Ambulatory Visit: Payer: Self-pay

## 2022-05-04 ENCOUNTER — Other Ambulatory Visit: Payer: Self-pay | Admitting: *Deleted

## 2022-05-04 ENCOUNTER — Encounter: Payer: Self-pay | Admitting: Oncology

## 2022-05-04 ENCOUNTER — Telehealth: Payer: Self-pay | Admitting: *Deleted

## 2022-05-04 VITALS — BP 102/64 | HR 68 | Temp 97.5°F | Resp 16 | Ht 74.0 in | Wt 179.5 lb

## 2022-05-04 DIAGNOSIS — C3490 Malignant neoplasm of unspecified part of unspecified bronchus or lung: Secondary | ICD-10-CM

## 2022-05-04 DIAGNOSIS — Z833 Family history of diabetes mellitus: Secondary | ICD-10-CM | POA: Insufficient documentation

## 2022-05-04 DIAGNOSIS — R5383 Other fatigue: Secondary | ICD-10-CM | POA: Diagnosis not present

## 2022-05-04 DIAGNOSIS — Z79899 Other long term (current) drug therapy: Secondary | ICD-10-CM | POA: Diagnosis not present

## 2022-05-04 DIAGNOSIS — Z801 Family history of malignant neoplasm of trachea, bronchus and lung: Secondary | ICD-10-CM | POA: Insufficient documentation

## 2022-05-04 DIAGNOSIS — R61 Generalized hyperhidrosis: Secondary | ICD-10-CM | POA: Insufficient documentation

## 2022-05-04 DIAGNOSIS — R0789 Other chest pain: Secondary | ICD-10-CM | POA: Insufficient documentation

## 2022-05-04 DIAGNOSIS — C3412 Malignant neoplasm of upper lobe, left bronchus or lung: Secondary | ICD-10-CM | POA: Insufficient documentation

## 2022-05-04 DIAGNOSIS — J439 Emphysema, unspecified: Secondary | ICD-10-CM | POA: Insufficient documentation

## 2022-05-04 DIAGNOSIS — D1803 Hemangioma of intra-abdominal structures: Secondary | ICD-10-CM | POA: Diagnosis not present

## 2022-05-04 DIAGNOSIS — I700 Atherosclerosis of aorta: Secondary | ICD-10-CM | POA: Diagnosis not present

## 2022-05-04 DIAGNOSIS — Z808 Family history of malignant neoplasm of other organs or systems: Secondary | ICD-10-CM | POA: Insufficient documentation

## 2022-05-04 DIAGNOSIS — Z28310 Unvaccinated for covid-19: Secondary | ICD-10-CM | POA: Insufficient documentation

## 2022-05-04 DIAGNOSIS — F17210 Nicotine dependence, cigarettes, uncomplicated: Secondary | ICD-10-CM | POA: Diagnosis not present

## 2022-05-04 DIAGNOSIS — Z7189 Other specified counseling: Secondary | ICD-10-CM | POA: Diagnosis not present

## 2022-05-04 DIAGNOSIS — R634 Abnormal weight loss: Secondary | ICD-10-CM | POA: Diagnosis not present

## 2022-05-04 DIAGNOSIS — R058 Other specified cough: Secondary | ICD-10-CM | POA: Diagnosis not present

## 2022-05-04 DIAGNOSIS — Z8249 Family history of ischemic heart disease and other diseases of the circulatory system: Secondary | ICD-10-CM | POA: Insufficient documentation

## 2022-05-04 DIAGNOSIS — Z7951 Long term (current) use of inhaled steroids: Secondary | ICD-10-CM | POA: Diagnosis not present

## 2022-05-04 DIAGNOSIS — R52 Pain, unspecified: Secondary | ICD-10-CM

## 2022-05-04 DIAGNOSIS — R590 Localized enlarged lymph nodes: Secondary | ICD-10-CM | POA: Insufficient documentation

## 2022-05-04 DIAGNOSIS — G893 Neoplasm related pain (acute) (chronic): Secondary | ICD-10-CM | POA: Diagnosis not present

## 2022-05-04 LAB — EXPECTORATED SPUTUM ASSESSMENT W GRAM STAIN, RFLX TO RESP C

## 2022-05-04 MED ORDER — CIPROFLOXACIN HCL 750 MG PO TABS: 750.0000 mg | ORAL_TABLET | Freq: Two times a day (BID) | ORAL | 0 refills | Status: DC

## 2022-05-04 MED ORDER — OXYCODONE HCL 10 MG PO TABS: 10.0000 mg | ORAL_TABLET | ORAL | 0 refills | Status: DC | PRN

## 2022-05-04 MED ORDER — MORPHINE SULFATE ER 30 MG PO TBCR: 30.0000 mg | EXTENDED_RELEASE_TABLET | Freq: Two times a day (BID) | ORAL | 0 refills | Status: DC

## 2022-05-04 NOTE — Progress Notes (Signed)
Orders entered

## 2022-05-04 NOTE — Progress Notes (Signed)
? ? ? ?Hematology/Oncology Consult note ?[place]  ?[phone]) [phone] [phone] ? ?Patient Care Team: ?Patient, No Pcp Per (Inactive) as PCP - General (General Practice) ?Telford Nab, RN as Sales executive ?Sindy Guadeloupe, MD as Consulting Physician (Hematology and Oncology)  ? ?Name of the patient: Wesley Ferguson  ?[id number]  ?[birth year]  ? ?Date of visit: 05/04/22 ? 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  ? ?Chief complaint/ Reason for visit-discuss CT scan results and further management ? ?Heme/Onc history: Patient is a 56 year old male who was admitted to the hospital in July 2021 with symptoms of chest pain and streaky hemoptysis.  He had a chest x-ray followed by a CT scan which showed a 5.7 x 4.4 cm left upper lobe mass along with left hilar adenopathy.  There was a low-density 3.6 lesion noted in the right hepatic lobe which ultimately turned out to be a hemangioma on MRI liver.  PET CT scan showed hypermetabolic activity in the left upper lobe and left hilar nodal tissue.  Subcarinal and right paratracheal lymph nodes with mild hypermetabolic features. ?  ?Patient underwent bronchoscopies and biopsies.Left upper lobe biopsy was consistent with non-small cell carcinoma favor squamous cell carcinoma right subcarinal lymph node biopsy was negative for malignancy. ?  ?Plan is for concurrent chemoradiation with carbotaxol followed by maintenance durvalumab.  Patient does not want to get a port placed.  He is also refused Covid vaccination ?  ?NGS testing showed high tumor mutational burden.  PD-L1 90%.  MSI stable.  Patient PIK3CA and notch2 FCHO2 fusion, T p53 ?  ?Scans in May 2022 Showed 2 hypermetabolic nodules in the left upper lobe and new hypermetabolic cavitary nodule in the right upper lobe concerning for disease progression.  No evidence of distant metastatic disease.  Patient was seen by Dr. Donella Stade  and received SBRT to the left lung with plans for continued monitoring of the right upper lobe lung nodule.  Patient completed maintenance durvalumab in March 2023  ? ?interval history-patient reports doing poorly.  He has left-sided chest wall pain which makes it difficult for him to lay on that side.  States that long-acting morphine and as needed oxycodone have not been helping.  Appetite is poor and he has been losing weight.  Also reports productive cough.  Denies any fever ? ?ECOG PS- 2 ?Pain scale- 4 ? ? ?Review of systems- Review of Systems  ?Constitutional:  Positive for malaise/fatigue. Negative for chills, fever and weight loss.  ?HENT:  Negative for congestion, ear discharge and nosebleeds.   ?Eyes:  Negative for blurred vision.  ?Respiratory:  Positive for cough, sputum production and shortness of breath. Negative for hemoptysis and wheezing.   ?Cardiovascular:  Negative for chest pain, palpitations, orthopnea and claudication.  ?Gastrointestinal:  Negative for abdominal pain, blood in stool, constipation, diarrhea, heartburn, melena, nausea and vomiting.  ?Genitourinary:  Negative for dysuria, flank pain, frequency, hematuria and urgency.  ?Musculoskeletal:  Negative for back pain, joint pain and myalgias.  ?Skin:  Negative for rash.  ?Neurological:  Negative for dizziness, tingling, focal weakness, seizures, weakness and headaches.  ?Endo/Heme/Allergies:  Does not bruise/bleed easily.  ?Psychiatric/Behavioral:  Negative for depression and suicidal ideas. The patient does not have insomnia.    ? ? ?No Known Allergies ? ? ?Past Medical History:  ?Diagnosis Date  ? COPD (chronic obstructive pulmonary disease) ([place])   ? Emphysema of lung ([place])   ?  Lung cancer ([place]) 06/2020  ? Pneumonia   ? ? ? ?Past Surgical History:  ?Procedure Laterality Date  ? BACK SURGERY  2017  ? lumbar region herniated disc  ? VIDEO BRONCHOSCOPY WITH ENDOBRONCHIAL NAVIGATION N/A 08/07/2020  ? Procedure: VIDEO BRONCHOSCOPY WITH  ENDOBRONCHIAL NAVIGATION;  Surgeon: Tyler Pita, MD;  Location: [facility] ORS;  Service: Pulmonary;  Laterality: N/A;  ? VIDEO BRONCHOSCOPY WITH ENDOBRONCHIAL ULTRASOUND N/A 08/07/2020  ? Procedure: VIDEO BRONCHOSCOPY WITH ENDOBRONCHIAL ULTRASOUND;  Surgeon: Tyler Pita, MD;  Location: [facility] ORS;  Service: Pulmonary;  Laterality: N/A;  ? ? ?Social History  ? ?Socioeconomic History  ? Marital status: Divorced  ?  Spouse name: Not on file  ? Number of children: Not on file  ? Years of education: Not on file  ? Highest education level: Not on file  ?Occupational History  ? Not on file  ?Tobacco Use  ? Smoking status: Every Day  ?  Packs/day: 2.50  ?  Years: 35.00  ?  Pack years: 87.50  ?  Types: Cigarettes  ? Smokeless tobacco: Former  ?  Types: Chew  ? Tobacco comments:  ?  2PPD 01/19/2021  ?Vaping Use  ? Vaping Use: Never used  ?Substance and Sexual Activity  ? Alcohol use: Not Currently  ?  Comment: 1 beer yest and not before 1 month ago  ? Drug use: No  ? Sexual activity: Not Currently  ?Other Topics Concern  ? Not on file  ?Social History Narrative  ? Not on file  ? ?Social Determinants of Health  ? ?Financial Resource Strain: Not on file  ?Food Insecurity: Not on file  ?Transportation Needs: Not on file  ?Physical Activity: Not on file  ?Stress: Not on file  ?Social Connections: Not on file  ?Intimate Partner Violence: Not on file  ? ? ?Family History  ?Problem Relation Age of Onset  ? Diabetes Father   ? Lung cancer Father 45  ? Heart attack Father   ? Throat cancer Maternal Aunt   ? ? ? ?Current Outpatient Medications:  ?  acetaminophen (TYLENOL) 500 MG tablet, Take 500-1,000 mg by mouth every 6 (six) hours as needed (for pain.)., Disp: , Rfl:  ?  budesonide-formoterol (SYMBICORT) 160-4.5 MCG/ACT inhaler, Inhale 2 puffs into the lungs in the morning and at bedtime., Disp: 1 each, Rfl: 12 ?  prochlorperazine (COMPAZINE) 10 MG tablet, Take 1 tablet (10 mg total) by mouth every 6 (six) hours as needed for  nausea or vomiting., Disp: 30 tablet, Rfl: 1 ?  VENTOLIN HFA 108 (90 Base) MCG/ACT inhaler, TAKE 2 PUFFS BY MOUTH EVERY 6 HOURS AS NEEDED FOR WHEEZE OR SHORTNESS OF BREATH, Disp: 18 each, Rfl: 2 ?  ciprofloxacin (CIPRO) 750 MG tablet, Take 1 tablet (750 mg total) by mouth 2 (two) times daily., Disp: 42 tablet, Rfl: 0 ?  levothyroxine (SYNTHROID) 137 MCG tablet, Take 1 tablet (137 mcg total) by mouth daily before breakfast. (Patient not taking: Reported on 05/04/2022), Disp: 90 tablet, Rfl: 0 ?  morphine (MS CONTIN) 30 MG 12 hr tablet, Take 1 tablet (30 mg total) by mouth every 12 (twelve) hours., Disp: 60 tablet, Rfl: 0 ?  Oxycodone HCl 10 MG TABS, Take 1 tablet (10 mg total) by mouth every 4 (four) hours as needed., Disp: 180 tablet, Rfl: 0 ? ?Physical exam:  ?Vitals:  ? 05/04/22 1053  ?BP: 102/64  ?Pulse: 68  ?Resp: 16  ?Temp: (!) 97.5 ?F (36.4 ?C)  ?TempSrc: Oral  ?  Weight: 179 lb 8 oz (81.4 kg)  ?Height: 6' 2" (1.88 m)  ? ?Physical Exam ?Constitutional:   ?   Comments: Appears fatigued and in distress  ?Cardiovascular:  ?   Rate and Rhythm: Normal rate and regular rhythm.  ?   Heart sounds: Normal heart sounds.  ?Pulmonary:  ?   Comments: Breath sounds decreased over left lung diffusely.  Breath sounds on the right side and normal ?Abdominal:  ?   General: Bowel sounds are normal.  ?   Palpations: Abdomen is soft.  ?Skin: ?   General: Skin is warm and dry.  ?Neurological:  ?   Mental Status: He is alert and oriented to person, place, and time.  ?  ? ? ?  Latest Ref Rng & Units 04/11/2022  ?  9:23 AM  ?CMP  ?Glucose 70 - 99 mg/dL 91    ?BUN 6 - 20 mg/dL 8    ?Creatinine 0.61 - 1.24 mg/dL 0.73    ?Sodium 135 - 145 mmol/L 128    ?Potassium 3.5 - 5.1 mmol/L 4.5    ?Chloride 98 - 111 mmol/L 95    ?CO2 22 - 32 mmol/L 26    ?Calcium 8.9 - 10.3 mg/dL 8.5    ? ? ?  Latest Ref Rng & Units 04/11/2022  ?  9:23 AM  ?CBC  ?WBC 4.0 - 10.5 K/uL 12.2    ?Hemoglobin 13.0 - 17.0 g/dL 10.7    ?Hematocrit 39.0 - 52.0 % 33.1     ?Platelets 150 - 400 K/uL 520    ? ? ?No images are attached to the encounter. ? ?CT CHEST ABDOMEN PELVIS W CONTRAST ? ?Addendum Date: 05/04/2022   ?ADDENDUM REPORT: 05/04/2022 11:21 ADDENDUM: Given history of immunothe

## 2022-05-04 NOTE — Progress Notes (Signed)
Pt has been sick for 3 days n&v. He is in pain from left axilla to left shoulder and not able to sleep due to this. Smoking not 2.5 packs of cigarettes due to can't sleep.ran out of pain meds ?

## 2022-05-04 NOTE — Telephone Encounter (Signed)
Called report ? ?ADDENDUM REPORT: 05/04/2022 11:21 ?  ?ADDENDUM: ?Given history of immunotherapy and presence of upper lung ?cavitation, recommend QuantiFERON gold to exclude tuberculosis. ?  ?These results will be called to the ordering clinician or ?representative by the Radiologist Assistant, and communication ?documented in the PACS or [employer]. ?  ?  ?Electronically Signed ?  By: Yetta Glassman M.D. ?  On: 05/04/2022 11:21 ?

## 2022-05-05 ENCOUNTER — Encounter: Payer: Self-pay | Admitting: Oncology

## 2022-05-07 LAB — CULTURE, RESPIRATORY W GRAM STAIN: Culture: NORMAL

## 2022-05-10 ENCOUNTER — Other Ambulatory Visit: Payer: Self-pay | Admitting: *Deleted

## 2022-05-10 MED ORDER — OXYCODONE HCL 10 MG PO TABS: 10.0000 mg | ORAL_TABLET | ORAL | 0 refills | Status: DC | PRN

## 2022-05-10 NOTE — Telephone Encounter (Signed)
[facility] has been unable to fill patient prescription for his Oxycodone as it is on Back order, They told her to get it filled at another pharmacy.  I have called [facility] and they have enough in stock to fill his prescription of 180 tabs I had asked her if Wesley Ferguson would be ok and she yes ?

## 2022-05-11 ENCOUNTER — Encounter: Payer: Self-pay | Admitting: Pulmonary Disease

## 2022-05-11 ENCOUNTER — Other Ambulatory Visit
Admission: RE | Admit: 2022-05-11 | Discharge: 2022-05-11 | Disposition: A | Discharge status: Home / Self Care | Payer: Medicaid Other | Source: Ambulatory Visit | Attending: Pulmonary Disease | Admitting: Pulmonary Disease

## 2022-05-11 ENCOUNTER — Ambulatory Visit: Payer: Medicaid Other | Admitting: Pulmonary Disease

## 2022-05-11 VITALS — BP 100/62 | HR 84 | Temp 98.4°F | Ht 74.0 in | Wt 180.0 lb

## 2022-05-11 DIAGNOSIS — J440 Chronic obstructive pulmonary disease with acute lower respiratory infection: Secondary | ICD-10-CM | POA: Diagnosis not present

## 2022-05-11 DIAGNOSIS — J850 Gangrene and necrosis of lung: Secondary | ICD-10-CM

## 2022-05-11 DIAGNOSIS — F17210 Nicotine dependence, cigarettes, uncomplicated: Secondary | ICD-10-CM | POA: Diagnosis not present

## 2022-05-11 DIAGNOSIS — C3492 Malignant neoplasm of unspecified part of left bronchus or lung: Secondary | ICD-10-CM

## 2022-05-11 LAB — COMPREHENSIVE METABOLIC PANEL
ALT: 15 U/L (ref 0–44)
AST: 28 U/L (ref 15–41)
Albumin: 3.3 g/dL — ABNORMAL LOW (ref 3.5–5.0)
Alkaline Phosphatase: 56 U/L (ref 38–126)
Anion gap: 9 (ref 5–15)
BUN: 11 mg/dL (ref 6–20)
CO2: 28 mmol/L (ref 22–32)
Calcium: 8.8 mg/dL — ABNORMAL LOW (ref 8.9–10.3)
Chloride: 93 mmol/L — ABNORMAL LOW (ref 98–111)
Creatinine, Ser: 1.76 mg/dL — ABNORMAL HIGH (ref 0.61–1.24)
GFR, Estimated: 45 mL/min — ABNORMAL LOW (ref >60)
Glucose, Bld: 96 mg/dL (ref 70–99)
Potassium: 4.3 mmol/L (ref 3.5–5.1)
Sodium: 130 mmol/L — ABNORMAL LOW (ref 135–145)
Total Bilirubin: 0.5 mg/dL (ref 0.3–1.2)
Total Protein: 7.3 g/dL (ref 6.5–8.1)

## 2022-05-11 LAB — CBC WITH DIFFERENTIAL/PLATELET
Abs Immature Granulocytes: 0.09 10*3/uL — ABNORMAL HIGH (ref 0.00–0.07)
Basophils Absolute: 0.1 10*3/uL (ref 0.0–0.1)
Basophils Relative: 1 %
Eosinophils Absolute: 0.7 10*3/uL — ABNORMAL HIGH (ref 0.0–0.5)
Eosinophils Relative: 6 %
HCT: 30.3 % — ABNORMAL LOW (ref 39.0–52.0)
Hemoglobin: 10 g/dL — ABNORMAL LOW (ref 13.0–17.0)
Immature Granulocytes: 1 %
Lymphocytes Relative: 9 %
Lymphs Abs: 1.1 10*3/uL (ref 0.7–4.0)
MCH: 26.8 pg (ref 26.0–34.0)
MCHC: 33 g/dL (ref 30.0–36.0)
MCV: 81.2 fL (ref 80.0–100.0)
Monocytes Absolute: 1 10*3/uL (ref 0.1–1.0)
Monocytes Relative: 8 %
Neutro Abs: 9.6 10*3/uL — ABNORMAL HIGH (ref 1.7–7.7)
Neutrophils Relative %: 75 %
Platelets: 457 10*3/uL — ABNORMAL HIGH (ref 150–400)
RBC: 3.73 MIL/uL — ABNORMAL LOW (ref 4.22–5.81)
RDW: 15.4 % (ref 11.5–15.5)
WBC: 12.6 10*3/uL — ABNORMAL HIGH (ref 4.0–10.5)
nRBC: 0 % (ref 0.0–0.2)

## 2022-05-11 NOTE — Patient Instructions (Signed)
Continue your antibiotic until its all gone. ? ?We are going to get some blood work drawn today. ? ?We are going to see you on 21 June.  Keep that appointment. ?

## 2022-05-11 NOTE — Progress Notes (Signed)
Subjective:    Patient ID: Wesley Ferguson, male    DOB: May 23, 1966, 56 y.o.   MRN: [id number]  HPI The patient is a 56 year old current smoker (2 PPD) previously diagnosed with squamous cell carcinoma of the left lung in August 2021.  He underwent chemo XRT.  He presented at that time with a postobstructive pneumonia.  The patient also has stage III COPD by PFTs obtained on 12 May 2021.  At that time his FEV1 was 1.94 L or 44% of predicted.  He has been followed by oncology.  He had been receiving Durvalumab after concurrent chemotherapy and radiation previously.  He has had difficulties procuring inhalers for his COPD he had been controlled with Breztri 2 puffs twice a day but his insurance will not cover this and we had to treat him with Symbicort and Spiriva.  He is now back on Breztri 2 puffs twice a day.  Around 10 May he was seen by Dr. Smith Robert after he developed issues with purulent sputum production and chest pain.  He is currently treated with antibiotics for necrotizing pneumonia.  He has noted improvement on his chest pain with the antibiotics.  Overall he feels that he is getting better.  Notes that his purulent sputum production has improved.  He has not had any hemoptysis.  Overall he feels "okay" he does not endorse any other symptomatology today.  He is taciturn.    Review of Systems A 10 point review of systems was performed and it is as noted above otherwise negative.  Patient Active Problem List   Diagnosis Date Noted   Multiple tracheobronchial mucus plugs    Mediastinal adenopathy    Malignant neoplasm of unspecified part of unspecified bronchus or lung (HCC) 08/14/2020   Goals of care, counseling/discussion 08/14/2020   Cavitating mass of upper lobe of left lung 07/20/2020   Nicotine dependence 07/20/2020   Cavitating mass in left upper lung lobe 07/20/2020   Hyponatremia 07/20/2020   Postobstructive pneumonia 07/20/2020   Back pain 11/28/2013   Lumbar radiculopathy  11/28/2013   Social History   Tobacco Use   Smoking status: Every Day    Current packs/day: 4.00    Average packs/day: 4.0 packs/day for 35.0 years (140.0 ttl pk-yrs)    Types: Cigarettes   Smokeless tobacco: Former    Types: Chew   Tobacco comments:    2-2.5 PPD   Substance Use Topics   Alcohol use: Not Currently    Comment: 1 beer yest and not before 1 month ago   Meds reviewed with the patient.   There is no immunization history on file for this patient.      Objective:   Physical Exam BP 100/62 (BP Location: Left Arm, Cuff Size: Normal)   Pulse 84   Temp 98.4 F (36.9 C) (Temporal)   Ht 6\' 2"  (1.88 m)   Wt 180 lb (81.6 kg)   SpO2 96%   BMI 23.11 kg/m   SpO2: 96 % O2 Device: None (Room air)  GENERAL: Well-developed well-nourished gentleman, no acute distress.  Fully ambulatory.  HEAD: Normocephalic, atraumatic.  EYES: Pupils equal, round, reactive to light.  No scleral icterus.   MOUTH: teeth in poor repair, missing teeth, oral mucosa moist.  No thrush. NECK: Supple. No thyromegaly. Trachea midline. No JVD.  No adenopathy. PULMONARY: Good air entry bilaterally.  Coarse breath sounds, no other adventitious sounds. CARDIOVASCULAR: S1 and S2. Regular rate and rhythm.  No rubs, murmurs or gallops heard. GASTROINTESTINAL:  Benign. MUSCULOSKELETAL: No joint deformity, no clubbing, no edema.  NEUROLOGIC: No focal deficit, fluent speech.  Appears to have mild memory impairment.  No gait disturbance. SKIN: Intact,warm,dry.  Limited exam of skin: No rashes PSYCH: Affect flat, taciturn.      Assessment & Plan:     ICD-10-CM   1. Necrotizing pneumonia (HCC)  J85.0 CBC w/Diff    Comp Met (CMET)   Continue antibiotics Recommend total of 21 days    2. COPD with acute lower respiratory infection (HCC)  J44.0    Continue Breztri 2 puffs twice a day Continue as needed albuterol    3. Squamous cell carcinoma lung, left (HCC)  C34.92    Will need reassessment with CT  post pneumonia treatment    4. Tobacco dependence due to cigarettes  F17.210    Patient counseled regards to discontinuation of smoking Oral counseling time 3 to 5 minutes     Orders Placed This Encounter  Procedures   CBC w/Diff    Standing Status:   Future    Number of Occurrences:   1    Standing Expiration Date:   05/12/2023   Comp Met (CMET)    Standing Status:   Future    Number of Occurrences:   1    Standing Expiration Date:   05/12/2023   Patient is currently on ciprofloxacin 750 mg twice a day he will be treated for a total of 21 days.  The therapy was started on 54 May 2023.  We will see the patient in follow-up as previously scheduled on 21 June.  He is to contact us prior to that time should any new difficulties arise.  Gailen Shelter, MD Advanced Bronchoscopy PCCM [facility]    *This note was dictated using voice recognition software/Dragon.  Despite best efforts to proofread, errors can occur which can change the meaning. Any transcriptional errors that result from this process are unintentional and may not be fully corrected at the time of dictation.

## 2022-05-12 ENCOUNTER — Telehealth: Payer: Self-pay | Admitting: Acute Care

## 2022-05-12 ENCOUNTER — Telehealth: Payer: Self-pay | Admitting: Pulmonary Disease

## 2022-05-12 ENCOUNTER — Other Ambulatory Visit: Payer: Self-pay

## 2022-05-12 DIAGNOSIS — J850 Gangrene and necrosis of lung: Secondary | ICD-10-CM

## 2022-05-12 DIAGNOSIS — C3490 Malignant neoplasm of unspecified part of unspecified bronchus or lung: Secondary | ICD-10-CM

## 2022-05-12 MED ORDER — AMOXICILLIN-POT CLAVULANATE 500-125 MG PO TABS: 1.0000 | ORAL_TABLET | Freq: Two times a day (BID) | ORAL | 0 refills | Status: DC

## 2022-05-12 NOTE — Telephone Encounter (Signed)
Patient's daughter, Samantha(DPR) is aware that patient can report to [facility] when available for labs. No appt is needed.  She voiced her understanding. Nothing further needed.

## 2022-05-12 NOTE — Telephone Encounter (Signed)
Spoke to patient's daughter, Samantha(DPR). Answered all questions regarding abx change. [name] mentioned that patient is experiencing nausea. She will have patient contact PCP or Dr. Hedwig Morton regarding this.  Nothing further needed.

## 2022-05-18 ENCOUNTER — Encounter: Payer: Self-pay | Admitting: Medical Oncology

## 2022-05-18 ENCOUNTER — Inpatient Hospital Stay: Payer: Medicaid Other

## 2022-05-18 ENCOUNTER — Inpatient Hospital Stay (HOSPITAL_BASED_OUTPATIENT_CLINIC_OR_DEPARTMENT_OTHER): Payer: Medicaid Other | Admitting: Medical Oncology

## 2022-05-18 ENCOUNTER — Other Ambulatory Visit
Admission: RE | Admit: 2022-05-18 | Discharge: 2022-05-18 | Disposition: A | Discharge status: Home / Self Care | Payer: Medicaid Other | Attending: Oncology | Admitting: Oncology

## 2022-05-18 VITALS — BP 106/80 | HR 76 | Temp 96.0°F | Resp 18 | Wt 186.0 lb

## 2022-05-18 DIAGNOSIS — D649 Anemia, unspecified: Secondary | ICD-10-CM

## 2022-05-18 DIAGNOSIS — R059 Cough, unspecified: Secondary | ICD-10-CM

## 2022-05-18 DIAGNOSIS — C3490 Malignant neoplasm of unspecified part of unspecified bronchus or lung: Secondary | ICD-10-CM

## 2022-05-18 DIAGNOSIS — R52 Pain, unspecified: Secondary | ICD-10-CM | POA: Diagnosis not present

## 2022-05-18 DIAGNOSIS — C3412 Malignant neoplasm of upper lobe, left bronchus or lung: Secondary | ICD-10-CM

## 2022-05-18 DIAGNOSIS — J189 Pneumonia, unspecified organism: Secondary | ICD-10-CM | POA: Diagnosis not present

## 2022-05-18 LAB — IRON AND TIBC
Iron: 40 ug/dL — ABNORMAL LOW (ref 45–182)
Saturation Ratios: 15 % — ABNORMAL LOW (ref 17.9–39.5)
TIBC: 273 ug/dL (ref 250–450)
UIBC: 233 ug/dL

## 2022-05-18 LAB — COMPREHENSIVE METABOLIC PANEL
ALT: 18 U/L (ref 0–44)
AST: 35 U/L (ref 15–41)
Albumin: 3.3 g/dL — ABNORMAL LOW (ref 3.5–5.0)
Alkaline Phosphatase: 59 U/L (ref 38–126)
Anion gap: 10 (ref 5–15)
BUN: 8 mg/dL (ref 6–20)
CO2: 29 mmol/L (ref 22–32)
Calcium: 8.7 mg/dL — ABNORMAL LOW (ref 8.9–10.3)
Chloride: 89 mmol/L — ABNORMAL LOW (ref 98–111)
Creatinine, Ser: 1.27 mg/dL — ABNORMAL HIGH (ref 0.61–1.24)
GFR, Estimated: >60 mL/min (ref >60)
Glucose, Bld: 115 mg/dL — ABNORMAL HIGH (ref 70–99)
Potassium: 3.9 mmol/L (ref 3.5–5.1)
Sodium: 128 mmol/L — ABNORMAL LOW (ref 135–145)
Total Bilirubin: 0.5 mg/dL (ref 0.3–1.2)
Total Protein: 7.2 g/dL (ref 6.5–8.1)

## 2022-05-18 LAB — FERRITIN: Ferritin: 319 ng/mL (ref 24–336)

## 2022-05-18 LAB — CBC WITH DIFFERENTIAL/PLATELET
Abs Immature Granulocytes: 0.07 10*3/uL (ref 0.00–0.07)
Basophils Absolute: 0.1 10*3/uL (ref 0.0–0.1)
Basophils Relative: 1 %
Eosinophils Absolute: 1.2 10*3/uL — ABNORMAL HIGH (ref 0.0–0.5)
Eosinophils Relative: 11 %
HCT: 33.2 % — ABNORMAL LOW (ref 39.0–52.0)
Hemoglobin: 10.7 g/dL — ABNORMAL LOW (ref 13.0–17.0)
Immature Granulocytes: 1 %
Lymphocytes Relative: 12 %
Lymphs Abs: 1.2 10*3/uL (ref 0.7–4.0)
MCH: 26.8 pg (ref 26.0–34.0)
MCHC: 32.2 g/dL (ref 30.0–36.0)
MCV: 83.2 fL (ref 80.0–100.0)
Monocytes Absolute: 0.7 10*3/uL (ref 0.1–1.0)
Monocytes Relative: 6 %
Neutro Abs: 7.3 10*3/uL (ref 1.7–7.7)
Neutrophils Relative %: 69 %
Platelets: 448 10*3/uL — ABNORMAL HIGH (ref 150–400)
RBC: 3.99 MIL/uL — ABNORMAL LOW (ref 4.22–5.81)
RDW: 15.6 % — ABNORMAL HIGH (ref 11.5–15.5)
WBC: 10.6 10*3/uL — ABNORMAL HIGH (ref 4.0–10.5)
nRBC: 0 % (ref 0.0–0.2)

## 2022-05-18 LAB — FOLATE: Folate: 12.5 ng/mL (ref >5.9)

## 2022-05-18 LAB — VITAMIN B12: Vitamin B-12: 443 pg/mL (ref 180–914)

## 2022-05-18 NOTE — Progress Notes (Signed)
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  He had a chest x-ray followed by a CT scan which showed a 5.7 x 4.4 cm left upper lobe mass along with left hilar adenopathy.  There was a low-density 3.6 lesion noted in the right hepatic lobe which ultimately turned out to be a hemangioma on MRI liver.  PET CT scan showed hypermetabolic activity in the left upper lobe and left hilar nodal tissue.  Subcarinal and right paratracheal lymph nodes with mild hypermetabolic features.   Patient underwent bronchoscopies and biopsies.Left upper lobe biopsy was consistent with non-small cell carcinoma favor squamous cell carcinoma right subcarinal lymph node biopsy was negative for malignancy.   Plan is for concurrent chemoradiation with carbotaxol followed by maintenance durvalumab.  Patient does not want to get a port placed.  He is also refused Covid vaccination   NGS testing showed high tumor mutational burden.  PD-L1 90%.  MSI stable.  Patient PIK3CA and notch2 FCHO2 fusion, T p53   Scans in May 2022 Showed 2 hypermetabolic nodules in the left upper lobe and new hypermetabolic cavitary nodule in the right upper lobe concerning for disease progression.  No evidence of distant metastatic disease.  Patient was seen by Dr. Donella Stade and received SBRT to the  left lung with plans for continued monitoring of the right upper lobe lung nodule.  Patient completed maintenance durvalumab in March 2023   CT scan from 05/03/2022 showed left lower lobe cavitary pneumonia vs disease progression. He was started on Levaquin x7 days in March 2023 however symptoms failed to improve. Dr. Patsey Berthold consulted and he was started on Ciprofloxacin 750 mg BID x 3 weeks (05/04/2022).   interval history- Patient reports that he is doing well. He is feeling much better than his last visit on 05/04/2022. At this time his chest wall pain has resolved. He continues to have some night sweats but these have improved. No fevers, hemoptysis or productivity to cough. He is tolerating his Ciprofloxacin well. Declines fluids today. Fatigue has improved mildly. Additionally he reports that he pain has greatly improved with the adjustment of his morphine to long-acting 30 mg twice daily and short acting to 10 mg every 4 hours as needed.  ECOG PS- 2 Pain scale- 3   Review of systems- Review of Systems  Constitutional:  Negative for chills, fever, malaise/fatigue and weight loss.  HENT:  Negative for congestion, ear discharge and nosebleeds.   Eyes:  Negative for blurred vision.  Respiratory:  Positive for cough. Negative for hemoptysis, sputum production, shortness of breath and wheezing.   Cardiovascular:  Negative for chest pain, palpitations, orthopnea and claudication.  Gastrointestinal:  Negative for abdominal pain, blood in stool, constipation, diarrhea, heartburn, melena, nausea and vomiting.  Genitourinary:  Negative for dysuria, flank pain, frequency, hematuria and urgency.  Musculoskeletal:  Negative for back pain,  joint pain and myalgias.  Skin:  Negative for rash.  Neurological:  Negative for dizziness, tingling, focal weakness, seizures, weakness and headaches.  Endo/Heme/Allergies:  Does not bruise/bleed easily.  Psychiatric/Behavioral:  Negative for depression and  suicidal ideas. The patient does not have insomnia.      No Known Allergies   Past Medical History:  Diagnosis Date   COPD (chronic obstructive pulmonary disease) ([place])    Emphysema of lung ([place])    Lung cancer ([place]) 06/2020   Pneumonia      Past Surgical History:  Procedure Laterality Date   BACK SURGERY  2017   lumbar region herniated disc   VIDEO BRONCHOSCOPY WITH ENDOBRONCHIAL NAVIGATION N/A 08/07/2020   Procedure: VIDEO BRONCHOSCOPY WITH ENDOBRONCHIAL NAVIGATION;  Surgeon: Tyler Pita, MD;  Location: [facility] ORS;  Service: Pulmonary;  Laterality: N/A;   VIDEO BRONCHOSCOPY WITH ENDOBRONCHIAL ULTRASOUND N/A 08/07/2020   Procedure: VIDEO BRONCHOSCOPY WITH ENDOBRONCHIAL ULTRASOUND;  Surgeon: Tyler Pita, MD;  Location: [facility] ORS;  Service: Pulmonary;  Laterality: N/A;    Social History   Socioeconomic History   Marital status: Divorced    Spouse name: Not on file   Number of children: Not on file   Years of education: Not on file   Highest education level: Not on file  Occupational History   Not on file  Tobacco Use   Smoking status: Every Day    Packs/day: 5.00    Years: 35.00    Pack years: 175.00    Types: Cigarettes   Smokeless tobacco: Former    Types: Chew   Tobacco comments:    3PPD 05/11/2022  Vaping Use   Vaping Use: Never used  Substance and Sexual Activity   Alcohol use: Not Currently    Comment: 1 beer yest and not before 1 month ago   Drug use: No   Sexual activity: Not Currently  Other Topics Concern   Not on file  Social History Narrative   Not on file   Social Determinants of Health   Financial Resource Strain: Not on file  Food Insecurity: Not on file  Transportation Needs: Not on file  Physical Activity: Not on file  Stress: Not on file  Social Connections: Not on file  Intimate Partner Violence: Not on file    Family History  Problem Relation Age of Onset   Diabetes Father    Lung cancer Father 32   Heart attack Father     Throat cancer Maternal Aunt      Current Outpatient Medications:    acetaminophen (TYLENOL) 500 MG tablet, Take 500-1,000 mg by mouth every 6 (six) hours as needed (for pain.)., Disp: , Rfl:    amoxicillin-clavulanate (AUGMENTIN) 500-125 MG tablet, Take 1 tablet (500 mg total) by mouth in the morning and at bedtime., Disp: 20 tablet, Rfl: 0   budesonide-formoterol (SYMBICORT) 160-4.5 MCG/ACT inhaler, Inhale 2 puffs into the lungs in the morning and at bedtime., Disp: 1 each, Rfl: 12   levothyroxine (SYNTHROID) 137 MCG tablet, Take 1 tablet (137 mcg total) by mouth daily before breakfast., Disp: 90 tablet, Rfl: 0   morphine (MS CONTIN) 30 MG 12 hr tablet, Take 1 tablet (30 mg total) by mouth every 12 (twelve) hours., Disp: 60 tablet, Rfl: 0   Oxycodone HCl 10 MG TABS, Take 1 tablet (10 mg total) by mouth every 4 (four) hours as needed., Disp: 180 tablet, Rfl: 0   prochlorperazine (COMPAZINE) 10 MG tablet, Take 1 tablet (10 mg total) by  mouth every 6 (six) hours as needed for nausea or vomiting., Disp: 30 tablet, Rfl: 1   VENTOLIN HFA 108 (90 Base) MCG/ACT inhaler, TAKE 2 PUFFS BY MOUTH EVERY 6 HOURS AS NEEDED FOR WHEEZE OR SHORTNESS OF BREATH, Disp: 18 each, Rfl: 2  Physical exam:  Vitals:   05/18/22 0902 05/18/22 0906  BP:  106/80  Pulse:  76  Resp:  18  Temp:  (!) 96 F (35.6 C)  SpO2:  100%  Weight: 186 lb (84.4 kg)    Physical Exam Constitutional:      Comments: Appears fatigued and in distress  Cardiovascular:     Rate and Rhythm: Normal rate and regular rhythm.     Heart sounds: Normal heart sounds.  Pulmonary:     Comments: Mild intermittent scattered rhonchi throughout bilaterally. No wheezing.  Abdominal:     General: Bowel sounds are normal.     Palpations: Abdomen is soft.  Skin:    General: Skin is warm and dry.  Neurological:     Mental Status: He is alert and oriented to person, place, and time.        Latest Ref Rng & Units 05/18/2022    8:44 AM  CMP   Glucose 70 - 99 mg/dL 115    BUN 6 - 20 mg/dL 8    Creatinine 0.61 - 1.24 mg/dL 1.27    Sodium 135 - 145 mmol/L 128    Potassium 3.5 - 5.1 mmol/L 3.9    Chloride 98 - 111 mmol/L 89    CO2 22 - 32 mmol/L 29    Calcium 8.9 - 10.3 mg/dL 8.7    Total Protein 6.5 - 8.1 g/dL 7.2    Total Bilirubin 0.3 - 1.2 mg/dL 0.5    Alkaline Phos 38 - 126 U/L 59    AST 15 - 41 U/L 35    ALT 0 - 44 U/L 18        Latest Ref Rng & Units 05/18/2022    8:44 AM  CBC  WBC 4.0 - 10.5 K/uL 10.6    Hemoglobin 13.0 - 17.0 g/dL 10.7    Hematocrit 39.0 - 52.0 % 33.2    Platelets 150 - 400 K/uL 448      No images are attached to the encounter.  CT CHEST ABDOMEN PELVIS W CONTRAST  Addendum Date: 05/04/2022   ADDENDUM REPORT: 05/04/2022 11:21 ADDENDUM: Given history of immunotherapy and presence of upper lung cavitation, recommend QuantiFERON gold to exclude tuberculosis. These results will be called to the ordering clinician or representative by the Radiologist Assistant, and communication documented in the PACS or [employer]. Electronically Signed   By: Yetta Glassman M.D.   On: 05/04/2022 11:21   Result Date: 05/04/2022 CLINICAL DATA:  Non-small cell lung cancer; * Tracking Code: BO * EXAM: CT CHEST, ABDOMEN, AND PELVIS WITH CONTRAST TECHNIQUE: Multidetector CT imaging of the chest, abdomen and pelvis was performed following the standard protocol during bolus administration of intravenous contrast. RADIATION DOSE REDUCTION: This exam was performed according to the departmental dose-optimization program which includes automated exposure control, adjustment of the mA and/or kV according to patient size and/or use of iterative reconstruction technique. CONTRAST:  137m OMNIPAQUE IOHEXOL 300 MG/ML  SOLN COMPARISON:  CT chest, abdomen and pelvis dated February 16, 2022 FINDINGS: CT CHEST FINDINGS Cardiovascular: Normal heart size. Small pericardial effusion, similar to prior exam. Normal caliber thoracic aorta  with mild atherosclerotic disease. No suspicious filling defects of the main pulmonary  arteries. Mediastinum/Nodes: Esophagus and thyroid are unremarkable. Left perihilar soft tissue. Stable subcentimeter mediastinal lymph nodes. Lungs/Pleura: Central airways are patent. Centrilobular emphysema. New consolidations of the left lung including thick-walled cavitary lesion with internal fluid density involving the posterior left upper lobe and superior portion of the left lower lobe. Additional new areas of dense consolidation are seen more inferiorly in the superior portion of the left lower lobe. More linear appearing consolidations are seen in the anterior left upper lobe, possibly evolving postradiation change. Stable postradiation changes of the right upper lobe. Musculoskeletal: No chest wall mass or suspicious bone lesions identified. CT ABDOMEN PELVIS FINDINGS Hepatobiliary: Scattered liver lesions with peripheral enhancement, unchanged when compared with prior and likely hemangiomas. No suspicious liver lesions. Gallbladder is unremarkable. No biliary ductal dilation. Pancreas: Unremarkable. No pancreatic ductal dilatation or surrounding inflammatory changes. Spleen: Normal in size without focal abnormality. Adrenals/Urinary Tract: Adrenal glands are unremarkable. Kidneys are normal, without renal calculi, focal lesion, or hydronephrosis. Bladder is unremarkable. Stomach/Bowel: Stomach is within normal limits. Appendix appears normal. No evidence of bowel wall thickening, distention, or inflammatory changes. Vascular/Lymphatic: Aortic atherosclerosis. No enlarged abdominal or pelvic lymph nodes. Reproductive: Mild prostatomegaly. Other: No abdominal wall hernia or abnormality. No abdominopelvic ascites. Musculoskeletal: No acute or significant osseous findings. IMPRESSION: 1. New consolidations of the left upper lobe and superior portion of the left lower lobe with associated cavitation. Differential  considerations include cavitary pneumonia superimposed on postradiation change versus progressive disease. If there are clinical signs of infection, recommend short-term follow-up. If there are no signs of current infection, recommend PET-CT for further evaluation. 2. Stable postradiation changes of the right upper lobe. 3. No evidence of metastatic disease in the abdomen or pelvis. 4. Aortic Atherosclerosis (ICD10-I70.0) and Emphysema (ICD10-J43.9). Electronically Signed: By: Yetta Glassman M.D. On: 05/04/2022 11:08     Visit Diagnosis 1. Healthcare-associated pneumonia   2. Malignant neoplasm of unspecified part of unspecified bronchus or lung ([place])   3. Malignant neoplasm of upper lobe of left lung ([place])   4. Symptomatic anemia   5. Pain aggravated by coughing    Assessment and plan- Patient is a 56 y.o. male with history of stage III lung cancer s/p concurrent chemoradiation followed by maintenance durvalumab presenting with worsening cough and left chest wall pain  Chronic and improving. Continue current regimen. Has imaging planned for end of treatment. Follow up with Pulmonology as indicated.  2-3. Chronic. Continue treatment for suspected HAP. Imaging thereafter.  4. Chronic and stable 5. Chronic and improved with medication adjustment.   PLAN: PT declined fluids today Follow up as previously scheduled with imaging beforehand - sooner should symptoms worsen.   Nelwyn Salisbury PA-C [place] [facility] 05/18/2022 9:55 AM

## 2022-05-23 ENCOUNTER — Encounter: Payer: Self-pay | Admitting: Pulmonary Disease

## 2022-05-25 ENCOUNTER — Ambulatory Visit
Admission: RE | Admit: 2022-05-25 | Discharge: 2022-05-25 | Disposition: A | Discharge status: Home / Self Care | Payer: Medicaid Other | Source: Ambulatory Visit | Attending: Radiation Oncology | Admitting: Radiation Oncology

## 2022-05-25 ENCOUNTER — Encounter: Payer: Self-pay | Admitting: Radiation Oncology

## 2022-05-25 VITALS — BP 105/72 | HR 79 | Temp 96.8°F | Resp 22 | Ht 74.0 in | Wt 186.3 lb

## 2022-05-25 DIAGNOSIS — Z08 Encounter for follow-up examination after completed treatment for malignant neoplasm: Secondary | ICD-10-CM | POA: Diagnosis not present

## 2022-05-25 DIAGNOSIS — C3411 Malignant neoplasm of upper lobe, right bronchus or lung: Secondary | ICD-10-CM | POA: Diagnosis not present

## 2022-05-25 DIAGNOSIS — R0789 Other chest pain: Secondary | ICD-10-CM | POA: Insufficient documentation

## 2022-05-25 DIAGNOSIS — C3412 Malignant neoplasm of upper lobe, left bronchus or lung: Secondary | ICD-10-CM | POA: Diagnosis present

## 2022-05-25 DIAGNOSIS — J984 Other disorders of lung: Secondary | ICD-10-CM

## 2022-05-25 DIAGNOSIS — Z923 Personal history of irradiation: Secondary | ICD-10-CM | POA: Diagnosis not present

## 2022-05-25 DIAGNOSIS — Z87891 Personal history of nicotine dependence: Secondary | ICD-10-CM | POA: Diagnosis not present

## 2022-05-25 DIAGNOSIS — C3490 Malignant neoplasm of unspecified part of unspecified bronchus or lung: Secondary | ICD-10-CM

## 2022-05-25 NOTE — Progress Notes (Signed)
Radiation Oncology Follow up Note  Name: Wesley Ferguson   Date:   05/25/2022 MRN:  [id number] DOB: August 28, 1966    This 56 y.o. male presents to the clinic today for 1 year follow-up status post SBRT as salvage and patient with initial stage IIIa (T2 N2 M0) squamous cell carcinoma left lung treated with concurrent chemoradiation.  REFERRING PROVIDER: No ref. provider found  HPI: Patient is a 56 year old male now out 1 year having completed salvage SBRT to his left lung in patient previously treated for stage IIIa squamous cell carcinoma with concurrent chemoradiation therapy seen today in routine follow-up he is doing fairly well he specifically denies significant cough hemoptysis or chest tightness.[name]  He does have left-sided chest pain and has been seen by Dr. Patsey Berthold.  He completed maintenance the devalue Mab in March.  He had a recent CT scanEarlier this month showing an area of new consolidation left upper lobe and superior portion left lower lobe with associated cavitation.  Malignancy cannot be ruled out PET CT scan was recommended.  COMPLICATIONS OF TREATMENT: none  FOLLOW UP COMPLIANCE: keeps appointments   PHYSICAL EXAM:  BP 105/72   Pulse 79   Temp (!) 96.8 F (36 C)   Resp (!) 22   Ht 6\' 2"  (1.88 m)   Wt 186 lb 4.8 oz (84.5 kg)   BMI 23.92 kg/m  Well-developed well-nourished patient in NAD. HEENT reveals PERLA, EOMI, discs not visualized.  Oral cavity is clear. No oral mucosal lesions are identified. Neck is clear without evidence of cervical or supraclavicular adenopathy. Lungs are clear to A&P. Cardiac examination is essentially unremarkable with regular rate and rhythm without murmur rub or thrill. Abdomen is benign with no organomegaly or masses noted. Motor sensory and DTR levels are equal and symmetric in the upper and lower extremities. Cranial nerves II through XII are grossly intact. Proprioception is intact. No peripheral adenopathy or edema is identified. No motor or  sensory levels are noted. Crude visual fields are within normal range.  RADIOLOGY RESULTS: CT scans reviewed PET CT scan ordered  PLAN: Present time I have ordered a PET CT scan.  He has a PET CT scan scheduled next week although that is only 1 month out from his original CT scan in early May.  Believe a PET CT scan may offer some better clinical insight into exactly what is progressing in his left lung.  This still all could be radiation change.  Will contact Dr. Patsey Berthold and see if that is fine to switch it to a PET CT scan.  I have otherwise asked to see him back in 6 months for follow-up.  I would like to take this opportunity to thank you for allowing me to participate in the care of your patient.Noreene Filbert, MD

## 2022-06-01 ENCOUNTER — Ambulatory Visit: Payer: Medicaid Other

## 2022-06-06 ENCOUNTER — Ambulatory Visit
Admission: RE | Admit: 2022-06-06 | Discharge: 2022-06-06 | Disposition: A | Discharge status: Home / Self Care | Payer: Medicaid Other | Source: Ambulatory Visit | Attending: Radiation Oncology | Admitting: Radiation Oncology

## 2022-06-06 DIAGNOSIS — C3490 Malignant neoplasm of unspecified part of unspecified bronchus or lung: Secondary | ICD-10-CM | POA: Diagnosis not present

## 2022-06-06 DIAGNOSIS — J984 Other disorders of lung: Secondary | ICD-10-CM | POA: Diagnosis not present

## 2022-06-06 DIAGNOSIS — D1803 Hemangioma of intra-abdominal structures: Secondary | ICD-10-CM | POA: Diagnosis not present

## 2022-06-06 DIAGNOSIS — I2510 Atherosclerotic heart disease of native coronary artery without angina pectoris: Secondary | ICD-10-CM | POA: Diagnosis not present

## 2022-06-06 DIAGNOSIS — I3139 Other pericardial effusion (noninflammatory): Secondary | ICD-10-CM | POA: Diagnosis not present

## 2022-06-06 DIAGNOSIS — J439 Emphysema, unspecified: Secondary | ICD-10-CM | POA: Diagnosis not present

## 2022-06-06 LAB — GLUCOSE, CAPILLARY: Glucose-Capillary: 85 mg/dL (ref 70–99)

## 2022-06-06 MED ORDER — FLUDEOXYGLUCOSE F - 18 (FDG) INJECTION
9.6000 | Freq: Once | INTRAVENOUS | Status: AC | PRN
  Administered 2022-06-06: 10.41 via INTRAVENOUS

## 2022-06-10 LAB — ACID FAST SMEAR (AFB, MYCOBACTERIA): Acid Fast Smear: NEGATIVE

## 2022-06-14 ENCOUNTER — Inpatient Hospital Stay: Payer: Medicaid Other

## 2022-06-14 ENCOUNTER — Inpatient Hospital Stay: Payer: Medicaid Other | Admitting: Oncology

## 2022-06-14 ENCOUNTER — Ambulatory Visit: Payer: Medicaid Other | Admitting: Pulmonary Disease

## 2022-06-15 ENCOUNTER — Other Ambulatory Visit: Payer: Self-pay | Admitting: *Deleted

## 2022-06-15 ENCOUNTER — Ambulatory Visit: Payer: Medicaid Other | Admitting: Pulmonary Disease

## 2022-06-16 ENCOUNTER — Encounter: Payer: Self-pay | Admitting: Oncology

## 2022-06-16 LAB — ACID FAST CULTURE WITH REFLEXED SENSITIVITIES (MYCOBACTERIA): Acid Fast Culture: NEGATIVE

## 2022-06-16 MED ORDER — BUDESONIDE-FORMOTEROL FUMARATE 160-4.5 MCG/ACT IN AERO
2.0000 | INHALATION_SPRAY | Freq: Two times a day (BID) | RESPIRATORY_TRACT | 12 refills | Status: DC
Start: 2022-06-16 — End: 2022-07-08

## 2022-06-16 MED ORDER — ALBUTEROL SULFATE HFA 108 (90 BASE) MCG/ACT IN AERS
INHALATION_SPRAY | RESPIRATORY_TRACT | 2 refills | Status: DC
Start: 2022-06-16 — End: 2022-07-08

## 2022-06-22 NOTE — Telephone Encounter (Signed)
Seems like encounter was open in error so closing encounter.  

## 2022-07-08 ENCOUNTER — Inpatient Hospital Stay (HOSPITAL_BASED_OUTPATIENT_CLINIC_OR_DEPARTMENT_OTHER): Payer: Medicaid Other | Admitting: Oncology

## 2022-07-08 ENCOUNTER — Other Ambulatory Visit: Payer: Self-pay

## 2022-07-08 ENCOUNTER — Encounter: Payer: Self-pay | Admitting: Oncology

## 2022-07-08 ENCOUNTER — Inpatient Hospital Stay: Payer: Medicaid Other | Attending: Nurse Practitioner

## 2022-07-08 VITALS — BP 109/77 | HR 69 | Temp 97.4°F | Resp 16 | Wt 186.6 lb

## 2022-07-08 DIAGNOSIS — Z801 Family history of malignant neoplasm of trachea, bronchus and lung: Secondary | ICD-10-CM | POA: Insufficient documentation

## 2022-07-08 DIAGNOSIS — C3412 Malignant neoplasm of upper lobe, left bronchus or lung: Secondary | ICD-10-CM | POA: Insufficient documentation

## 2022-07-08 DIAGNOSIS — Z79899 Other long term (current) drug therapy: Secondary | ICD-10-CM | POA: Insufficient documentation

## 2022-07-08 DIAGNOSIS — Z79890 Hormone replacement therapy (postmenopausal): Secondary | ICD-10-CM | POA: Diagnosis not present

## 2022-07-08 DIAGNOSIS — R0602 Shortness of breath: Secondary | ICD-10-CM | POA: Insufficient documentation

## 2022-07-08 DIAGNOSIS — J439 Emphysema, unspecified: Secondary | ICD-10-CM | POA: Diagnosis not present

## 2022-07-08 DIAGNOSIS — R5383 Other fatigue: Secondary | ICD-10-CM | POA: Diagnosis not present

## 2022-07-08 DIAGNOSIS — F17210 Nicotine dependence, cigarettes, uncomplicated: Secondary | ICD-10-CM | POA: Diagnosis not present

## 2022-07-08 DIAGNOSIS — Z28310 Unvaccinated for covid-19: Secondary | ICD-10-CM | POA: Insufficient documentation

## 2022-07-08 DIAGNOSIS — D649 Anemia, unspecified: Secondary | ICD-10-CM

## 2022-07-08 DIAGNOSIS — Z85118 Personal history of other malignant neoplasm of bronchus and lung: Secondary | ICD-10-CM

## 2022-07-08 DIAGNOSIS — Z08 Encounter for follow-up examination after completed treatment for malignant neoplasm: Secondary | ICD-10-CM

## 2022-07-08 DIAGNOSIS — Z808 Family history of malignant neoplasm of other organs or systems: Secondary | ICD-10-CM | POA: Diagnosis not present

## 2022-07-08 DIAGNOSIS — Z833 Family history of diabetes mellitus: Secondary | ICD-10-CM | POA: Diagnosis not present

## 2022-07-08 DIAGNOSIS — C3490 Malignant neoplasm of unspecified part of unspecified bronchus or lung: Secondary | ICD-10-CM

## 2022-07-08 DIAGNOSIS — R590 Localized enlarged lymph nodes: Secondary | ICD-10-CM | POA: Insufficient documentation

## 2022-07-08 DIAGNOSIS — G893 Neoplasm related pain (acute) (chronic): Secondary | ICD-10-CM

## 2022-07-08 DIAGNOSIS — Z8249 Family history of ischemic heart disease and other diseases of the circulatory system: Secondary | ICD-10-CM | POA: Diagnosis not present

## 2022-07-08 LAB — CBC WITH DIFFERENTIAL/PLATELET
Abs Immature Granulocytes: 0.06 10*3/uL (ref 0.00–0.07)
Basophils Absolute: 0.2 10*3/uL — ABNORMAL HIGH (ref 0.0–0.1)
Basophils Relative: 2 %
Eosinophils Absolute: 1.1 10*3/uL — ABNORMAL HIGH (ref 0.0–0.5)
Eosinophils Relative: 12 %
HCT: 32.2 % — ABNORMAL LOW (ref 39.0–52.0)
Hemoglobin: 10.6 g/dL — ABNORMAL LOW (ref 13.0–17.0)
Immature Granulocytes: 1 %
Lymphocytes Relative: 13 %
Lymphs Abs: 1.3 10*3/uL (ref 0.7–4.0)
MCH: 28.4 pg (ref 26.0–34.0)
MCHC: 32.9 g/dL (ref 30.0–36.0)
MCV: 86.3 fL (ref 80.0–100.0)
Monocytes Absolute: 0.6 10*3/uL (ref 0.1–1.0)
Monocytes Relative: 7 %
Neutro Abs: 6.5 10*3/uL (ref 1.7–7.7)
Neutrophils Relative %: 65 %
Platelets: 328 10*3/uL (ref 150–400)
RBC: 3.73 MIL/uL — ABNORMAL LOW (ref 4.22–5.81)
RDW: 19.1 % — ABNORMAL HIGH (ref 11.5–15.5)
WBC: 9.8 10*3/uL (ref 4.0–10.5)
nRBC: 0 % (ref 0.0–0.2)

## 2022-07-08 LAB — COMPREHENSIVE METABOLIC PANEL
ALT: 20 U/L (ref 0–44)
AST: 52 U/L — ABNORMAL HIGH (ref 15–41)
Albumin: 4.2 g/dL (ref 3.5–5.0)
Alkaline Phosphatase: 77 U/L (ref 38–126)
Anion gap: 7 (ref 5–15)
BUN: 7 mg/dL (ref 6–20)
CO2: 27 mmol/L (ref 22–32)
Calcium: 8.9 mg/dL (ref 8.9–10.3)
Chloride: 94 mmol/L — ABNORMAL LOW (ref 98–111)
Creatinine, Ser: 0.95 mg/dL (ref 0.61–1.24)
GFR, Estimated: >60 mL/min (ref >60)
Glucose, Bld: 109 mg/dL — ABNORMAL HIGH (ref 70–99)
Potassium: 3.9 mmol/L (ref 3.5–5.1)
Sodium: 128 mmol/L — ABNORMAL LOW (ref 135–145)
Total Bilirubin: 0.3 mg/dL (ref 0.3–1.2)
Total Protein: 7.8 g/dL (ref 6.5–8.1)

## 2022-07-08 MED ORDER — BUDESONIDE-FORMOTEROL FUMARATE 160-4.5 MCG/ACT IN AERO: 2.0000 | INHALATION_SPRAY | Freq: Two times a day (BID) | RESPIRATORY_TRACT | 12 refills | Status: DC

## 2022-07-08 MED ORDER — PROCHLORPERAZINE MALEATE 10 MG PO TABS: 10.0000 mg | ORAL_TABLET | Freq: Four times a day (QID) | ORAL | 1 refills | Status: DC | PRN

## 2022-07-08 MED ORDER — ALBUTEROL SULFATE HFA 108 (90 BASE) MCG/ACT IN AERS
INHALATION_SPRAY | RESPIRATORY_TRACT | 2 refills | Status: DC
Start: 2022-07-08 — End: 2022-12-16

## 2022-07-08 MED ORDER — OXYCODONE HCL 10 MG PO TABS: 10.0000 mg | ORAL_TABLET | ORAL | 0 refills | Status: DC | PRN

## 2022-07-08 MED ORDER — MORPHINE SULFATE ER 30 MG PO TBCR: 30.0000 mg | EXTENDED_RELEASE_TABLET | Freq: Two times a day (BID) | ORAL | 0 refills | Status: DC

## 2022-07-10 ENCOUNTER — Encounter: Payer: Self-pay | Admitting: Oncology

## 2022-07-10 NOTE — Progress Notes (Signed)
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  He had a chest x-ray followed by a CT scan which showed a 5.7 x 4.4 cm left upper lobe mass along with left hilar adenopathy.  There was a low-density 3.6 lesion noted in the right hepatic lobe which ultimately turned out to be a hemangioma on MRI liver.  PET CT scan showed hypermetabolic activity in the left upper lobe and left hilar nodal tissue.  Subcarinal and right paratracheal lymph nodes with mild hypermetabolic features.   Patient underwent bronchoscopies and biopsies.Left upper lobe biopsy was consistent with non-small cell carcinoma favor squamous cell carcinoma right subcarinal lymph node biopsy was negative for malignancy.   Plan is for concurrent chemoradiation with carbotaxol followed by maintenance durvalumab.  Patient does not want to get a port placed.  He is also refused Covid vaccination   NGS testing showed high tumor mutational burden.  PD-L1 90%.  MSI stable.  Patient PIK3CA and notch2 FCHO2 fusion, T p53   Scans in May 2022 Showed 2 hypermetabolic nodules in the left upper lobe and new hypermetabolic cavitary nodule in the right upper lobe concerning for disease progression.  No evidence of distant metastatic disease.  Patient was seen by Dr. Donella Stade and received SBRT to the left lung with plans for  continued monitoring of the right upper lobe lung nodule.  Patient completed maintenance durvalumab in March 2023     Interval history-baseline exertional shortness of breath which is unchanged.  He finished his course of Augmentin recently.  Denies any fever.  ECOG PS- 1-2 Pain scale- 2   Review of systems- Review of Systems  Constitutional:  Positive for malaise/fatigue. Negative for chills, fever and weight loss.  HENT:  Negative for congestion, ear discharge and nosebleeds.   Eyes:  Negative for blurred vision.  Respiratory:  Positive for shortness of breath. Negative for cough, hemoptysis, sputum production and wheezing.   Cardiovascular:  Negative for chest pain, palpitations, orthopnea and claudication.  Gastrointestinal:  Negative for abdominal pain, blood in stool, constipation, diarrhea, heartburn, melena, nausea and vomiting.  Genitourinary:  Negative for dysuria, flank pain, frequency, hematuria and urgency.  Musculoskeletal:  Negative for back pain, joint pain and myalgias.  Skin:  Negative for rash.  Neurological:  Negative for dizziness, tingling, focal weakness, seizures, weakness and headaches.  Endo/Heme/Allergies:  Does not bruise/bleed easily.  Psychiatric/Behavioral:  Negative for depression and suicidal ideas. The patient does not have insomnia.       No Known Allergies   Past Medical History:  Diagnosis Date   COPD (chronic obstructive pulmonary disease) ([place])    Emphysema of lung ([place])    Lung cancer ([place]) 06/2020   Pneumonia      Past Surgical History:  Procedure Laterality Date   BACK SURGERY  2017   lumbar region herniated disc   VIDEO BRONCHOSCOPY  WITH ENDOBRONCHIAL NAVIGATION N/A 08/07/2020   Procedure: VIDEO BRONCHOSCOPY WITH ENDOBRONCHIAL NAVIGATION;  Surgeon: Tyler Pita, MD;  Location: [facility] ORS;  Service: Pulmonary;  Laterality: N/A;   VIDEO BRONCHOSCOPY WITH ENDOBRONCHIAL ULTRASOUND N/A 08/07/2020   Procedure: VIDEO BRONCHOSCOPY WITH  ENDOBRONCHIAL ULTRASOUND;  Surgeon: Tyler Pita, MD;  Location: [facility] ORS;  Service: Pulmonary;  Laterality: N/A;    Social History   Socioeconomic History   Marital status: Divorced    Spouse name: Not on file   Number of children: Not on file   Years of education: Not on file   Highest education level: Not on file  Occupational History   Not on file  Tobacco Use   Smoking status: Every Day    Packs/day: 5.00    Years: 35.00    Total pack years: 175.00    Types: Cigarettes   Smokeless tobacco: Former    Types: Chew   Tobacco comments:    3PPD 05/11/2022  Vaping Use   Vaping Use: Never used  Substance and Sexual Activity   Alcohol use: Not Currently    Comment: 1 beer yest and not before 1 month ago   Drug use: No   Sexual activity: Not Currently  Other Topics Concern   Not on file  Social History Narrative   Not on file   Social Determinants of Health   Financial Resource Strain: Not on file  Food Insecurity: Not on file  Transportation Needs: Not on file  Physical Activity: Not on file  Stress: Not on file  Social Connections: Not on file  Intimate Partner Violence: Not on file    Family History  Problem Relation Age of Onset   Diabetes Father    Lung cancer Father 14   Heart attack Father    Throat cancer Maternal Aunt      Current Outpatient Medications:    acetaminophen (TYLENOL) 500 MG tablet, Take 500-1,000 mg by mouth every 6 (six) hours as needed (for pain.)., Disp: , Rfl:    levothyroxine (SYNTHROID) 137 MCG tablet, Take 1 tablet (137 mcg total) by mouth daily before breakfast., Disp: 90 tablet, Rfl: 0   albuterol (VENTOLIN HFA) 108 (90 Base) MCG/ACT inhaler, TAKE 2 PUFFS BY MOUTH EVERY 6 HOURS AS NEEDED FOR WHEEZE OR SHORTNESS OF BREATH, Disp: 18 each, Rfl: 2   budesonide-formoterol (SYMBICORT) 160-4.5 MCG/ACT inhaler, Inhale 2 puffs into the lungs in the morning and at bedtime., Disp: 1 each, Rfl: 12   morphine (MS CONTIN) 30 MG 12 hr  tablet, Take 1 tablet (30 mg total) by mouth every 12 (twelve) hours., Disp: 60 tablet, Rfl: 0   Oxycodone HCl 10 MG TABS, Take 1 tablet (10 mg total) by mouth every 4 (four) hours as needed., Disp: 180 tablet, Rfl: 0   prochlorperazine (COMPAZINE) 10 MG tablet, Take 1 tablet (10 mg total) by mouth every 6 (six) hours as needed for nausea or vomiting., Disp: 30 tablet, Rfl: 1  Physical exam:  Vitals:   07/08/22 1500  BP: 109/77  Pulse: 69  Resp: 16  Temp: (!) 97.4 F (36.3 C)  SpO2: 100%  Weight: 186 lb 9.6 oz (84.6 kg)   Physical Exam Constitutional:      General: He is not in acute distress. Cardiovascular:     Rate and Rhythm: Normal rate and regular rhythm.     Heart sounds: Normal heart sounds.  Pulmonary:     Effort: Pulmonary effort is normal.     Comments: Breath  sounds decreased bilaterally lower bases Skin:    General: Skin is warm and dry.  Neurological:     Mental Status: He is alert and oriented to person, place, and time.         Latest Ref Rng & Units 07/08/2022    2:49 PM  CMP  Glucose 70 - 99 mg/dL 109   BUN 6 - 20 mg/dL 7   Creatinine 0.61 - 1.24 mg/dL 0.95   Sodium 135 - 145 mmol/L 128   Potassium 3.5 - 5.1 mmol/L 3.9   Chloride 98 - 111 mmol/L 94   CO2 22 - 32 mmol/L 27   Calcium 8.9 - 10.3 mg/dL 8.9   Total Protein 6.5 - 8.1 g/dL 7.8   Total Bilirubin 0.3 - 1.2 mg/dL 0.3   Alkaline Phos 38 - 126 U/L 77   AST 15 - 41 U/L 52   ALT 0 - 44 U/L 20       Latest Ref Rng & Units 07/08/2022    2:49 PM  CBC  WBC 4.0 - 10.5 K/uL 9.8   Hemoglobin 13.0 - 17.0 g/dL 10.6   Hematocrit 39.0 - 52.0 % 32.2   Platelets 150 - 400 K/uL 328     Assessment and plan- Patient is a 56 y.o. male with history of stage III lung cancer s/p concurrent chemoradiation followed by maintenance durvalumab.  He is here for a routine follow-up visit  Patient was seen by radiation oncology in May 2023 and Dr. Baruch Gouty had ordered a PET scan in June 2023 which shows findings  concerning for recurrent left upper lobe and left lower lobe tumor.  New hypermetabolic spiculated lesion in the left lower lobe which could be metastatic focus versus metachronous lung cancer.  Stable treated findings in the right upper lobe.  No evidence of mediastinal or hilar adenopathy.  I was unaware of this PET scan that was done.  I will reach out to radiation oncology to see if there would be willing to radiate these 2 areas involving the left upper lobe and lower lobe.  He does not have any evidence of local regional lymph node involvement or distant metastatic disease and recently completed maintenance durvalumab treatment about 3 to 4 months ago.  We will hold off on starting any systemic treatment if radiation oncology can potentially treat this.  Patient has baseline COPD and exertional shortness of breath for which she also follows up with Dr. Patsey Berthold.  Follow-up with me to be decided based on discussion with radiation oncology   Visit Diagnosis 1. Encounter for follow-up surveillance of lung cancer   2. Neoplasm related pain      Dr. Randa Evens, MD, MPH [facility] [phone] 07/10/2022 10:58 AM

## 2022-07-18 ENCOUNTER — Encounter: Payer: Self-pay | Admitting: Radiation Oncology

## 2022-07-18 ENCOUNTER — Ambulatory Visit
Admission: RE | Admit: 2022-07-18 | Discharge: 2022-07-18 | Disposition: A | Discharge status: Home / Self Care | Payer: Medicaid Other | Source: Ambulatory Visit | Attending: Radiation Oncology | Admitting: Radiation Oncology

## 2022-07-18 VITALS — BP 116/78 | HR 72 | Temp 97.0°F | Resp 16 | Ht 73.0 in | Wt 184.3 lb

## 2022-07-18 DIAGNOSIS — Z923 Personal history of irradiation: Secondary | ICD-10-CM | POA: Diagnosis not present

## 2022-07-18 DIAGNOSIS — R918 Other nonspecific abnormal finding of lung field: Secondary | ICD-10-CM | POA: Insufficient documentation

## 2022-07-18 DIAGNOSIS — C3412 Malignant neoplasm of upper lobe, left bronchus or lung: Secondary | ICD-10-CM | POA: Diagnosis not present

## 2022-07-18 DIAGNOSIS — Z08 Encounter for follow-up examination after completed treatment for malignant neoplasm: Secondary | ICD-10-CM | POA: Diagnosis not present

## 2022-07-18 DIAGNOSIS — C3411 Malignant neoplasm of upper lobe, right bronchus or lung: Secondary | ICD-10-CM | POA: Diagnosis not present

## 2022-07-18 DIAGNOSIS — Z9221 Personal history of antineoplastic chemotherapy: Secondary | ICD-10-CM | POA: Diagnosis not present

## 2022-07-18 DIAGNOSIS — C3490 Malignant neoplasm of unspecified part of unspecified bronchus or lung: Secondary | ICD-10-CM

## 2022-07-18 NOTE — Progress Notes (Signed)
Radiation Oncology Follow up Note  Name: Wesley Ferguson   Date:   07/18/2022 MRN:  [id number] DOB: 1966/04/11    This 56 y.o. male presents to the clinic today for over 1 year follow-up status post SBRT as salvage in a patient initially treated with concurrent chemoradiation therapy for stage IIIa (T2 N2 M0) squamous cell carcinoma of the left lung.[name]  REFERRING PROVIDER: No ref. provider found  HPI: We have been following this 56 year old patient who is now out over a year having completed concurrent chemoradiation therapy for stage IIIa squamous cell carcinoma of the left lung.  He also received SBRT to his left lung.  He has developed changes on CT scan of his chest with increasing densities in the left lung fields which PET CT scan were hypermetabolic.  There are 2 nodular densities in the periphery of the left lung which may be recurrent tumor.  I been asked to evaluate the patient for possible further treatment.  He is fairly asymptomatic specifically [place] cough hemoptysis or chest tightness..  COMPLICATIONS OF TREATMENT: none  FOLLOW UP COMPLIANCE: keeps appointments   PHYSICAL EXAM:  BP 116/78 (BP Location: Right Arm, Patient Position: Sitting, Cuff Size: Normal)   Pulse 72   Temp (!) 97 F (36.1 C) (Tympanic)   Resp 16   Ht 6\' 1"  (1.854 m) Comment: Stated HT  Wt 184 lb 4.8 oz (83.6 kg)   BMI 24.32 kg/m  Well-developed well-nourished patient in NAD. HEENT reveals PERLA, EOMI, discs not visualized.  Oral cavity is clear. No oral mucosal lesions are identified. Neck is clear without evidence of cervical or supraclavicular adenopathy. Lungs are clear to A&P. Cardiac examination is essentially unremarkable with regular rate and rhythm without murmur rub or thrill. Abdomen is benign with no organomegaly or masses noted. Motor sensory and DTR levels are equal and symmetric in the upper and lower extremities. Cranial nerves II through XII are grossly intact. Proprioception is intact. No  peripheral adenopathy or edema is identified. No motor or sensory levels are noted. Crude visual fields are within normal range.  RADIOLOGY RESULTS: PET CT scans reviewed compatible with above-stated findings showing area of hypermetabolic activity in the left upper lobe and left lower lobe.  PLAN: This time patient will see Dr. Patsey Berthold this week I would advocate for repeat bronchoscopy to try to determine histology of these 2 lesions as well as the area of what appears to be inflammatory changes in the left lung field.  I have asked to see him back in 2 to 3 weeks after he completes bronchoscopy.  I have contacted Dr. Patsey Berthold about my plan.  Patient comprehends my plan also well.  I would like to take this opportunity to thank you for allowing me to participate in the care of your patient.Noreene Filbert, MD

## 2022-07-20 ENCOUNTER — Ambulatory Visit (INDEPENDENT_AMBULATORY_CARE_PROVIDER_SITE_OTHER): Payer: Medicaid Other | Admitting: Pulmonary Disease

## 2022-07-20 ENCOUNTER — Encounter: Payer: Self-pay | Admitting: Pulmonary Disease

## 2022-07-20 VITALS — BP 126/62 | HR 79 | Temp 97.6°F | Ht 74.0 in | Wt 184.0 lb

## 2022-07-20 DIAGNOSIS — J449 Chronic obstructive pulmonary disease, unspecified: Secondary | ICD-10-CM

## 2022-07-20 DIAGNOSIS — J850 Gangrene and necrosis of lung: Secondary | ICD-10-CM

## 2022-07-20 DIAGNOSIS — C3492 Malignant neoplasm of unspecified part of left bronchus or lung: Secondary | ICD-10-CM

## 2022-07-20 NOTE — Patient Instructions (Signed)
We will going ahead and see you again on 10 October to schedule you at that time for your procedure.  You will have to be evaluated on the 10th as this is what Medicaid/Medicare requires prior to procedures.  Call sooner should any new difficulties arise.

## 2022-07-20 NOTE — Progress Notes (Signed)
Subjective:    Patient ID: Wesley Ferguson, male    DOB: [birth year], 56 y.o.   MRN: [id number] Patient Care Team: Pcp, No as PCP - General Telford Nab, RN as Oncology Nurse Navigator Sindy Guadeloupe, MD as Consulting Physician (Hematology and Oncology)  Chief Complaint  Patient presents with   Follow-up    SOB with exertion and prod cough with white sputum    HPI The patient is a 56 year old current smoker (2 PPD) previously diagnosed with squamous cell carcinoma of the left lung in August 2021.  He underwent chemo XRT.  Scented at that time with a postobstructive pneumonia.  The patient also has stage III COPD by PFTs obtained on 12 May 2021.  At that time his FEV1 was 1.94 L or 44% of predicted.  He has been followed by oncology.  He had been receiving Durvalumab after concurrent chemotherapy and radiation previously.  He has had difficulties procuring inhalers for his COPD he had been controlled with Breztri 2 puffs twice a day but his insurance will not cover this and we had to treat him with Symbicort and Spiriva.  He is now back on Breztri 2 puffs twice a day.  In May 2023 he developed issues with purulent sputum production and chest pain.  He was treated with antibiotics for necrotizing pneumonia.  After appropriate therapy with antibiotics he had a restaging PET/CT that shows suggestion of recurrent left upper lobe superior segment and left lower lobe tumor activity.  He follows here after those findings.  Have advised him that he needs repeat bronchoscopy however he wants to hold off until October because he plans to go on vacation.  I told him that he could have the procedure done well before that but he is adamant that he wants to wait.  Notes that his purulent sputum production has improved.  He has not had any hemoptysis.  Overall he feels "okay" he does not endorse any other symptomatology today.  He is taciturn.  Review of Systems A 10 point review of systems was performed and it  is as noted above otherwise negative.  Patient Active Problem List   Diagnosis Date Noted   Multiple tracheobronchial mucus plugs    Mediastinal adenopathy    Malignant neoplasm of unspecified part of unspecified bronchus or lung ([place]) 08/14/2020   Goals of care, counseling/discussion 08/14/2020   Cavitating mass of upper lobe of left lung 07/20/2020   Nicotine dependence 07/20/2020   Cavitating mass in left upper lung lobe 07/20/2020   Hyponatremia 07/20/2020   Postobstructive pneumonia 07/20/2020   Back pain 11/28/2013   Lumbar radiculopathy 11/28/2013   Social History   Tobacco Use   Smoking status: Every Day    Packs/day: 4.00    Years: 35.00    Total pack years: 140.00    Types: Cigarettes   Smokeless tobacco: Former    Types: Chew   Tobacco comments:    2PPD   Substance Use Topics   Alcohol use: Not Currently    Comment: 1 beer yest and not before 1 month ago   No Known Allergies  Medications reviewed with patient seems not to be too sure about all of his medications.   There is no immunization history on file for this patient.      Objective:   Physical Exam BP 126/62 (BP Location: Left Arm, Cuff Size: Normal)   Pulse 79   Temp 97.6 F (36.4 C) (Temporal)   Ht 6\' 2"  (  1.88 m)   Wt 184 lb (83.5 kg)   SpO2 96%   BMI 23.62 kg/m  GENERAL: Well-developed well-nourished gentleman, no acute distress.  Fully ambulatory.  HEAD: Normocephalic, atraumatic.  EYES: Pupils equal, round, reactive to light.  No scleral icterus.   MOUTH: teeth in poor repair, missing teeth, oral mucosa moist.  No thrush. NECK: Supple. No thyromegaly. Trachea midline. No JVD.  No adenopathy. PULMONARY: Good air entry bilaterally.  Coarse breath sounds, no other adventitious sounds. CARDIOVASCULAR: S1 and S2. Regular rate and rhythm.  No rubs, murmurs or gallops heard. GASTROINTESTINAL: Benign. MUSCULOSKELETAL: No joint deformity, no clubbing, no edema.  NEUROLOGIC: No focal deficit,  fluent speech.  Appears to have mild memory impairment.  No gait disturbance. SKIN: Intact,warm,dry.  Limited exam of skin: No rashes PSYCH: Affect flat, taciturn.      Assessment & Plan:     ICD-10-CM   1. Necrotizing pneumonia (HCC)  J85.0    This process has resolved    2. Squamous cell carcinoma lung, left (HCC)  C34.92    Appears to have recurrent by latest PET/CT Patient wants to defer bronchoscopy till October Adviced not prudent to wait Patient insists on waiting    3. Chronic obstructive pulmonary disease, unspecified COPD type (HCC)  J44.9    Continue Breztri 2 puffs twice a day Continue as needed albuterol     The patient wants to wait till October for his bronchoscopy.  I advised him that this was not a prudent move however he wants to wait.  We will schedule him for follow-up on 10 October for preoperative assessment at that time.  Patient is to contact us prior to his follow-up appointment should any new difficulties arise.  Gailen Shelter, MD Advanced Bronchoscopy PCCM [facility]    *This note was dictated using voice recognition software/Dragon.  Despite best efforts to proofread, errors can occur which can change the meaning. Any transcriptional errors that result from this process are unintentional and may not be fully corrected at the time of dictation.

## 2022-08-10 ENCOUNTER — Ambulatory Visit: Payer: Medicaid Other | Admitting: Radiation Oncology

## 2022-08-24 ENCOUNTER — Ambulatory Visit: Payer: Medicaid Other | Admitting: Pulmonary Disease

## 2022-08-24 ENCOUNTER — Encounter: Payer: Self-pay | Admitting: Pulmonary Disease

## 2022-08-24 VITALS — BP 120/70 | HR 81 | Temp 97.9°F | Ht 72.0 in | Wt 179.0 lb

## 2022-08-24 DIAGNOSIS — J449 Chronic obstructive pulmonary disease, unspecified: Secondary | ICD-10-CM

## 2022-08-24 DIAGNOSIS — J850 Gangrene and necrosis of lung: Secondary | ICD-10-CM

## 2022-08-24 DIAGNOSIS — R0602 Shortness of breath: Secondary | ICD-10-CM | POA: Diagnosis not present

## 2022-08-24 DIAGNOSIS — C3492 Malignant neoplasm of unspecified part of left bronchus or lung: Secondary | ICD-10-CM | POA: Diagnosis not present

## 2022-08-24 DIAGNOSIS — F17210 Nicotine dependence, cigarettes, uncomplicated: Secondary | ICD-10-CM

## 2022-08-24 MED ORDER — BREZTRI AEROSPHERE 160-9-4.8 MCG/ACT IN AERO: 2.0000 | INHALATION_SPRAY | Freq: Two times a day (BID) | RESPIRATORY_TRACT | 0 refills | Status: DC

## 2022-08-24 MED ORDER — ALBUTEROL SULFATE (2.5 MG/3ML) 0.083% IN NEBU: 2.5000 mg | INHALATION_SOLUTION | Freq: Four times a day (QID) | RESPIRATORY_TRACT | 2 refills | Status: DC | PRN

## 2022-08-24 NOTE — Progress Notes (Signed)
Subjective:    Patient ID: Wesley Ferguson, male    DOB: 20-Feb-1966, 56 y.o.   MRN: [id number] Patient Care Team: System, Provider Not In as PCP - General Telford Nab, RN as Oncology Nurse Navigator Sindy Guadeloupe, MD as Consulting Physician (Hematology and Oncology)  Chief Complaint  Patient presents with   Follow-up    SOB with exertion, wheezing and prod cough with clear sputum.    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  We last saw him on 20 July 2022 for consideration of bronchoscopy.  Recall that he underwent chemo XRT for his initial presentation of squamous cell carcinoma.  The initial presentation was that of a postobstructive pneumonia.  The patient also has stage III COPD by PFTs obtained on 12 May 2021.  At that time his FEV1 was 1.94 L or 44% of predicted.  He has been followed by oncology.  He had been receiving Durvalumab after concurrent chemotherapy and radiation previously.  He has had difficulties procuring inhalers for his COPD he had been controlled with Breztri 2 puffs twice a day but his insurance will not cover this and we had to treat him with Symbicort and Spiriva.  He is now back on Breztri 2 puffs twice a day.  In May 2023 he developed issues with purulent sputum production and chest pain.  He was treated with antibiotics for necrotizing pneumonia.  After appropriate therapy with antibiotics he had a restaging PET/CT that shows suggestion of recurrent left upper lobe superior segment and left lower lobe tumor activity.  He follows here after those findings.  Have advised him that he needs repeat bronchoscopy however he wants to hold off until October because he plans to go on vacation.  Again we discussed that he could have the procedure done before his planned trip in October however, he remains adamant that he wants to wait.  He notes notes that his purulent sputum  production has resolved.  He has not had any hemoptysis.  He has chronic dyspnea on exertion.  This has not changed in character.  Overall he feels "okay" he does not endorse any other symptomatology today.    Review of Systems A 10 point review of systems was performed and it is as noted above otherwise negative.  Patient Active Problem List   Diagnosis Date Noted   Multiple tracheobronchial mucus plugs    Mediastinal adenopathy    Malignant neoplasm of unspecified part of unspecified bronchus or lung ([place]) 08/14/2020   Goals of care, counseling/discussion 08/14/2020   Cavitating mass of upper lobe of left lung 07/20/2020   Nicotine dependence 07/20/2020   Cavitating mass in left upper lung lobe 07/20/2020   Hyponatremia 07/20/2020   Postobstructive pneumonia 07/20/2020   Back pain 11/28/2013   Lumbar radiculopathy 11/28/2013   Social History   Tobacco Use   Smoking status: Every Day    Packs/day: 4.00    Years: 35.00    Total pack years: 140.00    Types: Cigarettes   Smokeless tobacco: Former    Types: Chew   Tobacco comments:    2PPD 08/24/2022  Substance Use Topics   Alcohol use: Not Currently    Comment: 1 beer yest and not before 1 month ago   No Known Allergies  Current Meds  Medication Sig   acetaminophen (TYLENOL) 500 MG tablet Take 500-1,000 mg by mouth every 6 (  six) hours as needed (for pain.).   albuterol (VENTOLIN HFA) 108 (90 Base) MCG/ACT inhaler TAKE 2 PUFFS BY MOUTH EVERY 6 HOURS AS NEEDED FOR WHEEZE OR SHORTNESS OF BREATH   Budeson-Glycopyrrol-Formoterol (BREZTRI AEROSPHERE) 160-9-4.8 MCG/ACT AERO Inhale 2 puffs into the lungs in the morning and at bedtime.   levothyroxine (SYNTHROID) 137 MCG tablet Take 1 tablet (137 mcg total) by mouth daily before breakfast.   morphine (MS CONTIN) 30 MG 12 hr tablet Take 1 tablet (30 mg total) by mouth every 12 (twelve) hours.   Oxycodone HCl 10 MG TABS Take 1 tablet (10 mg total) by mouth every 4 (four) hours as  needed.   prochlorperazine (COMPAZINE) 10 MG tablet Take 1 tablet (10 mg total) by mouth every 6 (six) hours as needed for nausea or vomiting.   [DISCONTINUED] budesonide-formoterol (SYMBICORT) 160-4.5 MCG/ACT inhaler Inhale 2 puffs into the lungs in the morning and at bedtime.    There is no immunization history on file for this patient.     Objective:   Physical Exam BP 120/70 (BP Location: Left Arm, Cuff Size: Normal)   Pulse 81   Temp 97.9 F (36.6 C) (Temporal)   Ht 6' (1.829 m)   Wt 179 lb (81.2 kg)   SpO2 97%   BMI 24.28 kg/m  GENERAL: Well-developed, thin gentleman, no acute distress.  Ashen color. Fully ambulatory.  HEAD: Normocephalic, atraumatic.  EYES: Pupils equal, round, reactive to light.  No scleral icterus.   MOUTH: Nose/mouth/throat not examined due to masking requirements for COVID 19. NECK: Supple. No thyromegaly. Trachea midline. No JVD.  No adenopathy. PULMONARY: Good air entry bilaterally.  Coarse breath sounds, no other adventitious sounds. CARDIOVASCULAR: S1 and S2. Regular rate and rhythm.  No rubs, murmurs or gallops heard. GASTROINTESTINAL: Benign. MUSCULOSKELETAL: No joint deformity, no clubbing, no edema.  NEUROLOGIC: No focal deficit, fluent speech.  Appears to have mild memory impairment.  No gait disturbance. SKIN: Intact,warm,dry.  Limited exam of skin: No rashes PSYCH: Affect flat, behavior normal.   Ambulatory oximetry: Patient did not exhibit oxygen desaturations doing ambulatory oximetry oxygen saturations remained at 98 to 96%.  Did have some mild to moderate dyspnea.    Assessment & Plan:     ICD-10-CM   1. Squamous cell carcinoma lung, left (HCC)  C34.92    Likely has recurrence Declines bronchoscopy until October    2. Necrotizing pneumonia ([place])  J85.0    Resolved    3. Chronic obstructive pulmonary disease, unspecified COPD type (HCC)  J44.9    Continue Breztri 2 puffs twice a day STOP SMOKING    4. Shortness of breath   R06.02    No evidence of desaturations with ambulation Suspect multifactorial Deconditioning, cancer, COPD, ongoing tobacco use    5. Tobacco dependence due to cigarettes  F17.210    Patient counseled regards to discontinuation of smoking Stressed the importance of quitting smoking      Meds ordered this encounter  Medications   Budeson-Glycopyrrol-Formoterol (BREZTRI AEROSPHERE) 160-9-4.8 MCG/ACT AERO    Sig: Inhale 2 puffs into the lungs in the morning and at bedtime.    Dispense:  5.9 g    Refill:  0    Order Specific Question:   Lot Number?    Answer:   [id number]    Order Specific Question:   Expiration Date?    Answer:   12/26/2024    Order Specific Question:   Manufacturer?    Answer:   [employer] [[id number]]  Order Specific Question:   Quantity    Answer:   3   We will follow-up with the patient in 4 to 6 weeks time.  If he decides to have the bronchoscopy prior to October he is to let us know.  Renold Don, MD Advanced Bronchoscopy PCCM [facility]    *This note was dictated using voice recognition software/Dragon.  Despite best efforts to proofread, errors can occur which can change the meaning. Any transcriptional errors that result from this process are unintentional and may not be fully corrected at the time of dictation.

## 2022-08-24 NOTE — Patient Instructions (Signed)
As we discussed today you still wished to postpone your bronchoscopy.  We are giving you samples of Breztri that is 2 puffs twice a day.  It is very important that you quit smoking.  We will see you in follow-up in 4 to 6 weeks time call sooner should any new problems arise.

## 2022-08-25 ENCOUNTER — Other Ambulatory Visit: Payer: Self-pay | Admitting: Oncology

## 2022-08-26 ENCOUNTER — Encounter: Payer: Self-pay | Admitting: Oncology

## 2022-08-26 MED ORDER — OXYCODONE HCL 10 MG PO TABS: 10.0000 mg | ORAL_TABLET | ORAL | 0 refills | Status: DC | PRN

## 2022-10-04 ENCOUNTER — Encounter: Payer: Self-pay | Admitting: Pulmonary Disease

## 2022-10-04 ENCOUNTER — Ambulatory Visit (INDEPENDENT_AMBULATORY_CARE_PROVIDER_SITE_OTHER): Payer: Medicaid Other | Admitting: Pulmonary Disease

## 2022-10-04 ENCOUNTER — Telehealth: Payer: Self-pay

## 2022-10-04 VITALS — BP 110/78 | HR 75 | Temp 98.3°F | Ht 72.0 in | Wt 181.2 lb

## 2022-10-04 DIAGNOSIS — C3492 Malignant neoplasm of unspecified part of left bronchus or lung: Secondary | ICD-10-CM

## 2022-10-04 DIAGNOSIS — J449 Chronic obstructive pulmonary disease, unspecified: Secondary | ICD-10-CM | POA: Diagnosis not present

## 2022-10-04 DIAGNOSIS — F17210 Nicotine dependence, cigarettes, uncomplicated: Secondary | ICD-10-CM

## 2022-10-04 DIAGNOSIS — J984 Other disorders of lung: Secondary | ICD-10-CM

## 2022-10-04 NOTE — Telephone Encounter (Signed)
Regular bronchoscopy with navigation and cellvizio scheduled 10/21/2022 at 12:30. [id number] XL:[id number] nodule.  Rodena Piety, please see bronch info. Thanks

## 2022-10-04 NOTE — Patient Instructions (Signed)
We will get a special scan of the chest prior to your procedure.  Your procedure has been scheduled for 27 October at 12:30 PM.  You will need to have someone drive you that day.  We will see you in follow-up in 4 to 6 weeks time call sooner should any new problems arise.

## 2022-10-05 ENCOUNTER — Encounter: Payer: Self-pay | Admitting: Pulmonary Disease

## 2022-10-05 NOTE — H&P (View-Only) (Signed)
Subjective:    Patient ID: Wesley Ferguson, male    DOB: 02/15/1966, 56 y.o.   MRN: [id number] Patient Care Team: System, Provider Not In as PCP - General Telford Nab, RN as Oncology Nurse Navigator Sindy Guadeloupe, MD as Consulting Physician (Hematology and Oncology)  Chief Complaint  Patient presents with   Follow-up    Constant SOB. No wheezing. Coughs up clear sputum.    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  Patient had concurrent chemoradiation with CarboTaxol followed by maintenance durvalumab.  NGS testing showed high tumor mutational burden.  In May 2022 surveillance CT scans and PET/CT showed hypermetabolic nodules in the left upper lobe and new hypermetabolic cavitary nodule in the right lower lobe concerning for disease progression.  No evidence of distant metastatic disease.  Patient received SBRT to the left lung with plans to continue monitoring on the right upper lobe lung nodule.  Patient completed maintenance durvalumab in March 2023.  In the interim, in May 2023 he developed issues with purulent sputum production and chest pain.  He was treated with 4 a necrotizing pneumonia.  Subsequently after being treated for that ,in June 2023, he had a restaging PET/CT that showed suggestion of recurrent left upper lobe superior segment and left lower lobe tumor activity.  Were also potential surrounding inflammatory or infectious changes.  There was a treated right upper lobe lesion without evidence of residual or recurrent tumor it was no mediastinal hilar metastatic lymphadenopathy.  After this PET/CT bronchoscopy was offered to the patient during July and August however he wanted to wait till October due to plans to go on vacation.  Today he returns after having had his family vacation and wants to schedule bronchoscopy.  He has chronic morning cough  which he states is productive of yellowish sputum.  No recent hemoptysis.  No weight loss or anorexia.  He does complain of fatigue.  No shortness of breath per se.  No chest pain.  Does not endorse lower extremity edema or calf tenderness.  He is compliant with Judithann Sauger and this helps him with his COPD symptoms.  He unfortunately continues to smoke and is up to 2 packs of cigarettes per day.  He understands the potential benefits, limitations and complications of bronchoscopy.  This will need to be done under general anesthesia.  He understands.  Review of Systems A 10 point review of systems was performed and it is as noted above otherwise negative.  Patient Active Problem List   Diagnosis Date Noted   Malignant neoplasm of unspecified part of unspecified bronchus or lung ([place]) 08/14/2020   Goals of care, counseling/discussion 08/14/2020   Cavitating mass of upper lobe of left lung 07/20/2020   Nicotine dependence 07/20/2020   Cavitating mass in left upper lung lobe 07/20/2020   Hyponatremia 07/20/2020   Postobstructive pneumonia 07/20/2020   Back pain 11/28/2013   Lumbar radiculopathy 11/28/2013   Social History   Tobacco Use   Smoking status: Every Day    Packs/day: 4.00    Years: 35.00    Total pack years: 140.00    Types: Cigarettes   Smokeless tobacco: Former    Types: Chew   Tobacco comments:    2PPD 08/24/2022    2 PPD 10/04/2022- Fabian Sharp, CMa  Substance Use Topics   Alcohol use: Not Currently  Comment: 1 beer yest and not before 1 month ago   No Known Allergies  Current Meds  Medication Sig   acetaminophen (TYLENOL) 500 MG tablet Take 500-1,000 mg by mouth every 6 (six) hours as needed (for pain.).   albuterol (PROVENTIL) (2.5 MG/3ML) 0.083% nebulizer solution Take 3 mLs (2.5 mg total) by nebulization every 6 (six) hours as needed for wheezing or shortness of breath.   albuterol (VENTOLIN HFA) 108 (90 Base) MCG/ACT inhaler TAKE 2 PUFFS BY MOUTH EVERY 6  HOURS AS NEEDED FOR WHEEZE OR SHORTNESS OF BREATH   Budeson-Glycopyrrol-Formoterol (BREZTRI AEROSPHERE) 160-9-4.8 MCG/ACT AERO Inhale 2 puffs into the lungs in the morning and at bedtime.   morphine (MS CONTIN) 30 MG 12 hr tablet Take 1 tablet (30 mg total) by mouth every 12 (twelve) hours.   Oxycodone HCl 10 MG TABS Take 1 tablet (10 mg total) by mouth every 4 (four) hours as needed.   prochlorperazine (COMPAZINE) 10 MG tablet Take 1 tablet (10 mg total) by mouth every 6 (six) hours as needed for nausea or vomiting.    There is no immunization history on file for this patient.     Objective:   Physical Exam BP 110/78 (BP Location: Left Arm, Cuff Size: Normal)   Pulse 75   Temp 98.3 F (36.8 C)   Ht 6' (1.829 m)   Wt 181 lb 3.2 oz (82.2 kg)   SpO2 96%   BMI 24.58 kg/m  GENERAL: Well-developed, well-nourished gentleman, no acute distress.  Appears chronically ill.  Pale. Fully ambulatory.  HEAD: Normocephalic, atraumatic.  EYES: Pupils equal, round, reactive to light.  No scleral icterus.   MOUTH: Very poor dentition, oral mucosa moist.  No thrush. NECK: Supple. No thyromegaly. Trachea midline. No JVD.  No adenopathy. PULMONARY: Good air entry bilaterally.  Coarse breath sounds, no other adventitious sounds. CARDIOVASCULAR: S1 and S2. Regular rate and rhythm.  No rubs, murmurs or gallops heard. GASTROINTESTINAL: Benign. MUSCULOSKELETAL: No joint deformity, no clubbing, no edema.  NEUROLOGIC: No focal deficit, fluent speech.  Appears to have mild memory impairment enthesis chronic). No gait disturbance. SKIN: Intact,warm,dry.  Limited exam of skin: No rashes PSYCH: Affect flat, behavior normal.       Assessment & Plan:     ICD-10-CM   1. Cavitating mass of upper lobe of left lung  J98.4 CT SUPER D CHEST WO MONARCH PILOT   Increase activity on the known LUL mass Left lower lobe mass also noted Patient needs bronchoscopy for diagnosis Suspect recurrent cancer    2. Chronic  obstructive pulmonary disease, unspecified COPD type ([place])  J44.9    Compliant with Breztri Rare use of albuterol No recent flare    3. Squamous cell carcinoma lung, left (HCC)  C34.92    Has received concurrent chemoradiation Completed durvalumab Suspect recurrence    4. Tobacco dependence due to cigarettes  F17.210    This issue adds complexity to his management Patient counseled regards discontinuation of smoking     Orders Placed This Encounter  Procedures   CT SUPER D CHEST WO MONARCH PILOT    Prior to Bronchoscopy on 10/21/2022.    Standing Status:   Future    Standing Expiration Date:   10/05/2023    Order Specific Question:   Preferred imaging location?    Answer:   [facility]   Patient will have bronchoscopy on 21 October 2022.  We will use computer-assisted navigation with Body Vision if necessary.  Fluoroscopy will also  be utilized.  Benefits, limitations and potential complications of the procedure were discussed with the patient/family.  Complications from bronchoscopy are rare and most often minor, but if they occur they may include breathing difficulty, vocal cord spasm, hoarseness, slight fever, vomiting, dizziness, bronchospasm, infection, low blood oxygen, bleeding from biopsy site, or an allergic reaction to medications.  It is uncommon for patients to experience other more serious complications for example: Collapsed lung requiring chest tube placement, respiratory failure, heart attack and/or cardiac arrhythmia.  Patient agrees to proceed.  Renold Don, MD Advanced Bronchoscopy PCCM [facility]    *This note was dictated using voice recognition software/Dragon.  Despite best efforts to proofread, errors can occur which can change the meaning. Any transcriptional errors that result from this process are unintentional and may not be fully corrected at the time of dictation.

## 2022-10-05 NOTE — Progress Notes (Signed)
Subjective:    Patient ID: Wesley Ferguson, male    DOB: 02/15/1966, 56 y.o.   MRN: [id number] Patient Care Team: System, Provider Not In as PCP - General Telford Nab, RN as Oncology Nurse Navigator Sindy Guadeloupe, MD as Consulting Physician (Hematology and Oncology)  Chief Complaint  Patient presents with   Follow-up    Constant SOB. No wheezing. Coughs up clear sputum.    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  Patient had concurrent chemoradiation with CarboTaxol followed by maintenance durvalumab.  NGS testing showed high tumor mutational burden.  In May 2022 surveillance CT scans and PET/CT showed hypermetabolic nodules in the left upper lobe and new hypermetabolic cavitary nodule in the right lower lobe concerning for disease progression.  No evidence of distant metastatic disease.  Patient received SBRT to the left lung with plans to continue monitoring on the right upper lobe lung nodule.  Patient completed maintenance durvalumab in March 2023.  In the interim, in May 2023 he developed issues with purulent sputum production and chest pain.  He was treated with 4 a necrotizing pneumonia.  Subsequently after being treated for that ,in June 2023, he had a restaging PET/CT that showed suggestion of recurrent left upper lobe superior segment and left lower lobe tumor activity.  Were also potential surrounding inflammatory or infectious changes.  There was a treated right upper lobe lesion without evidence of residual or recurrent tumor it was no mediastinal hilar metastatic lymphadenopathy.  After this PET/CT bronchoscopy was offered to the patient during July and August however he wanted to wait till October due to plans to go on vacation.  Today he returns after having had his family vacation and wants to schedule bronchoscopy.  He has chronic morning cough  which he states is productive of yellowish sputum.  No recent hemoptysis.  No weight loss or anorexia.  He does complain of fatigue.  No shortness of breath per se.  No chest pain.  Does not endorse lower extremity edema or calf tenderness.  He is compliant with Judithann Sauger and this helps him with his COPD symptoms.  He unfortunately continues to smoke and is up to 2 packs of cigarettes per day.  He understands the potential benefits, limitations and complications of bronchoscopy.  This will need to be done under general anesthesia.  He understands.  Review of Systems A 10 point review of systems was performed and it is as noted above otherwise negative.  Patient Active Problem List   Diagnosis Date Noted   Malignant neoplasm of unspecified part of unspecified bronchus or lung ([place]) 08/14/2020   Goals of care, counseling/discussion 08/14/2020   Cavitating mass of upper lobe of left lung 07/20/2020   Nicotine dependence 07/20/2020   Cavitating mass in left upper lung lobe 07/20/2020   Hyponatremia 07/20/2020   Postobstructive pneumonia 07/20/2020   Back pain 11/28/2013   Lumbar radiculopathy 11/28/2013   Social History   Tobacco Use   Smoking status: Every Day    Packs/day: 4.00    Years: 35.00    Total pack years: 140.00    Types: Cigarettes   Smokeless tobacco: Former    Types: Chew   Tobacco comments:    2PPD 08/24/2022    2 PPD 10/04/2022- Fabian Sharp, CMa  Substance Use Topics   Alcohol use: Not Currently  Comment: 1 beer yest and not before 1 month ago   No Known Allergies  Current Meds  Medication Sig   acetaminophen (TYLENOL) 500 MG tablet Take 500-1,000 mg by mouth every 6 (six) hours as needed (for pain.).   albuterol (PROVENTIL) (2.5 MG/3ML) 0.083% nebulizer solution Take 3 mLs (2.5 mg total) by nebulization every 6 (six) hours as needed for wheezing or shortness of breath.   albuterol (VENTOLIN HFA) 108 (90 Base) MCG/ACT inhaler TAKE 2 PUFFS BY MOUTH EVERY 6  HOURS AS NEEDED FOR WHEEZE OR SHORTNESS OF BREATH   Budeson-Glycopyrrol-Formoterol (BREZTRI AEROSPHERE) 160-9-4.8 MCG/ACT AERO Inhale 2 puffs into the lungs in the morning and at bedtime.   morphine (MS CONTIN) 30 MG 12 hr tablet Take 1 tablet (30 mg total) by mouth every 12 (twelve) hours.   Oxycodone HCl 10 MG TABS Take 1 tablet (10 mg total) by mouth every 4 (four) hours as needed.   prochlorperazine (COMPAZINE) 10 MG tablet Take 1 tablet (10 mg total) by mouth every 6 (six) hours as needed for nausea or vomiting.    There is no immunization history on file for this patient.     Objective:   Physical Exam BP 110/78 (BP Location: Left Arm, Cuff Size: Normal)   Pulse 75   Temp 98.3 F (36.8 C)   Ht 6' (1.829 m)   Wt 181 lb 3.2 oz (82.2 kg)   SpO2 96%   BMI 24.58 kg/m  GENERAL: Well-developed, well-nourished gentleman, no acute distress.  Appears chronically ill.  Pale. Fully ambulatory.  HEAD: Normocephalic, atraumatic.  EYES: Pupils equal, round, reactive to light.  No scleral icterus.   MOUTH: Very poor dentition, oral mucosa moist.  No thrush. NECK: Supple. No thyromegaly. Trachea midline. No JVD.  No adenopathy. PULMONARY: Good air entry bilaterally.  Coarse breath sounds, no other adventitious sounds. CARDIOVASCULAR: S1 and S2. Regular rate and rhythm.  No rubs, murmurs or gallops heard. GASTROINTESTINAL: Benign. MUSCULOSKELETAL: No joint deformity, no clubbing, no edema.  NEUROLOGIC: No focal deficit, fluent speech.  Appears to have mild memory impairment enthesis chronic). No gait disturbance. SKIN: Intact,warm,dry.  Limited exam of skin: No rashes PSYCH: Affect flat, behavior normal.       Assessment & Plan:     ICD-10-CM   1. Cavitating mass of upper lobe of left lung  J98.4 CT SUPER D CHEST WO MONARCH PILOT   Increase activity on the known LUL mass Left lower lobe mass also noted Patient needs bronchoscopy for diagnosis Suspect recurrent cancer    2. Chronic  obstructive pulmonary disease, unspecified COPD type (HCC)  J44.9    Compliant with Breztri Rare use of albuterol No recent flare    3. Squamous cell carcinoma lung, left (HCC)  C34.92    Has received concurrent chemoradiation Completed durvalumab Suspect recurrence    4. Tobacco dependence due to cigarettes  F17.210    This issue adds complexity to his management Patient counseled regards discontinuation of smoking     Orders Placed This Encounter  Procedures   CT SUPER D CHEST WO MONARCH PILOT    Prior to Bronchoscopy on 10/21/2022.    Standing Status:   Future    Standing Expiration Date:   10/05/2023    Order Specific Question:   Preferred imaging location?    Answer:   [facility]   Patient will have bronchoscopy on 21 October 2022.  We will use computer-assisted navigation with Body Vision if necessary.  Fluoroscopy will also   be utilized.  Benefits, limitations and potential complications of the procedure were discussed with the patient/family.  Complications from bronchoscopy are rare and most often minor, but if they occur they may include breathing difficulty, vocal cord spasm, hoarseness, slight fever, vomiting, dizziness, bronchospasm, infection, low blood oxygen, bleeding from biopsy site, or an allergic reaction to medications.  It is uncommon for patients to experience other more serious complications for example: Collapsed lung requiring chest tube placement, respiratory failure, heart attack and/or cardiac arrhythmia.  Patient agrees to proceed.  Renold Don, MD Advanced Bronchoscopy PCCM [facility]    *This note was dictated using voice recognition software/Dragon.  Despite best efforts to proofread, errors can occur which can change the meaning. Any transcriptional errors that result from this process are unintentional and may not be fully corrected at the time of dictation.

## 2022-10-05 NOTE — Telephone Encounter (Signed)
Phone pre admit visit 10/14/2022 between 1-5 and covid test 10/19/2022 at 10:00. ATC patient--unable to leave vm due to mailbox not being setup.  Will call back.

## 2022-10-06 ENCOUNTER — Other Ambulatory Visit: Payer: Self-pay | Admitting: Oncology

## 2022-10-06 MED ORDER — OXYCODONE HCL 10 MG PO TABS: 10.0000 mg | ORAL_TABLET | ORAL | 0 refills | Status: DC | PRN

## 2022-10-06 NOTE — Telephone Encounter (Signed)
Patient is aware of below dates/times and voiced his understanding.  Nothing further needed.

## 2022-10-06 NOTE — Telephone Encounter (Signed)
For the code [id number] Prior Auth Not Required Refer # [id number]

## 2022-10-13 ENCOUNTER — Encounter: Payer: Self-pay | Admitting: Oncology

## 2022-10-14 ENCOUNTER — Encounter
Admission: RE | Admit: 2022-10-14 | Discharge: 2022-10-14 | Disposition: A | Discharge status: Home / Self Care | Payer: Medicaid Other | Source: Ambulatory Visit | Attending: Pulmonary Disease | Admitting: Pulmonary Disease

## 2022-10-14 ENCOUNTER — Encounter: Payer: Self-pay | Admitting: Pulmonary Disease

## 2022-10-14 ENCOUNTER — Other Ambulatory Visit: Payer: Self-pay

## 2022-10-14 NOTE — Pre-Procedure Instructions (Signed)
Unable to reach patient. Phone not set up for voice mail. Daughter Wesley Ferguson available and handles most of [name] medical needs. Reviewed as much information as possible with her for this appt and provided her with verbal instructions which she stated she understood. She also has access to [name] my chart account and is aware she can find them there as well. Requested that she make an accurate list of his medications and have him bring those the day of his procedure.

## 2022-10-14 NOTE — Patient Instructions (Signed)
Your procedure is scheduled on: 10/21/22 Report to [facility]. To find out your arrival time please call [phone] between 1PM - 3PM on 10/20/22.  Remember: Instructions that are not followed completely may result in serious medical risk, up to and including death, or upon the discretion of your surgeon and anesthesiologist your surgery may need to be rescheduled.     _X__ 1. Do not eat food or drink any liquis after midnight the night before your procedure.                 No gum chewing or hard candies.   __X__2.  On the morning of surgery brush your teeth with toothpaste and water, you                 may rinse your mouth with mouthwash if you wish.  Do not swallow any              toothpaste of mouthwash.     _X__ 3.  No Alcohol for 24 hours before or after surgery.   _X__ 4.  Do Not Smoke or use e-cigarettes For 24 Hours Prior to Your Surgery.                 Do not use any chewable tobacco products for at least 6 hours prior to                 surgery.  ____  5.  Bring all medications with you on the day of surgery if instructed.   __X__  6.  Notify your doctor if there is any change in your medical condition      (cold, fever, infections).     Do not wear jewelry, make-up, hairpins, clips or nail polish. Do not wear lotions, powders, or perfumes. You may wear deodorant Do not shave body hair 48 hours prior to surgery. Men may shave face and neck. Do not bring valuables to the hospital.    [facility] is not responsible for any belongings or valuables.  Contacts, dentures/partials or body piercings may not be worn into surgery. Bring a case for your contacts, glasses or hearing aids, a denture cup will be supplied. Leave your suitcase in the car. After surgery it may be brought to your room. For patients admitted to the hospital, discharge time is determined by your treatment team.   Patients discharged the day of  surgery will not be allowed to drive home.    __X__ Take these medicines the morning of surgery with A SIP OF WATER:    1. Oxycodone HCl 10 MG TABS if needed  2.   3.   4.  5.  6.  ____ Fleet Enema (as directed)   __X__ Use CHG Soap/SAGE wipes as directed  __X__ Use inhalers on the day of surgery  Use your nebulizer and your Breztri inhaler. Bring your Albuterol with your to your procedure.  ____ Stop metformin/Janumet/Farxiga 2 days prior to surgery    ____ Take 1/2 of usual insulin dose the night before surgery. No insulin the morning          of surgery.   ____ Stop Blood Thinners Coumadin/Plavix/Xarelto/Pleta/Pradaxa/Eliquis/Effient/Aspirin  on   Or contact your Surgeon, Cardiologist or Medical Doctor regarding  ability to stop your blood thinners  __X__ Stop Anti-inflammatories 7 days before surgery such as Advil, Ibuprofen, Motrin,  BC or Goodies Powder, Naprosyn, Naproxen, Aleve, Aspirin  __X__ Stop all herbals and supplements, fish oil or vitamins for 7 days until after surgery.    ____ Bring C-Pap to the hospital.

## 2022-10-18 ENCOUNTER — Ambulatory Visit
Admission: RE | Admit: 2022-10-18 | Discharge: 2022-10-18 | Disposition: A | Discharge status: Home / Self Care | Payer: Medicaid Other | Source: Ambulatory Visit | Attending: Pulmonary Disease | Admitting: Pulmonary Disease

## 2022-10-18 DIAGNOSIS — J439 Emphysema, unspecified: Secondary | ICD-10-CM | POA: Diagnosis not present

## 2022-10-18 DIAGNOSIS — R918 Other nonspecific abnormal finding of lung field: Secondary | ICD-10-CM | POA: Diagnosis not present

## 2022-10-18 DIAGNOSIS — J984 Other disorders of lung: Secondary | ICD-10-CM | POA: Insufficient documentation

## 2022-10-19 ENCOUNTER — Encounter
Admission: RE | Admit: 2022-10-19 | Discharge: 2022-10-19 | Disposition: A | Discharge status: Home / Self Care | Payer: Medicaid Other | Source: Ambulatory Visit | Attending: Pulmonary Disease | Admitting: Pulmonary Disease

## 2022-10-19 DIAGNOSIS — Z01812 Encounter for preprocedural laboratory examination: Secondary | ICD-10-CM | POA: Diagnosis present

## 2022-10-19 DIAGNOSIS — Z1152 Encounter for screening for COVID-19: Secondary | ICD-10-CM | POA: Insufficient documentation

## 2022-10-20 LAB — SARS CORONAVIRUS 2 (TAT 6-24 HRS): SARS Coronavirus 2: NEGATIVE

## 2022-10-21 ENCOUNTER — Other Ambulatory Visit: Payer: Self-pay

## 2022-10-21 ENCOUNTER — Ambulatory Visit: Payer: Medicaid Other | Admitting: Certified Registered Nurse Anesthetist

## 2022-10-21 ENCOUNTER — Ambulatory Visit: Payer: Medicaid Other

## 2022-10-21 ENCOUNTER — Encounter: Payer: Self-pay | Admitting: Pulmonary Disease

## 2022-10-21 ENCOUNTER — Encounter
Admission: RE | Disposition: A | Discharge status: Home / Self Care | Payer: Self-pay | Source: Ambulatory Visit | Attending: Pulmonary Disease

## 2022-10-21 ENCOUNTER — Ambulatory Visit
Admission: RE | Admit: 2022-10-21 | Discharge: 2022-10-21 | Disposition: A | Discharge status: Home / Self Care | Payer: Medicaid Other | Source: Ambulatory Visit | Attending: Pulmonary Disease | Admitting: Pulmonary Disease

## 2022-10-21 DIAGNOSIS — J439 Emphysema, unspecified: Secondary | ICD-10-CM | POA: Insufficient documentation

## 2022-10-21 DIAGNOSIS — J449 Chronic obstructive pulmonary disease, unspecified: Secondary | ICD-10-CM | POA: Diagnosis not present

## 2022-10-21 DIAGNOSIS — Z08 Encounter for follow-up examination after completed treatment for malignant neoplasm: Secondary | ICD-10-CM

## 2022-10-21 DIAGNOSIS — C3412 Malignant neoplasm of upper lobe, left bronchus or lung: Secondary | ICD-10-CM | POA: Diagnosis not present

## 2022-10-21 DIAGNOSIS — T17800A Unspecified foreign body in other parts of respiratory tract causing asphyxiation, initial encounter: Secondary | ICD-10-CM

## 2022-10-21 DIAGNOSIS — C3490 Malignant neoplasm of unspecified part of unspecified bronchus or lung: Secondary | ICD-10-CM

## 2022-10-21 DIAGNOSIS — R0602 Shortness of breath: Secondary | ICD-10-CM | POA: Diagnosis present

## 2022-10-21 DIAGNOSIS — J984 Other disorders of lung: Secondary | ICD-10-CM | POA: Diagnosis not present

## 2022-10-21 DIAGNOSIS — R590 Localized enlarged lymph nodes: Secondary | ICD-10-CM | POA: Diagnosis not present

## 2022-10-21 DIAGNOSIS — R918 Other nonspecific abnormal finding of lung field: Secondary | ICD-10-CM | POA: Diagnosis not present

## 2022-10-21 DIAGNOSIS — F17210 Nicotine dependence, cigarettes, uncomplicated: Secondary | ICD-10-CM | POA: Diagnosis not present

## 2022-10-21 DIAGNOSIS — Z9889 Other specified postprocedural states: Secondary | ICD-10-CM | POA: Diagnosis not present

## 2022-10-21 HISTORY — PX: ELECTROMAGNETIC NAVIGATION BROCHOSCOPY: SHX5369

## 2022-10-21 SURGERY — ELECTROMAGNETIC NAVIGATION BRONCHOSCOPY
Anesthesia: General | Laterality: Left

## 2022-10-21 MED ORDER — SUGAMMADEX SODIUM 500 MG/5ML IV SOLN
INTRAVENOUS | Status: AC
  Filled 2022-10-21: qty 5

## 2022-10-21 MED ORDER — CHLORHEXIDINE GLUCONATE 0.12 % MT SOLN
OROMUCOSAL | Status: AC
  Administered 2022-10-21: 15 mL via OROMUCOSAL
  Filled 2022-10-21: qty 15

## 2022-10-21 MED ORDER — DEXAMETHASONE SODIUM PHOSPHATE 10 MG/ML IJ SOLN
INTRAMUSCULAR | Status: AC
  Filled 2022-10-21: qty 1

## 2022-10-21 MED ORDER — GLYCOPYRROLATE 0.2 MG/ML IJ SOLN
INTRAMUSCULAR | Status: DC | PRN
  Administered 2022-10-21: .2 mg via INTRAVENOUS

## 2022-10-21 MED ORDER — ROCURONIUM BROMIDE 10 MG/ML (PF) SYRINGE
PREFILLED_SYRINGE | INTRAVENOUS | Status: AC
  Filled 2022-10-21: qty 10

## 2022-10-21 MED ORDER — IPRATROPIUM-ALBUTEROL 0.5-2.5 (3) MG/3ML IN SOLN
3.0000 mL | Freq: Once | RESPIRATORY_TRACT | Status: AC
  Administered 2022-10-21: 3 mL via RESPIRATORY_TRACT

## 2022-10-21 MED ORDER — IPRATROPIUM-ALBUTEROL 0.5-2.5 (3) MG/3ML IN SOLN
RESPIRATORY_TRACT | Status: AC
  Filled 2022-10-21: qty 3

## 2022-10-21 MED ORDER — LIDOCAINE HCL (CARDIAC) PF 100 MG/5ML IV SOSY
PREFILLED_SYRINGE | INTRAVENOUS | Status: DC | PRN
  Administered 2022-10-21: 100 mg via INTRAVENOUS

## 2022-10-21 MED ORDER — PHENYLEPHRINE HCL (PRESSORS) 10 MG/ML IV SOLN
INTRAVENOUS | Status: DC | PRN
  Administered 2022-10-21: 80 ug via INTRAVENOUS
  Administered 2022-10-21 (×3): 160 ug via INTRAVENOUS

## 2022-10-21 MED ORDER — OXYCODONE HCL 5 MG/5ML PO SOLN: 5.0000 mg | Freq: Once | ORAL | Status: DC | PRN

## 2022-10-21 MED ORDER — SUGAMMADEX SODIUM 200 MG/2ML IV SOLN
INTRAVENOUS | Status: DC | PRN
  Administered 2022-10-21: 330 mg via INTRAVENOUS

## 2022-10-21 MED ORDER — SODIUM CHLORIDE 0.9 % IV SOLN: INTRAVENOUS | Status: DC | PRN

## 2022-10-21 MED ORDER — ROCURONIUM BROMIDE 100 MG/10ML IV SOLN
INTRAVENOUS | Status: DC | PRN
  Administered 2022-10-21: 50 mg via INTRAVENOUS
  Administered 2022-10-21: 20 mg via INTRAVENOUS

## 2022-10-21 MED ORDER — CHLORHEXIDINE GLUCONATE 0.12 % MT SOLN: 15.0000 mL | Freq: Once | OROMUCOSAL | Status: AC

## 2022-10-21 MED ORDER — PROPOFOL 10 MG/ML IV BOLUS
INTRAVENOUS | Status: AC
  Filled 2022-10-21: qty 20

## 2022-10-21 MED ORDER — PHENYLEPHRINE 80 MCG/ML (10ML) SYRINGE FOR IV PUSH (FOR BLOOD PRESSURE SUPPORT)
PREFILLED_SYRINGE | INTRAVENOUS | Status: AC
  Filled 2022-10-21: qty 10

## 2022-10-21 MED ORDER — FAMOTIDINE 20 MG PO TABS: 20.0000 mg | ORAL_TABLET | Freq: Once | ORAL | Status: AC

## 2022-10-21 MED ORDER — FAMOTIDINE 20 MG PO TABS
ORAL_TABLET | ORAL | Status: AC
  Administered 2022-10-21: 20 mg via ORAL
  Filled 2022-10-21: qty 1

## 2022-10-21 MED ORDER — GLYCOPYRROLATE 0.2 MG/ML IJ SOLN
INTRAMUSCULAR | Status: AC
  Filled 2022-10-21: qty 1

## 2022-10-21 MED ORDER — ORAL CARE MOUTH RINSE: 15.0000 mL | Freq: Once | OROMUCOSAL | Status: AC

## 2022-10-21 MED ORDER — SODIUM CHLORIDE 0.9 % IV SOLN
Freq: Once | INTRAVENOUS | Status: AC
  Administered 2022-10-21: 10 mL via INTRAVENOUS

## 2022-10-21 MED ORDER — OXYCODONE HCL 5 MG PO TABS: 5.0000 mg | ORAL_TABLET | Freq: Once | ORAL | Status: DC | PRN

## 2022-10-21 MED ORDER — DEXAMETHASONE SODIUM PHOSPHATE 10 MG/ML IJ SOLN
INTRAMUSCULAR | Status: DC | PRN
  Administered 2022-10-21: 10 mg via INTRAVENOUS

## 2022-10-21 MED ORDER — ONDANSETRON HCL 4 MG/2ML IJ SOLN
INTRAMUSCULAR | Status: AC
  Filled 2022-10-21: qty 2

## 2022-10-21 MED ORDER — FENTANYL CITRATE (PF) 100 MCG/2ML IJ SOLN
INTRAMUSCULAR | Status: AC
  Filled 2022-10-21: qty 2

## 2022-10-21 MED ORDER — FENTANYL CITRATE (PF) 100 MCG/2ML IJ SOLN: 25.0000 ug | INTRAMUSCULAR | Status: DC | PRN

## 2022-10-21 MED ORDER — FENTANYL CITRATE (PF) 100 MCG/2ML IJ SOLN
INTRAMUSCULAR | Status: DC | PRN
  Administered 2022-10-21 (×2): 50 ug via INTRAVENOUS

## 2022-10-21 MED ORDER — LIDOCAINE HCL (PF) 2 % IJ SOLN
INTRAMUSCULAR | Status: AC
  Filled 2022-10-21: qty 5

## 2022-10-21 MED ORDER — PROPOFOL 10 MG/ML IV BOLUS
INTRAVENOUS | Status: DC | PRN
  Administered 2022-10-21: 150 mg via INTRAVENOUS

## 2022-10-21 NOTE — Transfer of Care (Signed)
Immediate Anesthesia Transfer of Care Note  Patient: Wesley Ferguson  Procedure(s) Performed: ELECTROMAGNETIC NAVIGATION BRONCHOSCOPY (Left)  Patient Location: PACU  Anesthesia Type:General  Level of Consciousness: drowsy and patient cooperative  Airway & Oxygen Therapy: Patient Spontanous Breathing and Patient connected to face mask oxygen  Post-op Assessment: Report given to RN and Post -op Vital signs reviewed and stable  Post vital signs: Reviewed and stable  Last Vitals:  Vitals Value Taken Time  BP 114/75 10/21/22 1330  Temp 36.3 C 10/21/22 1326  Pulse 87 10/21/22 1332  Resp 19 10/21/22 1332  SpO2 100 % 10/21/22 1332  Vitals shown include unvalidated device data.  Last Pain:  Vitals:   10/21/22 1326  TempSrc:   PainSc: 0-No pain         Complications: No notable events documented.

## 2022-10-21 NOTE — Discharge Instructions (Addendum)

## 2022-10-21 NOTE — Op Note (Addendum)
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  Subsequent imaging and PET CTs consistent with potential recurrent tumor.  Patient has ongoing tobacco habit.  Patient declined bronchoscopy in July and in August when this was offered to him.  He now presents for bronchoscopy.  Preoperative diagnosis: Left upper lobe mass, possible mediastinal adenopathy  Postoperative diagnosis: Left upper lobe mass with no endobronchial component Mediastinal adenopathy consistent with recurrent disease  Consent: (Place X beside choice/s below)  The benefits, risks and possible complications of the procedure were        explained to:  _X__ patient  ___ patient's family  ___ other:___________  who verbalized understanding and gave:  ___ verbal  ___ written  _X__ verbal and written  ___ telephone  ___ other:________ consent.      Unable to obtain consent; procedure performed on emergent basis.     Other:    Benefits, limitations and potential complications of the procedure were discussed with the patient/family.  Complications from bronchoscopy are rare and most often minor, but if they occur they may include breathing difficulty, vocal cord spasm, hoarseness, slight fever, vomiting, dizziness, bronchospasm, infection, low blood oxygen, bleeding from biopsy site, or an allergic reaction to medications.  It is uncommon for patients to  experience other more serious complications for example: Collapsed lung requiring chest tube placement, respiratory failure, heart attack and/or cardiac arrhythmia.  Patient agrees to proceed.    Anesthesia type: General endotracheal  Surgeon: Renold Don, MD Assistant/Scrub: Sullivan Lone, RRT Anesthesiologist/CRNA: Katy Fitch, MD/Janice Rise Patience, CRNA Cytotechnology: Maryan Puls, available   PROCEDURE DETAILS: Patient was taken to Procedure Room 2 (Bronchoscopy Suite) in the OR area.  Appropriate Timeout performed and correct patient, name, ID and laterality confirmed.  Patient was inducted under general anesthesia and intubated with an 8.5 ET tube without difficulty.  Once the patient was under adequate general anesthesia a Portex adapter was placed in the ET tube flange.  Through the Portex adapter the Olympus video therapeutic bronchoscope was then advanced.  The visible distal trachea was normal, carina appeared sharp.  Examination of the right tracheobronchial tree showed diffuse inflammation.  The right upper lobe , right middle lobe, lower lobe bronchi were free of endobronchial lesions or any other abnormality.  At this point bronchoscope was brought to the left mainstem and examination revealed no endobronchial lesions on the left tracheobronchial tree.  There were significant copious, inspissated secretions on the left upper lobe and given a and these were lavaged till clear.  The lower lobe subsegments were clear of secretions.  After completing this, the bronchoscope was switched to an endobronchial ultrasound scope (EBUS scope) and the mediastinum was examined.  There was no significant adenopathy on the precarinal area, no significant adenopathy on the hilar areas, and significant adenopathy noted in the subcarinal area.I proceeded to sample the subcarinal area from a left approach lysing a Olympus VIZI short 21-gauge EBUS needle.  ROSE revealed patient all cells.  A total of 6  passes were  performed these were placed in preservative for further analysis.  Having completed this, the EBUS scope was retrieved and again replaced for the therapeutic video bronchoscope.  The airway was then examined thoroughly to ensure adequate hemostasis.  After examination and inspection and noting excellent hemostasis, the patient received 9 mL of 1% lidocaine via bronchial lavage prior to retrieving the bronchoscope.  At this point the procedure was terminated and the patient was allowed to emerge from general anesthesia.  Patient was extubated in the procedure room and transported to the PACU in satisfactory condition.  The patient did not have any complaints of chest pain or shortness of breath postprocedure.  Postprocedure examination shows no change from preprocedure lung sounds.  Postprocedure chest x-ray showed no pneumothorax, persistent left upper lobe opacity.  Patient tolerated the procedure well.   SPECIMENS (Sites): (Place X beside choice below)  Specimens Description   No Specimens Obtained     Washings    Lavage    Biopsies   X Fine Needle Aspirates EBUS TBNA, station 7, X 6   Brushings    Sputum    FINDINGS:  Irregular mucosa along orifice to left main stem bronchus without distinct lesion:   No distinct endobronchial lesions on the left:   Friable mucosa throughout the left lung:   Prominent 2 x 2 centimeter nodule on the left subcarinal space:   Lymph node attached to mass, subcarinal area:   EBUS needle in lesion biopsy:       ESTIMATED BLOOD LOSS: Less than 2 mL   COMPLICATIONS/RESOLUTION: none, post procedure chest x-ray showed no pneumothorax:   IMPRESSION:POST-PROCEDURE DX:  Left upper lobe mass with no endobronchial component Mediastinal adenopathy consistent with recurrent metastatic disease Mucous plugging left upper lobe, lavaged until clear  RECOMMENDATION/PLAN:  Await final pathology report Patient has appropriate oncology/pulmonary  follow-ups  C. Derrill Kay, MD Advanced Bronchoscopy PCCM [facility]    *This note was dictated using voice recognition software/Dragon.  Despite best efforts to proofread, errors can occur which can change the meaning. Any transcriptional errors that result from this process are unintentional and may not be fully corrected at the time of dictation.     Renold Don, MD Advanced Bronchoscopy PCCM [facility]    *This note was dictated using voice recognition software/Dragon.  Despite best efforts to proofread, errors can occur which can change the meaning.  Any change was purely unintentional.

## 2022-10-21 NOTE — Anesthesia Preprocedure Evaluation (Addendum)
Anesthesia Evaluation  Patient identified by MRN, date of birth, ID band Patient awake    Reviewed: Allergy & Precautions, NPO status , Patient's Chart, lab work & pertinent test results  History of Anesthesia Complications Negative for: history of anesthetic complications  Airway Mallampati: III  TM Distance: >3 FB Neck ROM: full    Dental  (+) Chipped, Poor Dentition, Missing, Edentulous Lower   Pulmonary shortness of breath and with exertion, COPD,  COPD inhaler, Current Smoker and Patient abstained from smoking.,    + rhonchi  + decreased breath sounds      Cardiovascular Exercise Tolerance: Good (-) angina(-) Past MI negative cardio ROS Normal cardiovascular exam     Neuro/Psych  Neuromuscular disease negative psych ROS   GI/Hepatic negative GI ROS, Neg liver ROS, neg GERD  ,  Endo/Other  negative endocrine ROS  Renal/GU      Musculoskeletal   Abdominal   Peds  Hematology negative hematology ROS (+)   Anesthesia Other Findings Past Medical History: No date: COPD (chronic obstructive pulmonary disease) (HCC) No date: Emphysema of lung ([place]) 06/2020: Lung cancer ([place]) No date: Pneumonia  Past Surgical History: 2017: BACK SURGERY     Comment:  lumbar region herniated disc No date: CARDIAC VALVE REPLACEMENT 08/07/2020: VIDEO BRONCHOSCOPY WITH ENDOBRONCHIAL NAVIGATION; N/A     Comment:  Procedure: VIDEO BRONCHOSCOPY WITH ENDOBRONCHIAL               NAVIGATION;  Surgeon: Tyler Pita, MD;  Location:               [facility] ORS;  Service: Pulmonary;  Laterality: N/A; 08/07/2020: VIDEO BRONCHOSCOPY WITH ENDOBRONCHIAL ULTRASOUND; N/A     Comment:  Procedure: VIDEO BRONCHOSCOPY WITH ENDOBRONCHIAL               ULTRASOUND;  Surgeon: Tyler Pita, MD;  Location:               [facility] ORS;  Service: Pulmonary;  Laterality: N/A;  BMI    Body Mass Index: 22.65 kg/m      Reproductive/Obstetrics negative OB  ROS                            Anesthesia Physical Anesthesia Plan  ASA: 3  Anesthesia Plan: General ETT   Post-op Pain Management:    Induction: Intravenous  PONV Risk Score and Plan: Ondansetron, Dexamethasone, Midazolam and Treatment may vary due to age or medical condition  Airway Management Planned: Oral ETT  Additional Equipment:   Intra-op Plan:   Post-operative Plan: Extubation in OR and Possible Post-op intubation/ventilation  Informed Consent: I have reviewed the patients History and Physical, chart, labs and discussed the procedure including the risks, benefits and alternatives for the proposed anesthesia with the patient or authorized representative who has indicated his/her understanding and acceptance.     Dental Advisory Given  Plan Discussed with: Anesthesiologist, CRNA and Surgeon  Anesthesia Plan Comments: (Patient consented for risks of anesthesia including but not limited to:  - adverse reactions to medications - damage to eyes, teeth, lips or other oral mucosa - nerve damage due to positioning  - sore throat or hoarseness - Damage to heart, brain, nerves, lungs, other parts of body or loss of life  Patient voiced understanding.)        Anesthesia Quick Evaluation

## 2022-10-21 NOTE — Interval H&P Note (Signed)
Wesley Ferguson has presented today for surgery, with the diagnosis of LEFT UPPER LOBE CAVITARY MASS and LEFT LOWER LOBE NODULES.  The various methods of treatment have been discussed with the patient and family. After consideration of risks, benefits and other options for treatment, the patient has consented to  Procedure(s): BRONCHOSCOPY WITH BODY VISION IF NECESSARY AND ENDOBRONCHIAL ULTRASOUND FOR STAGING-ATTENTION LEFT as a surgical intervention.  The patient's history has been reviewed, patient examined, no change in status, stable for surgery.  I have reviewed the patient's chart and labs.  Questions were answered to the patient's satisfaction.  Benefits, limitations and potential complications of the procedure were discussed with the patient/family.  Complications from bronchoscopy are rare and most often minor, but if they occur they may include breathing difficulty, vocal cord spasm, hoarseness, slight fever, vomiting, dizziness, bronchospasm, infection, low blood oxygen, bleeding from biopsy site, or an allergic reaction to medications.  It is uncommon for patients to experience other more serious complications for example: Collapsed lung requiring chest tube placement, respiratory failure, heart attack and/or cardiac arrhythmia.  Patient agrees to proceed.    Renold Don, MD Advanced Bronchoscopy PCCM [facility]    *This note was dictated using voice recognition software/Dragon.  Despite best efforts to proofread, errors can occur which can change the meaning. Any transcriptional errors that result from this process are unintentional and may not be fully corrected at the time of dictation.

## 2022-10-21 NOTE — Anesthesia Procedure Notes (Signed)
Procedure Name: Intubation Date/Time: 10/21/2022 12:40 PM  Performed by: Jonna Clark, CRNAPre-anesthesia Checklist: Patient identified, Patient being monitored, Timeout performed, Emergency Drugs available and Suction available Patient Re-evaluated:Patient Re-evaluated prior to induction Oxygen Delivery Method: Circle system utilized Preoxygenation: Pre-oxygenation with 100% oxygen Induction Type: IV induction Ventilation: Mask ventilation without difficulty Laryngoscope Size: 4 and McGraph Grade View: Grade I Tube type: Oral Tube size: 8.5 mm Number of attempts: 1 Airway Equipment and Method: Stylet Placement Confirmation: ETT inserted through vocal cords under direct vision, positive ETCO2 and breath sounds checked- equal and bilateral Secured at: 22 cm Tube secured with: Tape Dental Injury: Teeth and Oropharynx as per pre-operative assessment

## 2022-10-22 NOTE — Anesthesia Postprocedure Evaluation (Signed)
Anesthesia Post Note  Patient: Wesley Ferguson  Procedure(s) Performed: ELECTROMAGNETIC NAVIGATION BRONCHOSCOPY (Left)  Patient location during evaluation: PACU Anesthesia Type: General Level of consciousness: awake and alert Pain management: pain level controlled Vital Signs Assessment: post-procedure vital signs reviewed and stable Respiratory status: spontaneous breathing, nonlabored ventilation, respiratory function stable and patient connected to nasal cannula oxygen Cardiovascular status: blood pressure returned to baseline and stable Postop Assessment: no apparent nausea or vomiting Anesthetic complications: no   No notable events documented.   Last Vitals:  Vitals:   10/21/22 1415 10/21/22 1429  BP: 106/78 117/80  Pulse:  80  Resp: (!) 24 (!) 21  Temp:  36.8 C  SpO2: 97% 96%    Last Pain:  Vitals:   10/21/22 1429  TempSrc: Temporal  PainSc: 0-No pain                 Precious Haws Arlow Spiers

## 2022-10-24 ENCOUNTER — Encounter: Payer: Self-pay | Admitting: Pulmonary Disease

## 2022-10-24 LAB — CYTOLOGY - NON PAP

## 2022-10-28 ENCOUNTER — Ambulatory Visit
Admit: 2022-10-28 | Discharge: 2022-10-28 | Disposition: A | Discharge status: Home / Self Care | Payer: Medicaid Other | Attending: Oncology | Admitting: Oncology

## 2022-10-28 ENCOUNTER — Ambulatory Visit: Payer: Medicaid Other | Admitting: Oncology

## 2022-10-28 DIAGNOSIS — Z08 Encounter for follow-up examination after completed treatment for malignant neoplasm: Secondary | ICD-10-CM | POA: Insufficient documentation

## 2022-10-28 DIAGNOSIS — I3139 Other pericardial effusion (noninflammatory): Secondary | ICD-10-CM | POA: Insufficient documentation

## 2022-10-28 DIAGNOSIS — K769 Liver disease, unspecified: Secondary | ICD-10-CM | POA: Diagnosis not present

## 2022-10-28 DIAGNOSIS — Z85118 Personal history of other malignant neoplasm of bronchus and lung: Secondary | ICD-10-CM | POA: Diagnosis not present

## 2022-10-28 DIAGNOSIS — C3490 Malignant neoplasm of unspecified part of unspecified bronchus or lung: Secondary | ICD-10-CM

## 2022-10-28 DIAGNOSIS — J432 Centrilobular emphysema: Secondary | ICD-10-CM | POA: Insufficient documentation

## 2022-10-28 DIAGNOSIS — R918 Other nonspecific abnormal finding of lung field: Secondary | ICD-10-CM | POA: Diagnosis not present

## 2022-10-28 DIAGNOSIS — R590 Localized enlarged lymph nodes: Secondary | ICD-10-CM | POA: Diagnosis not present

## 2022-10-28 LAB — GLUCOSE, CAPILLARY: Glucose-Capillary: 92 mg/dL (ref 70–99)

## 2022-10-28 MED ORDER — FLUDEOXYGLUCOSE F - 18 (FDG) INJECTION
9.4000 | Freq: Once | INTRAVENOUS | Status: AC | PRN
  Administered 2022-10-28: 10.18 via INTRAVENOUS

## 2022-10-31 ENCOUNTER — Other Ambulatory Visit: Payer: Self-pay | Admitting: *Deleted

## 2022-10-31 ENCOUNTER — Institutional Professional Consult (permissible substitution): Payer: Medicaid Other | Admitting: Radiation Oncology

## 2022-10-31 ENCOUNTER — Ambulatory Visit: Payer: Medicaid Other | Admitting: Oncology

## 2022-10-31 DIAGNOSIS — I3139 Other pericardial effusion (noninflammatory): Secondary | ICD-10-CM

## 2022-10-31 NOTE — Progress Notes (Signed)
Per Dr. Janese Banks, pt needs echo and referral to cardiology ASAP for pericardial effusion. Orders placed, referral entered. Pt will be notified with appts once scheduled.

## 2022-11-02 ENCOUNTER — Inpatient Hospital Stay (HOSPITAL_BASED_OUTPATIENT_CLINIC_OR_DEPARTMENT_OTHER): Payer: Medicaid Other | Admitting: Oncology

## 2022-11-02 ENCOUNTER — Encounter: Payer: Self-pay | Admitting: Oncology

## 2022-11-02 ENCOUNTER — Ambulatory Visit
Admission: RE | Admit: 2022-11-02 | Discharge: 2022-11-02 | Disposition: A | Discharge status: Home / Self Care | Payer: Medicaid Other | Source: Ambulatory Visit | Attending: Radiation Oncology | Admitting: Radiation Oncology

## 2022-11-02 VITALS — BP 106/67 | HR 71 | Temp 98.4°F | Resp 18 | Wt 188.6 lb

## 2022-11-02 DIAGNOSIS — C3490 Malignant neoplasm of unspecified part of unspecified bronchus or lung: Secondary | ICD-10-CM

## 2022-11-02 DIAGNOSIS — Z9221 Personal history of antineoplastic chemotherapy: Secondary | ICD-10-CM | POA: Diagnosis not present

## 2022-11-02 DIAGNOSIS — Z28310 Unvaccinated for covid-19: Secondary | ICD-10-CM | POA: Insufficient documentation

## 2022-11-02 DIAGNOSIS — R058 Other specified cough: Secondary | ICD-10-CM | POA: Diagnosis not present

## 2022-11-02 DIAGNOSIS — I3139 Other pericardial effusion (noninflammatory): Secondary | ICD-10-CM | POA: Diagnosis not present

## 2022-11-02 DIAGNOSIS — C3412 Malignant neoplasm of upper lobe, left bronchus or lung: Secondary | ICD-10-CM | POA: Insufficient documentation

## 2022-11-02 DIAGNOSIS — R0602 Shortness of breath: Secondary | ICD-10-CM | POA: Insufficient documentation

## 2022-11-02 DIAGNOSIS — J439 Emphysema, unspecified: Secondary | ICD-10-CM | POA: Insufficient documentation

## 2022-11-02 DIAGNOSIS — Z801 Family history of malignant neoplasm of trachea, bronchus and lung: Secondary | ICD-10-CM | POA: Insufficient documentation

## 2022-11-02 DIAGNOSIS — D1803 Hemangioma of intra-abdominal structures: Secondary | ICD-10-CM | POA: Insufficient documentation

## 2022-11-02 DIAGNOSIS — R0609 Other forms of dyspnea: Secondary | ICD-10-CM | POA: Insufficient documentation

## 2022-11-02 DIAGNOSIS — Z923 Personal history of irradiation: Secondary | ICD-10-CM | POA: Insufficient documentation

## 2022-11-02 DIAGNOSIS — R5383 Other fatigue: Secondary | ICD-10-CM | POA: Insufficient documentation

## 2022-11-02 DIAGNOSIS — Z8249 Family history of ischemic heart disease and other diseases of the circulatory system: Secondary | ICD-10-CM | POA: Insufficient documentation

## 2022-11-02 DIAGNOSIS — I700 Atherosclerosis of aorta: Secondary | ICD-10-CM | POA: Insufficient documentation

## 2022-11-02 DIAGNOSIS — Z808 Family history of malignant neoplasm of other organs or systems: Secondary | ICD-10-CM | POA: Insufficient documentation

## 2022-11-02 DIAGNOSIS — Z87891 Personal history of nicotine dependence: Secondary | ICD-10-CM | POA: Insufficient documentation

## 2022-11-02 DIAGNOSIS — R590 Localized enlarged lymph nodes: Secondary | ICD-10-CM | POA: Insufficient documentation

## 2022-11-02 DIAGNOSIS — Z833 Family history of diabetes mellitus: Secondary | ICD-10-CM | POA: Insufficient documentation

## 2022-11-02 DIAGNOSIS — F17210 Nicotine dependence, cigarettes, uncomplicated: Secondary | ICD-10-CM | POA: Insufficient documentation

## 2022-11-02 DIAGNOSIS — Z79899 Other long term (current) drug therapy: Secondary | ICD-10-CM | POA: Insufficient documentation

## 2022-11-02 NOTE — Progress Notes (Signed)
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  REFERRING PROVIDER: No ref. provider found  HPI: Patient is a 56 year old male well-known to our department.  He was originally treated approximate 2 and half years prior with concurrent chemoradiation therapy for stage IIIa squamous of carcinoma left lung.  He also received approximately year and a half ago SBRT salvage for recurrent disease.  He developed changes on his CT scan with 2 nodular densities in the periphery of the left lung which may be recurrent disease.  He was fairly asymptomatic.  He was post to see Dr. Patsey Berthold back in July although declined that recently was seen by Dr. Patsey Berthold and had bronchoscopy with cytology positive for squamous cell carcinoma..  His most recent PET CT scan showed increasing hypermetabolic necrotic masslike consolidation of posterior left upper lobe with a new hypermetabolic subcarinal lymph node worrisome for disease progression.  He also has a moderate pericardial effusion.  He is seen today for consideration of further salvage treatment.  He does have a slightly productive cough and marked shortness of breath and dyspnea on exertion.  He is having no hemoptysis.  COMPLICATIONS OF TREATMENT: none  FOLLOW UP COMPLIANCE: keeps appointments   PHYSICAL EXAM:  There were no vitals taken for this visit. Well-developed well-nourished patient in NAD. HEENT reveals PERLA, EOMI, discs not visualized.  Oral cavity is clear. No oral mucosal lesions are identified. Neck is  clear without evidence of cervical or supraclavicular adenopathy. Lungs are clear to A&P. Cardiac examination is essentially unremarkable with regular rate and rhythm without murmur rub or thrill. Abdomen is benign with no organomegaly or masses noted. Motor sensory and DTR levels are equal and symmetric in the upper and lower extremities. Cranial nerves II through XII are grossly intact. Proprioception is intact. No peripheral adenopathy or edema is identified. No motor or sensory levels are noted. Crude visual fields are within normal range.  RADIOLOGY RESULTS: PET CT scan reviewed compatible with above-stated findings  PLAN: Present time I have recommended salvage radiation therapy with concurrent chemotherapy.  We will plan on delivering 60 Gray over 6 weeks with concurrent chemotherapy.  Macro chemotherapy I will plan on using IMRT treatment planning and delivery systems is consistent with 3B disease with recurrence.  I would also choose IMRT based on the previous areas of radiation including large field with his initial treatment as well as SBRT treatment.  I would use PET CT fusion study for treatment planning purposes.  We will certainly incorporate his subcarinal node.  Patient comprehends my recommendations well.  I have personally set up and ordered CT simulation.  I discussed the case personally with medical oncology.  Patient comprehends my recommendations well.  I would like to take this opportunity to thank you for allowing me to participate in the care of your patient.Noreene Filbert, MD

## 2022-11-03 ENCOUNTER — Ambulatory Visit: Payer: Medicaid Other

## 2022-11-06 ENCOUNTER — Other Ambulatory Visit: Payer: Self-pay | Admitting: Nurse Practitioner

## 2022-11-06 ENCOUNTER — Encounter: Payer: Self-pay | Admitting: Oncology

## 2022-11-06 ENCOUNTER — Other Ambulatory Visit: Payer: Self-pay | Admitting: Pulmonary Disease

## 2022-11-06 ENCOUNTER — Other Ambulatory Visit: Payer: Self-pay | Admitting: Oncology

## 2022-11-06 NOTE — Progress Notes (Signed)
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  He had a chest x-ray followed by a CT scan which showed a 5.7 x 4.4 cm left upper lobe mass along with left hilar adenopathy.  There was a low-density 3.6 lesion noted in the right hepatic lobe which ultimately turned out to be a hemangioma on MRI liver.  PET CT scan showed hypermetabolic activity in the left upper lobe and left hilar nodal tissue.  Subcarinal and right paratracheal lymph nodes with mild hypermetabolic features.   Patient underwent bronchoscopies and biopsies.Left upper lobe biopsy was consistent with non-small cell carcinoma favor squamous cell carcinoma right subcarinal lymph node biopsy was negative for malignancy.   Plan is for concurrent chemoradiation with carbotaxol followed by maintenance durvalumab.  Patient does not want to get a port placed.  He is also refused Covid vaccination   NGS testing showed high tumor mutational burden.  PD-L1 90%.  MSI stable.  Patient PIK3CA and notch2 FCHO2 fusion, T p53   Scans in May 2022 Showed 2 hypermetabolic nodules in the left upper lobe and new hypermetabolic cavitary nodule in the right upper lobe concerning for disease progression.  No evidence of distant metastatic disease.  Patient was seen by Dr. Donella Stade and received SBRT to the left lung with plans  for continued monitoring of the right upper lobe lung nodule.  Patient completed maintenance durvalumab in March 2023     Repeat imaging has shown evolving changes in the left upper lobe with findings concerning for infection versus malignancy.  Patient therefore had a bronchoscopy which showed recurrent keratinizing squamous cell carcinoma.  PET CT scan did not show any evidence of distant metastatic disease.  Hypermetabolic necrotic masslike consolidation in the posterior upper lobe with hypermetabolic subcarinal adenopathy in November 2023.  Interval history-patient is here with his daughter.  He reports feeling fatigued.  Chronic exertional shortness of breath.  ECOG PS- 2 Pain scale- 3   Review of systems- Review of Systems  Constitutional:  Positive for malaise/fatigue. Negative for chills, fever and weight loss.  HENT:  Negative for congestion, ear discharge and nosebleeds.   Eyes:  Negative for blurred vision.  Respiratory:  Positive for shortness of breath. Negative for cough, hemoptysis, sputum production and wheezing.   Cardiovascular:  Negative for chest pain, palpitations, orthopnea and claudication.  Gastrointestinal:  Negative for abdominal pain, blood in stool, constipation, diarrhea, heartburn, melena, nausea and vomiting.  Genitourinary:  Negative for dysuria, flank pain, frequency, hematuria and urgency.  Musculoskeletal:  Negative for back pain, joint pain and myalgias.  Skin:  Negative for rash.  Neurological:  Negative for dizziness, tingling, focal weakness, seizures, weakness and headaches.  Endo/Heme/Allergies:  Does not bruise/bleed easily.  Psychiatric/Behavioral:  Negative for depression and suicidal ideas. The patient does not have insomnia.       No Known Allergies   Past  Medical History:  Diagnosis Date   COPD (chronic obstructive pulmonary disease) ([place])    Emphysema of lung ([place])    Lung cancer ([place]) 06/2020   Pneumonia      Past Surgical  History:  Procedure Laterality Date   BACK SURGERY  2017   lumbar region herniated disc   CARDIAC VALVE REPLACEMENT     ELECTROMAGNETIC NAVIGATION BROCHOSCOPY Left 10/21/2022   Procedure: ELECTROMAGNETIC NAVIGATION BRONCHOSCOPY;  Surgeon: Tyler Pita, MD;  Location: [facility] ORS;  Service: Cardiopulmonary;  Laterality: Left;   VIDEO BRONCHOSCOPY WITH ENDOBRONCHIAL NAVIGATION N/A 08/07/2020   Procedure: VIDEO BRONCHOSCOPY WITH ENDOBRONCHIAL NAVIGATION;  Surgeon: Tyler Pita, MD;  Location: [facility] ORS;  Service: Pulmonary;  Laterality: N/A;   VIDEO BRONCHOSCOPY WITH ENDOBRONCHIAL ULTRASOUND N/A 08/07/2020   Procedure: VIDEO BRONCHOSCOPY WITH ENDOBRONCHIAL ULTRASOUND;  Surgeon: Tyler Pita, MD;  Location: [facility] ORS;  Service: Pulmonary;  Laterality: N/A;    Social History   Socioeconomic History   Marital status: Divorced    Spouse name: Not on file   Number of children: Not on file   Years of education: Not on file   Highest education level: Not on file  Occupational History   Not on file  Tobacco Use   Smoking status: Every Day    Packs/day: 4.00    Years: 35.00    Total pack years: 140.00    Types: Cigarettes   Smokeless tobacco: Former    Types: Chew   Tobacco comments:    2PPD 08/24/2022    2 PPD 10/04/2022- Fabian Sharp, CMa  Vaping Use   Vaping Use: Never used  Substance and Sexual Activity   Alcohol use: Not Currently    Comment: 1 beer yest and not before 1 month ago   Drug use: No   Sexual activity: Not Currently  Other Topics Concern   Not on file  Social History Narrative   Not on file   Social Determinants of Health   Financial Resource Strain: Not on file  Food Insecurity: Not on file  Transportation Needs: Not on file  Physical Activity: Not on file  Stress: Not on file  Social Connections: Not on file  Intimate Partner Violence: Not on file    Family History  Problem Relation Age of Onset   Diabetes Father    Lung cancer Father  48   Heart attack Father    Throat cancer Maternal Aunt      Current Outpatient Medications:    acetaminophen (TYLENOL) 500 MG tablet, Take 500-1,000 mg by mouth every 6 (six) hours as needed (for pain.)., Disp: , Rfl:    albuterol (PROVENTIL) (2.5 MG/3ML) 0.083% nebulizer solution, Take 3 mLs (2.5 mg total) by nebulization every 6 (six) hours as needed for wheezing or shortness of breath., Disp: 75 mL, Rfl: 2   albuterol (VENTOLIN HFA) 108 (90 Base) MCG/ACT inhaler, TAKE 2 PUFFS BY MOUTH EVERY 6 HOURS AS NEEDED FOR WHEEZE OR SHORTNESS OF BREATH, Disp: 18 each, Rfl: 2   Budeson-Glycopyrrol-Formoterol (BREZTRI AEROSPHERE) 160-9-4.8 MCG/ACT AERO, Inhale 2 puffs into the lungs in the morning and at bedtime., Disp: 5.9 g, Rfl: 0   levothyroxine (SYNTHROID) 137 MCG tablet, Take 1 tablet (137 mcg total) by mouth daily before breakfast., Disp: 90 tablet, Rfl: 0   morphine (MS CONTIN) 30 MG 12 hr tablet, Take 1 tablet (30 mg total) by mouth every 12 (twelve) hours., Disp: 60 tablet, Rfl: 0   Oxycodone HCl 10 MG TABS, Take 1 tablet (  10 mg total) by mouth every 4 (four) hours as needed., Disp: 180 tablet, Rfl: 0   prochlorperazine (COMPAZINE) 10 MG tablet, Take 1 tablet (10 mg total) by mouth every 6 (six) hours as needed for nausea or vomiting. (Patient not taking: Reported on 11/02/2022), Disp: 30 tablet, Rfl: 1  Physical exam:  Vitals:   11/02/22 0907  BP: 106/67  Pulse: 71  Resp: 18  Temp: 98.4 F (36.9 C)  SpO2: 100%  Weight: 188 lb 9.6 oz (85.5 kg)   Physical Exam Constitutional:      General: He is not in acute distress. Cardiovascular:     Rate and Rhythm: Normal rate and regular rhythm.     Heart sounds: Normal heart sounds.  Pulmonary:     Effort: Pulmonary effort is normal.     Breath sounds: Normal breath sounds.  Abdominal:     General: Bowel sounds are normal.     Palpations: Abdomen is soft.  Skin:    General: Skin is warm and dry.  Neurological:     Mental Status: He is  alert and oriented to person, place, and time.         Latest Ref Rng & Units 07/08/2022    2:49 PM  CMP  Glucose 70 - 99 mg/dL 109   BUN 6 - 20 mg/dL 7   Creatinine 0.61 - 1.24 mg/dL 0.95   Sodium 135 - 145 mmol/L 128   Potassium 3.5 - 5.1 mmol/L 3.9   Chloride 98 - 111 mmol/L 94   CO2 22 - 32 mmol/L 27   Calcium 8.9 - 10.3 mg/dL 8.9   Total Protein 6.5 - 8.1 g/dL 7.8   Total Bilirubin 0.3 - 1.2 mg/dL 0.3   Alkaline Phos 38 - 126 U/L 77   AST 15 - 41 U/L 52   ALT 0 - 44 U/L 20       Latest Ref Rng & Units 07/08/2022    2:49 PM  CBC  WBC 4.0 - 10.5 K/uL 9.8   Hemoglobin 13.0 - 17.0 g/dL 10.6   Hematocrit 39.0 - 52.0 % 32.2   Platelets 150 - 400 K/uL 328     No images are attached to the encounter.  NM PET Image Restag (PS) Skull Base To Thigh  Result Date: 10/31/2022 CLINICAL DATA:  Subsequent treatment strategy for lung cancer. EXAM: NUCLEAR MEDICINE PET SKULL BASE TO THIGH TECHNIQUE: 10.2 mCi F-18 FDG was injected intravenously. Full-ring PET imaging was performed from the skull base to thigh after the radiotracer. CT data was obtained and used for attenuation correction and anatomic localization. Fasting blood glucose: 92 mg/dl COMPARISON:  CT chest 10/18/2022, PET 06/06/2022, CT chest abdomen pelvis 05/03/2022. FINDINGS: Mediastinal blood pool activity: SUV max 1.6 Liver activity: SUV max NA NECK: No abnormal hypermetabolism. Incidental CT findings: None. CHEST: Masslike necrotic consolidation in the posterior left upper lobe is increasingly hypermetabolic along the ventral periphery, SUV max 11.3, compared to 5.6-7.3 previously. New hypermetabolic subcarinal lymph node measures 10 mm, SUV max 8.7. Incidental CT findings: Atherosclerotic calcification of the aorta. Moderate pericardial effusion, similar. No pleural effusion. Centrilobular emphysema. Post treatment scarring in the anterior segment right upper lobe. ABDOMEN/PELVIS: No abnormal hypermetabolism. Incidental CT  findings: Hypodense lesions in the liver measure up to 1.9 cm in the right hepatic lobe, previously characterized as probable hemangiomas on 05/03/2022. Liver, gallbladder, adrenal glands, kidneys, spleen, pancreas, stomach and bowel are otherwise unremarkable. Atherosclerotic calcification of the aorta. Trace pelvic free fluid.  Bladder wall appears thickened. SKELETON: No abnormal hypermetabolism. Incidental CT findings: Degenerative changes in the spine. IMPRESSION: 1. Increasingly hypermetabolic, necrotic and masslike consolidation in the posterior left upper lobe, with a new hypermetabolic subcarinal lymph node, findings worrisome for disease progression. A superimposed infectious process is not excluded. 2. Moderate pericardial effusion, similar. 3.  Aortic atherosclerosis (ICD10-I70.0). 4.  Emphysema (ICD10-J43.9). Electronically Signed   By: Lorin Picket M.D.   On: 10/31/2022 14:01   DG Chest Port 1 View  Result Date: 10/21/2022 CLINICAL DATA:  Status post bronchoscopy with biopsy. EXAM: PORTABLE CHEST 1 VIEW COMPARISON:  March 22, 2022. FINDINGS: Stable cardiomediastinal silhouette. Stable left upper lobe density is noted consistent with malignancy or fibrosis or scarring. No pneumothorax is noted. No significant pleural effusion is noted. Mild right midlung subsegmental atelectasis or scarring is noted. Bony thorax is unremarkable. IMPRESSION: No pneumothorax is noted status post bronchoscopy. Left upper lobe opacity is noted concerning for scarring, malignancy or fibrosis. Electronically Signed   By: Marijo Conception M.D.   On: 10/21/2022 14:57   CT SUPER D CHEST WO MONARCH PILOT  Result Date: 10/20/2022 CLINICAL DATA:  Cavitating left upper lobe lung mass. History of squamous cell lung cancer. * Tracking Code: BO * EXAM: CT CHEST WITHOUT CONTRAST TECHNIQUE: Multidetector CT imaging of the chest was performed using thin slice collimation for electromagnetic bronchoscopy planning purposes,  without intravenous contrast. RADIATION DOSE REDUCTION: This exam was performed according to the departmental dose-optimization program which includes automated exposure control, adjustment of the mA and/or kV according to patient size and/or use of iterative reconstruction technique. COMPARISON:  PET-CT 06/06/2022.  Chest CT 05/03/2022 and 02/16/2022. FINDINGS: Cardiovascular: Minimal aortic and coronary artery atherosclerosis. No acute vascular findings on noncontrast CT. A pericardial effusion has mildly enlarged in the interval, without hypermetabolic activity on prior PET-CT. The heart size remains normal. Mediastinum/Nodes: There are no enlarged mediastinal, hilar or axillary lymph nodes.Hilar assessment is limited by the lack of intravenous contrast, although the hilar contours appear unchanged. The thyroid gland, trachea and esophagus demonstrate no significant findings. Lungs/Pleura: No pleural effusion or pneumothorax. Moderate centrilobular and paraseptal emphysema. Inferiorly in the right upper lobe, there are stable radiation changes with band like opacity on the reformatted images. No suspicious right lung findings. Previously demonstrated cavitary process posteriorly in the left upper and lower lobes no longer demonstrates any significant air filled cavitation. There is low-density consolidation posteriorly in the left upper lobe which is similar in overall volume to the previous study. The component in the superior segment of the lower lobe has retracted and decreased in size. There is stable scarring in the lingula. Subpleural 5 mm left lower lobe nodule on image 128/3 is stable, likely benign based on stability. No definite new or enlarging nodules. Upper abdomen: No suspicious findings are seen within the visualized upper abdomen. There are low-density hepatic lesions which correspond with hemangiomas on prior imaging. Musculoskeletal/Chest wall: There is no chest wall mass or suspicious osseous  finding. Infiltration of the extrapleural fat posteriorly in the left 4th and 5th rib spaces appears unchanged, without evidence of associated rib destruction. IMPRESSION: 1. Imaging for bronchoscopy planning and guidance. 2. The cavitary process posteriorly in the left upper and lower lobes no longer demonstrates any significant air-filled cavitation. There is low-density consolidation posteriorly in the left upper lobe which is similar in overall volume to the previous study. The component in the superior segment of the left lower lobe has retracted and decreased in  size. These findings suggest a possible evolving infectious etiology, although malignancy not excluded, and there is adjacent infiltration of the posterior extrapleural fat without rib destruction. 3. Stable radiation changes in the inferior right upper lobe. No suspicious right lung findings. 4. No evidence of metastatic disease. 5. Small pericardial effusion, mildly increased in size in the interval. 6.  Emphysema (ICD10-J43.9). Electronically Signed   By: Richardean Sale M.D.   On: 10/20/2022 11:22     Assessment and plan- Patient is a 56 y.o. male with history of stage III squamous cell carcinoma of the left upper lobe s/p concurrent chemoradiation with weekly CarboTaxol followed by maintenance durvalumab ending in March 2023.  He is here to discuss biopsy results and further management  Patient has biopsy-proven locally recurrent stage III lung cancer now involving the left upper lobe with hypermetabolic subcarinal adenopathy.  NGS testing showed high PD-L1 expression although he seems to have developed progressive disease fairly quickly after stopping immunotherapy.  Patient will be meeting with Dr. Baruch Gouty from radiation oncology today and he would likely qualify for repeat course of radiation.  I would like to offer him [place] etoposide chemotherapy this time around given his squamous histology and the fact that he had recurrent disease  within a year of treatment.  Carbo platinum will be given on day 1 and etoposide on 3 consecutive days IV every 3 weeks for up to 4 cycles.  Following that we will continue to monitor him off treatment with repeat scans.  Discussed risks and benefits of chemotherapy including all but not limited to nausea, vomiting, low blood counts, risk of infections and hospitalization.  I am concerned that radiation alone may not be enough to control the growth of tumor.  Patient at this time remains undecided as to if he wants to pursue chemotherapy and/or radiation or not.  I will give him a couple of weeks to think about it and I will see him thereafter to discuss further management.  I will obtain an interim MRI brain with and without contrast to complete his staging work-up.  Treatment will be given with a curative intent   Visit Diagnosis 1. Malignant neoplasm of unspecified part of unspecified bronchus or lung ([place])      Dr. Randa Evens, MD, MPH [facility] at [facility] [phone] 11/06/2022 2:44 PM

## 2022-11-07 MED ORDER — BREZTRI AEROSPHERE 160-9-4.8 MCG/ACT IN AERO: 2.0000 | INHALATION_SPRAY | Freq: Two times a day (BID) | RESPIRATORY_TRACT | 0 refills | Status: DC

## 2022-11-07 MED ORDER — MORPHINE SULFATE ER 30 MG PO TBCR: 30.0000 mg | EXTENDED_RELEASE_TABLET | Freq: Two times a day (BID) | ORAL | 0 refills | Status: DC

## 2022-11-07 MED ORDER — OXYCODONE HCL 10 MG PO TABS: 10.0000 mg | ORAL_TABLET | ORAL | 0 refills | Status: DC | PRN

## 2022-11-07 MED ORDER — LEVOTHYROXINE SODIUM 137 MCG PO TABS: 137.0000 ug | ORAL_TABLET | Freq: Every day | ORAL | 1 refills | Status: DC

## 2022-11-07 NOTE — Telephone Encounter (Signed)
TSH Order: [id number] Status: Final result     Visible to patient: Yes (not seen)     Next appt: 11/09/2022 at 09:00 AM in Radiation Oncology ([facility] CT SIM)     Dx: Autoimmune hypothyroidism; Malignant ...   0 Result Notes           Component Ref Range & Units 8 mo ago (02/22/22) 9 mo ago (01/25/22) 10 mo ago (12/28/21) 1 yr ago (10/19/21) 1 yr ago (09/20/21) 1 yr ago (08/02/21) 1 yr ago (05/07/21)  TSH 0.350 - 4.500 uIU/mL 9.670 High  15.596 High  CM 29.028 High  CM 47.000 High  CM 98.000 High  CM 127.000 High  CM 10.148 High  CM  Comment: Performed by a 3rd Generation assay with a functional sensitivity of <=0.01 uIU/mL. Performed at [facility], [address] [place], [place] [address]  Resulting Agency  [facility] [place] [facility] [place] [facility] [place] [facility] [place] [facility] [place] [facility]         Specimen Collected: 02/22/22 09:34 Last Resulted: 02/22/22 10:47      Lab Flowsheet      Order Details      View Encounter      Lab and Collection Details      Routing      Result History    View All Conversations on this Encounter      CM=Additional comments      Result Care Coordination   Patient Communication   Add Comments   Add Notifications  Back to Top      Other Results from 02/22/2022  T4, free Order: [id number] Status: Final result      Visible to patient: Yes (not seen)      Next appt: 11/09/2022 at 09:00 AM in Radiation Oncology ([facility] CT SIM)      Dx: Autoimmune hypothyroidism; Malignant ...    0 Result Notes          Component Ref Range & Units 8 mo ago (02/22/22) 1 yr ago (08/02/21) 1 yr ago (05/07/21) 1 yr ago (02/18/21)  Free T4 0.61 - 1.12 ng/dL 1.00 0.32 Low  CM 0.64 CM 0.68 CM

## 2022-11-09 ENCOUNTER — Ambulatory Visit: Payer: Medicaid Other

## 2022-11-10 ENCOUNTER — Ambulatory Visit
Admission: RE | Admit: 2022-11-10 | Discharge: 2022-11-10 | Disposition: A | Discharge status: Home / Self Care | Payer: Medicaid Other | Source: Ambulatory Visit | Attending: Oncology | Admitting: Oncology

## 2022-11-10 DIAGNOSIS — C3490 Malignant neoplasm of unspecified part of unspecified bronchus or lung: Secondary | ICD-10-CM | POA: Diagnosis not present

## 2022-11-10 MED ORDER — GADOBUTROL 1 MMOL/ML IV SOLN
9.0000 mL | Freq: Once | INTRAVENOUS | Status: AC | PRN
  Administered 2022-11-10: 9 mL via INTRAVENOUS

## 2022-11-14 ENCOUNTER — Ambulatory Visit
Admission: RE | Admit: 2022-11-14 | Discharge: 2022-11-14 | Disposition: A | Discharge status: Home / Self Care | Payer: Medicaid Other | Source: Ambulatory Visit | Attending: Oncology | Admitting: Oncology

## 2022-11-14 DIAGNOSIS — Z85118 Personal history of other malignant neoplasm of bronchus and lung: Secondary | ICD-10-CM | POA: Diagnosis not present

## 2022-11-14 DIAGNOSIS — J439 Emphysema, unspecified: Secondary | ICD-10-CM | POA: Insufficient documentation

## 2022-11-14 DIAGNOSIS — I3139 Other pericardial effusion (noninflammatory): Secondary | ICD-10-CM

## 2022-11-14 DIAGNOSIS — J449 Chronic obstructive pulmonary disease, unspecified: Secondary | ICD-10-CM | POA: Diagnosis not present

## 2022-11-14 LAB — ECHOCARDIOGRAM COMPLETE
AR max vel: 2.16 cm2
AV Area VTI: 2.42 cm2
AV Area mean vel: 2.33 cm2
AV Mean grad: 4 mmHg
AV Peak grad: 7.6 mmHg
Ao pk vel: 1.38 m/s
Area-P 1/2: 3.81 cm2
Calc EF: 59.3 %
S' Lateral: 3.4 cm
Single Plane A2C EF: 58.7 %
Single Plane A4C EF: 61.2 %

## 2022-11-14 NOTE — Progress Notes (Signed)
*  PRELIMINARY RESULTS* Echocardiogram 2D Echocardiogram has been performed.  Wesley Ferguson Wesley Ferguson 11/14/2022, 11:38 AM

## 2022-11-15 ENCOUNTER — Inpatient Hospital Stay: Payer: Medicaid Other | Admitting: Oncology

## 2022-11-15 ENCOUNTER — Telehealth: Payer: Self-pay | Admitting: *Deleted

## 2022-11-15 NOTE — Telephone Encounter (Signed)
Patient called to reschedule his appointment.

## 2022-11-21 ENCOUNTER — Other Ambulatory Visit: Payer: Self-pay | Admitting: *Deleted

## 2022-11-21 ENCOUNTER — Ambulatory Visit: Payer: Medicaid Other

## 2022-11-21 MED ORDER — OXYCODONE HCL 10 MG PO TABS: 10.0000 mg | ORAL_TABLET | ORAL | 0 refills | Status: DC | PRN

## 2022-11-21 NOTE — Telephone Encounter (Signed)
[facility] has not been able to fill his prescription for Oxy 10 mg ordered 11/13 as it is on back order and they do not know when they will get more in. He asks that prescription be sent to [facility] at [place]. I did call and they can fill it for 180 tabs. Please sign prescription

## 2022-11-22 ENCOUNTER — Encounter: Payer: Self-pay | Admitting: Oncology

## 2022-11-22 ENCOUNTER — Ambulatory Visit: Payer: Medicaid Other

## 2022-11-23 ENCOUNTER — Ambulatory Visit: Payer: Medicaid Other

## 2022-11-24 ENCOUNTER — Ambulatory Visit: Payer: Medicaid Other

## 2022-11-25 ENCOUNTER — Ambulatory Visit: Payer: Medicaid Other

## 2022-11-28 ENCOUNTER — Ambulatory Visit: Payer: Medicaid Other

## 2022-11-29 ENCOUNTER — Ambulatory Visit: Payer: Medicaid Other

## 2022-11-29 ENCOUNTER — Encounter: Payer: Self-pay | Admitting: Oncology

## 2022-11-29 ENCOUNTER — Inpatient Hospital Stay: Payer: Medicaid Other | Attending: Oncology | Admitting: Oncology

## 2022-11-29 VITALS — BP 99/63 | HR 81 | Temp 98.0°F | Resp 18 | Wt 182.0 lb

## 2022-11-29 DIAGNOSIS — Z833 Family history of diabetes mellitus: Secondary | ICD-10-CM | POA: Diagnosis not present

## 2022-11-29 DIAGNOSIS — R590 Localized enlarged lymph nodes: Secondary | ICD-10-CM | POA: Diagnosis not present

## 2022-11-29 DIAGNOSIS — Z28310 Unvaccinated for covid-19: Secondary | ICD-10-CM | POA: Insufficient documentation

## 2022-11-29 DIAGNOSIS — Z79899 Other long term (current) drug therapy: Secondary | ICD-10-CM | POA: Diagnosis not present

## 2022-11-29 DIAGNOSIS — C3490 Malignant neoplasm of unspecified part of unspecified bronchus or lung: Secondary | ICD-10-CM

## 2022-11-29 DIAGNOSIS — Z8249 Family history of ischemic heart disease and other diseases of the circulatory system: Secondary | ICD-10-CM | POA: Diagnosis not present

## 2022-11-29 DIAGNOSIS — J439 Emphysema, unspecified: Secondary | ICD-10-CM | POA: Insufficient documentation

## 2022-11-29 DIAGNOSIS — C3412 Malignant neoplasm of upper lobe, left bronchus or lung: Secondary | ICD-10-CM | POA: Insufficient documentation

## 2022-11-29 DIAGNOSIS — R0602 Shortness of breath: Secondary | ICD-10-CM | POA: Diagnosis not present

## 2022-11-29 DIAGNOSIS — Z7189 Other specified counseling: Secondary | ICD-10-CM

## 2022-11-29 DIAGNOSIS — Z79890 Hormone replacement therapy (postmenopausal): Secondary | ICD-10-CM | POA: Diagnosis not present

## 2022-11-29 DIAGNOSIS — Z808 Family history of malignant neoplasm of other organs or systems: Secondary | ICD-10-CM | POA: Diagnosis not present

## 2022-11-29 DIAGNOSIS — R5383 Other fatigue: Secondary | ICD-10-CM | POA: Insufficient documentation

## 2022-11-29 DIAGNOSIS — F17210 Nicotine dependence, cigarettes, uncomplicated: Secondary | ICD-10-CM | POA: Insufficient documentation

## 2022-11-29 DIAGNOSIS — Z801 Family history of malignant neoplasm of trachea, bronchus and lung: Secondary | ICD-10-CM | POA: Diagnosis not present

## 2022-11-30 ENCOUNTER — Ambulatory Visit: Payer: Medicaid Other

## 2022-12-01 ENCOUNTER — Ambulatory Visit: Payer: Medicaid Other

## 2022-12-02 ENCOUNTER — Ambulatory Visit: Payer: Medicaid Other

## 2022-12-04 ENCOUNTER — Encounter: Payer: Self-pay | Admitting: Oncology

## 2022-12-04 MED ORDER — LIDOCAINE-PRILOCAINE 2.5-2.5 % EX CREA: TOPICAL_CREAM | CUTANEOUS | 3 refills | Status: DC

## 2022-12-04 MED ORDER — DEXAMETHASONE 4 MG PO TABS: ORAL_TABLET | ORAL | 1 refills | Status: DC

## 2022-12-04 MED ORDER — PROCHLORPERAZINE MALEATE 10 MG PO TABS: 10.0000 mg | ORAL_TABLET | Freq: Four times a day (QID) | ORAL | 1 refills | Status: DC | PRN

## 2022-12-04 MED ORDER — ONDANSETRON HCL 8 MG PO TABS: 8.0000 mg | ORAL_TABLET | Freq: Three times a day (TID) | ORAL | 1 refills | Status: DC | PRN

## 2022-12-04 NOTE — Progress Notes (Signed)
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  He had a chest x-ray followed by a CT scan which showed a 5.7 x 4.4 cm left upper lobe mass along with left hilar adenopathy.  There was a low-density 3.6 lesion noted in the right hepatic lobe which ultimately turned out to be a hemangioma on MRI liver.  PET CT scan showed hypermetabolic activity in the left upper lobe and left hilar nodal tissue.  Subcarinal and right paratracheal lymph nodes with mild hypermetabolic features.   Patient underwent bronchoscopies and biopsies.Left upper lobe biopsy was consistent with non-small cell carcinoma favor squamous cell carcinoma right subcarinal lymph node biopsy was negative for malignancy.   Plan is for concurrent chemoradiation with carbotaxol followed by maintenance durvalumab.  Patient does not want to get a port placed.  He is also refused Covid vaccination   NGS testing showed high tumor mutational burden.  PD-L1 90%.  MSI stable.  Patient PIK3CA and notch2 FCHO2 fusion, T p53   Scans in May 2022 Showed 2 hypermetabolic nodules in the left upper lobe and new hypermetabolic cavitary nodule in the right upper lobe concerning for disease progression.  No evidence of distant metastatic disease.  Patient was seen by Dr. Donella Stade and received SBRT to  the left lung with plans for continued monitoring of the right upper lobe lung nodule.  Patient completed maintenance durvalumab in March 2023     Repeat imaging has shown evolving changes in the left upper lobe with findings concerning for infection versus malignancy.  Patient therefore had a bronchoscopy which showed recurrent keratinizing squamous cell carcinoma.  PET CT scan did not show any evidence of distant metastatic disease.  Hypermetabolic necrotic masslike consolidation in the posterior upper lobe with hypermetabolic subcarinal adenopathy in November 2023.  Interval history-patient does report ongoing fatigue and exertional shortness of breath.  He is willing to restart treatment for his lung cancer but he wants to wait until after Christmas  ECOG PS- 2 Pain scale- 3   Review of systems- Review of Systems  Constitutional:  Positive for malaise/fatigue. Negative for chills, fever and weight loss.  HENT:  Negative for congestion, ear discharge and nosebleeds.   Eyes:  Negative for blurred vision.  Respiratory:  Negative for cough, hemoptysis, sputum production, shortness of breath and wheezing.   Cardiovascular:  Negative for chest pain, palpitations, orthopnea and claudication.  Gastrointestinal:  Negative for abdominal pain, blood in stool, constipation, diarrhea, heartburn, melena, nausea and vomiting.  Genitourinary:  Negative for dysuria, flank pain, frequency, hematuria and urgency.  Musculoskeletal:  Negative for back pain, joint pain and myalgias.  Skin:  Negative for rash.  Neurological:  Negative for dizziness, tingling, focal weakness, seizures, weakness and headaches.  Endo/Heme/Allergies:  Does not bruise/bleed easily.  Psychiatric/Behavioral:  Negative for depression and suicidal ideas. The patient  does not have insomnia.       No Known Allergies   Past Medical History:  Diagnosis Date   COPD (chronic obstructive pulmonary disease) ([place])    Emphysema of lung  ([place])    Lung cancer ([place]) 06/2020   Pneumonia      Past Surgical History:  Procedure Laterality Date   BACK SURGERY  2017   lumbar region herniated disc   CARDIAC VALVE REPLACEMENT     ELECTROMAGNETIC NAVIGATION BROCHOSCOPY Left 10/21/2022   Procedure: ELECTROMAGNETIC NAVIGATION BRONCHOSCOPY;  Surgeon: Tyler Pita, MD;  Location: [facility] ORS;  Service: Cardiopulmonary;  Laterality: Left;   VIDEO BRONCHOSCOPY WITH ENDOBRONCHIAL NAVIGATION N/A 08/07/2020   Procedure: VIDEO BRONCHOSCOPY WITH ENDOBRONCHIAL NAVIGATION;  Surgeon: Tyler Pita, MD;  Location: [facility] ORS;  Service: Pulmonary;  Laterality: N/A;   VIDEO BRONCHOSCOPY WITH ENDOBRONCHIAL ULTRASOUND N/A 08/07/2020   Procedure: VIDEO BRONCHOSCOPY WITH ENDOBRONCHIAL ULTRASOUND;  Surgeon: Tyler Pita, MD;  Location: [facility] ORS;  Service: Pulmonary;  Laterality: N/A;    Social History   Socioeconomic History   Marital status: Divorced    Spouse name: Not on file   Number of children: Not on file   Years of education: Not on file   Highest education level: Not on file  Occupational History   Not on file  Tobacco Use   Smoking status: Every Day    Packs/day: 4.00    Years: 35.00    Total pack years: 140.00    Types: Cigarettes   Smokeless tobacco: Former    Types: Chew   Tobacco comments:    2PPD 08/24/2022    2 PPD 10/04/2022- Fabian Sharp, CMa  Vaping Use   Vaping Use: Never used  Substance and Sexual Activity   Alcohol use: Not Currently    Comment: 1 beer yest and not before 1 month ago   Drug use: No   Sexual activity: Not Currently  Other Topics Concern   Not on file  Social History Narrative   Not on file   Social Determinants of Health   Financial Resource Strain: Not on file  Food Insecurity: Not on file  Transportation Needs: Not on file  Physical Activity: Not on file  Stress: Not on file  Social Connections: Not on file  Intimate Partner Violence: Not on file    Family History   Problem Relation Age of Onset   Diabetes Father    Lung cancer Father 78   Heart attack Father    Throat cancer Maternal Aunt      Current Outpatient Medications:    acetaminophen (TYLENOL) 500 MG tablet, Take 500-1,000 mg by mouth every 6 (six) hours as needed (for pain.)., Disp: , Rfl:    albuterol (PROVENTIL) (2.5 MG/3ML) 0.083% nebulizer solution, Take 3 mLs (2.5 mg total) by nebulization every 6 (six) hours as needed for wheezing or shortness of breath., Disp: 75 mL, Rfl: 2   albuterol (VENTOLIN HFA) 108 (90 Base) MCG/ACT inhaler, TAKE 2 PUFFS BY MOUTH EVERY 6 HOURS AS NEEDED FOR WHEEZE OR SHORTNESS OF BREATH, Disp: 18 each, Rfl: 2   Budeson-Glycopyrrol-Formoterol (BREZTRI AEROSPHERE) 160-9-4.8 MCG/ACT AERO, Inhale 2 puffs into the lungs in the morning and at bedtime., Disp: 5.9 g, Rfl: 0   levothyroxine (SYNTHROID) 137 MCG tablet, Take 1 tablet (137 mcg total) by mouth daily before breakfast., Disp: 90 tablet, Rfl: 1   morphine (MS CONTIN) 30 MG 12 hr tablet, Take 1 tablet (30 mg total) by mouth every 12 (twelve)  hours., Disp: 60 tablet, Rfl: 0   Oxycodone HCl 10 MG TABS, Take 1 tablet (10 mg total) by mouth every 4 (four) hours as needed., Disp: 180 tablet, Rfl: 0   prochlorperazine (COMPAZINE) 10 MG tablet, Take 1 tablet (10 mg total) by mouth every 6 (six) hours as needed for nausea or vomiting. (Patient not taking: Reported on 11/02/2022), Disp: 30 tablet, Rfl: 1  Physical exam:  Vitals:   11/29/22 1531  BP: 99/63  Pulse: 81  Resp: 18  Temp: 98 F (36.7 C)  SpO2: 100%  Weight: 182 lb (82.6 kg)   Physical Exam Constitutional:      General: He is not in acute distress. Cardiovascular:     Rate and Rhythm: Normal rate and regular rhythm.     Heart sounds: Normal heart sounds.  Pulmonary:     Effort: Pulmonary effort is normal.     Breath sounds: Normal breath sounds.  Abdominal:     General: Bowel sounds are normal.     Palpations: Abdomen is soft.  Skin:    General:  Skin is warm and dry.  Neurological:     Mental Status: He is alert and oriented to person, place, and time.         Latest Ref Rng & Units 07/08/2022    2:49 PM  CMP  Glucose 70 - 99 mg/dL 109   BUN 6 - 20 mg/dL 7   Creatinine 0.61 - 1.24 mg/dL 0.95   Sodium 135 - 145 mmol/L 128   Potassium 3.5 - 5.1 mmol/L 3.9   Chloride 98 - 111 mmol/L 94   CO2 22 - 32 mmol/L 27   Calcium 8.9 - 10.3 mg/dL 8.9   Total Protein 6.5 - 8.1 g/dL 7.8   Total Bilirubin 0.3 - 1.2 mg/dL 0.3   Alkaline Phos 38 - 126 U/L 77   AST 15 - 41 U/L 52   ALT 0 - 44 U/L 20       Latest Ref Rng & Units 07/08/2022    2:49 PM  CBC  WBC 4.0 - 10.5 K/uL 9.8   Hemoglobin 13.0 - 17.0 g/dL 10.6   Hematocrit 39.0 - 52.0 % 32.2   Platelets 150 - 400 K/uL 328     No images are attached to the encounter.  ECHOCARDIOGRAM COMPLETE  Result Date: 11/14/2022    ECHOCARDIOGRAM REPORT   Patient Name:   Taelyn TYQUON NEAR Date of Exam: 11/14/2022 Medical Rec #:  [id number]      Height:       75.0 in Accession #:    [id number]     Weight:       188.6 lb Date of Birth:  Jun 25, 1966      BSA:          2.140 m Patient Age:    56 years       BP:           106/67 mmHg Patient Gender: M              HR:           77 bpm. Exam Location:  [facility] Procedure: 2D Echo, Color Doppler, Cardiac Doppler and Strain Analysis Indications:     I3.3 Pericardial effusion  History:         Patient has no prior history of Echocardiogram examinations.                  COPD. Hx of lung cancer and  emphysema.  Sonographer:     Charmayne Sheer Referring Phys:  [id number] Weston Anna Titus Drone Diagnosing Phys: Kathlyn Sacramento MD  Sonographer Comments: Global longitudinal strain was attempted. IMPRESSIONS  1. Left ventricular ejection fraction, by estimation, is 55 to 60%. The left ventricle has normal function. The left ventricle has no regional wall motion abnormalities. Left ventricular diastolic parameters were normal. The global longitudinal strain is normal.  2. Right ventricular  systolic function is normal. The right ventricular size is normal. Tricuspid regurgitation signal is inadequate for assessing PA pressure.  3. A small pericardial effusion is present. The pericardial effusion is circumferential. There is no evidence of cardiac tamponade.  4. The mitral valve is normal in structure. No evidence of mitral valve regurgitation. No evidence of mitral stenosis.  5. The aortic valve is normal in structure. Aortic valve regurgitation is not visualized. No aortic stenosis is present.  6. The inferior vena cava is normal in size with greater than 50% respiratory variability, suggesting right atrial pressure of 3 mmHg. FINDINGS  Left Ventricle: Left ventricular ejection fraction, by estimation, is 55 to 60%. The left ventricle has normal function. The left ventricle has no regional wall motion abnormalities. The global longitudinal strain is normal. The left ventricular internal cavity size was normal in size. There is no left ventricular hypertrophy. Left ventricular diastolic parameters were normal. Right Ventricle: The right ventricular size is normal. No increase in right ventricular wall thickness. Right ventricular systolic function is normal. Tricuspid regurgitation signal is inadequate for assessing PA pressure. Left Atrium: Left atrial size was normal in size. Right Atrium: Right atrial size was normal in size. Pericardium: A small pericardial effusion is present. The pericardial effusion is circumferential. There is no evidence of cardiac tamponade. Mitral Valve: The mitral valve is normal in structure. No evidence of mitral valve regurgitation. No evidence of mitral valve stenosis. Tricuspid Valve: The tricuspid valve is normal in structure. Tricuspid valve regurgitation is trivial. No evidence of tricuspid stenosis. Aortic Valve: The aortic valve is normal in structure. Aortic valve regurgitation is not visualized. No aortic stenosis is present. Aortic valve mean gradient measures  4.0 mmHg. Aortic valve peak gradient measures 7.6 mmHg. Aortic valve area, by VTI measures 2.42 cm. Pulmonic Valve: The pulmonic valve was normal in structure. Pulmonic valve regurgitation is not visualized. No evidence of pulmonic stenosis. Aorta: The aortic root is normal in size and structure. Venous: The inferior vena cava is normal in size with greater than 50% respiratory variability, suggesting right atrial pressure of 3 mmHg. IAS/Shunts: No atrial level shunt detected by color flow Doppler.  LEFT VENTRICLE PLAX 2D LVIDd:         4.50 cm      Diastology LVIDs:         3.40 cm      LV e' medial:    8.49 cm/s LV PW:         1.40 cm      LV E/e' medial:  9.3 LV IVS:        0.70 cm      LV e' lateral:   10.30 cm/s LVOT diam:     2.00 cm      LV E/e' lateral: 7.6 LV SV:         65 LV SV Index:   30 LVOT Area:     3.14 cm  LV Volumes (MOD) LV vol d, MOD A2C: 89.9 ml LV vol d, MOD A4C: 108.0 ml LV vol s, MOD  A2C: 37.1 ml LV vol s, MOD A4C: 41.9 ml LV SV MOD A2C:     52.8 ml LV SV MOD A4C:     108.0 ml LV SV MOD BP:      59.0 ml RIGHT VENTRICLE RV Basal diam:  2.90 cm RV S prime:     9.90 cm/s LEFT ATRIUM             Index        RIGHT ATRIUM           Index LA diam:        3.20 cm 1.50 cm/m   RA Area:     11.40 cm LA Vol (A2C):   56.8 ml 26.54 ml/m  RA Volume:   25.20 ml  11.77 ml/m LA Vol (A4C):   36.7 ml 17.15 ml/m LA Biplane Vol: 49.2 ml 22.99 ml/m  AORTIC VALVE                    PULMONIC VALVE AV Area (Vmax):    2.16 cm     PV Vmax:       1.29 m/s AV Area (Vmean):   2.33 cm     PV Vmean:      92.100 cm/s AV Area (VTI):     2.42 cm     PV VTI:        0.269 m AV Vmax:           138.00 cm/s  PV Peak grad:  6.7 mmHg AV Vmean:          99.000 cm/s  PV Mean grad:  4.0 mmHg AV VTI:            0.269 m AV Peak Grad:      7.6 mmHg AV Mean Grad:      4.0 mmHg LVOT Vmax:         94.90 cm/s LVOT Vmean:        73.400 cm/s LVOT VTI:          0.207 m LVOT/AV VTI ratio: 0.77  AORTA Ao Root diam: 3.30 cm MITRAL VALVE  MV Area (PHT): 3.81 cm    SHUNTS MV Decel Time: 199 msec    Systemic VTI:  0.21 m MV E velocity: 78.60 cm/s  Systemic Diam: 2.00 cm MV A velocity: 57.10 cm/s MV E/A ratio:  1.38 Kathlyn Sacramento MD Electronically signed by Kathlyn Sacramento MD Signature Date/Time: 11/14/2022/1:16:59 PM    Final    MR Brain W Wo Contrast  Result Date: 11/12/2022 CLINICAL DATA:  Lung carcinoma staging EXAM: MRI HEAD WITHOUT AND WITH CONTRAST TECHNIQUE: Multiplanar, multiecho pulse sequences of the brain and surrounding structures were obtained without and with intravenous contrast. CONTRAST:  49m GADAVIST GADOBUTROL 1 MMOL/ML IV SOLN COMPARISON:  01/06/2022 FINDINGS: Brain: No acute infarct, mass effect or extra-axial collection. No acute or chronic hemorrhage. There is multifocal hyperintense T2-weighted signal within the white matter. Parenchymal volume and CSF spaces are normal. The midline structures are normal. Vascular: Major flow voids are preserved. Skull and upper cervical spine: Normal calvarium and skull base. Visualized upper cervical spine and soft tissues are normal. Sinuses/Orbits:No paranasal sinus fluid levels or advanced mucosal thickening. No mastoid or middle ear effusion. Normal orbits. IMPRESSION: 1. No intracranial metastatic disease. 2. Multifocal hyperintense T2-weighted signal within the white matter, nonspecific but most commonly seen in the setting of chronic small vessel ischemia. Electronically Signed   By: KUlyses JarredM.D.   On:  11/12/2022 01:17     Assessment and plan- Patient is a 56 y.o. male with history of stage III squamous cell carcinoma of the left upper lobe s/p concurrent chemoradiation with weekly CarboTaxol followed by maintenance durvalumab ending in March 2023.  He is here to discuss further management of lung cancer  Patient states that he is mentally ready to start treatment for lung cancer but wants to wait until after Christmas.  He understands that he has locally recurrent by  biopsy-proven squamous cell carcinoma involving the left upper lobe.  There is also concern for hypermetabolism in the subcarinal lymph node  Patient received CarboTaxol chemotherapy with concurrent radiation back in September 2021.  Options at this time include retreatment with CarboTaxol radiation versus trying carbo etoposide chemotherapy along with radiation.  Patient is more frail presently and is concerned about potential side effects of treatment.  Weekly CarboTaxol chemotherapy perhaps will be better tolerated in his case as compared to [place] etoposide.  Therefore I will proceed with CarboTaxol weekly regimen following Christmas.  I do not plan to do any maintenance durvalumab again since patient seemed to have progression while he was on durvalumab.  He completed maintenance durvalumab in feb  2023 but portions of left upper lobe did show areas of consolidation even back then retrospectively it is hard to know if this was slowly growing malignancy versus radiation fibrosis versus underlying infection   Visit Diagnosis No diagnosis found.   Dr. Randa Evens, MD, MPH [facility] at [facility] [phone] 12/04/2022 12:03 PM

## 2022-12-04 NOTE — Progress Notes (Signed)
DISCONTINUE ON PATHWAY REGIMEN - Non-Small Cell Lung     A cycle is every 14 days:     Durvalumab   **Always confirm dose/schedule in your pharmacy ordering system**  REASON: Disease Progression PRIOR TREATMENT: [id number]: Durvalumab 10 mg/kg q14 Days x up to 12 Months TREATMENT RESPONSE: Progressive Disease (PD)  START ON PATHWAY REGIMEN - Non-Small Cell Lung     A cycle is every 7 days, concurrent with RT:     Paclitaxel      Carboplatin   **Always confirm dose/schedule in your pharmacy ordering system**  Patient Characteristics: Preoperative or Nonsurgical Candidate (Clinical Staging), Stage III - Nonsurgical Candidate (Nonsquamous and Squamous), PS = 0, 1 Therapeutic Status: Preoperative or Nonsurgical Candidate (Clinical Staging) AJCC T Category: cT3 AJCC N Category: cN1 AJCC M Category: cM0 AJCC 8 Stage Grouping: IIIA ECOG Performance Status: 1 Intent of Therapy: Curative Intent, Discussed with Patient

## 2022-12-05 ENCOUNTER — Ambulatory Visit: Payer: Medicaid Other

## 2022-12-05 ENCOUNTER — Telehealth: Payer: Self-pay | Admitting: *Deleted

## 2022-12-05 NOTE — Telephone Encounter (Signed)
Spoke with the patient's daughter regarding appointment for CT Simulation scheduled for Tuesday  12/06/22 at 1pm.   Daughter stated that she would get in touch with him to let him know.

## 2022-12-06 ENCOUNTER — Ambulatory Visit
Admission: RE | Admit: 2022-12-06 | Discharge: 2022-12-06 | Disposition: A | Discharge status: Home / Self Care | Payer: Medicaid Other | Source: Ambulatory Visit | Attending: Radiation Oncology | Admitting: Radiation Oncology

## 2022-12-06 ENCOUNTER — Ambulatory Visit: Payer: Medicaid Other

## 2022-12-06 DIAGNOSIS — I3139 Other pericardial effusion (noninflammatory): Secondary | ICD-10-CM | POA: Insufficient documentation

## 2022-12-06 DIAGNOSIS — Z808 Family history of malignant neoplasm of other organs or systems: Secondary | ICD-10-CM | POA: Insufficient documentation

## 2022-12-06 DIAGNOSIS — J439 Emphysema, unspecified: Secondary | ICD-10-CM | POA: Diagnosis not present

## 2022-12-06 DIAGNOSIS — R058 Other specified cough: Secondary | ICD-10-CM | POA: Diagnosis not present

## 2022-12-06 DIAGNOSIS — Z28310 Unvaccinated for covid-19: Secondary | ICD-10-CM | POA: Diagnosis not present

## 2022-12-06 DIAGNOSIS — Z9221 Personal history of antineoplastic chemotherapy: Secondary | ICD-10-CM | POA: Insufficient documentation

## 2022-12-06 DIAGNOSIS — Z8249 Family history of ischemic heart disease and other diseases of the circulatory system: Secondary | ICD-10-CM | POA: Insufficient documentation

## 2022-12-06 DIAGNOSIS — Z801 Family history of malignant neoplasm of trachea, bronchus and lung: Secondary | ICD-10-CM | POA: Insufficient documentation

## 2022-12-06 DIAGNOSIS — F17210 Nicotine dependence, cigarettes, uncomplicated: Secondary | ICD-10-CM | POA: Insufficient documentation

## 2022-12-06 DIAGNOSIS — Z79899 Other long term (current) drug therapy: Secondary | ICD-10-CM | POA: Insufficient documentation

## 2022-12-06 DIAGNOSIS — D1803 Hemangioma of intra-abdominal structures: Secondary | ICD-10-CM | POA: Insufficient documentation

## 2022-12-06 DIAGNOSIS — R0609 Other forms of dyspnea: Secondary | ICD-10-CM | POA: Diagnosis not present

## 2022-12-06 DIAGNOSIS — C3412 Malignant neoplasm of upper lobe, left bronchus or lung: Secondary | ICD-10-CM | POA: Diagnosis present

## 2022-12-06 DIAGNOSIS — R0602 Shortness of breath: Secondary | ICD-10-CM | POA: Insufficient documentation

## 2022-12-06 DIAGNOSIS — R5383 Other fatigue: Secondary | ICD-10-CM | POA: Diagnosis not present

## 2022-12-06 DIAGNOSIS — R590 Localized enlarged lymph nodes: Secondary | ICD-10-CM | POA: Insufficient documentation

## 2022-12-06 DIAGNOSIS — I700 Atherosclerosis of aorta: Secondary | ICD-10-CM | POA: Diagnosis not present

## 2022-12-06 DIAGNOSIS — Z833 Family history of diabetes mellitus: Secondary | ICD-10-CM | POA: Insufficient documentation

## 2022-12-06 DIAGNOSIS — Z923 Personal history of irradiation: Secondary | ICD-10-CM | POA: Diagnosis not present

## 2022-12-07 ENCOUNTER — Ambulatory Visit: Payer: Medicaid Other

## 2022-12-08 ENCOUNTER — Ambulatory Visit: Payer: Medicaid Other

## 2022-12-09 ENCOUNTER — Ambulatory Visit: Payer: Medicaid Other

## 2022-12-12 ENCOUNTER — Ambulatory Visit: Payer: Medicaid Other

## 2022-12-13 ENCOUNTER — Ambulatory Visit: Payer: Medicaid Other

## 2022-12-14 ENCOUNTER — Ambulatory Visit: Payer: Medicaid Other

## 2022-12-14 DIAGNOSIS — C3412 Malignant neoplasm of upper lobe, left bronchus or lung: Secondary | ICD-10-CM | POA: Diagnosis not present

## 2022-12-15 ENCOUNTER — Other Ambulatory Visit: Payer: Self-pay | Admitting: *Deleted

## 2022-12-15 ENCOUNTER — Ambulatory Visit: Payer: Medicaid Other

## 2022-12-15 ENCOUNTER — Ambulatory Visit
Admission: RE | Admit: 2022-12-15 | Discharge: 2022-12-15 | Disposition: A | Discharge status: Home / Self Care | Payer: Medicaid Other | Source: Ambulatory Visit | Attending: Radiation Oncology | Admitting: Radiation Oncology

## 2022-12-16 ENCOUNTER — Telehealth: Payer: Self-pay | Admitting: *Deleted

## 2022-12-16 ENCOUNTER — Telehealth: Payer: Self-pay | Admitting: Pulmonary Disease

## 2022-12-16 ENCOUNTER — Ambulatory Visit: Payer: Medicaid Other

## 2022-12-16 MED ORDER — MORPHINE SULFATE ER 30 MG PO TBCR: 30.0000 mg | EXTENDED_RELEASE_TABLET | Freq: Two times a day (BID) | ORAL | 0 refills | Status: DC

## 2022-12-16 MED ORDER — OXYCODONE HCL 10 MG PO TABS: 10.0000 mg | ORAL_TABLET | ORAL | 0 refills | Status: DC | PRN

## 2022-12-16 MED ORDER — ALBUTEROL SULFATE HFA 108 (90 BASE) MCG/ACT IN AERS: INHALATION_SPRAY | RESPIRATORY_TRACT | 2 refills | Status: DC

## 2022-12-16 MED ORDER — BREZTRI AEROSPHERE 160-9-4.8 MCG/ACT IN AERO: 2.0000 | INHALATION_SPRAY | Freq: Two times a day (BID) | RESPIRATORY_TRACT | 5 refills | Status: DC

## 2022-12-16 MED FILL — Dexamethasone Sodium Phosphate Inj 100 MG/10ML: INTRAMUSCULAR | Qty: 1 | Status: AC

## 2022-12-16 NOTE — Telephone Encounter (Signed)
Called pt and no answer and does not have voicemail. Then I called his daughter and let her know that short acting and long acting pain meds are sent to pharmacy.Had to leave the message for her on cell phone

## 2022-12-16 NOTE — Telephone Encounter (Signed)
Albuterol and Wesley Ferguson has been sent to preferred pharmacy.  Pt is aware and voiced his understanding. Nothing further needed.

## 2022-12-16 NOTE — Telephone Encounter (Signed)
PT calling for refills of 2 inhalers. Pls call @ [phone] Out of both   Pharm: [facility]

## 2022-12-20 ENCOUNTER — Ambulatory Visit: Payer: Medicaid Other

## 2022-12-20 ENCOUNTER — Inpatient Hospital Stay: Payer: Medicaid Other | Admitting: Oncology

## 2022-12-20 ENCOUNTER — Telehealth: Payer: Self-pay | Admitting: *Deleted

## 2022-12-20 ENCOUNTER — Inpatient Hospital Stay: Payer: Medicaid Other

## 2022-12-20 NOTE — Telephone Encounter (Signed)
Patients daughter called today to report that patient no longer wishes to undergo treatment.

## 2022-12-21 ENCOUNTER — Ambulatory Visit: Payer: Medicaid Other

## 2022-12-22 ENCOUNTER — Ambulatory Visit: Payer: Medicaid Other

## 2022-12-23 ENCOUNTER — Ambulatory Visit: Payer: Medicaid Other

## 2022-12-27 ENCOUNTER — Ambulatory Visit: Payer: Medicaid Other

## 2022-12-28 ENCOUNTER — Telehealth: Payer: Self-pay | Admitting: Hospice and Palliative Medicine

## 2022-12-28 ENCOUNTER — Ambulatory Visit: Payer: Medicaid Other

## 2022-12-28 NOTE — Telephone Encounter (Signed)
I attempted to call patient to clarify goals. Unable to reach patient by phone. Also unable to leave VM.   VM left for patient's daughter.

## 2022-12-29 ENCOUNTER — Ambulatory Visit: Payer: Medicaid Other

## 2022-12-29 NOTE — Telephone Encounter (Signed)
Unable to leave voicemail for patient as voicemail has not been set up on patient's line.  I did speak with his daughter who says that patient told her that he is not interested in pursuing cancer treatment and just wants to "let it go."  I discussed the option of palliative care versus hospice in the community.  I also offered a virtual visit with patient and family to discuss options of supportive care and clarify goals.  Daughter says that she will speak with patient and likely call us back tomorrow with a better understanding of patient's wishes.

## 2022-12-30 ENCOUNTER — Ambulatory Visit: Payer: Medicaid Other

## 2022-12-30 ENCOUNTER — Encounter: Payer: Self-pay | Admitting: Oncology

## 2023-01-02 ENCOUNTER — Ambulatory Visit: Payer: Medicaid Other

## 2023-01-02 ENCOUNTER — Telehealth: Payer: Self-pay | Admitting: Hospice and Palliative Medicine

## 2023-01-02 NOTE — Telephone Encounter (Signed)
I spoke with patient's daughter.  She says that she has talked with patient and he would like to schedule another visit with Dr. Janese Banks to discuss treatment options.  They are hopeful to schedule this at a time when patient's daughter, [name], can participate.

## 2023-01-03 ENCOUNTER — Ambulatory Visit: Payer: Medicaid Other

## 2023-01-03 ENCOUNTER — Telehealth: Payer: Self-pay | Admitting: Oncology

## 2023-01-03 NOTE — Telephone Encounter (Signed)
Patient's daughter has requested an appointment to discuss possible treatment options for patient. Attempt made to reach her to schedule--VM left.

## 2023-01-04 ENCOUNTER — Ambulatory Visit: Payer: Medicaid Other

## 2023-01-05 ENCOUNTER — Ambulatory Visit: Payer: Medicaid Other

## 2023-01-06 ENCOUNTER — Encounter: Payer: Self-pay | Admitting: Oncology

## 2023-01-06 ENCOUNTER — Ambulatory Visit: Payer: Medicaid Other

## 2023-01-06 ENCOUNTER — Other Ambulatory Visit (HOSPITAL_COMMUNITY): Payer: Self-pay

## 2023-01-09 ENCOUNTER — Ambulatory Visit: Payer: Medicaid Other

## 2023-01-10 ENCOUNTER — Ambulatory Visit: Payer: Medicaid Other

## 2023-01-11 ENCOUNTER — Telehealth: Payer: Self-pay

## 2023-01-11 ENCOUNTER — Ambulatory Visit: Payer: Medicaid Other

## 2023-01-11 ENCOUNTER — Other Ambulatory Visit (HOSPITAL_COMMUNITY): Payer: Self-pay

## 2023-01-11 NOTE — Telephone Encounter (Signed)
PA request received via CMM for Breztri Aerosphere 160-9-4.8MCG/ACT aerosol  PA has been submitted to [employer] and is pending determination.   Key: [id number]

## 2023-01-12 ENCOUNTER — Ambulatory Visit: Payer: Medicaid Other

## 2023-01-12 NOTE — Telephone Encounter (Signed)
PA resubmitted with additional information from provider.  Pending determination  Key: [id number]

## 2023-01-12 NOTE — Telephone Encounter (Signed)
Dr. Gonzalez, please advise. Thanks 

## 2023-01-12 NOTE — Telephone Encounter (Signed)
PA has been DENIED due to :   Here are the policy requirements your request did not meet: Per your health plan's criteria, this drug is covered if you meet the following: One of the following:  (1) You have failed one preferred drug as confirmed by claims history or submission of medical records. The preferred drugs: brand Advair Diskus, Dulera.

## 2023-01-12 NOTE — Telephone Encounter (Signed)
PA has been APPROVED for [employer] from 01/12/2023-01/13/2024

## 2023-01-12 NOTE — Telephone Encounter (Signed)
Routing to pharmacy team,

## 2023-01-12 NOTE — Telephone Encounter (Signed)
Noted.   ATC patient to make him aware. Unable to leave vm due to mailbox not being setup.  Will call back.

## 2023-01-12 NOTE — Telephone Encounter (Signed)
This patient has been on Breztri and stable for over a year now.  Previously had been tried on Symbicort and Dulera but these do not offer LAMA.  He has COPD and will require triple therapy.  Advair, Dulera and Symbicort are not triple therapy.  May consider [employer] assistance program.

## 2023-01-13 ENCOUNTER — Encounter: Payer: Self-pay | Admitting: Oncology

## 2023-01-13 ENCOUNTER — Other Ambulatory Visit: Payer: Self-pay | Admitting: *Deleted

## 2023-01-13 ENCOUNTER — Ambulatory Visit: Payer: Medicaid Other

## 2023-01-13 ENCOUNTER — Inpatient Hospital Stay: Payer: Medicaid Other | Attending: Oncology | Admitting: Oncology

## 2023-01-13 VITALS — BP 103/61 | HR 72 | Temp 96.5°F | Resp 17 | Wt 182.0 lb

## 2023-01-13 DIAGNOSIS — Z801 Family history of malignant neoplasm of trachea, bronchus and lung: Secondary | ICD-10-CM | POA: Insufficient documentation

## 2023-01-13 DIAGNOSIS — C3412 Malignant neoplasm of upper lobe, left bronchus or lung: Secondary | ICD-10-CM | POA: Diagnosis not present

## 2023-01-13 DIAGNOSIS — Z833 Family history of diabetes mellitus: Secondary | ICD-10-CM | POA: Diagnosis not present

## 2023-01-13 DIAGNOSIS — R059 Cough, unspecified: Secondary | ICD-10-CM | POA: Insufficient documentation

## 2023-01-13 DIAGNOSIS — Z7189 Other specified counseling: Secondary | ICD-10-CM | POA: Diagnosis not present

## 2023-01-13 DIAGNOSIS — Z8249 Family history of ischemic heart disease and other diseases of the circulatory system: Secondary | ICD-10-CM | POA: Diagnosis not present

## 2023-01-13 DIAGNOSIS — Z79899 Other long term (current) drug therapy: Secondary | ICD-10-CM | POA: Insufficient documentation

## 2023-01-13 DIAGNOSIS — Z808 Family history of malignant neoplasm of other organs or systems: Secondary | ICD-10-CM | POA: Insufficient documentation

## 2023-01-13 DIAGNOSIS — R0602 Shortness of breath: Secondary | ICD-10-CM | POA: Diagnosis not present

## 2023-01-13 DIAGNOSIS — R0789 Other chest pain: Secondary | ICD-10-CM | POA: Insufficient documentation

## 2023-01-13 DIAGNOSIS — G893 Neoplasm related pain (acute) (chronic): Secondary | ICD-10-CM | POA: Insufficient documentation

## 2023-01-13 DIAGNOSIS — F17210 Nicotine dependence, cigarettes, uncomplicated: Secondary | ICD-10-CM | POA: Diagnosis not present

## 2023-01-13 DIAGNOSIS — R5383 Other fatigue: Secondary | ICD-10-CM | POA: Diagnosis not present

## 2023-01-13 DIAGNOSIS — C3490 Malignant neoplasm of unspecified part of unspecified bronchus or lung: Secondary | ICD-10-CM | POA: Diagnosis not present

## 2023-01-13 MED ORDER — MORPHINE SULFATE ER 30 MG PO TBCR: 30.0000 mg | EXTENDED_RELEASE_TABLET | Freq: Two times a day (BID) | ORAL | 0 refills | Status: DC

## 2023-01-13 MED ORDER — OXYCODONE HCL 10 MG PO TABS: 10.0000 mg | ORAL_TABLET | ORAL | 0 refills | Status: DC | PRN

## 2023-01-13 NOTE — Progress Notes (Signed)
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  He had a chest x-ray followed by a CT scan which showed a 5.7 x 4.4 cm left upper lobe mass along with left hilar adenopathy.  There was a low-density 3.6 lesion noted in the right hepatic lobe which ultimately turned out to be a hemangioma on MRI liver.  PET CT scan showed hypermetabolic activity in the left upper lobe and left hilar nodal tissue.  Subcarinal and right paratracheal lymph nodes with mild hypermetabolic features.   Patient underwent bronchoscopies and biopsies.Left upper lobe biopsy was consistent with non-small cell carcinoma favor squamous cell carcinoma right subcarinal lymph node biopsy was negative for malignancy.   Plan is for concurrent chemoradiation with carbotaxol followed by maintenance durvalumab.  Patient does not want to get a port placed.  He is also refused Covid vaccination   NGS testing showed high tumor mutational burden.  PD-L1 90%.  MSI stable.  Patient PIK3CA and notch2 FCHO2 fusion, T p53   Scans in May 2022 Showed 2 hypermetabolic nodules in the left upper lobe and new hypermetabolic cavitary nodule in the right upper lobe concerning for disease progression.  No evidence of distant metastatic disease.  Patient was seen by Dr. Aggie Cosier and received SBRT to the left  lung with plans for continued monitoring of the right upper lobe lung nodule.  Patient completed maintenance durvalumab in March 2023     Repeat imaging has shown evolving changes in the left upper lobe with findings concerning for infection versus malignancy.  Patient therefore had a bronchoscopy which showed recurrent keratinizing squamous cell carcinoma.  PET CT scan did not show any evidence of distant metastatic disease.  Hypermetabolic necrotic masslike consolidation in the posterior upper lobe with hypermetabolic subcarinal adenopathy in November 2023.   Plan was to treat him with concurrent chemoradiation with weekly CarboTaxol for the second time but patient has been unsure whether he wanted to treatment and has not started any treatment yet  Interval history-patient continues to endorse chronic fatigue exertional shortness of breath and chest wall pain.  ECOG PS- 2 Pain scale- 3 Opioid associated constipation- no  Review of systems- Review of Systems  Constitutional:  Positive for malaise/fatigue. Negative for chills, fever and weight loss.  HENT:  Negative for congestion, ear discharge and nosebleeds.   Eyes:  Negative for blurred vision.  Respiratory:  Positive for cough and shortness of breath. Negative for hemoptysis, sputum production and wheezing.        Chest wall pain  Cardiovascular:  Negative for chest pain, palpitations, orthopnea and claudication.  Gastrointestinal:  Negative for abdominal pain, blood in stool, constipation, diarrhea, heartburn, melena, nausea and vomiting.  Genitourinary:  Negative for dysuria, flank pain, frequency, hematuria and urgency.  Musculoskeletal:  Negative for back pain, joint pain and myalgias.  Skin:  Negative for rash.  Neurological:  Negative for dizziness, tingling, focal weakness, seizures, weakness and headaches.  Endo/Heme/Allergies:  Does not bruise/bleed easily.  Psychiatric/Behavioral:  Negative for depression and suicidal ideas.  The patient does not have insomnia.       No Known Allergies   Past Medical History:  Diagnosis Date   COPD (chronic obstructive pulmonary disease) (HCC)    Emphysema of lung (HCC)    Lung cancer (HCC) 06/2020   Pneumonia      Past Surgical History:  Procedure Laterality Date   BACK SURGERY  2017   lumbar region herniated disc   CARDIAC VALVE REPLACEMENT     ELECTROMAGNETIC NAVIGATION BROCHOSCOPY Left 10/21/2022   Procedure: ELECTROMAGNETIC NAVIGATION BRONCHOSCOPY;  Surgeon: Salena Saner, MD;  Location: [facility] ORS;  Service: Cardiopulmonary;  Laterality: Left;   VIDEO BRONCHOSCOPY WITH ENDOBRONCHIAL NAVIGATION N/A 08/07/2020   Procedure: VIDEO BRONCHOSCOPY WITH ENDOBRONCHIAL NAVIGATION;  Surgeon: Salena Saner, MD;  Location: [facility] ORS;  Service: Pulmonary;  Laterality: N/A;   VIDEO BRONCHOSCOPY WITH ENDOBRONCHIAL ULTRASOUND N/A 08/07/2020   Procedure: VIDEO BRONCHOSCOPY WITH ENDOBRONCHIAL ULTRASOUND;  Surgeon: Salena Saner, MD;  Location: [facility] ORS;  Service: Pulmonary;  Laterality: N/A;    Social History   Socioeconomic History   Marital status: Divorced    Spouse name: Not on file   Number of children: Not on file   Years of education: Not on file   Highest education level: Not on file  Occupational History   Not on file  Tobacco Use   Smoking status: Every Day    Packs/day: 4.00    Years: 35.00    Total pack years: 140.00    Types: Cigarettes   Smokeless tobacco: Former    Types: Chew   Tobacco comments:    2PPD 08/24/2022    2 PPD 10/04/2022- Unk Lightning, CMa  Vaping Use   Vaping Use: Never used  Substance and Sexual Activity   Alcohol use: Not Currently    Comment: 1 beer yest and not before 1 month ago   Drug use: No   Sexual activity: Not Currently  Other Topics Concern   Not on file  Social History Narrative   Not on file   Social Determinants of Health   Financial Resource Strain: Not on file  Food Insecurity: Not on file   Transportation Needs: Not on file  Physical Activity: Not on file  Stress: Not on file  Social Connections: Not on file  Intimate Partner Violence: Not on file    Family History  Problem Relation Age of Onset   Diabetes Father    Lung cancer Father 49   Heart attack Father    Throat cancer Maternal Aunt      Current Outpatient Medications:    acetaminophen (TYLENOL) 500 MG tablet, Take 500-1,000 mg by mouth every 6 (six) hours as needed (for pain.)., Disp: , Rfl:    albuterol (PROVENTIL) (2.5 MG/3ML) 0.083% nebulizer solution, Take 3 mLs (2.5 mg total) by nebulization every 6 (six) hours as needed for wheezing or shortness of breath., Disp: 75 mL, Rfl: 2   albuterol (VENTOLIN HFA) 108 (90 Base) MCG/ACT inhaler, TAKE 2 PUFFS BY MOUTH EVERY 6 HOURS AS NEEDED FOR WHEEZE OR SHORTNESS OF BREATH, Disp: 18 each, Rfl: 2   Budeson-Glycopyrrol-Formoterol (BREZTRI AEROSPHERE) 160-9-4.8 MCG/ACT AERO, Inhale 2 puffs into the lungs in the morning and at bedtime., Disp: 10.7 g, Rfl: 5   levothyroxine (SYNTHROID) 137 MCG tablet, Take 1 tablet (137 mcg total) by  mouth daily before breakfast., Disp: 90 tablet, Rfl: 1   lidocaine-prilocaine (EMLA) cream, Apply to affected area once, Disp: 30 g, Rfl: 3   ondansetron (ZOFRAN) 8 MG tablet, Take 1 tablet (8 mg total) by mouth every 8 (eight) hours as needed for nausea or vomiting. Start on the third day after chemotherapy., Disp: 30 tablet, Rfl: 1   dexamethasone (DECADRON) 4 MG tablet, Take 2 tablets daily for 2 days, start the day after chemotherapy. Take with food. (Patient not taking: Reported on 01/13/2023), Disp: 30 tablet, Rfl: 1   morphine (MS CONTIN) 30 MG 12 hr tablet, Take 1 tablet (30 mg total) by mouth every 12 (twelve) hours., Disp: 60 tablet, Rfl: 0   Oxycodone HCl 10 MG TABS, Take 1 tablet (10 mg total) by mouth every 4 (four) hours as needed., Disp: 180 tablet, Rfl: 0   prochlorperazine (COMPAZINE) 10 MG tablet, Take 1 tablet (10 mg total) by  mouth every 6 (six) hours as needed for nausea or vomiting. (Patient not taking: Reported on 11/02/2022), Disp: 30 tablet, Rfl: 1   prochlorperazine (COMPAZINE) 10 MG tablet, Take 1 tablet (10 mg total) by mouth every 6 (six) hours as needed for nausea or vomiting. (Patient not taking: Reported on 01/13/2023), Disp: 30 tablet, Rfl: 1  Physical exam:  Vitals:   01/13/23 1124  BP: 103/61  Pulse: 72  Resp: 17  Temp: (!) 96.5 F (35.8 C)  TempSrc: Tympanic  SpO2: 100%  Weight: 182 lb (82.6 kg)   Physical Exam Constitutional:      Comments: Appears fatigued  Cardiovascular:     Rate and Rhythm: Normal rate and regular rhythm.     Heart sounds: Normal heart sounds.  Pulmonary:     Effort: Pulmonary effort is normal.     Breath sounds: Normal breath sounds.  Skin:    General: Skin is warm and dry.  Neurological:     Mental Status: He is alert and oriented to person, place, and time.         Latest Ref Rng & Units 07/08/2022    2:49 PM  CMP  Glucose 70 - 99 mg/dL 683   BUN 6 - 20 mg/dL 7   Creatinine 7.29 - 0.21 mg/dL 1.15   Sodium 520 - 802 mmol/L 128   Potassium 3.5 - 5.1 mmol/L 3.9   Chloride 98 - 111 mmol/L 94   CO2 22 - 32 mmol/L 27   Calcium 8.9 - 10.3 mg/dL 8.9   Total Protein 6.5 - 8.1 g/dL 7.8   Total Bilirubin 0.3 - 1.2 mg/dL 0.3   Alkaline Phos 38 - 126 U/L 77   AST 15 - 41 U/L 52   ALT 0 - 44 U/L 20       Latest Ref Rng & Units 07/08/2022    2:49 PM  CBC  WBC 4.0 - 10.5 K/uL 9.8   Hemoglobin 13.0 - 17.0 g/dL 23.3   Hematocrit 61.2 - 52.0 % 32.2   Platelets 150 - 400 K/uL 328       No results found.   Assessment and plan- Patient is a 57 y.o. male with stage III squamous cell carcinoma involving the left upper lobe s/p concurrent chemoradiation followed by maintenance durvalumab now presenting with recurrent left upper lobe lung cancer  It has been close to 3 months since his last scan and patient was undecided whether he wanted to go with second round  of chemoradiation.  I recommend now getting a repeat  PET scan to see if he has any evidence of metastatic disease before proceeding with chemoradiation.  We will plan to get that done in the next 1 to 2 weeks.  If he has no evidence of distant metastatic disease I would recommend concurrent chemoradiation over chemotherapy alone.  Patient has reservations about going through radiation for the second time.  If there is evidence of distant metastatic disease we will plan to get NGS testing to see if patient would be a candidate for any targeted therapy.  Patient verbalized understanding of the plan  Neoplasm related pain: Morphine and oxycodone renewed today.   Visit Diagnosis 1. Malignant neoplasm of unspecified part of unspecified bronchus or lung (HCC)   2. Goals of care, counseling/discussion   3. Neoplasm related pain      Dr. Owens Shark, MD, MPH [facility] at [facility] [phone] 01/13/2023 4:32 PM

## 2023-01-13 NOTE — Progress Notes (Signed)
Patient here for oncology follow-up appointment, concerns of SOB

## 2023-01-13 NOTE — Telephone Encounter (Signed)
ATC X2-unable to leave vm due to mailbox not being setup.  Will close encounter per office protocol.

## 2023-01-14 ENCOUNTER — Encounter: Payer: Self-pay | Admitting: Oncology

## 2023-01-16 ENCOUNTER — Ambulatory Visit: Payer: Medicaid Other

## 2023-01-17 ENCOUNTER — Ambulatory Visit: Payer: Medicaid Other

## 2023-01-18 ENCOUNTER — Ambulatory Visit: Payer: Medicaid Other

## 2023-01-19 ENCOUNTER — Ambulatory Visit: Payer: Medicaid Other

## 2023-01-20 ENCOUNTER — Ambulatory Visit: Payer: Medicaid Other

## 2023-01-21 ENCOUNTER — Encounter: Payer: Self-pay | Admitting: Pulmonary Disease

## 2023-01-23 ENCOUNTER — Ambulatory Visit: Payer: Medicaid Other

## 2023-01-24 ENCOUNTER — Ambulatory Visit: Payer: Medicaid Other

## 2023-01-24 ENCOUNTER — Ambulatory Visit
Admission: RE | Admit: 2023-01-24 | Discharge: 2023-01-24 | Disposition: A | Discharge status: Home / Self Care | Payer: Medicaid Other | Source: Ambulatory Visit | Attending: Oncology | Admitting: Oncology

## 2023-01-24 DIAGNOSIS — I3139 Other pericardial effusion (noninflammatory): Secondary | ICD-10-CM | POA: Diagnosis not present

## 2023-01-24 DIAGNOSIS — N3289 Other specified disorders of bladder: Secondary | ICD-10-CM | POA: Diagnosis not present

## 2023-01-24 DIAGNOSIS — J439 Emphysema, unspecified: Secondary | ICD-10-CM | POA: Diagnosis not present

## 2023-01-24 DIAGNOSIS — C3490 Malignant neoplasm of unspecified part of unspecified bronchus or lung: Secondary | ICD-10-CM | POA: Insufficient documentation

## 2023-01-24 DIAGNOSIS — I700 Atherosclerosis of aorta: Secondary | ICD-10-CM | POA: Insufficient documentation

## 2023-01-24 DIAGNOSIS — J432 Centrilobular emphysema: Secondary | ICD-10-CM | POA: Diagnosis not present

## 2023-01-24 LAB — GLUCOSE, CAPILLARY: Glucose-Capillary: 76 mg/dL (ref 70–99)

## 2023-01-24 MED ORDER — FLUDEOXYGLUCOSE F - 18 (FDG) INJECTION
9.8800 | Freq: Once | INTRAVENOUS | Status: AC
  Administered 2023-01-24: 9.88 via INTRAVENOUS

## 2023-01-25 ENCOUNTER — Ambulatory Visit: Payer: Medicaid Other

## 2023-01-26 ENCOUNTER — Ambulatory Visit: Payer: Medicaid Other

## 2023-01-27 ENCOUNTER — Ambulatory Visit: Payer: Medicaid Other

## 2023-01-30 ENCOUNTER — Ambulatory Visit: Payer: Medicaid Other

## 2023-01-31 ENCOUNTER — Inpatient Hospital Stay (HOSPITAL_BASED_OUTPATIENT_CLINIC_OR_DEPARTMENT_OTHER): Payer: Medicaid Other | Admitting: Oncology

## 2023-01-31 ENCOUNTER — Encounter: Payer: Self-pay | Admitting: Oncology

## 2023-01-31 ENCOUNTER — Ambulatory Visit: Payer: Medicaid Other

## 2023-01-31 ENCOUNTER — Telehealth: Payer: Self-pay | Admitting: *Deleted

## 2023-01-31 VITALS — BP 102/66 | HR 66 | Temp 97.3°F | Resp 18 | Wt 188.0 lb

## 2023-01-31 DIAGNOSIS — Z79890 Hormone replacement therapy (postmenopausal): Secondary | ICD-10-CM | POA: Insufficient documentation

## 2023-01-31 DIAGNOSIS — Z7952 Long term (current) use of systemic steroids: Secondary | ICD-10-CM | POA: Insufficient documentation

## 2023-01-31 DIAGNOSIS — J439 Emphysema, unspecified: Secondary | ICD-10-CM | POA: Insufficient documentation

## 2023-01-31 DIAGNOSIS — R5383 Other fatigue: Secondary | ICD-10-CM | POA: Insufficient documentation

## 2023-01-31 DIAGNOSIS — Z808 Family history of malignant neoplasm of other organs or systems: Secondary | ICD-10-CM | POA: Insufficient documentation

## 2023-01-31 DIAGNOSIS — D1803 Hemangioma of intra-abdominal structures: Secondary | ICD-10-CM | POA: Insufficient documentation

## 2023-01-31 DIAGNOSIS — Z79633 Long term (current) use of mitotic inhibitor: Secondary | ICD-10-CM | POA: Insufficient documentation

## 2023-01-31 DIAGNOSIS — Z833 Family history of diabetes mellitus: Secondary | ICD-10-CM | POA: Insufficient documentation

## 2023-01-31 DIAGNOSIS — I700 Atherosclerosis of aorta: Secondary | ICD-10-CM | POA: Insufficient documentation

## 2023-01-31 DIAGNOSIS — I3139 Other pericardial effusion (noninflammatory): Secondary | ICD-10-CM | POA: Insufficient documentation

## 2023-01-31 DIAGNOSIS — Z79630 Long term (current) use of alkylating agent: Secondary | ICD-10-CM | POA: Insufficient documentation

## 2023-01-31 DIAGNOSIS — Z28310 Unvaccinated for covid-19: Secondary | ICD-10-CM | POA: Insufficient documentation

## 2023-01-31 DIAGNOSIS — C3490 Malignant neoplasm of unspecified part of unspecified bronchus or lung: Secondary | ICD-10-CM | POA: Diagnosis not present

## 2023-01-31 DIAGNOSIS — F17210 Nicotine dependence, cigarettes, uncomplicated: Secondary | ICD-10-CM | POA: Insufficient documentation

## 2023-01-31 DIAGNOSIS — Z79899 Other long term (current) drug therapy: Secondary | ICD-10-CM | POA: Insufficient documentation

## 2023-01-31 DIAGNOSIS — R0789 Other chest pain: Secondary | ICD-10-CM | POA: Insufficient documentation

## 2023-01-31 DIAGNOSIS — J432 Centrilobular emphysema: Secondary | ICD-10-CM | POA: Insufficient documentation

## 2023-01-31 DIAGNOSIS — Z8249 Family history of ischemic heart disease and other diseases of the circulatory system: Secondary | ICD-10-CM | POA: Insufficient documentation

## 2023-01-31 DIAGNOSIS — Z5111 Encounter for antineoplastic chemotherapy: Secondary | ICD-10-CM | POA: Insufficient documentation

## 2023-01-31 DIAGNOSIS — C3412 Malignant neoplasm of upper lobe, left bronchus or lung: Secondary | ICD-10-CM | POA: Insufficient documentation

## 2023-01-31 DIAGNOSIS — Z7189 Other specified counseling: Secondary | ICD-10-CM

## 2023-01-31 DIAGNOSIS — Z801 Family history of malignant neoplasm of trachea, bronchus and lung: Secondary | ICD-10-CM | POA: Insufficient documentation

## 2023-01-31 NOTE — Progress Notes (Signed)
Patient does not have any concerns.

## 2023-01-31 NOTE — Progress Notes (Signed)
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  He had a chest x-ray followed by a CT scan which showed a 5.7 x 4.4 cm left upper lobe mass along with left hilar adenopathy.  There was a low-density 3.6 lesion noted in the right hepatic lobe which ultimately turned out to be a hemangioma on MRI liver.  PET CT scan showed hypermetabolic activity in the left upper lobe and left hilar nodal tissue.  Subcarinal and right paratracheal lymph nodes with mild hypermetabolic features.   Patient underwent bronchoscopies and biopsies.Left upper lobe biopsy was consistent with non-small cell carcinoma favor squamous cell carcinoma right subcarinal lymph node biopsy was negative for malignancy.   Plan is for concurrent chemoradiation with carbotaxol followed by maintenance durvalumab.  Patient does not want to get a port placed.  He is also refused Covid vaccination   NGS testing showed high tumor mutational burden.  PD-L1 90%.  MSI stable.  Patient PIK3CA and notch2 FCHO2 fusion, T p53   Scans in May 2022 Showed 2 hypermetabolic nodules in the left upper lobe and new hypermetabolic cavitary nodule in the right upper lobe concerning for disease progression.  No evidence of distant metastatic disease.  Patient was seen by Dr. Donella Stade and received  SBRT to the left lung with plans for continued monitoring of the right upper lobe lung nodule.  Patient completed maintenance durvalumab in March 2023     Repeat imaging has shown evolving changes in the left upper lobe with findings concerning for infection versus malignancy.  Patient therefore had a bronchoscopy which showed recurrent keratinizing squamous cell carcinoma.  PET CT scan did not show any evidence of distant metastatic disease.  Hypermetabolic necrotic masslike consolidation in the posterior upper lobe with hypermetabolic subcarinal adenopathy in November 2023.   Plan was to treat him with concurrent chemoradiation with weekly CarboTaxol for the second time but patient has been unsure whether he wanted to treatment and has not started any treatment yet    Interval history-he is doing well overall.  Appetite and weight have remained stable.  Chest wall pain is presently well-controlled.  He has baseline fatigue and exertional shortness of breath.  ECOG PS- 1 Pain scale- 2   Review of systems- Review of Systems  Constitutional:  Positive for malaise/fatigue. Negative for chills, fever and weight loss.  HENT:  Negative for congestion, ear discharge and nosebleeds.   Eyes:  Negative for blurred vision.  Respiratory:  Positive for shortness of breath. Negative for cough, hemoptysis, sputum production and wheezing.   Cardiovascular:  Negative for chest pain, palpitations, orthopnea and claudication.  Gastrointestinal:  Negative for abdominal pain, blood in stool, constipation, diarrhea, heartburn, melena, nausea and vomiting.  Genitourinary:  Negative for dysuria, flank pain, frequency, hematuria and urgency.  Musculoskeletal:  Negative for back pain, joint pain  and myalgias.  Skin:  Negative for rash.  Neurological:  Negative for dizziness, tingling, focal weakness, seizures, weakness and headaches.  Endo/Heme/Allergies:  Does not bruise/bleed easily.  Psychiatric/Behavioral:   Negative for depression and suicidal ideas. The patient does not have insomnia.       No Known Allergies   Past Medical History:  Diagnosis Date   COPD (chronic obstructive pulmonary disease) ([place])    Emphysema of lung ([place])    Lung cancer ([place]) 06/2020   Pneumonia      Past Surgical History:  Procedure Laterality Date   BACK SURGERY  2017   lumbar region herniated disc   CARDIAC VALVE REPLACEMENT     ELECTROMAGNETIC NAVIGATION BROCHOSCOPY Left 10/21/2022   Procedure: ELECTROMAGNETIC NAVIGATION BRONCHOSCOPY;  Surgeon: Tyler Pita, MD;  Location: [facility] ORS;  Service: Cardiopulmonary;  Laterality: Left;   VIDEO BRONCHOSCOPY WITH ENDOBRONCHIAL NAVIGATION N/A 08/07/2020   Procedure: VIDEO BRONCHOSCOPY WITH ENDOBRONCHIAL NAVIGATION;  Surgeon: Tyler Pita, MD;  Location: [facility] ORS;  Service: Pulmonary;  Laterality: N/A;   VIDEO BRONCHOSCOPY WITH ENDOBRONCHIAL ULTRASOUND N/A 08/07/2020   Procedure: VIDEO BRONCHOSCOPY WITH ENDOBRONCHIAL ULTRASOUND;  Surgeon: Tyler Pita, MD;  Location: [facility] ORS;  Service: Pulmonary;  Laterality: N/A;    Social History   Socioeconomic History   Marital status: Divorced    Spouse name: Not on file   Number of children: Not on file   Years of education: Not on file   Highest education level: Not on file  Occupational History   Not on file  Tobacco Use   Smoking status: Every Day    Packs/day: 4.00    Years: 35.00    Total pack years: 140.00    Types: Cigarettes   Smokeless tobacco: Former    Types: Chew   Tobacco comments:    2PPD 08/24/2022    2 PPD 10/04/2022- Fabian Sharp, CMa  Vaping Use   Vaping Use: Never used  Substance and Sexual Activity   Alcohol use: Not Currently    Comment: 1 beer yest and not before 1 month ago   Drug use: No   Sexual activity: Not Currently  Other Topics Concern   Not on file  Social History Narrative   Not on file   Social Determinants of Health   Financial Resource Strain:  Not on file  Food Insecurity: Not on file  Transportation Needs: Not on file  Physical Activity: Not on file  Stress: Not on file  Social Connections: Not on file  Intimate Partner Violence: Not on file    Family History  Problem Relation Age of Onset   Diabetes Father    Lung cancer Father 28   Heart attack Father    Throat cancer Maternal Aunt      Current Outpatient Medications:    acetaminophen (TYLENOL) 500 MG tablet, Take 500-1,000 mg by mouth every 6 (six) hours as needed (for pain.)., Disp: , Rfl:    albuterol (PROVENTIL) (2.5 MG/3ML) 0.083% nebulizer solution, Take 3 mLs (2.5 mg total) by nebulization every 6 (six) hours as needed for wheezing or shortness of breath., Disp: 75 mL, Rfl: 2   albuterol (VENTOLIN HFA) 108 (90 Base) MCG/ACT inhaler, TAKE 2 PUFFS BY MOUTH EVERY 6 HOURS AS NEEDED FOR WHEEZE OR SHORTNESS OF BREATH, Disp: 18 each, Rfl: 2   Budeson-Glycopyrrol-Formoterol (BREZTRI AEROSPHERE) 160-9-4.8 MCG/ACT AERO, Inhale 2 puffs into the lungs in the morning and at bedtime., Disp: 10.7 g, Rfl: 5   levothyroxine (SYNTHROID) 137  MCG tablet, Take 1 tablet (137 mcg total) by mouth daily before breakfast., Disp: 90 tablet, Rfl: 1   lidocaine-prilocaine (EMLA) cream, Apply to affected area once, Disp: 30 g, Rfl: 3   morphine (MS CONTIN) 30 MG 12 hr tablet, Take 1 tablet (30 mg total) by mouth every 12 (twelve) hours., Disp: 60 tablet, Rfl: 0   ondansetron (ZOFRAN) 8 MG tablet, Take 1 tablet (8 mg total) by mouth every 8 (eight) hours as needed for nausea or vomiting. Start on the third day after chemotherapy., Disp: 30 tablet, Rfl: 1   Oxycodone HCl 10 MG TABS, Take 1 tablet (10 mg total) by mouth every 4 (four) hours as needed., Disp: 180 tablet, Rfl: 0   dexamethasone (DECADRON) 4 MG tablet, Take 2 tablets daily for 2 days, start the day after chemotherapy. Take with food. (Patient not taking: Reported on 01/13/2023), Disp: 30 tablet, Rfl: 1   prochlorperazine (COMPAZINE) 10  MG tablet, Take 1 tablet (10 mg total) by mouth every 6 (six) hours as needed for nausea or vomiting. (Patient not taking: Reported on 11/02/2022), Disp: 30 tablet, Rfl: 1   prochlorperazine (COMPAZINE) 10 MG tablet, Take 1 tablet (10 mg total) by mouth every 6 (six) hours as needed for nausea or vomiting. (Patient not taking: Reported on 01/13/2023), Disp: 30 tablet, Rfl: 1  Physical exam:  Vitals:   01/31/23 0956  BP: 102/66  Pulse: 66  Resp: 18  Temp: (!) 97.3 F (36.3 C)  TempSrc: Tympanic  SpO2: 100%  Weight: 188 lb (85.3 kg)   Physical Exam Cardiovascular:     Rate and Rhythm: Normal rate and regular rhythm.     Heart sounds: Normal heart sounds.  Pulmonary:     Effort: Pulmonary effort is normal.     Breath sounds: Normal breath sounds.  Skin:    General: Skin is warm and dry.  Neurological:     Mental Status: He is alert and oriented to person, place, and time.         Latest Ref Rng & Units 07/08/2022    2:49 PM  CMP  Glucose 70 - 99 mg/dL 109   BUN 6 - 20 mg/dL 7   Creatinine 0.61 - 1.24 mg/dL 0.95   Sodium 135 - 145 mmol/L 128   Potassium 3.5 - 5.1 mmol/L 3.9   Chloride 98 - 111 mmol/L 94   CO2 22 - 32 mmol/L 27   Calcium 8.9 - 10.3 mg/dL 8.9   Total Protein 6.5 - 8.1 g/dL 7.8   Total Bilirubin 0.3 - 1.2 mg/dL 0.3   Alkaline Phos 38 - 126 U/L 77   AST 15 - 41 U/L 52   ALT 0 - 44 U/L 20       Latest Ref Rng & Units 07/08/2022    2:49 PM  CBC  WBC 4.0 - 10.5 K/uL 9.8   Hemoglobin 13.0 - 17.0 g/dL 10.6   Hematocrit 39.0 - 52.0 % 32.2   Platelets 150 - 400 K/uL 328     No images are attached to the encounter.  NM PET Image Restag (PS) Skull Base To Thigh  Result Date: 01/25/2023 CLINICAL DATA:  Subsequent treatment strategy for lung cancer. EXAM: NUCLEAR MEDICINE PET SKULL BASE TO THIGH TECHNIQUE: 9.88 mCi F-18 FDG was injected intravenously. Full-ring PET imaging was performed from the skull base to thigh after the radiotracer. CT data was obtained and  used for attenuation correction and anatomic localization. Fasting blood glucose: 76 mg/dl  COMPARISON:  Multiple priors including PET-CT October 28, 2022 FINDINGS: Mediastinal blood pool activity: SUV max 1.5 Liver activity: SUV max NA NECK: Hypermetabolic cervical adenopathy. Incidental CT findings: None. CHEST: Masslike necrotic consolidation in the posterior left upper lobe is again increasingly hypermetabolic along the ventral peripheral aspect now with a max SUV of 13.0 previously 11.4. Hypermetabolic subcarinal lymph node measures 11 mm in short axis on image 97/2 with a max SUV of 10.6 previously measuring 1 cm with a max SUV of 8.7. No new hypermetabolic thoracic adenopathy. Incidental CT findings: Similar posttreatment scarring in the anterior segment of the right upper lobe. Centrilobular emphysema. Moderate pericardial effusion is unchanged. Aortic atherosclerosis. ABDOMEN/PELVIS: No abnormal hypermetabolic activity within the liver, pancreas, adrenal glands, or spleen. No hypermetabolic lymph nodes in the abdomen or pelvis. Incidental CT findings: Hypodense lesions in the liver measure up to 2 cm in the right hepatic lobe previously characterized as probable hemangiomas on CT May 03, 2022. Similar bladder wall thickening. Trace pelvic free fluid. Aortic atherosclerosis. SKELETON: No focal hypermetabolic activity to suggest skeletal metastasis. Incidental CT findings: None. IMPRESSION: 1. Increased FDG avidity in the hypermetabolic, necrotic and masslike consolidation in the posterior left upper lobe as well as the subcarinal lymph node, suspicious for disease progression. 2. No new areas of suspicious hypermetabolic activity 3. Moderate pericardial effusion, similar. 4. Aortic Atherosclerosis (ICD10-I70.0) and Emphysema (ICD10-J43.9). Electronically Signed   By: Dahlia Bailiff M.D.   On: 01/25/2023 09:23     Assessment and plan- Patient is a 57 y.o. male with stage III squamous cell carcinoma involving  the left upper lobe s/p concurrent chemoradiation followed by maintenance durvalumab.  He is here to discuss PET CT scan results and further management  I have reviewed PET CT scan images independently and discussed findings with the patient.  PET CT scan confirms hypermetabolism in the left upper lobe as well as subcarinal adenopathy which is gradually worsening.  The subcarinal area was also biopsied and was consistent with recurrent squamous cell carcinoma.  Thankfully patient does not have any evidence of distant metastatic disease.  He has locally recurrent stage III disease.  I have again recommended that concurrent chemoradiation would be more desirable rather than chemotherapy alone.  I would offer him weekly CarboTaxol along with radiation with consideration for 2 cycles of consolidation CarboTaxol chemotherapy following completion of chemoradiation.  Discussed risks and benefits of chemotherapy including all but not limited to nausea, vomiting, low blood counts, risk of infections and hospitalizations.  Treatment will be given with a curative intent.  I will refer him to radiation oncology at this time and tentatively plan to start chemotherapy in 2 weeks time   Visit Diagnosis 1. Malignant neoplasm of unspecified part of unspecified bronchus or lung ([place])   2. Goals of care, counseling/discussion      Dr. Randa Evens, MD, MPH [facility] at [facility] [phone] 01/31/2023 12:28 PM

## 2023-01-31 NOTE — Telephone Encounter (Signed)
Pt made aware of appt with Dr. Baruch Gouty that is scheduled for tomorrow 02/01/23 at 10:30am. Pt confirmed appt.

## 2023-02-01 ENCOUNTER — Ambulatory Visit
Admission: RE | Admit: 2023-02-01 | Discharge: 2023-02-01 | Disposition: A | Discharge status: Home / Self Care | Payer: Medicaid Other | Source: Ambulatory Visit | Attending: Radiation Oncology | Admitting: Radiation Oncology

## 2023-02-01 ENCOUNTER — Encounter: Payer: Self-pay | Admitting: Radiation Oncology

## 2023-02-01 ENCOUNTER — Ambulatory Visit: Payer: Medicaid Other

## 2023-02-01 VITALS — BP 101/66 | HR 74 | Temp 97.3°F | Resp 16 | Ht 74.0 in | Wt 188.0 lb

## 2023-02-01 DIAGNOSIS — C3490 Malignant neoplasm of unspecified part of unspecified bronchus or lung: Secondary | ICD-10-CM | POA: Insufficient documentation

## 2023-02-01 NOTE — Progress Notes (Signed)
Radiation Oncology Follow up Note  Name: Wesley Ferguson   Date:   02/01/2023 MRN:  [id number] DOB: [birth year]    This 57 y.o. 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  To his left lung.  REFERRING PROVIDER: No ref. provider found  HPI: Patient is a 57 year old male well-known to apartment having treated 2 years prior with SBRT to his left lung.  He is recently diagnosed with recurrent or new disease stage IIIa (T2 N2 M0) squamous cell carcinoma by biopsy.  He was recommended for concurrent chemoradiation therapy.  Patient has declined treatment till now saw Dr. Janese Banks yesterday is ready to proceed with concurrent treatment he is doing fairly well specifically [place] cough hemoptysis chest tightness or any change in his pulmonary status.  His most recent PET CT scan at the end of January showed FDG activity in necrotic masslike consolidation the posterior left upper lobe as well as subcarinal lymph node involvement.  No new areas of metastatic disease were noted.  COMPLICATIONS OF TREATMENT: none  FOLLOW UP COMPLIANCE: keeps appointments   PHYSICAL EXAM:  BP 101/66 (BP Location: Left Arm, Patient Position: Sitting, Cuff Size: Normal)   Pulse 74   Temp (!) 97.3 F (36.3 C) (Tympanic)   Resp 16   Ht 6\' 2"  (1.88 m) Comment: Stated Ht  Wt 188 lb (85.3 kg)   BMI 24.14 kg/m  Well-developed well-nourished patient in NAD. HEENT reveals PERLA, EOMI, discs not visualized.  Oral cavity is clear. No oral mucosal lesions are identified. Neck is clear without evidence of cervical or supraclavicular adenopathy. Lungs are clear to A&P. Cardiac examination is essentially unremarkable with regular rate and rhythm without murmur rub or thrill. Abdomen is benign with no organomegaly or masses noted. Motor sensory and DTR levels are equal and symmetric in the upper and lower extremities. Cranial nerves II  through XII are grossly intact. Proprioception is intact. No peripheral adenopathy or edema is identified. No motor or sensory levels are noted. Crude visual fields are within normal range.  RADIOLOGY RESULTS: PET CT scans reviewed compatible with above-stated findings  PLAN: This time elect to go ahead with IMRT treatment to his left lung.  Will plan on delivering 66 Gray in 33 fractions.  I would choose IMRT based on previous areas of treatment close proximity to the lung spinal cord and esophagus.  Will be treating along with concurrent chemotherapy.  There will be extra effort by both professional staff as well as technical staff to coordinate and manage concurrent chemoradiation and ensuing side effects during his treatments. Risk and benefits of treatment including possible cough skin changes alteration blood counts possible radiation esophagitis all were reviewed in detail with the patient.  I have personally set up and ordered CT simulation.  Patient comprehends my recommendations well.  I would like to take this opportunity to thank you for allowing me to participate in the care of your patient.Noreene Filbert, MD

## 2023-02-03 ENCOUNTER — Other Ambulatory Visit: Payer: Self-pay | Admitting: Oncology

## 2023-02-06 ENCOUNTER — Ambulatory Visit
Admission: RE | Admit: 2023-02-06 | Discharge: 2023-02-06 | Disposition: A | Discharge status: Home / Self Care | Payer: Medicaid Other | Source: Ambulatory Visit | Attending: Radiation Oncology | Admitting: Radiation Oncology

## 2023-02-06 DIAGNOSIS — C3490 Malignant neoplasm of unspecified part of unspecified bronchus or lung: Secondary | ICD-10-CM | POA: Diagnosis not present

## 2023-02-13 ENCOUNTER — Ambulatory Visit: Payer: Medicaid Other

## 2023-02-13 ENCOUNTER — Ambulatory Visit: Payer: Medicaid Other | Admitting: Oncology

## 2023-02-13 ENCOUNTER — Other Ambulatory Visit: Payer: Medicaid Other

## 2023-02-13 DIAGNOSIS — C3490 Malignant neoplasm of unspecified part of unspecified bronchus or lung: Secondary | ICD-10-CM | POA: Diagnosis not present

## 2023-02-15 ENCOUNTER — Inpatient Hospital Stay: Payer: Medicaid Other

## 2023-02-15 ENCOUNTER — Ambulatory Visit: Admission: RE | Admit: 2023-02-15 | Payer: Medicaid Other | Source: Ambulatory Visit

## 2023-02-15 ENCOUNTER — Encounter: Payer: Self-pay | Admitting: Oncology

## 2023-02-15 ENCOUNTER — Inpatient Hospital Stay (HOSPITAL_BASED_OUTPATIENT_CLINIC_OR_DEPARTMENT_OTHER): Payer: Medicaid Other | Admitting: Oncology

## 2023-02-15 VITALS — BP 111/69 | HR 72 | Temp 96.4°F | Resp 18 | Ht 74.0 in | Wt 195.3 lb

## 2023-02-15 VITALS — BP 117/76 | HR 63 | Resp 18

## 2023-02-15 DIAGNOSIS — C3490 Malignant neoplasm of unspecified part of unspecified bronchus or lung: Secondary | ICD-10-CM

## 2023-02-15 DIAGNOSIS — Z5111 Encounter for antineoplastic chemotherapy: Secondary | ICD-10-CM | POA: Diagnosis not present

## 2023-02-15 LAB — COMPREHENSIVE METABOLIC PANEL
ALT: 24 U/L (ref 0–44)
AST: 30 U/L (ref 15–41)
Albumin: 4 g/dL (ref 3.5–5.0)
Alkaline Phosphatase: 68 U/L (ref 38–126)
Anion gap: 11 (ref 5–15)
BUN: 8 mg/dL (ref 6–20)
CO2: 26 mmol/L (ref 22–32)
Calcium: 8.8 mg/dL — ABNORMAL LOW (ref 8.9–10.3)
Chloride: 93 mmol/L — ABNORMAL LOW (ref 98–111)
Creatinine, Ser: 0.86 mg/dL (ref 0.61–1.24)
GFR, Estimated: >60 mL/min (ref >60)
Glucose, Bld: 92 mg/dL (ref 70–99)
Potassium: 4 mmol/L (ref 3.5–5.1)
Sodium: 130 mmol/L — ABNORMAL LOW (ref 135–145)
Total Bilirubin: 0.5 mg/dL (ref 0.3–1.2)
Total Protein: 7.7 g/dL (ref 6.5–8.1)

## 2023-02-15 LAB — CBC WITH DIFFERENTIAL/PLATELET
Abs Immature Granulocytes: 0.05 10*3/uL (ref 0.00–0.07)
Basophils Absolute: 0.2 10*3/uL — ABNORMAL HIGH (ref 0.0–0.1)
Basophils Relative: 2 %
Eosinophils Absolute: 0.6 10*3/uL — ABNORMAL HIGH (ref 0.0–0.5)
Eosinophils Relative: 7 %
HCT: 34.9 % — ABNORMAL LOW (ref 39.0–52.0)
Hemoglobin: 11.3 g/dL — ABNORMAL LOW (ref 13.0–17.0)
Immature Granulocytes: 1 %
Lymphocytes Relative: 21 %
Lymphs Abs: 1.8 10*3/uL (ref 0.7–4.0)
MCH: 27.2 pg (ref 26.0–34.0)
MCHC: 32.4 g/dL (ref 30.0–36.0)
MCV: 84.1 fL (ref 80.0–100.0)
Monocytes Absolute: 0.7 10*3/uL (ref 0.1–1.0)
Monocytes Relative: 9 %
Neutro Abs: 5.3 10*3/uL (ref 1.7–7.7)
Neutrophils Relative %: 60 %
Platelets: 327 10*3/uL (ref 150–400)
RBC: 4.15 MIL/uL — ABNORMAL LOW (ref 4.22–5.81)
RDW: 15.4 % (ref 11.5–15.5)
WBC: 8.6 10*3/uL (ref 4.0–10.5)
nRBC: 0 % (ref 0.0–0.2)

## 2023-02-15 MED ORDER — SODIUM CHLORIDE 0.9 % IV SOLN: 269.2000 mg | Freq: Once | INTRAVENOUS | Status: DC

## 2023-02-15 MED ORDER — DIPHENHYDRAMINE HCL 50 MG/ML IJ SOLN
50.0000 mg | Freq: Once | INTRAMUSCULAR | Status: AC
  Administered 2023-02-15: 50 mg via INTRAVENOUS
  Filled 2023-02-15: qty 1

## 2023-02-15 MED ORDER — SODIUM CHLORIDE 0.9 % IV SOLN
45.0000 mg/m2 | Freq: Once | INTRAVENOUS | Status: AC
  Administered 2023-02-15: 96 mg via INTRAVENOUS
  Filled 2023-02-15: qty 16

## 2023-02-15 MED ORDER — SODIUM CHLORIDE 0.9 % IV SOLN
10.0000 mg | Freq: Once | INTRAVENOUS | Status: AC
  Administered 2023-02-15: 10 mg via INTRAVENOUS
  Filled 2023-02-15 (×2): qty 1

## 2023-02-15 MED ORDER — SODIUM CHLORIDE 0.9 % IV SOLN
296.2000 mg | Freq: Once | INTRAVENOUS | Status: AC
  Administered 2023-02-15: 300 mg via INTRAVENOUS
  Filled 2023-02-15: qty 30

## 2023-02-15 MED ORDER — SODIUM CHLORIDE 0.9 % IV SOLN
Freq: Once | INTRAVENOUS | Status: AC
  Filled 2023-02-15: qty 250

## 2023-02-15 MED ORDER — PALONOSETRON HCL INJECTION 0.25 MG/5ML
0.2500 mg | Freq: Once | INTRAVENOUS | Status: AC
  Administered 2023-02-15: 0.25 mg via INTRAVENOUS
  Filled 2023-02-15: qty 5

## 2023-02-15 MED ORDER — FAMOTIDINE IN NACL 20-0.9 MG/50ML-% IV SOLN
20.0000 mg | Freq: Once | INTRAVENOUS | Status: AC
  Administered 2023-02-15: 20 mg via INTRAVENOUS
  Filled 2023-02-15: qty 50

## 2023-02-15 NOTE — Progress Notes (Signed)
No concerns. 

## 2023-02-15 NOTE — Patient Instructions (Signed)
[facility]  Discharge Instructions: Thank you for choosing [facility] to provide your oncology and hematology care.  If you have a lab appointment with the [facility], please go directly to the [facility] and check in at the registration area.  Wear comfortable clothing and clothing appropriate for easy access to any Portacath or PICC line.   We strive to give you quality time with your provider. You may need to reschedule your appointment if you arrive late (15 or more minutes).  Arriving late affects you and other patients whose appointments are after yours.  Also, if you miss three or more appointments without notifying the office, you may be dismissed from the clinic at the provider's discretion.      For prescription refill requests, have your pharmacy contact our office and allow 72 hours for refills to be completed.    Today you received the following chemotherapy and/or immunotherapy agents CARBOPLATIN and TAXOL      To help prevent nausea and vomiting after your treatment, we encourage you to take your nausea medication as directed.  BELOW ARE SYMPTOMS THAT SHOULD BE REPORTED IMMEDIATELY: *FEVER GREATER THAN 100.4 F (38 C) OR HIGHER *CHILLS OR SWEATING *NAUSEA AND VOMITING THAT IS NOT CONTROLLED WITH YOUR NAUSEA MEDICATION *UNUSUAL SHORTNESS OF BREATH *UNUSUAL BRUISING OR BLEEDING *URINARY PROBLEMS (pain or burning when urinating, or frequent urination) *BOWEL PROBLEMS (unusual diarrhea, constipation, pain near the anus) TENDERNESS IN MOUTH AND THROAT WITH OR WITHOUT PRESENCE OF ULCERS (sore throat, sores in mouth, or a toothache) UNUSUAL RASH, SWELLING OR PAIN  UNUSUAL VAGINAL DISCHARGE OR ITCHING   Items with * indicate a potential emergency and should be followed up as soon as possible or go to the Emergency Department if any problems should occur.  Please show the CHEMOTHERAPY ALERT CARD or IMMUNOTHERAPY ALERT CARD at  check-in to the Emergency Department and triage nurse.  Should you have questions after your visit or need to cancel or reschedule your appointment, please contact [facility]  [phone] and follow the prompts.  Office hours are 8:00 a.m. to 4:30 p.m. Monday - Friday. Please note that voicemails left after 4:00 p.m. may not be returned until the following business day.  We are closed weekends and major holidays. You have access to a nurse at all times for urgent questions. Please call the main number to the clinic [phone] and follow the prompts.  For any non-urgent questions, you may also contact your provider using MyChart. We now offer e-Visits for anyone 46 and older to request care online for non-urgent symptoms. For details visit [email or web address].   Also download the MyChart app! Go to the app store, search "MyChart", open the app, select [facility], and log in with your MyChart username and password.  Carboplatin Injection What is this medication? CARBOPLATIN (KAR boe pla tin) treats some types of cancer. It works by slowing down the growth of cancer cells. This medicine may be used for other purposes; ask your health care provider or pharmacist if you have questions. COMMON BRAND NAME(S): Paraplatin What should I tell my care team before I take this medication? They need to know if you have any of these conditions: Blood disorders Hearing problems Kidney disease Recent or ongoing radiation therapy An unusual or allergic reaction to carboplatin, cisplatin, other medications, foods, dyes, or preservatives Pregnant or trying to get pregnant Breast-feeding How should I use this medication? This medication  is injected into a vein. It is given by your care team in a hospital or clinic setting. Talk to your care team about the use of this medication in children. Special care may be needed. Overdosage: If you think you have taken too much of  this medicine contact a poison control center or emergency room at once. NOTE: This medicine is only for you. Do not share this medicine with others. What if I miss a dose? Keep appointments for follow-up doses. It is important not to miss your dose. Call your care team if you are unable to keep an appointment. What may interact with this medication? Medications for seizures Some antibiotics, such as amikacin, gentamicin, neomycin, streptomycin, tobramycin Vaccines This list may not describe all possible interactions. Give your health care provider a list of all the medicines, herbs, non-prescription drugs, or dietary supplements you use. Also tell them if you smoke, drink alcohol, or use illegal drugs. Some items may interact with your medicine. What should I watch for while using this medication? Your condition will be monitored carefully while you are receiving this medication. You may need blood work while taking this medication. This medication may make you feel generally unwell. This is not uncommon, as chemotherapy can affect healthy cells as well as cancer cells. Report any side effects. Continue your course of treatment even though you feel ill unless your care team tells you to stop. In some cases, you may be given additional medications to help with side effects. Follow all directions for their use. This medication may increase your risk of getting an infection. Call your care team for advice if you get a fever, chills, sore throat, or other symptoms of a cold or flu. Do not treat yourself. Try to avoid being around people who are sick. Avoid taking medications that contain aspirin, acetaminophen, ibuprofen, naproxen, or ketoprofen unless instructed by your care team. These medications may hide a fever. Be careful brushing or flossing your teeth or using a toothpick because you may get an infection or bleed more easily. If you have any dental work done, tell your dentist you are receiving  this medication. Talk to your care team if you wish to become pregnant or think you might be pregnant. This medication can cause serious birth defects. Talk to your care team about effective forms of contraception. Do not breast-feed while taking this medication. What side effects may I notice from receiving this medication? Side effects that you should report to your care team as soon as possible: Allergic reactions--skin rash, itching, hives, swelling of the face, lips, tongue, or throat Infection--fever, chills, cough, sore throat, wounds that don't heal, pain or trouble when passing urine, general feeling of discomfort or being unwell Low red blood cell level--unusual wPaclitaxel Injection What is this medication? PACLITAXEL (PAK li TAX el) treats some types of cancer. It works by slowing down the growth of cancer cells. This medicine may be used for other purposes; ask your health care provider or pharmacist if you have questions. COMMON BRAND NAME(S): Onxol, Taxol What should I tell my care team before I take this medication? They need to know if you have any of these conditions: Heart disease Liver disease Low white blood cell levels An unusual or allergic reaction to paclitaxel, other medications, foods, dyes, or preservatives If you or your partner are pregnant or trying to get pregnant Breast-feeding How should I use this medication? This medication is injected into a vein. It is given by your  care team in a hospital or clinic setting. Talk to your care team about the use of this medication in children. While it may be given to children for selected conditions, precautions do apply. Overdosage: If you think you have taken too much of this medicine contact a poison control center or emergency room at once. NOTE: This medicine is only for you. Do not share this medicine with others. What if I miss a dose? Keep appointments for follow-up doses. It is important not to miss your dose.  Call your care team if you are unable to keep an appointment. What may interact with this medication? Do not take this medication with any of the following: Live virus vaccines Other medications may affect the way this medication works. Talk with your care team about all of the medications you take. They may suggest changes to your treatment plan to lower the risk of side effects and to make sure your medications work as intended. This list may not describe all possible interactions. Give your health care provider a list of all the medicines, herbs, non-prescription drugs, or dietary supplements you use. Also tell them if you smoke, drink alcohol, or use illegal drugs. Some items may interact with your medicine. What should I watch for while using this medication? Your condition will be monitored carefully while you are receiving this medication. You may need blood work while taking this medication. This medication may make you feel generally unwell. This is not uncommon as chemotherapy can affect healthy cells as well as cancer cells. Report any side effects. Continue your course of treatment even though you feel ill unless your care team tells you to stop. This medication can cause serious allergic reactions. To reduce the risk, your care team may give you other medications to take before receiving this one. Be sure to follow the directions from your care team. This medication may increase your risk of getting an infection. Call your care team for advice if you get a fever, chills, sore throat, or other symptoms of a cold or flu. Do not treat yourself. Try to avoid being around people who are sick. This medication may increase your risk to bruise or bleed. Call your care team if you notice any unusual bleeding. Be careful brushing or flossing your teeth or using a toothpick because you may get an infection or bleed more easily. If you have any dental work done, tell your dentist you are receiving this  medication. Talk to your care team if you may be pregnant. Serious birth defects can occur if you take this medication during pregnancy. Talk to your care team before breastfeeding. Changes to your treatment plan may be needed. What side effects may I notice from receiving this medication? Side effects that you should report to your care team as soon as possible: Allergic reactions--skin rash, itching, hives, swelling of the face, lips, tongue, or throat Heart rhythm changes--fast or irregular heartbeat, dizziness, feeling faint or lightheaded, chest pain, trouble breathing Increase in blood pressure Infection--fever, chills, cough, sore throat, wounds that don't heal, pain or trouble when passing urine, general feeling of discomfort or being unwell Low blood pressure--dizziness, feeling faint or lightheaded, blurry vision Low red blood cell level--unusual weakness or fatigue, dizziness, headache, trouble breathing Painful swelling, warmth, or redness of the skin, blisters or sores at the infusion site Pain, tingling, or numbness in the hands or feet Slow heartbeat--dizziness, feeling faint or lightheaded, confusion, trouble breathing, unusual weakness or fatigue Unusual bruising or  bleeding Side effects that usually do not require medical attention (report to your care team if they continue or are bothersome): Diarrhea Hair loss Joint pain Loss of appetite Muscle pain Nausea Vomiting This list may not describe all possible side effects. Call your doctor for medical advice about side effects. You may report side effects to FDA at 1-800-FDA-1088. Where should I keep my medication? This medication is given in a hospital or clinic. It will not be stored at home. NOTE: This sheet is a summary. It may not cover all possible information. If you have questions about this medicine, talk to your doctor, pharmacist, or health care provider.  2023 Elsevier/[employer] Standard (2022-04-13  00:00:00) eakness or fatigue, dizziness, headache, trouble breathing Pain, tingling, or numbness in the hands or feet, muscle weakness, change in vision, confusion or trouble speaking, loss of balance or coordination, trouble walking, seizures Unusual bruising or bleeding Side effects that usually do not require medical attention (report to your care team if they continue or are bothersome): Hair loss Nausea Unusual weakness or fatigue Vomiting This list may not describe all possible side effects. Call your doctor for medical advice about side effects. You may report side effects to FDA at 1-800-FDA-1088. Where should I keep my medication? This medication is given in a hospital or clinic. It will not be stored at home. NOTE: This sheet is a summary. It may not cover all possible information. If you have questions about this medicine, talk to your doctor, pharmacist, or health care provider.  2023 [employer]vier/Gold Standard (2022-03-28 00:00:00)

## 2023-02-15 NOTE — Progress Notes (Signed)
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  He had a chest x-ray followed by a CT scan which showed a 5.7 x 4.4 cm left upper lobe mass along with left hilar adenopathy.  There was a low-density 3.6 lesion noted in the right hepatic lobe which ultimately turned out to be a hemangioma on MRI liver.  PET CT scan showed hypermetabolic activity in the left upper lobe and left hilar nodal tissue.  Subcarinal and right paratracheal lymph nodes with mild hypermetabolic features.   Patient underwent bronchoscopies and biopsies.Left upper lobe biopsy was consistent with non-small cell carcinoma favor squamous cell carcinoma right subcarinal lymph node biopsy was negative for malignancy.   Plan is for concurrent chemoradiation with carbotaxol followed by maintenance durvalumab.  Patient does not want to get a port placed.  He is also refused Covid vaccination   NGS testing showed high tumor mutational burden.  PD-L1 90%.  MSI stable.  Patient PIK3CA and notch2 FCHO2 fusion, T p53   Scans in May 2022 Showed 2 hypermetabolic nodules in the left upper lobe and new hypermetabolic cavitary nodule in the right upper lobe concerning for disease progression.  No evidence of distant metastatic disease.  Patient was seen by  Dr. Donella Stade and received SBRT to the left lung with plans for continued monitoring of the right upper lobe lung nodule.  Patient completed maintenance durvalumab in March 2023     Repeat imaging has shown evolving changes in the left upper lobe with findings concerning for infection versus malignancy.  Patient therefore had a bronchoscopy which showed recurrent keratinizing squamous cell carcinoma.  PET CT scan did not show any evidence of distant metastatic disease.  Hypermetabolic necrotic masslike consolidation in the posterior upper lobe with hypermetabolic subcarinal adenopathy in November 2023.   Plan was to treat him with concurrent chemoradiation with weekly CarboTaxol for the second time.   Interval history-he is doing well presently.  He has baseline fatigue exertional shortness of breath.  Occasional chest wall pain for which he uses as needed oxycodone.  ECOG PS- 1 Pain scale- 0   Review of systems- Review of Systems  Constitutional:  Positive for malaise/fatigue. Negative for chills, fever and weight loss.  HENT:  Negative for congestion, ear discharge and nosebleeds.   Eyes:  Negative for blurred vision.  Respiratory:  Positive for shortness of breath. Negative for cough, hemoptysis, sputum production and wheezing.   Cardiovascular:  Negative for chest pain, palpitations, orthopnea and claudication.  Gastrointestinal:  Negative for abdominal pain, blood in stool, constipation, diarrhea, heartburn, melena, nausea and vomiting.  Genitourinary:  Negative for dysuria, flank pain, frequency, hematuria and urgency.  Musculoskeletal:  Negative for back pain, joint pain and myalgias.  Skin:  Negative for rash.  Neurological:  Negative for dizziness, tingling, focal  weakness, seizures, weakness and headaches.  Endo/Heme/Allergies:  Does not bruise/bleed easily.  Psychiatric/Behavioral:  Negative for depression and suicidal ideas. The patient does not have insomnia.       No Known  Allergies   Past Medical History:  Diagnosis Date   COPD (chronic obstructive pulmonary disease) ([place])    Emphysema of lung ([place])    Lung cancer ([place]) 06/2020   Pneumonia      Past Surgical History:  Procedure Laterality Date   BACK SURGERY  2017   lumbar region herniated disc   CARDIAC VALVE REPLACEMENT     ELECTROMAGNETIC NAVIGATION BROCHOSCOPY Left 10/21/2022   Procedure: ELECTROMAGNETIC NAVIGATION BRONCHOSCOPY;  Surgeon: Tyler Pita, MD;  Location: [facility] ORS;  Service: Cardiopulmonary;  Laterality: Left;   VIDEO BRONCHOSCOPY WITH ENDOBRONCHIAL NAVIGATION N/A 08/07/2020   Procedure: VIDEO BRONCHOSCOPY WITH ENDOBRONCHIAL NAVIGATION;  Surgeon: Tyler Pita, MD;  Location: [facility] ORS;  Service: Pulmonary;  Laterality: N/A;   VIDEO BRONCHOSCOPY WITH ENDOBRONCHIAL ULTRASOUND N/A 08/07/2020   Procedure: VIDEO BRONCHOSCOPY WITH ENDOBRONCHIAL ULTRASOUND;  Surgeon: Tyler Pita, MD;  Location: [facility] ORS;  Service: Pulmonary;  Laterality: N/A;    Social History   Socioeconomic History   Marital status: Divorced    Spouse name: Not on file   Number of children: Not on file   Years of education: Not on file   Highest education level: Not on file  Occupational History   Not on file  Tobacco Use   Smoking status: Every Day    Packs/day: 4.00    Years: 35.00    Total pack years: 140.00    Types: Cigarettes   Smokeless tobacco: Former    Types: Chew   Tobacco comments:    2PPD 08/24/2022    2 PPD 10/04/2022- Fabian Sharp, CMa  Vaping Use   Vaping Use: Never used  Substance and Sexual Activity   Alcohol use: Not Currently    Comment: 1 beer yest and not before 1 month ago   Drug use: No   Sexual activity: Not Currently  Other Topics Concern   Not on file  Social History Narrative   Not on file   Social Determinants of Health   Financial Resource Strain: Not on file  Food Insecurity: Not on file  Transportation Needs: Not on file  Physical Activity:  Not on file  Stress: Not on file  Social Connections: Not on file  Intimate Partner Violence: Not on file    Family History  Problem Relation Age of Onset   Diabetes Father    Lung cancer Father 42   Heart attack Father    Throat cancer Maternal Aunt      Current Outpatient Medications:    acetaminophen (TYLENOL) 500 MG tablet, Take 500-1,000 mg by mouth every 6 (six) hours as needed (for pain.)., Disp: , Rfl:    albuterol (PROVENTIL) (2.5 MG/3ML) 0.083% nebulizer solution, Take 3 mLs (2.5 mg total) by nebulization every 6 (six) hours as needed for wheezing or shortness of breath., Disp: 75 mL, Rfl: 2   albuterol (VENTOLIN HFA) 108 (90 Base) MCG/ACT inhaler, TAKE 2 PUFFS BY MOUTH EVERY 6 HOURS AS NEEDED FOR WHEEZE OR SHORTNESS OF BREATH, Disp: 18 each, Rfl: 2   Budeson-Glycopyrrol-Formoterol (BREZTRI AEROSPHERE) 160-9-4.8 MCG/ACT AERO, Inhale 2 puffs into the lungs in the morning and at bedtime., Disp: 10.7 g, Rfl: 5   dexamethasone (DECADRON) 4 MG tablet, Take 2 tablets daily for 2 days, start the day after chemotherapy. Take with  food., Disp: 30 tablet, Rfl: 1   levothyroxine (SYNTHROID) 137 MCG tablet, Take 1 tablet (137 mcg total) by mouth daily before breakfast., Disp: 90 tablet, Rfl: 1   lidocaine-prilocaine (EMLA) cream, Apply to affected area once, Disp: 30 g, Rfl: 3   morphine (MS CONTIN) 30 MG 12 hr tablet, Take 1 tablet (30 mg total) by mouth every 12 (twelve) hours., Disp: 60 tablet, Rfl: 0   ondansetron (ZOFRAN) 8 MG tablet, Take 1 tablet (8 mg total) by mouth every 8 (eight) hours as needed for nausea or vomiting. Start on the third day after chemotherapy., Disp: 30 tablet, Rfl: 1   Oxycodone HCl 10 MG TABS, Take 1 tablet (10 mg total) by mouth every 4 (four) hours as needed., Disp: 180 tablet, Rfl: 0   prochlorperazine (COMPAZINE) 10 MG tablet, Take 1 tablet (10 mg total) by mouth every 6 (six) hours as needed for nausea or vomiting., Disp: 30 tablet, Rfl: 1    prochlorperazine (COMPAZINE) 10 MG tablet, Take 1 tablet (10 mg total) by mouth every 6 (six) hours as needed for nausea or vomiting., Disp: 30 tablet, Rfl: 1 No current facility-administered medications for this visit.  Facility-Administered Medications Ordered in Other Visits:    CARBOplatin (PARAPLATIN) 300 mg in sodium chloride 0.9 % 100 mL chemo infusion, 300 mg, Intravenous, Once, Sindy Guadeloupe, MD   famotidine (PEPCID) IVPB 20 mg premix, 20 mg, Intravenous, Once, Sindy Guadeloupe, MD, Last Rate: 200 mL/hr at 02/15/23 1047, 20 mg at 02/15/23 1047   PACLitaxel (TAXOL) 96 mg in sodium chloride 0.9 % 250 mL chemo infusion (</= 80mg /m2), 45 mg/m2 (Treatment Plan Recorded), Intravenous, Once, Sindy Guadeloupe, MD  Physical exam:  Vitals:   02/15/23 0946  BP: 111/69  Pulse: 72  Resp: 18  Temp: (!) 96.4 F (35.8 C)  TempSrc: Tympanic  SpO2: 99%  Weight: 195 lb 4.8 oz (88.6 kg)  Height: 6\' 2"  (1.88 m)   Physical Exam Cardiovascular:     Rate and Rhythm: Normal rate and regular rhythm.     Heart sounds: Normal heart sounds.  Pulmonary:     Effort: Pulmonary effort is normal.     Breath sounds: Normal breath sounds.  Skin:    General: Skin is warm and dry.  Neurological:     Mental Status: He is alert and oriented to person, place, and time.         Latest Ref Rng & Units 02/15/2023    9:23 AM  CMP  Glucose 70 - 99 mg/dL 92   BUN 6 - 20 mg/dL 8   Creatinine 0.61 - 1.24 mg/dL 0.86   Sodium 135 - 145 mmol/L 130   Potassium 3.5 - 5.1 mmol/L 4.0   Chloride 98 - 111 mmol/L 93   CO2 22 - 32 mmol/L 26   Calcium 8.9 - 10.3 mg/dL 8.8   Total Protein 6.5 - 8.1 g/dL 7.7   Total Bilirubin 0.3 - 1.2 mg/dL 0.5   Alkaline Phos 38 - 126 U/L 68   AST 15 - 41 U/L 30   ALT 0 - 44 U/L 24       Latest Ref Rng & Units 02/15/2023    9:23 AM  CBC  WBC 4.0 - 10.5 K/uL 8.6   Hemoglobin 13.0 - 17.0 g/dL 11.3   Hematocrit 39.0 - 52.0 % 34.9   Platelets 150 - 400 K/uL 327     No images are  attached to the encounter.  NM  PET Image Restag (PS) Skull Base To Thigh  Result Date: 01/25/2023 CLINICAL DATA:  Subsequent treatment strategy for lung cancer. EXAM: NUCLEAR MEDICINE PET SKULL BASE TO THIGH TECHNIQUE: 9.88 mCi F-18 FDG was injected intravenously. Full-ring PET imaging was performed from the skull base to thigh after the radiotracer. CT data was obtained and used for attenuation correction and anatomic localization. Fasting blood glucose: 76 mg/dl COMPARISON:  Multiple priors including PET-CT October 28, 2022 FINDINGS: Mediastinal blood pool activity: SUV max 1.5 Liver activity: SUV max NA NECK: Hypermetabolic cervical adenopathy. Incidental CT findings: None. CHEST: Masslike necrotic consolidation in the posterior left upper lobe is again increasingly hypermetabolic along the ventral peripheral aspect now with a max SUV of 13.0 previously 11.4. Hypermetabolic subcarinal lymph node measures 11 mm in short axis on image 97/2 with a max SUV of 10.6 previously measuring 1 cm with a max SUV of 8.7. No new hypermetabolic thoracic adenopathy. Incidental CT findings: Similar posttreatment scarring in the anterior segment of the right upper lobe. Centrilobular emphysema. Moderate pericardial effusion is unchanged. Aortic atherosclerosis. ABDOMEN/PELVIS: No abnormal hypermetabolic activity within the liver, pancreas, adrenal glands, or spleen. No hypermetabolic lymph nodes in the abdomen or pelvis. Incidental CT findings: Hypodense lesions in the liver measure up to 2 cm in the right hepatic lobe previously characterized as probable hemangiomas on CT May 03, 2022. Similar bladder wall thickening. Trace pelvic free fluid. Aortic atherosclerosis. SKELETON: No focal hypermetabolic activity to suggest skeletal metastasis. Incidental CT findings: None. IMPRESSION: 1. Increased FDG avidity in the hypermetabolic, necrotic and masslike consolidation in the posterior left upper lobe as well as the subcarinal lymph  node, suspicious for disease progression. 2. No new areas of suspicious hypermetabolic activity 3. Moderate pericardial effusion, similar. 4. Aortic Atherosclerosis (ICD10-I70.0) and Emphysema (ICD10-J43.9). Electronically Signed   By: Dahlia Bailiff M.D.   On: 01/25/2023 09:23     Assessment and plan- Patient is a 57 y.o. male with stage III squamous cell carcinoma involving the left upper lobe s/p concurrent chemoradiation followed by maintenance durvalumab.  He has biopsy-proven recurrence in the left lobe and is here for on treatment assessment prior to cycle 1 of weekly CarboTaxol chemotherapy  Counts okay to proceed with cycle 1 of weekly CarboTaxol chemotherapy today.  He will directly proceed for cycle 2 next week and I will see him back in 2 weeks for cycle 3.  Plan is to retreat him with the same regimen that he received before for 7 weeks followed by repeat scans.   Visit Diagnosis 1. Malignant neoplasm of unspecified part of unspecified bronchus or lung ([place])   2. Encounter for antineoplastic chemotherapy      Dr. Randa Evens, MD, MPH [facility] [phone] 02/15/2023 11:06 AM

## 2023-02-16 ENCOUNTER — Other Ambulatory Visit: Payer: Self-pay

## 2023-02-16 ENCOUNTER — Ambulatory Visit
Admission: RE | Admit: 2023-02-16 | Discharge: 2023-02-16 | Disposition: A | Discharge status: Home / Self Care | Payer: Medicaid Other | Source: Ambulatory Visit | Attending: Radiation Oncology | Admitting: Radiation Oncology

## 2023-02-16 DIAGNOSIS — C3490 Malignant neoplasm of unspecified part of unspecified bronchus or lung: Secondary | ICD-10-CM | POA: Diagnosis not present

## 2023-02-16 LAB — RAD ONC ARIA SESSION SUMMARY
Course Elapsed Days: 0
Plan Fractions Treated to Date: 1
Plan Prescribed Dose Per Fraction: 2 Gy
Plan Total Fractions Prescribed: 33
Plan Total Prescribed Dose: 66 Gy
Reference Point Dosage Given to Date: 2 Gy
Reference Point Session Dosage Given: 2 Gy
Session Number: 1

## 2023-02-17 ENCOUNTER — Encounter: Payer: Self-pay | Admitting: Pulmonary Disease

## 2023-02-17 ENCOUNTER — Other Ambulatory Visit: Payer: Self-pay

## 2023-02-17 ENCOUNTER — Ambulatory Visit
Admission: RE | Admit: 2023-02-17 | Discharge: 2023-02-17 | Disposition: A | Discharge status: Home / Self Care | Payer: Medicaid Other | Source: Ambulatory Visit | Attending: Radiation Oncology | Admitting: Radiation Oncology

## 2023-02-17 DIAGNOSIS — C3490 Malignant neoplasm of unspecified part of unspecified bronchus or lung: Secondary | ICD-10-CM | POA: Diagnosis not present

## 2023-02-17 LAB — RAD ONC ARIA SESSION SUMMARY
Course Elapsed Days: 1
Plan Fractions Treated to Date: 2
Plan Prescribed Dose Per Fraction: 2 Gy
Plan Total Fractions Prescribed: 33
Plan Total Prescribed Dose: 66 Gy
Reference Point Dosage Given to Date: 4 Gy
Reference Point Session Dosage Given: 2 Gy
Session Number: 2

## 2023-02-20 ENCOUNTER — Ambulatory Visit: Payer: Medicaid Other

## 2023-02-20 ENCOUNTER — Ambulatory Visit: Payer: Medicaid Other | Admitting: Oncology

## 2023-02-20 ENCOUNTER — Other Ambulatory Visit: Payer: Medicaid Other

## 2023-02-21 ENCOUNTER — Ambulatory Visit
Admission: RE | Admit: 2023-02-21 | Discharge: 2023-02-21 | Disposition: A | Discharge status: Home / Self Care | Payer: Medicaid Other | Source: Ambulatory Visit | Attending: Radiation Oncology | Admitting: Radiation Oncology

## 2023-02-21 ENCOUNTER — Other Ambulatory Visit: Payer: Self-pay

## 2023-02-21 DIAGNOSIS — C3490 Malignant neoplasm of unspecified part of unspecified bronchus or lung: Secondary | ICD-10-CM | POA: Diagnosis not present

## 2023-02-21 LAB — RAD ONC ARIA SESSION SUMMARY
Course Elapsed Days: 5
Plan Fractions Treated to Date: 3
Plan Prescribed Dose Per Fraction: 2 Gy
Plan Total Fractions Prescribed: 33
Plan Total Prescribed Dose: 66 Gy
Reference Point Dosage Given to Date: 6 Gy
Reference Point Session Dosage Given: 2 Gy
Session Number: 3

## 2023-02-21 MED FILL — Dexamethasone Sodium Phosphate Inj 100 MG/10ML: INTRAMUSCULAR | Qty: 1 | Status: AC

## 2023-02-22 ENCOUNTER — Ambulatory Visit
Admission: RE | Admit: 2023-02-22 | Discharge: 2023-02-22 | Disposition: A | Discharge status: Home / Self Care | Payer: Medicaid Other | Source: Ambulatory Visit | Attending: Radiation Oncology | Admitting: Radiation Oncology

## 2023-02-22 ENCOUNTER — Inpatient Hospital Stay: Payer: Medicaid Other

## 2023-02-22 ENCOUNTER — Other Ambulatory Visit: Payer: Self-pay

## 2023-02-22 ENCOUNTER — Other Ambulatory Visit: Payer: Self-pay | Admitting: Oncology

## 2023-02-22 ENCOUNTER — Inpatient Hospital Stay: Payer: Medicaid Other | Admitting: Oncology

## 2023-02-22 ENCOUNTER — Other Ambulatory Visit: Payer: Self-pay | Admitting: *Deleted

## 2023-02-22 VITALS — BP 98/76 | HR 67 | Temp 96.0°F | Resp 18 | Wt 192.1 lb

## 2023-02-22 DIAGNOSIS — C3490 Malignant neoplasm of unspecified part of unspecified bronchus or lung: Secondary | ICD-10-CM

## 2023-02-22 LAB — RAD ONC ARIA SESSION SUMMARY
Course Elapsed Days: 6
Plan Fractions Treated to Date: 4
Plan Prescribed Dose Per Fraction: 2 Gy
Plan Total Fractions Prescribed: 33
Plan Total Prescribed Dose: 66 Gy
Reference Point Dosage Given to Date: 8 Gy
Reference Point Session Dosage Given: 2 Gy
Session Number: 4

## 2023-02-22 LAB — COMPREHENSIVE METABOLIC PANEL
ALT: 22 U/L (ref 0–44)
AST: 25 U/L (ref 15–41)
Albumin: 4 g/dL (ref 3.5–5.0)
Alkaline Phosphatase: 65 U/L (ref 38–126)
Anion gap: 10 (ref 5–15)
BUN: 9 mg/dL (ref 6–20)
CO2: 25 mmol/L (ref 22–32)
Calcium: 8.7 mg/dL — ABNORMAL LOW (ref 8.9–10.3)
Chloride: 92 mmol/L — ABNORMAL LOW (ref 98–111)
Creatinine, Ser: 0.73 mg/dL (ref 0.61–1.24)
GFR, Estimated: >60 mL/min (ref >60)
Glucose, Bld: 101 mg/dL — ABNORMAL HIGH (ref 70–99)
Potassium: 3.7 mmol/L (ref 3.5–5.1)
Sodium: 127 mmol/L — ABNORMAL LOW (ref 135–145)
Total Bilirubin: 0.4 mg/dL (ref 0.3–1.2)
Total Protein: 7.6 g/dL (ref 6.5–8.1)

## 2023-02-22 LAB — CBC WITH DIFFERENTIAL/PLATELET
Abs Immature Granulocytes: 0.07 10*3/uL (ref 0.00–0.07)
Basophils Absolute: 0.1 10*3/uL (ref 0.0–0.1)
Basophils Relative: 1 %
Eosinophils Absolute: 0.7 10*3/uL — ABNORMAL HIGH (ref 0.0–0.5)
Eosinophils Relative: 8 %
HCT: 34.3 % — ABNORMAL LOW (ref 39.0–52.0)
Hemoglobin: 11.4 g/dL — ABNORMAL LOW (ref 13.0–17.0)
Immature Granulocytes: 1 %
Lymphocytes Relative: 14 %
Lymphs Abs: 1.3 10*3/uL (ref 0.7–4.0)
MCH: 27.5 pg (ref 26.0–34.0)
MCHC: 33.2 g/dL (ref 30.0–36.0)
MCV: 82.9 fL (ref 80.0–100.0)
Monocytes Absolute: 0.6 10*3/uL (ref 0.1–1.0)
Monocytes Relative: 6 %
Neutro Abs: 6.7 10*3/uL (ref 1.7–7.7)
Neutrophils Relative %: 70 %
Platelets: 327 10*3/uL (ref 150–400)
RBC: 4.14 MIL/uL — ABNORMAL LOW (ref 4.22–5.81)
RDW: 15.4 % (ref 11.5–15.5)
WBC: 9.5 10*3/uL (ref 4.0–10.5)
nRBC: 0 % (ref 0.0–0.2)

## 2023-02-22 MED ORDER — PALONOSETRON HCL INJECTION 0.25 MG/5ML
0.2500 mg | Freq: Once | INTRAVENOUS | Status: AC
  Administered 2023-02-22: 0.25 mg via INTRAVENOUS
  Filled 2023-02-22: qty 5

## 2023-02-22 MED ORDER — SODIUM CHLORIDE 0.9 % IV SOLN
300.0000 mg | Freq: Once | INTRAVENOUS | Status: AC
  Administered 2023-02-22: 300 mg via INTRAVENOUS
  Filled 2023-02-22: qty 30

## 2023-02-22 MED ORDER — SODIUM CHLORIDE 0.9 % IV SOLN
45.0000 mg/m2 | Freq: Once | INTRAVENOUS | Status: AC
  Administered 2023-02-22: 96 mg via INTRAVENOUS
  Filled 2023-02-22: qty 16

## 2023-02-22 MED ORDER — OXYCODONE HCL 10 MG PO TABS: 10.0000 mg | ORAL_TABLET | ORAL | 0 refills | Status: DC | PRN

## 2023-02-22 MED ORDER — SODIUM CHLORIDE 0.9 % IV SOLN
10.0000 mg | Freq: Once | INTRAVENOUS | Status: AC
  Administered 2023-02-22: 10 mg via INTRAVENOUS
  Filled 2023-02-22: qty 10

## 2023-02-22 MED ORDER — FAMOTIDINE IN NACL 20-0.9 MG/50ML-% IV SOLN
20.0000 mg | Freq: Once | INTRAVENOUS | Status: AC
  Administered 2023-02-22: 20 mg via INTRAVENOUS
  Filled 2023-02-22: qty 50

## 2023-02-22 MED ORDER — SODIUM CHLORIDE 0.9 % IV SOLN
Freq: Once | INTRAVENOUS | Status: AC
  Filled 2023-02-22: qty 250

## 2023-02-22 MED ORDER — DIPHENHYDRAMINE HCL 50 MG/ML IJ SOLN
50.0000 mg | Freq: Once | INTRAMUSCULAR | Status: AC
  Administered 2023-02-22: 50 mg via INTRAVENOUS
  Filled 2023-02-22: qty 1

## 2023-02-22 NOTE — Patient Instructions (Signed)
[facility]  Discharge Instructions: Thank you for choosing [facility] to provide your oncology and hematology care.  If you have a lab appointment with the Cancer Center, please go directly to the Cancer Center and check in at the registration area.  Wear comfortable clothing and clothing appropriate for easy access to any Portacath or PICC line.   We strive to give you quality time with your provider. You may need to reschedule your appointment if you arrive late (15 or more minutes).  Arriving late affects you and other patients whose appointments are after yours.  Also, if you miss three or more appointments without notifying the office, you may be dismissed from the clinic at the provider's discretion.      For prescription refill requests, have your pharmacy contact our office and allow 72 hours for refills to be completed.    Today you received the following chemotherapy and/or immunotherapy agents Taxol and Carboplatin        To help prevent nausea and vomiting after your treatment, we encourage you to take your nausea medication as directed.  BELOW ARE SYMPTOMS THAT SHOULD BE REPORTED IMMEDIATELY: *FEVER GREATER THAN 100.4 F (38 C) OR HIGHER *CHILLS OR SWEATING *NAUSEA AND VOMITING THAT IS NOT CONTROLLED WITH YOUR NAUSEA MEDICATION *UNUSUAL SHORTNESS OF BREATH *UNUSUAL BRUISING OR BLEEDING *URINARY PROBLEMS (pain or burning when urinating, or frequent urination) *BOWEL PROBLEMS (unusual diarrhea, constipation, pain near the anus) TENDERNESS IN MOUTH AND THROAT WITH OR WITHOUT PRESENCE OF ULCERS (sore throat, sores in mouth, or a toothache) UNUSUAL RASH, SWELLING OR PAIN  UNUSUAL VAGINAL DISCHARGE OR ITCHING   Items with * indicate a potential emergency and should be followed up as soon as possible or go to the Emergency Department if any problems should occur.  Please show the CHEMOTHERAPY ALERT CARD or IMMUNOTHERAPY ALERT CARD  at check-in to the Emergency Department and triage nurse.  Should you have questions after your visit or need to cancel or reschedule your appointment, please contact [facility]  [phone] and follow the prompts.  Office hours are 8:00 a.m. to 4:30 p.m. Monday - Friday. Please note that voicemails left after 4:00 p.m. may not be returned until the following business day.  We are closed weekends and major holidays. You have access to a nurse at all times for urgent questions. Please call the main number to the clinic [phone] and follow the prompts.  For any non-urgent questions, you may also contact your provider using MyChart. We now offer e-Visits for anyone 18 and older to request care online for non-urgent symptoms. For details visit [email or web address].   Also download the MyChart app! Go to the app store, search "MyChart", open the app, select [facility], and log in with your MyChart username and password.    

## 2023-02-22 NOTE — Progress Notes (Signed)
Spoke w/ Dr. Janese Banks and should would like for [place] to increase Carboplatin to calculated dose.  Larene Beach, PharmD

## 2023-02-23 ENCOUNTER — Ambulatory Visit
Admission: RE | Admit: 2023-02-23 | Discharge: 2023-02-23 | Disposition: A | Discharge status: Home / Self Care | Payer: Medicaid Other | Source: Ambulatory Visit | Attending: Radiation Oncology | Admitting: Radiation Oncology

## 2023-02-23 ENCOUNTER — Telehealth: Payer: Self-pay | Admitting: Pulmonary Disease

## 2023-02-23 ENCOUNTER — Other Ambulatory Visit: Payer: Self-pay

## 2023-02-23 DIAGNOSIS — C3490 Malignant neoplasm of unspecified part of unspecified bronchus or lung: Secondary | ICD-10-CM | POA: Diagnosis not present

## 2023-02-23 LAB — RAD ONC ARIA SESSION SUMMARY
Course Elapsed Days: 7
Plan Fractions Treated to Date: 5
Plan Prescribed Dose Per Fraction: 2 Gy
Plan Total Fractions Prescribed: 33
Plan Total Prescribed Dose: 66 Gy
Reference Point Dosage Given to Date: 10 Gy
Reference Point Session Dosage Given: 2 Gy
Session Number: 5

## 2023-02-23 MED ORDER — ALBUTEROL SULFATE HFA 108 (90 BASE) MCG/ACT IN AERS: INHALATION_SPRAY | RESPIRATORY_TRACT | 2 refills | Status: DC

## 2023-02-23 MED ORDER — BREZTRI AEROSPHERE 160-9-4.8 MCG/ACT IN AERO: 2.0000 | INHALATION_SPRAY | Freq: Two times a day (BID) | RESPIRATORY_TRACT | 5 refills | Status: DC

## 2023-02-23 NOTE — Telephone Encounter (Signed)
Patient states needs refills for Breztri and Ventolin. Pharmacy is [facility] Patient phone number is [phone].

## 2023-02-23 NOTE — Telephone Encounter (Signed)
I have sent in the prescriptions and notified the patient.  Nothing further needed.

## 2023-02-24 ENCOUNTER — Other Ambulatory Visit: Payer: Self-pay | Admitting: *Deleted

## 2023-02-24 ENCOUNTER — Ambulatory Visit
Admission: RE | Admit: 2023-02-24 | Discharge: 2023-02-24 | Disposition: A | Discharge status: Home / Self Care | Payer: Medicaid Other | Source: Ambulatory Visit | Attending: Radiation Oncology | Admitting: Radiation Oncology

## 2023-02-24 ENCOUNTER — Other Ambulatory Visit: Payer: Self-pay

## 2023-02-24 DIAGNOSIS — C3490 Malignant neoplasm of unspecified part of unspecified bronchus or lung: Secondary | ICD-10-CM | POA: Diagnosis present

## 2023-02-24 LAB — RAD ONC ARIA SESSION SUMMARY
Course Elapsed Days: 8
Plan Fractions Treated to Date: 6
Plan Prescribed Dose Per Fraction: 2 Gy
Plan Total Fractions Prescribed: 33
Plan Total Prescribed Dose: 66 Gy
Reference Point Dosage Given to Date: 12 Gy
Reference Point Session Dosage Given: 2 Gy
Session Number: 6

## 2023-02-24 MED ORDER — PANTOPRAZOLE SODIUM 20 MG PO TBEC: 20.0000 mg | DELAYED_RELEASE_TABLET | Freq: Every day | ORAL | 3 refills | Status: DC

## 2023-02-27 ENCOUNTER — Ambulatory Visit
Admission: RE | Admit: 2023-02-27 | Discharge: 2023-02-27 | Disposition: A | Discharge status: Home / Self Care | Payer: Medicaid Other | Source: Ambulatory Visit | Attending: Radiation Oncology | Admitting: Radiation Oncology

## 2023-02-27 ENCOUNTER — Other Ambulatory Visit: Payer: Self-pay

## 2023-02-27 DIAGNOSIS — C3490 Malignant neoplasm of unspecified part of unspecified bronchus or lung: Secondary | ICD-10-CM | POA: Diagnosis not present

## 2023-02-27 LAB — RAD ONC ARIA SESSION SUMMARY
Course Elapsed Days: 11
Plan Fractions Treated to Date: 7
Plan Prescribed Dose Per Fraction: 2 Gy
Plan Total Fractions Prescribed: 33
Plan Total Prescribed Dose: 66 Gy
Reference Point Dosage Given to Date: 14 Gy
Reference Point Session Dosage Given: 2 Gy
Session Number: 7

## 2023-02-28 ENCOUNTER — Ambulatory Visit: Payer: Medicaid Other

## 2023-02-28 MED FILL — Dexamethasone Sodium Phosphate Inj 100 MG/10ML: INTRAMUSCULAR | Qty: 1 | Status: AC

## 2023-03-01 ENCOUNTER — Encounter: Payer: Self-pay | Admitting: Oncology

## 2023-03-01 ENCOUNTER — Inpatient Hospital Stay: Payer: Medicaid Other | Admitting: Oncology

## 2023-03-01 ENCOUNTER — Inpatient Hospital Stay: Payer: Medicaid Other

## 2023-03-01 ENCOUNTER — Ambulatory Visit: Payer: Medicaid Other

## 2023-03-01 ENCOUNTER — Other Ambulatory Visit: Payer: Self-pay | Admitting: Oncology

## 2023-03-01 DIAGNOSIS — C3490 Malignant neoplasm of unspecified part of unspecified bronchus or lung: Secondary | ICD-10-CM

## 2023-03-02 ENCOUNTER — Ambulatory Visit
Admission: RE | Admit: 2023-03-02 | Discharge: 2023-03-02 | Disposition: A | Discharge status: Home / Self Care | Payer: Medicaid Other | Source: Ambulatory Visit | Attending: Radiation Oncology | Admitting: Radiation Oncology

## 2023-03-02 ENCOUNTER — Other Ambulatory Visit: Payer: Self-pay

## 2023-03-02 DIAGNOSIS — C3490 Malignant neoplasm of unspecified part of unspecified bronchus or lung: Secondary | ICD-10-CM | POA: Diagnosis not present

## 2023-03-02 LAB — RAD ONC ARIA SESSION SUMMARY
Course Elapsed Days: 14
Plan Fractions Treated to Date: 8
Plan Prescribed Dose Per Fraction: 2 Gy
Plan Total Fractions Prescribed: 33
Plan Total Prescribed Dose: 66 Gy
Reference Point Dosage Given to Date: 16 Gy
Reference Point Session Dosage Given: 2 Gy
Session Number: 8

## 2023-03-03 ENCOUNTER — Ambulatory Visit
Admission: RE | Admit: 2023-03-03 | Discharge: 2023-03-03 | Disposition: A | Discharge status: Home / Self Care | Payer: Medicaid Other | Source: Ambulatory Visit | Attending: Radiation Oncology | Admitting: Radiation Oncology

## 2023-03-03 ENCOUNTER — Other Ambulatory Visit: Payer: Self-pay

## 2023-03-03 DIAGNOSIS — C3490 Malignant neoplasm of unspecified part of unspecified bronchus or lung: Secondary | ICD-10-CM | POA: Diagnosis not present

## 2023-03-03 LAB — RAD ONC ARIA SESSION SUMMARY
Course Elapsed Days: 15
Plan Fractions Treated to Date: 9
Plan Prescribed Dose Per Fraction: 2 Gy
Plan Total Fractions Prescribed: 33
Plan Total Prescribed Dose: 66 Gy
Reference Point Dosage Given to Date: 18 Gy
Reference Point Session Dosage Given: 2 Gy
Session Number: 9

## 2023-03-03 MED FILL — Dexamethasone Sodium Phosphate Inj 100 MG/10ML: INTRAMUSCULAR | Qty: 1 | Status: AC

## 2023-03-06 ENCOUNTER — Other Ambulatory Visit: Payer: Self-pay

## 2023-03-06 ENCOUNTER — Inpatient Hospital Stay: Payer: Medicaid Other

## 2023-03-06 ENCOUNTER — Telehealth: Payer: Self-pay | Admitting: *Deleted

## 2023-03-06 ENCOUNTER — Encounter: Payer: Self-pay | Admitting: Nurse Practitioner

## 2023-03-06 ENCOUNTER — Ambulatory Visit: Payer: Medicaid Other

## 2023-03-06 ENCOUNTER — Inpatient Hospital Stay (HOSPITAL_BASED_OUTPATIENT_CLINIC_OR_DEPARTMENT_OTHER): Payer: Medicaid Other | Admitting: Nurse Practitioner

## 2023-03-06 VITALS — BP 101/83 | HR 87 | Temp 97.9°F | Resp 18 | Ht 74.0 in | Wt 192.0 lb

## 2023-03-06 VITALS — BP 129/85 | HR 99 | Resp 20

## 2023-03-06 DIAGNOSIS — R0789 Other chest pain: Secondary | ICD-10-CM | POA: Insufficient documentation

## 2023-03-06 DIAGNOSIS — R5383 Other fatigue: Secondary | ICD-10-CM | POA: Insufficient documentation

## 2023-03-06 DIAGNOSIS — M545 Low back pain, unspecified: Secondary | ICD-10-CM | POA: Insufficient documentation

## 2023-03-06 DIAGNOSIS — Z808 Family history of malignant neoplasm of other organs or systems: Secondary | ICD-10-CM | POA: Insufficient documentation

## 2023-03-06 DIAGNOSIS — R590 Localized enlarged lymph nodes: Secondary | ICD-10-CM | POA: Insufficient documentation

## 2023-03-06 DIAGNOSIS — C3490 Malignant neoplasm of unspecified part of unspecified bronchus or lung: Secondary | ICD-10-CM

## 2023-03-06 DIAGNOSIS — F17210 Nicotine dependence, cigarettes, uncomplicated: Secondary | ICD-10-CM | POA: Insufficient documentation

## 2023-03-06 DIAGNOSIS — D1803 Hemangioma of intra-abdominal structures: Secondary | ICD-10-CM | POA: Insufficient documentation

## 2023-03-06 DIAGNOSIS — Z8249 Family history of ischemic heart disease and other diseases of the circulatory system: Secondary | ICD-10-CM | POA: Insufficient documentation

## 2023-03-06 DIAGNOSIS — R0602 Shortness of breath: Secondary | ICD-10-CM | POA: Insufficient documentation

## 2023-03-06 DIAGNOSIS — Z79899 Other long term (current) drug therapy: Secondary | ICD-10-CM | POA: Insufficient documentation

## 2023-03-06 DIAGNOSIS — D6481 Anemia due to antineoplastic chemotherapy: Secondary | ICD-10-CM

## 2023-03-06 DIAGNOSIS — Z833 Family history of diabetes mellitus: Secondary | ICD-10-CM | POA: Insufficient documentation

## 2023-03-06 DIAGNOSIS — Z79630 Long term (current) use of alkylating agent: Secondary | ICD-10-CM | POA: Insufficient documentation

## 2023-03-06 DIAGNOSIS — Z7952 Long term (current) use of systemic steroids: Secondary | ICD-10-CM | POA: Insufficient documentation

## 2023-03-06 DIAGNOSIS — R110 Nausea: Secondary | ICD-10-CM | POA: Insufficient documentation

## 2023-03-06 DIAGNOSIS — Z79633 Long term (current) use of mitotic inhibitor: Secondary | ICD-10-CM | POA: Insufficient documentation

## 2023-03-06 DIAGNOSIS — Z28310 Unvaccinated for covid-19: Secondary | ICD-10-CM | POA: Insufficient documentation

## 2023-03-06 DIAGNOSIS — Z5111 Encounter for antineoplastic chemotherapy: Secondary | ICD-10-CM | POA: Insufficient documentation

## 2023-03-06 DIAGNOSIS — T451X5A Adverse effect of antineoplastic and immunosuppressive drugs, initial encounter: Secondary | ICD-10-CM

## 2023-03-06 DIAGNOSIS — G8929 Other chronic pain: Secondary | ICD-10-CM | POA: Insufficient documentation

## 2023-03-06 DIAGNOSIS — C3412 Malignant neoplasm of upper lobe, left bronchus or lung: Secondary | ICD-10-CM | POA: Insufficient documentation

## 2023-03-06 DIAGNOSIS — Z801 Family history of malignant neoplasm of trachea, bronchus and lung: Secondary | ICD-10-CM | POA: Insufficient documentation

## 2023-03-06 LAB — CBC WITH DIFFERENTIAL/PLATELET
Abs Immature Granulocytes: 0.04 10*3/uL (ref 0.00–0.07)
Basophils Absolute: 0.1 10*3/uL (ref 0.0–0.1)
Basophils Relative: 1 %
Eosinophils Absolute: 0.2 10*3/uL (ref 0.0–0.5)
Eosinophils Relative: 2 %
HCT: 32.1 % — ABNORMAL LOW (ref 39.0–52.0)
Hemoglobin: 10.8 g/dL — ABNORMAL LOW (ref 13.0–17.0)
Immature Granulocytes: 1 %
Lymphocytes Relative: 14 %
Lymphs Abs: 0.9 10*3/uL (ref 0.7–4.0)
MCH: 27.9 pg (ref 26.0–34.0)
MCHC: 33.6 g/dL (ref 30.0–36.0)
MCV: 82.9 fL (ref 80.0–100.0)
Monocytes Absolute: 0.7 10*3/uL (ref 0.1–1.0)
Monocytes Relative: 11 %
Neutro Abs: 4.6 10*3/uL (ref 1.7–7.7)
Neutrophils Relative %: 71 %
Platelets: 290 10*3/uL (ref 150–400)
RBC: 3.87 MIL/uL — ABNORMAL LOW (ref 4.22–5.81)
RDW: 16.3 % — ABNORMAL HIGH (ref 11.5–15.5)
WBC: 6.4 10*3/uL (ref 4.0–10.5)
nRBC: 0 % (ref 0.0–0.2)

## 2023-03-06 LAB — COMPREHENSIVE METABOLIC PANEL
ALT: 14 U/L (ref 0–44)
AST: 25 U/L (ref 15–41)
Albumin: 4.2 g/dL (ref 3.5–5.0)
Alkaline Phosphatase: 59 U/L (ref 38–126)
Anion gap: 9 (ref 5–15)
BUN: 6 mg/dL (ref 6–20)
CO2: 27 mmol/L (ref 22–32)
Calcium: 8.7 mg/dL — ABNORMAL LOW (ref 8.9–10.3)
Chloride: 91 mmol/L — ABNORMAL LOW (ref 98–111)
Creatinine, Ser: 0.87 mg/dL (ref 0.61–1.24)
GFR, Estimated: >60 mL/min (ref >60)
Glucose, Bld: 107 mg/dL — ABNORMAL HIGH (ref 70–99)
Potassium: 3.9 mmol/L (ref 3.5–5.1)
Sodium: 127 mmol/L — ABNORMAL LOW (ref 135–145)
Total Bilirubin: 0.3 mg/dL (ref 0.3–1.2)
Total Protein: 7.8 g/dL (ref 6.5–8.1)

## 2023-03-06 MED ORDER — SODIUM CHLORIDE 0.9 % IV SOLN
Freq: Once | INTRAVENOUS | Status: AC
  Filled 2023-03-06: qty 250

## 2023-03-06 MED ORDER — PALONOSETRON HCL INJECTION 0.25 MG/5ML
0.2500 mg | Freq: Once | INTRAVENOUS | Status: AC
  Administered 2023-03-06: 0.25 mg via INTRAVENOUS
  Filled 2023-03-06: qty 5

## 2023-03-06 MED ORDER — PROCHLORPERAZINE MALEATE 10 MG PO TABS: 10.0000 mg | ORAL_TABLET | Freq: Four times a day (QID) | ORAL | 1 refills | Status: DC | PRN

## 2023-03-06 MED ORDER — DIPHENHYDRAMINE HCL 50 MG/ML IJ SOLN
50.0000 mg | Freq: Once | INTRAMUSCULAR | Status: AC
  Administered 2023-03-06: 50 mg via INTRAVENOUS
  Filled 2023-03-06: qty 1

## 2023-03-06 MED ORDER — ONDANSETRON HCL 8 MG PO TABS: 8.0000 mg | ORAL_TABLET | Freq: Three times a day (TID) | ORAL | 1 refills | Status: DC | PRN

## 2023-03-06 MED ORDER — SODIUM CHLORIDE 0.9 % IV SOLN
10.0000 mg | Freq: Once | INTRAVENOUS | Status: AC
  Administered 2023-03-06: 10 mg via INTRAVENOUS
  Filled 2023-03-06: qty 1
  Filled 2023-03-06: qty 10

## 2023-03-06 MED ORDER — SODIUM CHLORIDE 0.9 % IV SOLN
281.4000 mg | Freq: Once | INTRAVENOUS | Status: AC
  Administered 2023-03-06: 280 mg via INTRAVENOUS
  Filled 2023-03-06: qty 28

## 2023-03-06 MED ORDER — SODIUM CHLORIDE 0.9 % IV SOLN
45.0000 mg/m2 | Freq: Once | INTRAVENOUS | Status: AC
  Administered 2023-03-06: 96 mg via INTRAVENOUS
  Filled 2023-03-06: qty 16

## 2023-03-06 MED ORDER — FAMOTIDINE IN NACL 20-0.9 MG/50ML-% IV SOLN
20.0000 mg | Freq: Once | INTRAVENOUS | Status: AC
  Administered 2023-03-06: 20 mg via INTRAVENOUS
  Filled 2023-03-06: qty 50

## 2023-03-06 NOTE — Telephone Encounter (Signed)
Left vm for patient's mom to return lauren Zenia Resides, NP's phone call

## 2023-03-06 NOTE — Progress Notes (Signed)
1205, Patient complained of sudden onset of shortness of breath after returning from the restroom. Stopped Carboplatin, which was 2/3 done and called NP. Checked vitals which were stable. Patient used his personal inhaler. [place], NP at chairside. Patient was very emotional and said he still felt short of breath, and he wanted to go home. He did not want to complete his treatment today. He asked Lauren to call his mom to pick him up. Per Beckey Rutter, dc remaining Carboplatin for today. Symptoms improved but patient still upset. IV dc'd and patient discharged to home.

## 2023-03-06 NOTE — Progress Notes (Addendum)
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  He had a chest x-ray followed by a CT scan which showed a 5.7 x 4.4 cm left upper lobe mass along with left hilar adenopathy.  There was a low-density 3.6 lesion noted in the right hepatic lobe which ultimately turned out to be a hemangioma on MRI liver.  PET CT scan showed hypermetabolic activity in the left upper lobe and left hilar nodal tissue.  Subcarinal and right paratracheal lymph nodes with mild hypermetabolic features.   Patient underwent bronchoscopies and biopsies.Left upper lobe biopsy was consistent with non-small cell carcinoma favor squamous cell carcinoma right subcarinal lymph node biopsy was negative for malignancy.   Plan is for concurrent chemoradiation with carbotaxol followed by maintenance durvalumab.  Patient does not want to get a port placed.  He is also refused Covid vaccination   NGS testing showed high tumor mutational burden.  PD-L1 90%.  MSI stable.  Patient PIK3CA and notch2 FCHO2 fusion, T p53   Scans in May 2022 Showed 2 hypermetabolic nodules in the left upper lobe and new hypermetabolic cavitary nodule in the right upper lobe concerning for disease progression.  No evidence of distant metastatic disease.  Patient was seen by Dr.  Donella Stade and received SBRT to the left lung with plans for continued monitoring of the right upper lobe lung nodule.  Patient completed maintenance durvalumab in March 2023     Repeat imaging has shown evolving changes in the left upper lobe with findings concerning for infection versus malignancy.  Patient therefore had a bronchoscopy which showed recurrent keratinizing squamous cell carcinoma.  PET CT scan did not show any evidence of distant metastatic disease.  Hypermetabolic necrotic masslike consolidation in the posterior upper lobe with hypermetabolic subcarinal adenopathy in November 2023.   Plan was to treat him with concurrent chemoradiation with weekly CarboTaxol for the second time.   Interval history- Paitent is 57 year old male with recurrent NSCLC currently undergoing carbo-Taxol chemotherapy with concurrent radiation who presents to clinic for consideration of his third cycle of weekly treatment.  He continues to tolerate treatment well without significant side effects. Reports fatigue. Complains of chronic back pain which is no worse. Nausea is controlled by antiemetics and he requests refills today. No diarrhea or vomiting. Cough and SOB are stable and not worse.   ECOG PS- 1 Pain scale- 8- chronic low back pain; unchanged.    Review of systems- Review of Systems  Constitutional:  Positive for malaise/fatigue. Negative for chills, fever and weight loss.  HENT:  Negative for congestion, ear discharge and nosebleeds.   Eyes:  Negative for blurred vision.  Respiratory:  Positive for shortness of breath. Negative for cough, hemoptysis, sputum production and wheezing.   Cardiovascular:  Negative for chest pain, palpitations, orthopnea and claudication.  Gastrointestinal:  Negative for abdominal pain, blood in stool, constipation, diarrhea, heartburn, melena, nausea and vomiting.  Genitourinary:  Negative for dysuria, flank pain, frequency, hematuria and urgency.  Musculoskeletal:   Positive for back pain. Negative for joint pain and myalgias.  Skin:  Negative for rash.  Neurological:  Negative for dizziness, tingling, focal weakness, seizures, weakness and headaches.  Endo/Heme/Allergies:  Does not bruise/bleed easily.  Psychiatric/Behavioral:  Negative for depression and suicidal ideas. The patient does not have insomnia.      No Known Allergies   Past Medical History:  Diagnosis Date   COPD (chronic obstructive pulmonary disease) ([place])    Emphysema of lung ([place])    Lung cancer ([place]) 06/2020   Pneumonia      Past Surgical History:  Procedure Laterality Date   BACK SURGERY  2017   lumbar region herniated disc   CARDIAC VALVE REPLACEMENT     ELECTROMAGNETIC NAVIGATION BROCHOSCOPY Left 10/21/2022   Procedure: ELECTROMAGNETIC NAVIGATION BRONCHOSCOPY;  Surgeon: Tyler Pita, MD;  Location: [facility] ORS;  Service: Cardiopulmonary;  Laterality: Left;   VIDEO BRONCHOSCOPY WITH ENDOBRONCHIAL NAVIGATION N/A 08/07/2020   Procedure: VIDEO BRONCHOSCOPY WITH ENDOBRONCHIAL NAVIGATION;  Surgeon: Tyler Pita, MD;  Location: [facility] ORS;  Service: Pulmonary;  Laterality: N/A;   VIDEO BRONCHOSCOPY WITH ENDOBRONCHIAL ULTRASOUND N/A 08/07/2020   Procedure: VIDEO BRONCHOSCOPY WITH ENDOBRONCHIAL ULTRASOUND;  Surgeon: Tyler Pita, MD;  Location: [facility] ORS;  Service: Pulmonary;  Laterality: N/A;    Social History   Socioeconomic History   Marital status: Divorced    Spouse name: Not on file   Number of children: Not on file   Years of education: Not on file   Highest education level: Not on file  Occupational History   Not on file  Tobacco Use   Smoking status: Every Day    Packs/day: 4.00    Years: 35.00    Total pack years: 140.00    Types: Cigarettes   Smokeless tobacco: Former    Types: Chew   Tobacco comments:    2PPD 08/24/2022    2 PPD 10/04/2022- Fabian Sharp, CMa  Vaping Use   Vaping Use: Never used  Substance and Sexual Activity    Alcohol use: Not Currently    Comment: 1 beer yest and not before 1 month ago   Drug use: No   Sexual activity: Not Currently  Other Topics Concern   Not on file  Social History Narrative   Not on file   Social Determinants of Health   Financial Resource Strain: Not on file  Food Insecurity: Not on file  Transportation Needs: Not on file  Physical Activity: Not on file  Stress: Not on file  Social Connections: Not on file  Intimate Partner Violence: Not on file    Family History  Problem Relation Age of Onset   Diabetes Father    Lung cancer Father 59   Heart attack Father    Throat cancer Maternal Aunt      Current Outpatient Medications:    acetaminophen (TYLENOL) 500 MG tablet, Take 500-1,000 mg by mouth every 6 (six) hours as needed (for pain.)., Disp: , Rfl:    albuterol (PROVENTIL) (2.5 MG/3ML) 0.083% nebulizer solution, Take 3 mLs (2.5 mg total) by nebulization every 6 (six) hours as needed for wheezing or shortness of breath., Disp: 75 mL, Rfl: 2   albuterol (VENTOLIN HFA) 108 (90 Base) MCG/ACT inhaler, TAKE 2 PUFFS BY MOUTH EVERY 6 HOURS AS NEEDED FOR WHEEZE OR  SHORTNESS OF BREATH, Disp: 18 each, Rfl: 2   Budeson-Glycopyrrol-Formoterol (BREZTRI AEROSPHERE) 160-9-4.8 MCG/ACT AERO, Inhale 2 puffs into the lungs in the morning and at bedtime., Disp: 10.7 g, Rfl: 5   dexamethasone (DECADRON) 4 MG tablet, Take 2 tablets daily for 2 days, start the day after chemotherapy. Take with food., Disp: 30 tablet, Rfl: 1   levothyroxine (SYNTHROID) 137 MCG tablet, Take 1 tablet (137 mcg total) by mouth daily before breakfast., Disp: 90 tablet, Rfl: 1   lidocaine-prilocaine (EMLA) cream, Apply to affected area once, Disp: 30 g, Rfl: 3   morphine (MS CONTIN) 30 MG 12 hr tablet, Take 1 tablet (30 mg total) by mouth every 12 (twelve) hours., Disp: 60 tablet, Rfl: 0   ondansetron (ZOFRAN) 8 MG tablet, Take 1 tablet (8 mg total) by mouth every 8 (eight) hours as needed for nausea or  vomiting. Start on the third day after chemotherapy., Disp: 30 tablet, Rfl: 1   Oxycodone HCl 10 MG TABS, Take 1 tablet (10 mg total) by mouth every 4 (four) hours as needed., Disp: 180 tablet, Rfl: 0   pantoprazole (PROTONIX) 20 MG tablet, Take 1 tablet (20 mg total) by mouth daily. Take the pill in am 1 hour prior to eating food each day, Disp: 30 tablet, Rfl: 3   prochlorperazine (COMPAZINE) 10 MG tablet, Take 1 tablet (10 mg total) by mouth every 6 (six) hours as needed for nausea or vomiting., Disp: 30 tablet, Rfl: 1  Physical exam:  Vitals:   03/06/23 0845  BP: 101/83  Pulse: 87  Resp: 18  Temp: 97.9 F (36.6 C)  TempSrc: Tympanic  SpO2: 100%   Physical Exam Vitals reviewed.  Constitutional:      Appearance: He is not ill-appearing.  Cardiovascular:     Rate and Rhythm: Normal rate and regular rhythm.  Pulmonary:     Effort: Pulmonary effort is normal.     Comments: Diminished air movement bilaterally.  Abdominal:     General: There is no distension.     Tenderness: There is no abdominal tenderness.  Musculoskeletal:     Comments: Ambulates w/o aids  Skin:    General: Skin is warm and dry.     Coloration: Skin is not pale.  Neurological:     Mental Status: He is alert and oriented to person, place, and time.  Psychiatric:        Mood and Affect: Mood normal.        Behavior: Behavior normal.         Latest Ref Rng & Units 03/06/2023    8:23 AM  CMP  Glucose 70 - 99 mg/dL 107   BUN 6 - 20 mg/dL 6   Creatinine 0.61 - 1.24 mg/dL 0.87   Sodium 135 - 145 mmol/L 127   Potassium 3.5 - 5.1 mmol/L 3.9   Chloride 98 - 111 mmol/L 91   CO2 22 - 32 mmol/L 27   Calcium 8.9 - 10.3 mg/dL 8.7   Total Protein 6.5 - 8.1 g/dL 7.8   Total Bilirubin 0.3 - 1.2 mg/dL 0.3   Alkaline Phos 38 - 126 U/L 59   AST 15 - 41 U/L 25   ALT 0 - 44 U/L 14       Latest Ref Rng & Units 03/06/2023    8:23 AM  CBC  WBC 4.0 - 10.5 K/uL 6.4   Hemoglobin 13.0 - 17.0 g/dL 10.8   Hematocrit  39.0 - 52.0 % 32.1  Platelets 150 - 400 K/uL 290     No images are attached to the encounter.  No results found.   Assessment and plan- Patient is a 57 y.o. male with   Recurrent stage III squamous cell carcinoma - diagnosed with stage III SCC of LUL s/p concurrent chemo radiation + maintenance durvalumab. Biopsy proven recurrence in left lobe, now s/p 2 cycles of carbo-taxol chemotherapy with radiation. Labs reviewed today and acceptable for treatment. Proceed with cycle 3 of carbo-taxol chemotherapy.  Fatigue- likely related to malignancy and anemia. Monitor closely Anemia- d/t chemotherapy. Monitor. Nausea- d/t chemotherapy and radiation. Refilled zofran and compazine.   Disposition: 1 week- lab (cbc, cmp) & cycle 4 of carbo-taxol chemotherapy 2 weeks- labs (cbc, cmp), Dr Janese Banks, cycle 5 of carbo-taxol chemotherapy  Addendum: patient received taxol without incident. He had received 2/3 of carbo, ambulated to bathroom and became short of breath. Vitals were normal and he returned to baseline upon rest. However, patient does not want to continue treatment and wishes to discontinue remaining dose of carboplatin today. He also wishes to hold radiation today. Says he feels poorly and wants to go home. He is tearful. He requests that I phone his mother. No answer. Left vm.    Visit Diagnosis 1. Malignant neoplasm of unspecified part of unspecified bronchus or lung ([place])    Beckey Rutter, DNP, AGNP-C, [place] [facility] [phone] (clinic) 03/06/2023  CC: Dr. Janese Banks

## 2023-03-06 NOTE — Patient Instructions (Signed)
[facility] [place] [facility]  Discharge Instructions: Thank you for choosing [facility] to provide your oncology and hematology care.  If you have a lab appointment with the Cancer Center, please go directly to the Cancer Center and check in at the registration area.  Wear comfortable clothing and clothing appropriate for easy access to any Portacath or PICC line.   We strive to give you quality time with your provider. You may need to reschedule your appointment if you arrive late (15 or more minutes).  Arriving late affects you and other patients whose appointments are after yours.  Also, if you miss three or more appointments without notifying the office, you may be dismissed from the clinic at the provider's discretion.      For prescription refill requests, have your pharmacy contact our office and allow 72 hours for refills to be completed.     To help prevent nausea and vomiting after your treatment, we encourage you to take your nausea medication as directed.  BELOW ARE SYMPTOMS THAT SHOULD BE REPORTED IMMEDIATELY: *FEVER GREATER THAN 100.4 F (38 C) OR HIGHER *CHILLS OR SWEATING *NAUSEA AND VOMITING THAT IS NOT CONTROLLED WITH YOUR NAUSEA MEDICATION *UNUSUAL SHORTNESS OF BREATH *UNUSUAL BRUISING OR BLEEDING *URINARY PROBLEMS (pain or burning when urinating, or frequent urination) *BOWEL PROBLEMS (unusual diarrhea, constipation, pain near the anus) TENDERNESS IN MOUTH AND THROAT WITH OR WITHOUT PRESENCE OF ULCERS (sore throat, sores in mouth, or a toothache) UNUSUAL RASH, SWELLING OR PAIN  UNUSUAL VAGINAL DISCHARGE OR ITCHING   Items with * indicate a potential emergency and should be followed up as soon as possible or go to the Emergency Department if any problems should occur.  Please show the CHEMOTHERAPY ALERT CARD or IMMUNOTHERAPY ALERT CARD at check-in to the Emergency Department and triage nurse.  Should you have questions after your visit  or need to cancel or reschedule your appointment, please contact [facility] [place] [facility]  [phone] and follow the prompts.  Office hours are 8:00 a.m. to 4:30 p.m. Monday - Friday. Please note that voicemails left after 4:00 p.m. may not be returned until the following business day.  We are closed weekends and major holidays. You have access to a nurse at all times for urgent questions. Please call the main number to the clinic [phone] and follow the prompts.  For any non-urgent questions, you may also contact your provider using MyChart. We now offer e-Visits for anyone 18 and older to request care online for non-urgent symptoms. For details visit [email or web address].   Also download the MyChart app! Go to the app store, search "MyChart", open the app, select [place], and log in with your MyChart username and password.    

## 2023-03-07 ENCOUNTER — Ambulatory Visit
Admission: RE | Admit: 2023-03-07 | Discharge: 2023-03-07 | Disposition: A | Discharge status: Home / Self Care | Payer: Medicaid Other | Source: Ambulatory Visit | Attending: Radiation Oncology | Admitting: Radiation Oncology

## 2023-03-07 ENCOUNTER — Other Ambulatory Visit: Payer: Self-pay

## 2023-03-07 DIAGNOSIS — C3490 Malignant neoplasm of unspecified part of unspecified bronchus or lung: Secondary | ICD-10-CM | POA: Diagnosis not present

## 2023-03-07 LAB — RAD ONC ARIA SESSION SUMMARY
Course Elapsed Days: 19
Plan Fractions Treated to Date: 10
Plan Prescribed Dose Per Fraction: 2 Gy
Plan Total Fractions Prescribed: 33
Plan Total Prescribed Dose: 66 Gy
Reference Point Dosage Given to Date: 20 Gy
Reference Point Session Dosage Given: 2 Gy
Session Number: 10

## 2023-03-08 ENCOUNTER — Ambulatory Visit
Admission: RE | Admit: 2023-03-08 | Discharge: 2023-03-08 | Disposition: A | Discharge status: Home / Self Care | Payer: Medicaid Other | Source: Ambulatory Visit | Attending: Radiation Oncology | Admitting: Radiation Oncology

## 2023-03-08 ENCOUNTER — Other Ambulatory Visit: Payer: Self-pay

## 2023-03-08 DIAGNOSIS — C3490 Malignant neoplasm of unspecified part of unspecified bronchus or lung: Secondary | ICD-10-CM | POA: Diagnosis not present

## 2023-03-08 LAB — RAD ONC ARIA SESSION SUMMARY
Course Elapsed Days: 20
Plan Fractions Treated to Date: 11
Plan Prescribed Dose Per Fraction: 2 Gy
Plan Total Fractions Prescribed: 33
Plan Total Prescribed Dose: 66 Gy
Reference Point Dosage Given to Date: 22 Gy
Reference Point Session Dosage Given: 2 Gy
Session Number: 11

## 2023-03-09 ENCOUNTER — Other Ambulatory Visit: Payer: Self-pay

## 2023-03-09 ENCOUNTER — Ambulatory Visit
Admission: RE | Admit: 2023-03-09 | Discharge: 2023-03-09 | Disposition: A | Discharge status: Home / Self Care | Payer: Medicaid Other | Source: Ambulatory Visit | Attending: Radiation Oncology | Admitting: Radiation Oncology

## 2023-03-09 ENCOUNTER — Encounter: Payer: Self-pay | Admitting: Oncology

## 2023-03-09 DIAGNOSIS — C3490 Malignant neoplasm of unspecified part of unspecified bronchus or lung: Secondary | ICD-10-CM | POA: Diagnosis not present

## 2023-03-09 LAB — RAD ONC ARIA SESSION SUMMARY
Course Elapsed Days: 21
Plan Fractions Treated to Date: 12
Plan Prescribed Dose Per Fraction: 2 Gy
Plan Total Fractions Prescribed: 33
Plan Total Prescribed Dose: 66 Gy
Reference Point Dosage Given to Date: 24 Gy
Reference Point Session Dosage Given: 2 Gy
Session Number: 12

## 2023-03-10 ENCOUNTER — Ambulatory Visit
Admission: RE | Admit: 2023-03-10 | Discharge: 2023-03-10 | Disposition: A | Discharge status: Home / Self Care | Payer: Medicaid Other | Source: Ambulatory Visit | Attending: Radiation Oncology | Admitting: Radiation Oncology

## 2023-03-10 ENCOUNTER — Ambulatory Visit: Payer: Medicaid Other

## 2023-03-10 ENCOUNTER — Other Ambulatory Visit: Payer: Self-pay

## 2023-03-10 DIAGNOSIS — C3490 Malignant neoplasm of unspecified part of unspecified bronchus or lung: Secondary | ICD-10-CM | POA: Diagnosis not present

## 2023-03-10 LAB — RAD ONC ARIA SESSION SUMMARY
Course Elapsed Days: 22
Plan Fractions Treated to Date: 13
Plan Prescribed Dose Per Fraction: 2 Gy
Plan Total Fractions Prescribed: 33
Plan Total Prescribed Dose: 66 Gy
Reference Point Dosage Given to Date: 26 Gy
Reference Point Session Dosage Given: 2 Gy
Session Number: 13

## 2023-03-10 MED FILL — Dexamethasone Sodium Phosphate Inj 100 MG/10ML: INTRAMUSCULAR | Qty: 1 | Status: AC

## 2023-03-13 ENCOUNTER — Inpatient Hospital Stay: Payer: Medicaid Other

## 2023-03-13 ENCOUNTER — Ambulatory Visit: Payer: Medicaid Other

## 2023-03-13 ENCOUNTER — Inpatient Hospital Stay: Payer: Medicaid Other | Admitting: Oncology

## 2023-03-14 ENCOUNTER — Other Ambulatory Visit: Payer: Self-pay

## 2023-03-14 ENCOUNTER — Ambulatory Visit
Admission: RE | Admit: 2023-03-14 | Discharge: 2023-03-14 | Disposition: A | Discharge status: Home / Self Care | Payer: Medicaid Other | Source: Ambulatory Visit | Attending: Radiation Oncology | Admitting: Radiation Oncology

## 2023-03-14 ENCOUNTER — Ambulatory Visit: Payer: Medicaid Other

## 2023-03-14 ENCOUNTER — Other Ambulatory Visit: Payer: Self-pay | Admitting: *Deleted

## 2023-03-14 DIAGNOSIS — C3490 Malignant neoplasm of unspecified part of unspecified bronchus or lung: Secondary | ICD-10-CM | POA: Diagnosis not present

## 2023-03-14 LAB — RAD ONC ARIA SESSION SUMMARY
Course Elapsed Days: 26
Plan Fractions Treated to Date: 14
Plan Prescribed Dose Per Fraction: 2 Gy
Plan Total Fractions Prescribed: 33
Plan Total Prescribed Dose: 66 Gy
Reference Point Dosage Given to Date: 28 Gy
Reference Point Session Dosage Given: 2 Gy
Session Number: 14

## 2023-03-14 MED ORDER — SUCRALFATE 1 G PO TABS: 1.0000 g | ORAL_TABLET | Freq: Three times a day (TID) | ORAL | 2 refills | Status: DC

## 2023-03-15 ENCOUNTER — Ambulatory Visit
Admission: RE | Admit: 2023-03-15 | Discharge: 2023-03-15 | Disposition: A | Discharge status: Home / Self Care | Payer: Medicaid Other | Source: Ambulatory Visit | Attending: Radiation Oncology | Admitting: Radiation Oncology

## 2023-03-15 ENCOUNTER — Other Ambulatory Visit: Payer: Self-pay

## 2023-03-15 DIAGNOSIS — C3490 Malignant neoplasm of unspecified part of unspecified bronchus or lung: Secondary | ICD-10-CM | POA: Diagnosis not present

## 2023-03-15 LAB — RAD ONC ARIA SESSION SUMMARY
Course Elapsed Days: 27
Plan Fractions Treated to Date: 15
Plan Prescribed Dose Per Fraction: 2 Gy
Plan Total Fractions Prescribed: 33
Plan Total Prescribed Dose: 66 Gy
Reference Point Dosage Given to Date: 30 Gy
Reference Point Session Dosage Given: 2 Gy
Session Number: 15

## 2023-03-16 ENCOUNTER — Other Ambulatory Visit: Payer: Self-pay

## 2023-03-16 ENCOUNTER — Ambulatory Visit
Admission: RE | Admit: 2023-03-16 | Discharge: 2023-03-16 | Disposition: A | Discharge status: Home / Self Care | Payer: Medicaid Other | Source: Ambulatory Visit | Attending: Radiation Oncology | Admitting: Radiation Oncology

## 2023-03-16 DIAGNOSIS — C3490 Malignant neoplasm of unspecified part of unspecified bronchus or lung: Secondary | ICD-10-CM | POA: Diagnosis not present

## 2023-03-16 LAB — RAD ONC ARIA SESSION SUMMARY
Course Elapsed Days: 28
Plan Fractions Treated to Date: 16
Plan Prescribed Dose Per Fraction: 2 Gy
Plan Total Fractions Prescribed: 33
Plan Total Prescribed Dose: 66 Gy
Reference Point Dosage Given to Date: 32 Gy
Reference Point Session Dosage Given: 2 Gy
Session Number: 16

## 2023-03-17 ENCOUNTER — Other Ambulatory Visit: Payer: Self-pay

## 2023-03-17 ENCOUNTER — Ambulatory Visit
Admission: RE | Admit: 2023-03-17 | Discharge: 2023-03-17 | Disposition: A | Discharge status: Home / Self Care | Payer: Medicaid Other | Source: Ambulatory Visit | Attending: Radiation Oncology | Admitting: Radiation Oncology

## 2023-03-17 DIAGNOSIS — C3490 Malignant neoplasm of unspecified part of unspecified bronchus or lung: Secondary | ICD-10-CM | POA: Diagnosis not present

## 2023-03-17 LAB — RAD ONC ARIA SESSION SUMMARY
Course Elapsed Days: 29
Plan Fractions Treated to Date: 17
Plan Prescribed Dose Per Fraction: 2 Gy
Plan Total Fractions Prescribed: 33
Plan Total Prescribed Dose: 66 Gy
Reference Point Dosage Given to Date: 34 Gy
Reference Point Session Dosage Given: 2 Gy
Session Number: 17

## 2023-03-17 MED FILL — Dexamethasone Sodium Phosphate Inj 100 MG/10ML: INTRAMUSCULAR | Qty: 1 | Status: AC

## 2023-03-20 ENCOUNTER — Encounter: Payer: Self-pay | Admitting: Oncology

## 2023-03-20 ENCOUNTER — Inpatient Hospital Stay: Payer: Medicaid Other

## 2023-03-20 ENCOUNTER — Inpatient Hospital Stay (HOSPITAL_BASED_OUTPATIENT_CLINIC_OR_DEPARTMENT_OTHER): Payer: Medicaid Other | Admitting: Oncology

## 2023-03-20 ENCOUNTER — Other Ambulatory Visit: Payer: Self-pay

## 2023-03-20 ENCOUNTER — Ambulatory Visit
Admission: RE | Admit: 2023-03-20 | Discharge: 2023-03-20 | Disposition: A | Discharge status: Home / Self Care | Payer: Medicaid Other | Source: Ambulatory Visit | Attending: Radiation Oncology | Admitting: Radiation Oncology

## 2023-03-20 DIAGNOSIS — C3490 Malignant neoplasm of unspecified part of unspecified bronchus or lung: Secondary | ICD-10-CM

## 2023-03-20 LAB — CBC WITH DIFFERENTIAL/PLATELET
Abs Immature Granulocytes: 0.07 10*3/uL (ref 0.00–0.07)
Basophils Absolute: 0.1 10*3/uL (ref 0.0–0.1)
Basophils Relative: 1 %
Eosinophils Absolute: 0.4 10*3/uL (ref 0.0–0.5)
Eosinophils Relative: 6 %
HCT: 30.4 % — ABNORMAL LOW (ref 39.0–52.0)
Hemoglobin: 10.2 g/dL — ABNORMAL LOW (ref 13.0–17.0)
Immature Granulocytes: 1 %
Lymphocytes Relative: 13 %
Lymphs Abs: 0.9 10*3/uL (ref 0.7–4.0)
MCH: 27.9 pg (ref 26.0–34.0)
MCHC: 33.6 g/dL (ref 30.0–36.0)
MCV: 83.1 fL (ref 80.0–100.0)
Monocytes Absolute: 0.6 10*3/uL (ref 0.1–1.0)
Monocytes Relative: 8 %
Neutro Abs: 4.7 10*3/uL (ref 1.7–7.7)
Neutrophils Relative %: 71 %
Platelets: 214 10*3/uL (ref 150–400)
RBC: 3.66 MIL/uL — ABNORMAL LOW (ref 4.22–5.81)
RDW: 17.9 % — ABNORMAL HIGH (ref 11.5–15.5)
WBC: 6.7 10*3/uL (ref 4.0–10.5)
nRBC: 0 % (ref 0.0–0.2)

## 2023-03-20 LAB — RAD ONC ARIA SESSION SUMMARY
Course Elapsed Days: 32
Plan Fractions Treated to Date: 18
Plan Prescribed Dose Per Fraction: 2 Gy
Plan Total Fractions Prescribed: 33
Plan Total Prescribed Dose: 66 Gy
Reference Point Dosage Given to Date: 36 Gy
Reference Point Session Dosage Given: 2 Gy
Session Number: 18

## 2023-03-20 LAB — COMPREHENSIVE METABOLIC PANEL
ALT: 23 U/L (ref 0–44)
AST: 40 U/L (ref 15–41)
Albumin: 4.1 g/dL (ref 3.5–5.0)
Alkaline Phosphatase: 59 U/L (ref 38–126)
Anion gap: 8 (ref 5–15)
BUN: 11 mg/dL (ref 6–20)
CO2: 25 mmol/L (ref 22–32)
Calcium: 8.6 mg/dL — ABNORMAL LOW (ref 8.9–10.3)
Chloride: 94 mmol/L — ABNORMAL LOW (ref 98–111)
Creatinine, Ser: 0.84 mg/dL (ref 0.61–1.24)
GFR, Estimated: >60 mL/min (ref >60)
Glucose, Bld: 91 mg/dL (ref 70–99)
Potassium: 4.1 mmol/L (ref 3.5–5.1)
Sodium: 127 mmol/L — ABNORMAL LOW (ref 135–145)
Total Bilirubin: 0.4 mg/dL (ref 0.3–1.2)
Total Protein: 7.7 g/dL (ref 6.5–8.1)

## 2023-03-20 NOTE — Progress Notes (Signed)
Patient states that he is not going to do chemo anymore.

## 2023-03-20 NOTE — Progress Notes (Signed)
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  He had a chest x-ray followed by a CT scan which showed a 5.7 x 4.4 cm left upper lobe mass along with left hilar adenopathy.  There was a low-density 3.6 lesion noted in the right hepatic lobe which ultimately turned out to be a hemangioma on MRI liver.  PET CT scan showed hypermetabolic activity in the left upper lobe and left hilar nodal tissue.  Subcarinal and right paratracheal lymph nodes with mild hypermetabolic features.   Patient underwent bronchoscopies and biopsies.Left upper lobe biopsy was consistent with non-small cell carcinoma favor squamous cell carcinoma right subcarinal lymph node biopsy was negative for malignancy.   Plan is for concurrent chemoradiation with carbotaxol followed by maintenance durvalumab.  Patient does not want to get a port placed.  He is also refused Covid vaccination   NGS testing showed high tumor mutational burden.  PD-L1 90%.  MSI stable.  Patient PIK3CA and notch2 FCHO2 fusion, T p53   Scans in May 2022 Showed 2 hypermetabolic nodules in the left upper lobe and new hypermetabolic cavitary nodule in the right upper lobe concerning for disease progression.  No evidence of distant metastatic disease.  Patient was seen by Dr. Donella Stade and received SBRT to  the left lung with plans for continued monitoring of the right upper lobe lung nodule.  Patient completed maintenance durvalumab in March 2023     Repeat imaging has shown evolving changes in the left upper lobe with findings concerning for infection versus malignancy.  Patient therefore had a bronchoscopy which showed recurrent keratinizing squamous cell carcinoma.  PET CT scan did not show any evidence of distant metastatic disease.  Hypermetabolic necrotic masslike consolidation in the posterior upper lobe with hypermetabolic subcarinal adenopathy in November 2023.   Plan was to treat him with concurrent chemoradiation with weekly CarboTaxol for the second time. 1  Interval history-patient states that after he received the last dose of chemotherapy he had chest wall pain to the point that he could not breathe and the feeling did not go away for a couple of hours.  Overall he feels that he does not want to go through any more chemotherapy and will only complete his radiation treatment.  ECOG PS- 2 Pain scale- 0   Review of systems- Review of Systems  Constitutional:  Positive for malaise/fatigue. Negative for chills, fever and weight loss.  HENT:  Negative for congestion, ear discharge and nosebleeds.   Eyes:  Negative for blurred vision.  Respiratory:  Negative for cough, hemoptysis, sputum production, shortness of breath and wheezing.   Cardiovascular:  Negative for chest pain, palpitations, orthopnea and claudication.  Gastrointestinal:  Negative for abdominal pain, blood in stool, constipation, diarrhea, heartburn, melena, nausea and vomiting.  Genitourinary:  Negative for dysuria, flank pain, frequency, hematuria and urgency.  Musculoskeletal:  Negative for back pain, joint pain and myalgias.  Skin:  Negative for rash.  Neurological:  Negative for dizziness, tingling, focal weakness, seizures, weakness and headaches.  Endo/Heme/Allergies:  Does not bruise/bleed easily.   Psychiatric/Behavioral:  Negative for depression and suicidal ideas. The patient does not have insomnia.       No Known Allergies   Past Medical History:  Diagnosis Date   COPD (chronic obstructive pulmonary disease) ([place])    Emphysema of lung ([place])    Lung cancer ([place]) 06/2020   Pneumonia      Past Surgical History:  Procedure Laterality Date   BACK SURGERY  2017   lumbar region herniated disc   CARDIAC VALVE REPLACEMENT     ELECTROMAGNETIC NAVIGATION BROCHOSCOPY Left 10/21/2022   Procedure: ELECTROMAGNETIC NAVIGATION BRONCHOSCOPY;  Surgeon: Tyler Pita, MD;  Location: [facility] ORS;  Service: Cardiopulmonary;  Laterality: Left;   VIDEO BRONCHOSCOPY WITH ENDOBRONCHIAL NAVIGATION N/A 08/07/2020   Procedure: VIDEO BRONCHOSCOPY WITH ENDOBRONCHIAL NAVIGATION;  Surgeon: Tyler Pita, MD;  Location: [facility] ORS;  Service: Pulmonary;  Laterality: N/A;   VIDEO BRONCHOSCOPY WITH ENDOBRONCHIAL ULTRASOUND N/A 08/07/2020   Procedure: VIDEO BRONCHOSCOPY WITH ENDOBRONCHIAL ULTRASOUND;  Surgeon: Tyler Pita, MD;  Location: [facility] ORS;  Service: Pulmonary;  Laterality: N/A;    Social History   Socioeconomic History   Marital status: Divorced    Spouse name: Not on file   Number of children: Not on file   Years of education: Not on file   Highest education level: Not on file  Occupational History   Not on file  Tobacco Use   Smoking status: Every Day    Packs/day: 4.00    Years: 35.00    Additional pack years: 0.00    Total pack years: 140.00    Types: Cigarettes   Smokeless tobacco: Former    Types: Chew   Tobacco comments:    2PPD 08/24/2022    2 PPD 10/04/2022- Fabian Sharp, CMa  Vaping Use   Vaping Use: Never used  Substance and Sexual Activity   Alcohol use: Not Currently    Comment: 1 beer yest and not before 1 month ago   Drug use: No   Sexual activity: Not Currently  Other Topics Concern   Not on file  Social History Narrative   Not on file    Social Determinants of Health   Financial Resource Strain: Not on file  Food Insecurity: Not on file  Transportation Needs: Not on file  Physical Activity: Not on file  Stress: Not on file  Social Connections: Not on file  Intimate Partner Violence: Not on file    Family History  Problem Relation Age of Onset   Diabetes Father    Lung cancer Father 5   Heart attack Father    Throat cancer Maternal Aunt      Current Outpatient Medications:    acetaminophen (TYLENOL) 500 MG tablet, Take 500-1,000 mg by mouth every 6 (six) hours as needed (for pain.)., Disp: , Rfl:    albuterol (PROVENTIL) (2.5 MG/3ML) 0.083% nebulizer solution, Take 3 mLs (2.5 mg total) by nebulization every 6 (six) hours as needed for wheezing or shortness of breath., Disp: 75 mL, Rfl: 2   albuterol (VENTOLIN HFA) 108 (90 Base) MCG/ACT inhaler, TAKE 2 PUFFS BY MOUTH EVERY 6 HOURS AS NEEDED FOR WHEEZE OR SHORTNESS OF BREATH, Disp: 18 each, Rfl: 2   Budeson-Glycopyrrol-Formoterol (BREZTRI AEROSPHERE) 160-9-4.8 MCG/ACT AERO, Inhale 2 puffs into the lungs in  the morning and at bedtime., Disp: 10.7 g, Rfl: 5   dexamethasone (DECADRON) 4 MG tablet, Take 2 tablets daily for 2 days, start the day after chemotherapy. Take with food., Disp: 30 tablet, Rfl: 1   levothyroxine (SYNTHROID) 137 MCG tablet, Take 1 tablet (137 mcg total) by mouth daily before breakfast., Disp: 90 tablet, Rfl: 1   lidocaine-prilocaine (EMLA) cream, Apply to affected area once, Disp: 30 g, Rfl: 3   morphine (MS CONTIN) 30 MG 12 hr tablet, Take 1 tablet (30 mg total) by mouth every 12 (twelve) hours., Disp: 60 tablet, Rfl: 0   ondansetron (ZOFRAN) 8 MG tablet, Take 1 tablet (8 mg total) by mouth every 8 (eight) hours as needed for nausea or vomiting. Start on the third day after chemotherapy., Disp: 30 tablet, Rfl: 1   Oxycodone HCl 10 MG TABS, Take 1 tablet (10 mg total) by mouth every 4 (four) hours as needed., Disp: 180 tablet, Rfl: 0    pantoprazole (PROTONIX) 20 MG tablet, Take 1 tablet (20 mg total) by mouth daily. Take the pill in am 1 hour prior to eating food each day, Disp: 30 tablet, Rfl: 3   prochlorperazine (COMPAZINE) 10 MG tablet, Take 1 tablet (10 mg total) by mouth every 6 (six) hours as needed for nausea or vomiting., Disp: 30 tablet, Rfl: 1   sucralfate (CARAFATE) 1 g tablet, Take 1 tablet (1 g total) by mouth 3 (three) times daily before meals. Dissolve in warm water swish and swallow., Disp: 90 tablet, Rfl: 2  Physical exam:  Vitals:   03/20/23 0904  BP: 118/74  Pulse: 84  Resp: 18  Temp: (!) 96.5 F (35.8 C)  TempSrc: Tympanic  SpO2: 98%  Weight: 193 lb 6.4 oz (87.7 kg)  Height: 6\' 2"  (1.88 m)   Physical Exam Cardiovascular:     Rate and Rhythm: Normal rate and regular rhythm.     Heart sounds: Normal heart sounds.  Pulmonary:     Effort: Pulmonary effort is normal.     Breath sounds: Normal breath sounds.  Skin:    General: Skin is warm and dry.  Neurological:     Mental Status: He is alert and oriented to person, place, and time.         Latest Ref Rng & Units 03/20/2023    8:53 AM  CMP  Glucose 70 - 99 mg/dL 91   BUN 6 - 20 mg/dL 11   Creatinine 0.61 - 1.24 mg/dL 0.84   Sodium 135 - 145 mmol/L 127   Potassium 3.5 - 5.1 mmol/L 4.1   Chloride 98 - 111 mmol/L 94   CO2 22 - 32 mmol/L 25   Calcium 8.9 - 10.3 mg/dL 8.6   Total Protein 6.5 - 8.1 g/dL 7.7   Total Bilirubin 0.3 - 1.2 mg/dL 0.4   Alkaline Phos 38 - 126 U/L 59   AST 15 - 41 U/L 40   ALT 0 - 44 U/L 23       Latest Ref Rng & Units 03/20/2023    8:53 AM  CBC  WBC 4.0 - 10.5 K/uL 6.7   Hemoglobin 13.0 - 17.0 g/dL 10.2   Hematocrit 39.0 - 52.0 % 30.4   Platelets 150 - 400 K/uL 214      Assessment and plan- Patient is a 57 y.o. male with stage III squamous cell carcinoma involving the left upper lobe s/p concurrent chemoradiation followed by maintenance durvalumab.  He has biopsy-proven recurrence in the left  lobe.  He  is here for further management of lung cancer  Patient has received 3 weekly cycles of Taxol chemotherapy so far.  He states that he does not wish to go through any chemotherapy anymore.  He had significant chest wall pain after the last cycle of chemo and did not wish to undergo any further chemotherapy.  I am discontinuing his further chemo cycles at this time and he will complete palliative radiation alone to his left upper lobe.  I will repeat CT chest abdomen pelvis with contrast in 2 months and see him thereafter   Visit Diagnosis 1. Malignant neoplasm of unspecified part of unspecified bronchus or lung ([place])      Dr. Randa Evens, MD, MPH [facility] [phone] 03/20/2023 11:47 AM

## 2023-03-21 ENCOUNTER — Other Ambulatory Visit: Payer: Self-pay

## 2023-03-21 ENCOUNTER — Ambulatory Visit
Admission: RE | Admit: 2023-03-21 | Discharge: 2023-03-21 | Disposition: A | Discharge status: Home / Self Care | Payer: Medicaid Other | Source: Ambulatory Visit | Attending: Radiation Oncology | Admitting: Radiation Oncology

## 2023-03-21 DIAGNOSIS — C3490 Malignant neoplasm of unspecified part of unspecified bronchus or lung: Secondary | ICD-10-CM | POA: Diagnosis not present

## 2023-03-21 LAB — RAD ONC ARIA SESSION SUMMARY
Course Elapsed Days: 33
Plan Fractions Treated to Date: 19
Plan Prescribed Dose Per Fraction: 2 Gy
Plan Total Fractions Prescribed: 33
Plan Total Prescribed Dose: 66 Gy
Reference Point Dosage Given to Date: 38 Gy
Reference Point Session Dosage Given: 2 Gy
Session Number: 19

## 2023-03-22 ENCOUNTER — Ambulatory Visit
Admission: RE | Admit: 2023-03-22 | Discharge: 2023-03-22 | Disposition: A | Discharge status: Home / Self Care | Payer: Medicaid Other | Source: Ambulatory Visit | Attending: Radiation Oncology | Admitting: Radiation Oncology

## 2023-03-22 ENCOUNTER — Other Ambulatory Visit: Payer: Self-pay

## 2023-03-22 ENCOUNTER — Other Ambulatory Visit: Payer: Self-pay | Admitting: Oncology

## 2023-03-22 DIAGNOSIS — C3490 Malignant neoplasm of unspecified part of unspecified bronchus or lung: Secondary | ICD-10-CM | POA: Diagnosis not present

## 2023-03-22 LAB — RAD ONC ARIA SESSION SUMMARY
Course Elapsed Days: 34
Plan Fractions Treated to Date: 20
Plan Prescribed Dose Per Fraction: 2 Gy
Plan Total Fractions Prescribed: 33
Plan Total Prescribed Dose: 66 Gy
Reference Point Dosage Given to Date: 40 Gy
Reference Point Session Dosage Given: 2 Gy
Session Number: 20

## 2023-03-22 MED ORDER — OXYCODONE HCL 10 MG PO TABS: 10.0000 mg | ORAL_TABLET | ORAL | 0 refills | Status: DC | PRN

## 2023-03-23 ENCOUNTER — Other Ambulatory Visit: Payer: Self-pay

## 2023-03-23 ENCOUNTER — Ambulatory Visit
Admission: RE | Admit: 2023-03-23 | Discharge: 2023-03-23 | Disposition: A | Discharge status: Home / Self Care | Payer: Medicaid Other | Source: Ambulatory Visit | Attending: Radiation Oncology | Admitting: Radiation Oncology

## 2023-03-23 DIAGNOSIS — C3490 Malignant neoplasm of unspecified part of unspecified bronchus or lung: Secondary | ICD-10-CM | POA: Diagnosis not present

## 2023-03-23 LAB — RAD ONC ARIA SESSION SUMMARY
Course Elapsed Days: 35
Plan Fractions Treated to Date: 21
Plan Prescribed Dose Per Fraction: 2 Gy
Plan Total Fractions Prescribed: 33
Plan Total Prescribed Dose: 66 Gy
Reference Point Dosage Given to Date: 42 Gy
Reference Point Session Dosage Given: 2 Gy
Session Number: 21

## 2023-03-24 ENCOUNTER — Ambulatory Visit
Admission: RE | Admit: 2023-03-24 | Discharge: 2023-03-24 | Disposition: A | Discharge status: Home / Self Care | Payer: Medicaid Other | Source: Ambulatory Visit | Attending: Radiation Oncology | Admitting: Radiation Oncology

## 2023-03-24 ENCOUNTER — Other Ambulatory Visit: Payer: Self-pay

## 2023-03-24 DIAGNOSIS — C3490 Malignant neoplasm of unspecified part of unspecified bronchus or lung: Secondary | ICD-10-CM | POA: Diagnosis not present

## 2023-03-24 LAB — RAD ONC ARIA SESSION SUMMARY
Course Elapsed Days: 36
Plan Fractions Treated to Date: 22
Plan Prescribed Dose Per Fraction: 2 Gy
Plan Total Fractions Prescribed: 33
Plan Total Prescribed Dose: 66 Gy
Reference Point Dosage Given to Date: 44 Gy
Reference Point Session Dosage Given: 2 Gy
Session Number: 22

## 2023-03-26 ENCOUNTER — Ambulatory Visit: Payer: Medicaid Other

## 2023-03-27 ENCOUNTER — Ambulatory Visit
Admission: RE | Admit: 2023-03-27 | Discharge: 2023-03-27 | Disposition: A | Discharge status: Home / Self Care | Payer: Medicaid Other | Source: Ambulatory Visit | Attending: Radiation Oncology | Admitting: Radiation Oncology

## 2023-03-27 ENCOUNTER — Ambulatory Visit: Payer: Medicaid Other

## 2023-03-27 ENCOUNTER — Other Ambulatory Visit: Payer: Self-pay

## 2023-03-27 ENCOUNTER — Ambulatory Visit: Payer: Medicaid Other | Admitting: Oncology

## 2023-03-27 ENCOUNTER — Other Ambulatory Visit: Payer: Medicaid Other

## 2023-03-27 DIAGNOSIS — C3490 Malignant neoplasm of unspecified part of unspecified bronchus or lung: Secondary | ICD-10-CM | POA: Insufficient documentation

## 2023-03-27 LAB — RAD ONC ARIA SESSION SUMMARY
Course Elapsed Days: 39
Plan Fractions Treated to Date: 23
Plan Prescribed Dose Per Fraction: 2 Gy
Plan Total Fractions Prescribed: 33
Plan Total Prescribed Dose: 66 Gy
Reference Point Dosage Given to Date: 46 Gy
Reference Point Session Dosage Given: 2 Gy
Session Number: 23

## 2023-03-28 ENCOUNTER — Other Ambulatory Visit: Payer: Self-pay

## 2023-03-28 ENCOUNTER — Ambulatory Visit
Admission: RE | Admit: 2023-03-28 | Discharge: 2023-03-28 | Disposition: A | Discharge status: Home / Self Care | Payer: Medicaid Other | Source: Ambulatory Visit | Attending: Radiation Oncology | Admitting: Radiation Oncology

## 2023-03-28 DIAGNOSIS — C3490 Malignant neoplasm of unspecified part of unspecified bronchus or lung: Secondary | ICD-10-CM | POA: Diagnosis not present

## 2023-03-28 LAB — RAD ONC ARIA SESSION SUMMARY
Course Elapsed Days: 40
Plan Fractions Treated to Date: 24
Plan Prescribed Dose Per Fraction: 2 Gy
Plan Total Fractions Prescribed: 33
Plan Total Prescribed Dose: 66 Gy
Reference Point Dosage Given to Date: 48 Gy
Reference Point Session Dosage Given: 2 Gy
Session Number: 24

## 2023-03-29 ENCOUNTER — Other Ambulatory Visit: Payer: Self-pay

## 2023-03-29 ENCOUNTER — Ambulatory Visit
Admission: RE | Admit: 2023-03-29 | Discharge: 2023-03-29 | Disposition: A | Discharge status: Home / Self Care | Payer: Medicaid Other | Source: Ambulatory Visit | Attending: Radiation Oncology | Admitting: Radiation Oncology

## 2023-03-29 DIAGNOSIS — C3490 Malignant neoplasm of unspecified part of unspecified bronchus or lung: Secondary | ICD-10-CM | POA: Diagnosis not present

## 2023-03-29 LAB — RAD ONC ARIA SESSION SUMMARY
Course Elapsed Days: 41
Plan Fractions Treated to Date: 25
Plan Prescribed Dose Per Fraction: 2 Gy
Plan Total Fractions Prescribed: 33
Plan Total Prescribed Dose: 66 Gy
Reference Point Dosage Given to Date: 50 Gy
Reference Point Session Dosage Given: 2 Gy
Session Number: 25

## 2023-03-30 ENCOUNTER — Ambulatory Visit
Admission: RE | Admit: 2023-03-30 | Discharge: 2023-03-30 | Disposition: A | Discharge status: Home / Self Care | Payer: Medicaid Other | Source: Ambulatory Visit | Attending: Radiation Oncology | Admitting: Radiation Oncology

## 2023-03-30 ENCOUNTER — Other Ambulatory Visit: Payer: Self-pay

## 2023-03-30 ENCOUNTER — Telehealth: Payer: Self-pay | Admitting: Pulmonary Disease

## 2023-03-30 DIAGNOSIS — C3490 Malignant neoplasm of unspecified part of unspecified bronchus or lung: Secondary | ICD-10-CM | POA: Diagnosis not present

## 2023-03-30 LAB — RAD ONC ARIA SESSION SUMMARY
Course Elapsed Days: 42
Plan Fractions Treated to Date: 26
Plan Prescribed Dose Per Fraction: 2 Gy
Plan Total Fractions Prescribed: 33
Plan Total Prescribed Dose: 66 Gy
Reference Point Dosage Given to Date: 52 Gy
Reference Point Session Dosage Given: 2 Gy
Session Number: 26

## 2023-03-30 NOTE — Telephone Encounter (Signed)
I spoke with the patient. I told him he has refills on his inhalers we sent in on 02/23/2023. I also notified him that his insurance will not let him get 2 Albuterol inhalers at one time.  He will contact [facility] to get them filled.   Nothing further needed.

## 2023-03-30 NOTE — Telephone Encounter (Signed)
Pt is asking for refills on his inhalers to be sent to [facility] on [address]. He is also asking to get 2 ventolin rescue inhalers so he can keep 1 in his car

## 2023-03-31 ENCOUNTER — Other Ambulatory Visit: Payer: Self-pay

## 2023-03-31 ENCOUNTER — Ambulatory Visit
Admission: RE | Admit: 2023-03-31 | Discharge: 2023-03-31 | Disposition: A | Discharge status: Home / Self Care | Payer: Medicaid Other | Source: Ambulatory Visit | Attending: Radiation Oncology | Admitting: Radiation Oncology

## 2023-03-31 DIAGNOSIS — C3490 Malignant neoplasm of unspecified part of unspecified bronchus or lung: Secondary | ICD-10-CM | POA: Diagnosis not present

## 2023-03-31 LAB — RAD ONC ARIA SESSION SUMMARY
Course Elapsed Days: 43
Plan Fractions Treated to Date: 27
Plan Prescribed Dose Per Fraction: 2 Gy
Plan Total Fractions Prescribed: 33
Plan Total Prescribed Dose: 66 Gy
Reference Point Dosage Given to Date: 54 Gy
Reference Point Session Dosage Given: 2 Gy
Session Number: 27

## 2023-04-03 ENCOUNTER — Ambulatory Visit: Payer: Medicaid Other

## 2023-04-04 ENCOUNTER — Other Ambulatory Visit: Payer: Self-pay

## 2023-04-04 ENCOUNTER — Ambulatory Visit
Admission: RE | Admit: 2023-04-04 | Discharge: 2023-04-04 | Disposition: A | Discharge status: Home / Self Care | Payer: Medicaid Other | Source: Ambulatory Visit | Attending: Radiation Oncology | Admitting: Radiation Oncology

## 2023-04-04 ENCOUNTER — Ambulatory Visit: Payer: Medicaid Other

## 2023-04-04 DIAGNOSIS — C3490 Malignant neoplasm of unspecified part of unspecified bronchus or lung: Secondary | ICD-10-CM | POA: Diagnosis not present

## 2023-04-04 LAB — RAD ONC ARIA SESSION SUMMARY
Course Elapsed Days: 47
Plan Fractions Treated to Date: 28
Plan Prescribed Dose Per Fraction: 2 Gy
Plan Total Fractions Prescribed: 33
Plan Total Prescribed Dose: 66 Gy
Reference Point Dosage Given to Date: 56 Gy
Reference Point Session Dosage Given: 2 Gy
Session Number: 28

## 2023-04-05 ENCOUNTER — Ambulatory Visit: Payer: Medicaid Other

## 2023-04-05 ENCOUNTER — Other Ambulatory Visit: Payer: Self-pay

## 2023-04-05 ENCOUNTER — Ambulatory Visit
Admission: RE | Admit: 2023-04-05 | Discharge: 2023-04-05 | Disposition: A | Discharge status: Home / Self Care | Payer: Medicaid Other | Source: Ambulatory Visit | Attending: Radiation Oncology | Admitting: Radiation Oncology

## 2023-04-05 DIAGNOSIS — C3490 Malignant neoplasm of unspecified part of unspecified bronchus or lung: Secondary | ICD-10-CM | POA: Diagnosis not present

## 2023-04-05 LAB — RAD ONC ARIA SESSION SUMMARY
Course Elapsed Days: 48
Plan Fractions Treated to Date: 29
Plan Prescribed Dose Per Fraction: 2 Gy
Plan Total Fractions Prescribed: 33
Plan Total Prescribed Dose: 66 Gy
Reference Point Dosage Given to Date: 58 Gy
Reference Point Session Dosage Given: 2 Gy
Session Number: 29

## 2023-04-06 ENCOUNTER — Other Ambulatory Visit: Payer: Self-pay

## 2023-04-06 ENCOUNTER — Ambulatory Visit: Payer: Medicaid Other

## 2023-04-06 ENCOUNTER — Ambulatory Visit
Admission: RE | Admit: 2023-04-06 | Discharge: 2023-04-06 | Disposition: A | Discharge status: Home / Self Care | Payer: Medicaid Other | Source: Ambulatory Visit | Attending: Radiation Oncology | Admitting: Radiation Oncology

## 2023-04-06 DIAGNOSIS — C3490 Malignant neoplasm of unspecified part of unspecified bronchus or lung: Secondary | ICD-10-CM | POA: Diagnosis not present

## 2023-04-06 LAB — RAD ONC ARIA SESSION SUMMARY
Course Elapsed Days: 49
Plan Fractions Treated to Date: 30
Plan Prescribed Dose Per Fraction: 2 Gy
Plan Total Fractions Prescribed: 33
Plan Total Prescribed Dose: 66 Gy
Reference Point Dosage Given to Date: 60 Gy
Reference Point Session Dosage Given: 2 Gy
Session Number: 30

## 2023-04-07 ENCOUNTER — Ambulatory Visit
Admission: RE | Admit: 2023-04-07 | Discharge: 2023-04-07 | Disposition: A | Discharge status: Home / Self Care | Payer: Medicaid Other | Source: Ambulatory Visit | Attending: Radiation Oncology | Admitting: Radiation Oncology

## 2023-04-07 ENCOUNTER — Other Ambulatory Visit: Payer: Self-pay

## 2023-04-07 DIAGNOSIS — C3490 Malignant neoplasm of unspecified part of unspecified bronchus or lung: Secondary | ICD-10-CM | POA: Diagnosis not present

## 2023-04-07 LAB — RAD ONC ARIA SESSION SUMMARY
Course Elapsed Days: 50
Plan Fractions Treated to Date: 31
Plan Prescribed Dose Per Fraction: 2 Gy
Plan Total Fractions Prescribed: 33
Plan Total Prescribed Dose: 66 Gy
Reference Point Dosage Given to Date: 62 Gy
Reference Point Session Dosage Given: 2 Gy
Session Number: 31

## 2023-04-10 ENCOUNTER — Other Ambulatory Visit: Payer: Self-pay

## 2023-04-10 ENCOUNTER — Ambulatory Visit
Admission: RE | Admit: 2023-04-10 | Discharge: 2023-04-10 | Disposition: A | Discharge status: Home / Self Care | Payer: Medicaid Other | Source: Ambulatory Visit | Attending: Radiation Oncology | Admitting: Radiation Oncology

## 2023-04-10 ENCOUNTER — Ambulatory Visit: Payer: Medicaid Other

## 2023-04-10 DIAGNOSIS — C3490 Malignant neoplasm of unspecified part of unspecified bronchus or lung: Secondary | ICD-10-CM | POA: Diagnosis not present

## 2023-04-10 LAB — RAD ONC ARIA SESSION SUMMARY
Course Elapsed Days: 53
Plan Fractions Treated to Date: 32
Plan Prescribed Dose Per Fraction: 2 Gy
Plan Total Fractions Prescribed: 33
Plan Total Prescribed Dose: 66 Gy
Reference Point Dosage Given to Date: 64 Gy
Reference Point Session Dosage Given: 2 Gy
Session Number: 32

## 2023-04-11 ENCOUNTER — Ambulatory Visit
Admission: RE | Admit: 2023-04-11 | Discharge: 2023-04-11 | Disposition: A | Discharge status: Home / Self Care | Payer: Medicaid Other | Source: Ambulatory Visit | Attending: Radiation Oncology | Admitting: Radiation Oncology

## 2023-04-11 ENCOUNTER — Other Ambulatory Visit: Payer: Self-pay

## 2023-04-11 DIAGNOSIS — C3490 Malignant neoplasm of unspecified part of unspecified bronchus or lung: Secondary | ICD-10-CM | POA: Diagnosis not present

## 2023-04-11 LAB — RAD ONC ARIA SESSION SUMMARY
Course Elapsed Days: 54
Plan Fractions Treated to Date: 33
Plan Prescribed Dose Per Fraction: 2 Gy
Plan Total Fractions Prescribed: 33
Plan Total Prescribed Dose: 66 Gy
Reference Point Dosage Given to Date: 66 Gy
Reference Point Session Dosage Given: 2 Gy
Session Number: 33

## 2023-04-26 ENCOUNTER — Other Ambulatory Visit: Payer: Self-pay | Admitting: Oncology

## 2023-04-26 MED ORDER — OXYCODONE HCL 10 MG PO TABS: 10.0000 mg | ORAL_TABLET | ORAL | 0 refills | Status: DC | PRN

## 2023-05-25 ENCOUNTER — Other Ambulatory Visit: Payer: Self-pay | Admitting: Oncology

## 2023-05-25 ENCOUNTER — Ambulatory Visit
Admission: RE | Admit: 2023-05-25 | Discharge: 2023-05-25 | Disposition: A | Discharge status: Home / Self Care | Payer: Medicaid Other | Source: Ambulatory Visit | Attending: Oncology | Admitting: Oncology

## 2023-05-25 DIAGNOSIS — C3490 Malignant neoplasm of unspecified part of unspecified bronchus or lung: Secondary | ICD-10-CM | POA: Diagnosis present

## 2023-05-25 LAB — POCT I-STAT CREATININE: Creatinine, Ser: 1 mg/dL (ref 0.61–1.24)

## 2023-05-25 MED ORDER — IOHEXOL 300 MG/ML  SOLN
100.0000 mL | Freq: Once | INTRAMUSCULAR | Status: AC | PRN
  Administered 2023-05-25: 100 mL via INTRAVENOUS

## 2023-05-25 MED ORDER — OXYCODONE HCL 10 MG PO TABS: 10.0000 mg | ORAL_TABLET | ORAL | 0 refills | Status: DC | PRN

## 2023-05-26 ENCOUNTER — Ambulatory Visit: Payer: Medicaid Other | Admitting: Oncology

## 2023-05-31 ENCOUNTER — Inpatient Hospital Stay: Payer: Medicaid Other | Admitting: Oncology

## 2023-06-01 ENCOUNTER — Ambulatory Visit: Payer: Medicaid Other | Admitting: Radiation Oncology

## 2023-06-06 ENCOUNTER — Encounter: Payer: Self-pay | Admitting: Oncology

## 2023-06-06 ENCOUNTER — Inpatient Hospital Stay: Payer: Medicaid Other | Attending: Oncology | Admitting: Oncology

## 2023-06-06 VITALS — BP 98/69 | HR 85 | Temp 96.6°F | Wt 182.5 lb

## 2023-06-06 DIAGNOSIS — Z833 Family history of diabetes mellitus: Secondary | ICD-10-CM | POA: Insufficient documentation

## 2023-06-06 DIAGNOSIS — Z79899 Other long term (current) drug therapy: Secondary | ICD-10-CM | POA: Diagnosis not present

## 2023-06-06 DIAGNOSIS — R5383 Other fatigue: Secondary | ICD-10-CM | POA: Insufficient documentation

## 2023-06-06 DIAGNOSIS — Z8249 Family history of ischemic heart disease and other diseases of the circulatory system: Secondary | ICD-10-CM | POA: Diagnosis not present

## 2023-06-06 DIAGNOSIS — J441 Chronic obstructive pulmonary disease with (acute) exacerbation: Secondary | ICD-10-CM | POA: Insufficient documentation

## 2023-06-06 DIAGNOSIS — Z808 Family history of malignant neoplasm of other organs or systems: Secondary | ICD-10-CM | POA: Insufficient documentation

## 2023-06-06 DIAGNOSIS — C3490 Malignant neoplasm of unspecified part of unspecified bronchus or lung: Secondary | ICD-10-CM | POA: Diagnosis not present

## 2023-06-06 DIAGNOSIS — C3412 Malignant neoplasm of upper lobe, left bronchus or lung: Secondary | ICD-10-CM | POA: Diagnosis present

## 2023-06-06 DIAGNOSIS — I3139 Other pericardial effusion (noninflammatory): Secondary | ICD-10-CM | POA: Insufficient documentation

## 2023-06-06 DIAGNOSIS — Z801 Family history of malignant neoplasm of trachea, bronchus and lung: Secondary | ICD-10-CM | POA: Insufficient documentation

## 2023-06-06 DIAGNOSIS — F17210 Nicotine dependence, cigarettes, uncomplicated: Secondary | ICD-10-CM | POA: Insufficient documentation

## 2023-06-06 DIAGNOSIS — R0602 Shortness of breath: Secondary | ICD-10-CM | POA: Insufficient documentation

## 2023-06-06 NOTE — Progress Notes (Signed)
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  He had a chest x-ray followed by a CT scan which showed a 5.7 x 4.4 cm left upper lobe mass along with left hilar adenopathy.  There was a low-density 3.6 lesion noted in the right hepatic lobe which ultimately turned out to be a hemangioma on MRI liver.  PET CT scan showed hypermetabolic activity in the left upper lobe and left hilar nodal tissue.  Subcarinal and right paratracheal lymph nodes with mild hypermetabolic features.   Patient underwent bronchoscopies and biopsies.Left upper lobe biopsy was consistent with non-small cell carcinoma favor squamous cell carcinoma right subcarinal lymph node biopsy was negative for malignancy.   Plan is for concurrent chemoradiation with carbotaxol followed by maintenance durvalumab.  Patient does not want to get a port placed.  He is also refused Covid vaccination   NGS testing showed high tumor mutational burden.  PD-L1 90%.  MSI stable.  Patient PIK3CA and notch2 FCHO2 fusion, T p53   Scans in May 2022 Showed 2 hypermetabolic nodules in the left upper lobe and new hypermetabolic cavitary nodule in the right upper lobe concerning for disease progression.  No evidence of distant metastatic disease.  Patient was seen by Dr. Aggie Cosier and received SBRT to the left lung with plans for continued monitoring of  the right upper lobe lung nodule.  Patient completed maintenance durvalumab in March 2023     Repeat imaging has shown evolving changes in the left upper lobe with findings concerning for infection versus malignancy.  Patient therefore had a bronchoscopy which showed recurrent keratinizing squamous cell carcinoma.  PET CT scan did not show any evidence of distant metastatic disease.  Hypermetabolic necrotic masslike consolidation in the posterior upper lobe with hypermetabolic subcarinal adenopathy in November 2023.   Plan was to treat him with concurrent chemoradiation with weekly CarboTaxol for the second time.  He could not tolerate chemotherapy and therefore proceeded with radiation treatment alone  Interval history-patient feels overall stable at his baseline.  He has chronic fatigue and exertional shortness of breath.  He is not on any home oxygen.  ECOG PS- 2 Pain scale- 4 Opioid associated constipation- no  Review of systems- Review of Systems  Constitutional:  Positive for malaise/fatigue. Negative for chills, fever and weight loss.  HENT:  Negative for congestion, ear discharge and nosebleeds.   Eyes:  Negative for blurred vision.  Respiratory:  Positive for shortness of breath. Negative for cough, hemoptysis, sputum production and wheezing.   Cardiovascular:  Negative for chest pain, palpitations, orthopnea and claudication.  Gastrointestinal:  Negative for abdominal pain, blood in stool, constipation, diarrhea, heartburn, melena, nausea and vomiting.  Genitourinary:  Negative for dysuria, flank pain, frequency, hematuria and urgency.  Musculoskeletal:  Negative for back pain, joint pain and myalgias.  Skin:  Negative for rash.  Neurological:  Negative for dizziness, tingling, focal weakness, seizures, weakness and headaches.  Endo/Heme/Allergies:  Does not bruise/bleed easily.  Psychiatric/Behavioral:  Negative for depression and suicidal ideas. The patient does not have insomnia.        No Known Allergies   Past Medical History:  Diagnosis Date   COPD (chronic obstructive pulmonary disease) (HCC)    Emphysema of lung (HCC)    Lung cancer (HCC) 06/2020   Pneumonia      Past Surgical History:  Procedure Laterality Date   BACK SURGERY  2017   lumbar region herniated disc   CARDIAC VALVE REPLACEMENT     ELECTROMAGNETIC NAVIGATION BROCHOSCOPY Left 10/21/2022   Procedure: ELECTROMAGNETIC NAVIGATION BRONCHOSCOPY;  Surgeon: Salena Saner, MD;  Location: [facility];  Service: Cardiopulmonary;  Laterality: Left;   VIDEO BRONCHOSCOPY WITH ENDOBRONCHIAL NAVIGATION N/A 08/07/2020   Procedure: VIDEO BRONCHOSCOPY WITH ENDOBRONCHIAL NAVIGATION;  Surgeon: Salena Saner, MD;  Location: [facility];  Service: Pulmonary;  Laterality: N/A;   VIDEO BRONCHOSCOPY WITH ENDOBRONCHIAL ULTRASOUND N/A 08/07/2020   Procedure: VIDEO BRONCHOSCOPY WITH ENDOBRONCHIAL ULTRASOUND;  Surgeon: Salena Saner, MD;  Location: [facility];  Service: Pulmonary;  Laterality: N/A;    Social History   Socioeconomic History   Marital status: Divorced    Spouse name: Not on file   Number of children: Not on file   Years of education: Not on file   Highest education level: Not on file  Occupational History   Not on file  Tobacco Use   Smoking status: Every Day    Packs/day: 4.00    Years: 35.00    Additional pack years: 0.00    Total pack years: 140.00    Types: Cigarettes   Smokeless tobacco: Former    Types: Chew   Tobacco comments:    2PPD 08/24/2022    2 PPD 10/04/2022- [place]k [place], CMa  Vaping Use   Vaping Use: Never used  Substance and Sexual Activity   Alcohol use: Not Currently    Comment: 1 beer yest and not before 1 month ago   Drug use: No   Sexual activity: Not Currently  Other Topics Concern   Not on file  Social History Narrative   Not on file   Social Determinants of Health   Financial Resource Strain: Not on file  Food Insecurity: Not on file   Transportation Needs: Not on file  Physical Activity: Not on file  Stress: Not on file  Social Connections: Not on file  Intimate Partner Violence: Not on file    Family History  Problem Relation Age of Onset   Diabetes Father    Lung cancer Father 52   Heart attack Father    Throat cancer Maternal Aunt      Current Outpatient Medications:    acetaminophen (TYLENOL) 500 MG tablet, Take 500-1,000 mg by mouth every 6 (six) hours as needed (for pain.)., Disp: , Rfl:    albuterol (PROVENTIL) (2.5 MG/3ML) 0.083% nebulizer solution, Take 3 mLs (2.5 mg total) by nebulization every 6 (six) hours as needed for wheezing or shortness of breath., Disp: 75 mL, Rfl: 2   albuterol (VENTOLIN HFA) 108 (90 Base) MCG/ACT inhaler, TAKE 2 PUFFS BY MOUTH EVERY 6 HOURS AS NEEDED FOR WHEEZE OR SHORTNESS OF BREATH, Disp: 18 each, Rfl: 2   Budeson-Glycopyrrol-Formoterol (BREZTRI AEROSPHERE) 160-9-4.8 MCG/ACT AERO, Inhale 2 puffs into the lungs in the morning and at bedtime., Disp: 10.7 g, Rfl: 5   levothyroxine (SYNTHROID) 137 MCG tablet, Take 1 tablet (137 mcg total) by mouth daily before breakfast., Disp: 90 tablet, Rfl:  1   morphine (MS CONTIN) 30 MG 12 hr tablet, Take 1 tablet (30 mg total) by mouth every 12 (twelve) hours., Disp: 60 tablet, Rfl: 0   Oxycodone HCl 10 MG TABS, Take 1 tablet (10 mg total) by mouth every 4 (four) hours as needed., Disp: 180 tablet, Rfl: 0   pantoprazole (PROTONIX) 20 MG tablet, Take 1 tablet (20 mg total) by mouth daily. Take the pill in am 1 hour prior to eating food each day, Disp: 30 tablet, Rfl: 3   prochlorperazine (COMPAZINE) 10 MG tablet, Take 1 tablet (10 mg total) by mouth every 6 (six) hours as needed for nausea or vomiting., Disp: 30 tablet, Rfl: 1   sucralfate (CARAFATE) 1 g tablet, Take 1 tablet (1 g total) by mouth 3 (three) times daily before meals. Dissolve in warm water swish and swallow., Disp: 90 tablet, Rfl: 2  Physical exam:  Vitals:   06/06/23 1330  BP:  98/69  Pulse: 85  Temp: (!) 96.6 F (35.9 C)  TempSrc: Tympanic  SpO2: 97%  Weight: 182 lb 8 oz (82.8 kg)   Physical Exam Constitutional:      Comments: Appears fatigued  Cardiovascular:     Rate and Rhythm: Normal rate and regular rhythm.     Heart sounds: Normal heart sounds.  Pulmonary:     Effort: Pulmonary effort is normal.     Comments: Breath sounds decreased b/l diffusely Abdominal:     General: Bowel sounds are normal.     Palpations: Abdomen is soft.  Skin:    General: Skin is warm and dry.  Neurological:     Mental Status: He is alert and oriented to person, place, and time.         Latest Ref Rng & Units 05/25/2023    4:12 PM  CMP  Creatinine 0.61 - 1.24 mg/dL 1.61       Latest Ref Rng & Units 03/20/2023    8:53 AM  CBC  WBC 4.0 - 10.5 K/uL 6.7   Hemoglobin 13.0 - 17.0 g/dL 09.6   Hematocrit 04.5 - 52.0 % 30.4   Platelets 150 - 400 K/uL 214       CT CHEST ABDOMEN PELVIS W CONTRAST  Result Date: 05/29/2023 CLINICAL DATA:  Squamous cell carcinoma left upper lobe with recurrent disease. * Tracking Code: BO * EXAM: CT CHEST, ABDOMEN, AND PELVIS WITH CONTRAST TECHNIQUE: Multidetector CT imaging of the chest, abdomen and pelvis was performed following the standard protocol during bolus administration of intravenous contrast. RADIATION DOSE REDUCTION: This exam was performed according to the departmental dose-optimization program which includes automated exposure control, adjustment of the mA and/or kV according to patient size and/or use of iterative reconstruction technique. CONTRAST:  OMNIPAQUE IOHEXOL 300 MG/ML  SOLN COMPARISON:  PET-CT scan 01/24/2023. Noncontrast CT 10/18/2022 of the chest. Chest abdomen pelvis CT 05/03/2022 FINDINGS: CT CHEST FINDINGS Cardiovascular: Moderate pericardial effusion, slightly increased from previous. Heart itself is nonenlarged. Coronary artery calcifications are seen. The thoracic aorta has a normal course and caliber with  minimal atherosclerotic disease. Mediastinum/Nodes: Atrophic thyroid gland. Slightly patulous thoracic esophagus. No discrete abnormal lymph node enlargement identified in the axillary region, right hilum or mediastinum. The previous hypermetabolic subcarinal node that measured 11 mm in short axis, today on series 2, image 25 measures 7 mm. Persistent soft tissue thickening in the left hilum. Lungs/Pleura: Diffuse centrilobular emphysematous lung changes are identified. There are areas of scarring and fibrotic changes. In the right upper lobe  inferiorly once again is a bandlike opacity with nodular contours and spiculations along the course of the bronchus. Area on axial series 4, image 63 today measures 3.2 by 1.7 cm, similar to previous when adjusting for technique. This area did not show significant abnormal uptake. Confluent masslike opacity left perihilar extending into the peripheral left upper lobe inferiorly and posteriorly is again seen with the adjacent loculated pleural effusion. Comparing CT scans the areas overall similar to the PET-CT. On the prior there was some abnormal uptake along the medial margin of this area. Dimensions of the masslike area with soft tissue thickening today measures a proximally 9.1 by 6.4 cm. On the prior PET-CT in retrospect when measured in the same fashion is today the area would measure proximally 8.6 by 6.8 cm, similar when adjusting for technique. Musculoskeletal: Curvature of the spine with some degenerative changes. CT ABDOMEN PELVIS FINDINGS Hepatobiliary: Fatty liver infiltration identified. Once again there are multiple hepatic hemangiomas, unchanged from previous. Patent portal vein. Gallbladder is nondilated. Pancreas: Unremarkable. No pancreatic ductal dilatation or surrounding inflammatory changes. Spleen: Normal in size without focal abnormality. Adrenals/Urinary Tract: Adrenal glands are unremarkable. Kidneys are normal, without renal calculi, focal lesion, or  hydronephrosis. Bladder is unremarkable. Stomach/Bowel: No oral contrast. There is moderate colonic stool. The large bowel has a normal course and caliber. Stomach and small bowel are nondilated. Normal appendix in the right hemipelvis with some high attenuation luminal debris. Vascular/Lymphatic: Aortic atherosclerosis. No enlarged abdominal or pelvic lymph nodes. Reproductive: Prostate is unremarkable. Other: No abdominal wall hernia or abnormality. No abdominopelvic ascites. Musculoskeletal: No acute or significant osseous findings. IMPRESSION: Cavitary left upper lobe mass with pleural effusion is again seen and appears similar in size compared to the recent examination when adjusting for technique. Stable nodular opacity along the inferior right upper lobe. Previous subcarinal lymph node on the PET-CT scan is slightly smaller today. No new lymph node enlargement identified. Increasing moderate pericardial effusion. Fatty liver infiltration.  Stable multiple hemangiomas. Electronically Signed   By: Karen Kays M.D.   On: 05/29/2023 15:39     Assessment and plan- Patient is a 57 y.o. male with history of locally recurrent stage III squamous cell carcinoma of the lung here to discuss CT scan results and further Management     I have reviewed CT chest abdomen and pelvis with contrast images independently and discussed findings with the patient which shows postradiation changes in the left upper lobe but overall the mass appears similar in size as compared to prior.  Subcarinal lymph node appears smaller.  There is no evidence of distant metastatic disease.  My plan is to continue surveillance scans every 3 months and see him thereafter.  Patient could not tolerate even weekly CarboTaxol chemotherapy and therefore only underwent palliative radiation for his recurrent lung cancer.  Patient was found to have pericardial effusion even on his CT scan back in November 2023.  We had repeated an echocardiogram to  follow-up on these findings which showed a small pericardial effusion.  Presently the pericardial effusion appears increased in size and I will get another echocardiogram and referral to cardiology.  So far he has not had any evidence of stage IV disease and it is unclear if the pericardial effusion is related to lung cancer or not  I would also like him to follow with pulmonary for his chronic lung disease.  I will discuss scans at tumor board in 3 weeks time after I get back from my vacation  Visit Diagnosis 1. Malignant neoplasm of unspecified part of unspecified bronchus or lung (HCC)   2. Pericardial effusion   3. COPD exacerbation (HCC)      Dr. Owens Shark, MD, MPH [facility] [phone] 06/06/2023 3:23 PM

## 2023-06-15 ENCOUNTER — Other Ambulatory Visit: Payer: Self-pay

## 2023-06-15 MED ORDER — ALBUTEROL SULFATE HFA 108 (90 BASE) MCG/ACT IN AERS: INHALATION_SPRAY | RESPIRATORY_TRACT | 2 refills | Status: DC

## 2023-06-16 ENCOUNTER — Ambulatory Visit: Admission: RE | Admit: 2023-06-16 | Payer: Medicaid Other | Source: Ambulatory Visit

## 2023-06-16 ENCOUNTER — Other Ambulatory Visit: Payer: Self-pay | Admitting: Oncology

## 2023-06-19 ENCOUNTER — Ambulatory Visit: Payer: Medicaid Other | Admitting: Radiation Oncology

## 2023-06-19 ENCOUNTER — Other Ambulatory Visit: Payer: Self-pay

## 2023-06-19 MED ORDER — ALBUTEROL SULFATE HFA 108 (90 BASE) MCG/ACT IN AERS: INHALATION_SPRAY | RESPIRATORY_TRACT | 2 refills | Status: DC

## 2023-06-20 ENCOUNTER — Encounter: Payer: Self-pay | Admitting: Pulmonary Disease

## 2023-06-20 ENCOUNTER — Ambulatory Visit (INDEPENDENT_AMBULATORY_CARE_PROVIDER_SITE_OTHER): Payer: Medicaid Other | Admitting: Pulmonary Disease

## 2023-06-20 ENCOUNTER — Other Ambulatory Visit: Payer: Self-pay | Admitting: Radiation Oncology

## 2023-06-20 VITALS — BP 118/70 | HR 85 | Temp 97.6°F | Ht 74.0 in | Wt 183.8 lb

## 2023-06-20 DIAGNOSIS — J449 Chronic obstructive pulmonary disease, unspecified: Secondary | ICD-10-CM | POA: Diagnosis not present

## 2023-06-20 DIAGNOSIS — I3139 Other pericardial effusion (noninflammatory): Secondary | ICD-10-CM

## 2023-06-20 DIAGNOSIS — C3492 Malignant neoplasm of unspecified part of left bronchus or lung: Secondary | ICD-10-CM | POA: Diagnosis not present

## 2023-06-20 MED ORDER — OXYCODONE HCL 10 MG PO TABS: 10.0000 mg | ORAL_TABLET | ORAL | 0 refills | Status: DC | PRN

## 2023-06-20 MED ORDER — IPRATROPIUM-ALBUTEROL 0.5-2.5 (3) MG/3ML IN SOLN: 3.0000 mL | Freq: Four times a day (QID) | RESPIRATORY_TRACT | 6 refills | Status: DC | PRN

## 2023-06-20 MED ORDER — IPRATROPIUM-ALBUTEROL 0.5-2.5 (3) MG/3ML IN SOLN
3.0000 mL | Freq: Once | RESPIRATORY_TRACT | Status: AC
Start: 2023-06-20 — End: 2023-06-20
  Administered 2023-06-20: 3 mL via RESPIRATORY_TRACT

## 2023-06-20 NOTE — Progress Notes (Unsigned)
Subjective:    Patient ID: Wesley Ferguson, male    DOB: 08-Jul-1966, 57 y.o.   MRN: [id number]  Patient Care Team: Pcp, No as PCP - General Glory Buff, RN as Oncology Nurse Navigator Creig Hines, MD as Consulting Physician (Hematology and Oncology)  Chief Complaint  Patient presents with   Follow-up    Increased SOB for 1 month. Wheezing. Cough with clear sputum.    HPI Patient is a 57 year old current smoker (2-2.5 PPD, 140 PY) with a history of stage IIIa non-small cell carcinoma of the left upper lobe diagnosed initially in August 2021.  Patient had concurrent chemoradiation with CarboTaxol followed by maintenance durvalumab.  NGS testing showed high tumor mutational burden.  In May 2022 surveillance CT scans and PET/CT showed hypermetabolic nodules in the left upper lobe and new hypermetabolic cavitary nodule in the right lower lobe concerning for disease progression.  No evidence of distant metastatic disease.  Patient received SBRT to the left lung with plans to continue monitoring on the right upper lobe lung nodule.  Patient completed maintenance durvalumab in March 2023.  In the interim, in May 2023 he developed issues with purulent sputum production and chest pain.  He was treated with for a necrotizing pneumonia.  Subsequently after being treated for that ,in June 2023, he had a restaging PET/CT that showed suggestion of recurrent left upper lobe superior segment and left lower lobe tumor activity.  There were also potential surrounding inflammatory or infectious changes.  There was a treated right upper lobe lesion without evidence of residual or recurrent tumor it was no mediastinal hilar metastatic lymphadenopathy.  After this PET/CT, bronchoscopy was offered to the patient during July and August however he wanted to wait till October due to plans to go on vacation.  He was last seen on 04 October 2022 with subsequent bronchoscopy with endobronchial ultrasound and TBNA on 21 October 2022.  Left subcarinal space EBUS guided needle aspiration was consistent with squamous cell carcinoma recurrence.  It was planned to patient with weekly CarboTaxol for the second time.  Received 3 weekly cycles of Taxol chemotherapy but after that decided that he did not want to go through any further chemotherapy.  He received palliative radiation to his left upper lobe.  Had a CT chest abdomen and pelvis on 25 May 2023 that shows a cavitary left upper lobe mass with pleural effusion similar in size when compared to prior.  Stable nodular opacity along the inferior right upper lobe previous subcarinal lymph node slightly smaller.  No new lymph node enlargement identified.  Increasing moderate pericardial effusion.  The liver alteration and stable hemangiomas.  Patient presents today noting that he has had increasing shortness of breath for 1 month.  He also notices wheezing and cough productive of clear to whitish sputum particularly in the mornings.  No hemoptysis.  No weight loss or anorexia.  Was complaining of fatigue.  Does not feel that albuterol inhaler helps him much is using it about 3-4 times per day.  Using albuterol solution 3-4 times per week as well.  He is not using Breztri as previously prescribed.  He should be taking this 2 puffs twice a day but it using it only on a as needed basis.  He has had no chest pain, no lower extremity edema or calf tenderness.  Been having issues with insomnia.  He is to smoke as noted above.  He has had no fevers, chills or sweats.  No  other symptoms.   Review of Systems A 10 point review of systems was performed and it is as noted above otherwise negative.   Patient Active Problem List   Diagnosis Date Noted   Multiple tracheobronchial mucus plugs    Mediastinal adenopathy    Malignant neoplasm of unspecified part of unspecified bronchus or lung (HCC) 08/14/2020   Goals of care, counseling/discussion 08/14/2020   Cavitating mass of upper lobe  of left lung 07/20/2020   Nicotine dependence 07/20/2020   Cavitating mass in left upper lung lobe 07/20/2020   Hyponatremia 07/20/2020   Postobstructive pneumonia 07/20/2020   Back pain 11/28/2013   Lumbar radiculopathy 11/28/2013    Social History   Tobacco Use   Smoking status: Every Day    Packs/day: 4.00    Years: 35.00    Additional pack years: 0.00    Total pack years: 140.00    Types: Cigarettes   Smokeless tobacco: Former    Types: Chew   Tobacco comments:    2-2.5 PPD khj 06/20/2023  Substance Use Topics   Alcohol use: Not Currently    Comment: 1 beer yest and not before 1 month ago    No Known Allergies  Current Meds  Medication Sig   acetaminophen (TYLENOL) 500 MG tablet Take 500-1,000 mg by mouth every 6 (six) hours as needed (for pain.).   albuterol (PROVENTIL) (2.5 MG/3ML) 0.083% nebulizer solution Take 3 mLs (2.5 mg total) by nebulization every 6 (six) hours as needed for wheezing or shortness of breath.   albuterol (VENTOLIN HFA) 108 (90 Base) MCG/ACT inhaler TAKE 2 PUFFS BY MOUTH EVERY 6 HOURS AS NEEDED FOR WHEEZE OR SHORTNESS OF BREATH   Budeson-Glycopyrrol-Formoterol (BREZTRI AEROSPHERE) 160-9-4.8 MCG/ACT AERO Inhale 2 puffs into the lungs in the morning and at bedtime.   levothyroxine (SYNTHROID) 137 MCG tablet Take 1 tablet (137 mcg total) by mouth daily before breakfast.   morphine (MS CONTIN) 30 MG 12 hr tablet Take 1 tablet (30 mg total) by mouth every 12 (twelve) hours.   Oxycodone HCl 10 MG TABS Take 1 tablet (10 mg total) by mouth every 4 (four) hours as needed.   pantoprazole (PROTONIX) 20 MG tablet Take 1 tablet (20 mg total) by mouth daily. Take the pill in am 1 hour prior to eating food each day   prochlorperazine (COMPAZINE) 10 MG tablet Take 1 tablet (10 mg total) by mouth every 6 (six) hours as needed for nausea or vomiting.   sucralfate (CARAFATE) 1 g tablet DISSOLVE 1 TABLET IN WARM WATER, SWISH AND SWALLOW THREE TIMES DAILY    There is  no immunization history on file for this patient.    Objective:     BP 118/70 (BP Location: Left Arm, Cuff Size: Normal)   Pulse 85   Temp 97.6 F (36.4 C)   Ht 6\' 2"  (1.88 m)   Wt 183 lb 12.8 oz (83.4 kg)   SpO2 98%   BMI 23.60 kg/m   SpO2: 98 % O2 Device: None (Room air)  GENERAL: Well-developed, well-nourished gentleman, no acute distress.  Appears chronically ill.  Sallow complexion. Fully ambulatory.  HEAD: Normocephalic, atraumatic.  EYES: Pupils equal, round, reactive to light.  No scleral icterus.   MOUTH: Very poor dentition, oral mucosa moist.  No thrush. NECK: Supple. No thyromegaly. Trachea midline. No JVD.  No adenopathy. PULMONARY: Good air entry bilaterally.  Diffuse end expiratory wheezes. CARDIOVASCULAR: S1 and S2. Regular rate and rhythm.  No rubs, murmurs or gallops heard.  GASTROINTESTINAL: Benign. MUSCULOSKELETAL: No joint deformity, no clubbing, no edema.  NEUROLOGIC: No focal deficit, fluent speech.  Appears to have mild memory impairment (chronic). No gait disturbance. SKIN: Intact,warm,dry.  Limited exam of skin: No rashes. PSYCH: Affect flat, behavior normal.  Patient received nebulization treatment with DuoNeb which cleared wheezing and relieve his dyspnea.  Assessment & Plan:     ICD-10-CM   1. Stage 3 severe COPD by GOLD classification (HCC)  J44.9 ipratropium-albuterol (DUONEB) 0.5-2.5 (3) MG/3ML nebulizer solution 3 mL   Poorly compensated Switch to maintain once DuoNeb 4 times a day May continue as needed albuterol    2. Squamous cell carcinoma lung, left (HCC)  C34.92    This issue adds complexity to his management Followed closely by oncology    3. Pericardial effusion  I31.39    Appears to be enlarging by CT Echocardiogram ordered by Dr. Smith Robert Query metastatic disease     Meds ordered this encounter  Medications   ipratropium-albuterol (DUONEB) 0.5-2.5 (3) MG/3ML nebulizer solution 3 mL   ipratropium-albuterol (DUONEB) 0.5-2.5 (3)  MG/3ML SOLN    Sig: Take 3 mLs by nebulization every 6 (six) hours as needed (Shortness of breath or wheezing).    Dispense:  360 mL    Refill:  6   The patient will discontinue use of Breztri as he is only using this haphazardly only.  He may continue use of albuterol as needed however have asked him to rely more on his nebulizer 4 times a day to see if this helps him curtail the use of albuterol.  Will see the patient in follow-up in 4 to 6 weeks time he is to contact us prior to that time should any new difficulties arise.   Gailen Shelter, MD Advanced Bronchoscopy PCCM [facility]    *This note was dictated using voice recognition software/Dragon.  Despite best efforts to proofread, errors can occur which can change the meaning. Any transcriptional errors that result from this process are unintentional and may not be fully corrected at the time of dictation.

## 2023-06-20 NOTE — Patient Instructions (Signed)
We have sent the medication for your nebulizer that is called DuoNeb (ipratropium/albuterol) you can use this medicine up to 4 times a day.  This is to be used for shortness of breath and wheezing.  The nebulizer should help you not to need the albuterol (rescue inhaler) as much.  Do not use the Breztri (yellow inhaler) anymore.  Dr. Smith Robert has already ordered a heart test and this will be done on 17 July.  We will see him in follow-up in 4 to 6 weeks time call sooner should any new problems arise.

## 2023-06-28 ENCOUNTER — Other Ambulatory Visit (INDEPENDENT_AMBULATORY_CARE_PROVIDER_SITE_OTHER): Payer: Self-pay | Admitting: Oncology

## 2023-07-06 ENCOUNTER — Inpatient Hospital Stay: Payer: Medicaid Other | Attending: Oncology

## 2023-07-12 ENCOUNTER — Ambulatory Visit: Admission: RE | Admit: 2023-07-12 | Payer: Medicaid Other | Source: Ambulatory Visit

## 2023-07-26 ENCOUNTER — Other Ambulatory Visit: Payer: Self-pay | Admitting: Oncology

## 2023-07-26 MED ORDER — OXYCODONE HCL 10 MG PO TABS: 10.0000 mg | ORAL_TABLET | ORAL | 0 refills | Status: DC | PRN

## 2023-08-07 ENCOUNTER — Encounter: Payer: Self-pay | Admitting: Pulmonary Disease

## 2023-08-18 ENCOUNTER — Encounter: Payer: Self-pay | Admitting: Cardiology

## 2023-08-18 ENCOUNTER — Ambulatory Visit: Payer: Medicaid Other | Attending: Cardiology | Admitting: Cardiology

## 2023-08-18 VITALS — BP 110/72 | HR 84 | Ht 74.0 in | Wt 179.0 lb

## 2023-08-18 DIAGNOSIS — I2510 Atherosclerotic heart disease of native coronary artery without angina pectoris: Secondary | ICD-10-CM | POA: Diagnosis not present

## 2023-08-18 DIAGNOSIS — I3139 Other pericardial effusion (noninflammatory): Secondary | ICD-10-CM | POA: Diagnosis not present

## 2023-08-18 DIAGNOSIS — I2584 Coronary atherosclerosis due to calcified coronary lesion: Secondary | ICD-10-CM

## 2023-08-18 DIAGNOSIS — F17200 Nicotine dependence, unspecified, uncomplicated: Secondary | ICD-10-CM | POA: Diagnosis not present

## 2023-08-18 NOTE — Patient Instructions (Signed)
Medication Instructions:   Your physician recommends that you continue on your current medications as directed. Please refer to the Current Medication list given to you today.  *If you need a refill on your cardiac medications before your next appointment, please call your pharmacy*   Lab Work:  Your provider would like for you to return at the time of your Echocardiogram to have the following labs drawn: LIPID.   Please go to [facility] [address] ([facility] [address], [place] [address] You do not need an appointment.  They are open from 7:30 am-4 pm.  Lunch from 1:00 pm- 2:00 pm You WILL need to be fasting.   If you have labs (blood work) drawn today and your tests are completely normal, you will receive your results only by: MyChart Message (if you have MyChart) OR A paper copy in the mail If you have any lab test that is abnormal or we need to change your treatment, we will call you to review the results.   Testing/Procedures:  Your physician has requested that you have an echocardiogram. Echocardiography is a painless test that uses sound waves to create images of your heart. It provides your doctor with information about the size and shape of your heart and how well your heart's chambers and valves are working. This procedure takes approximately one hour. There are no restrictions for this procedure. Please do NOT wear cologne, perfume, aftershave, or lotions (deodorant is allowed). Please arrive 15 minutes prior to your appointment time.    Follow-Up: At [facility], you and your health needs are our priority.  As part of our continuing mission to provide you with exceptional heart care, we have created designated Provider Care Teams.  These Care Teams include your primary Cardiologist (physician) and Advanced Practice Providers (APPs -  Physician Assistants and Nurse Practitioners) who all work together to provide you with the care you  need, when you need it.  We recommend signing up for the patient portal called "MyChart".  Sign up information is provided on this After Visit Summary.  MyChart is used to connect with patients for Virtual Visits (Telemedicine).  Patients are able to view lab/test results, encounter notes, upcoming appointments, etc.  Non-urgent messages can be sent to your provider as well.   To learn more about what you can do with MyChart, go to [email or web address].    Your next appointment:    After Echocardiogram  Provider:   You may see Debbe Odea, MD or one of the following Advanced Practice Providers on your designated Care Team:   Nicolasa Ducking, NP Eula Listen, PA-C Cadence Fransico Michael, PA-C Charlsie Quest, NP

## 2023-08-18 NOTE — Progress Notes (Signed)
Cardiology Office Note:    Date:  08/18/2023   ID:  Wesley Ferguson, DOB 11-09-1966, MRN [id number]  PCP:  Wesley Ferguson   [facility] Providers Cardiologist:  Wesley Odea, MD     Referring MD: Wesley Hines, MD   Chief Complaint  Patient presents with   New Patient (Initial Visit)    Referred for cardiac evaluation of Pericardial effusion with no personal cardiac history.      History of Present Illness:    Wesley Ferguson is a 57 y.o. male with a hx of lung cancer, COPD, current smoker x 40+ years, stage III lung cancer s/p RT, pericardial effusion who presents due to pericardial effusion.  Had echocardiogram November 2023 after chest CT for malignancy workup showed pericardial effusion.  Echo at the time showed a small circumferential pericardial effusion with no evidence for tamponade.  He had a repeat CT 05/25/2023 showing worsening pericardial effusion in the moderate range.  Coronary calcifications also noted.  Endorse shortness of breath, denies chest pain.  He still smokes.  Former heavy drinker.  Prior notes/studies Echo 10/2022 EF 55 to 60%, small circumferential pericardial effusion  Past Medical History:  Diagnosis Date   COPD (chronic obstructive pulmonary disease) (HCC)    Emphysema of lung (HCC)    Lung cancer (HCC) 06/2020   Pneumonia     Past Surgical History:  Procedure Laterality Date   BACK SURGERY  2017   lumbar region herniated disc   CARDIAC VALVE REPLACEMENT     ELECTROMAGNETIC NAVIGATION BROCHOSCOPY Left 10/21/2022   Procedure: ELECTROMAGNETIC NAVIGATION BRONCHOSCOPY;  Surgeon: Wesley Saner, MD;  Location: [facility] ORS;  Service: Cardiopulmonary;  Laterality: Left;   VIDEO BRONCHOSCOPY WITH ENDOBRONCHIAL NAVIGATION N/A 08/07/2020   Procedure: VIDEO BRONCHOSCOPY WITH ENDOBRONCHIAL NAVIGATION;  Surgeon: Wesley Saner, MD;  Location: [facility] ORS;  Service: Pulmonary;  Laterality: N/A;   VIDEO BRONCHOSCOPY WITH ENDOBRONCHIAL ULTRASOUND  N/A 08/07/2020   Procedure: VIDEO BRONCHOSCOPY WITH ENDOBRONCHIAL ULTRASOUND;  Surgeon: Wesley Saner, MD;  Location: [facility] ORS;  Service: Pulmonary;  Laterality: N/A;    Current Medications: Current Meds  Medication Sig   acetaminophen (TYLENOL) 500 MG tablet Take 500-1,000 mg by mouth every 6 (six) hours as needed (for pain.).   albuterol (PROVENTIL) (2.5 MG/3ML) 0.083% nebulizer solution Take 3 mLs (2.5 mg total) by nebulization every 6 (six) hours as needed for wheezing or shortness of breath.   albuterol (VENTOLIN HFA) 108 (90 Base) MCG/ACT inhaler TAKE 2 PUFFS BY MOUTH EVERY 6 HOURS AS NEEDED FOR WHEEZE OR SHORTNESS OF BREATH   Budeson-Glycopyrrol-Formoterol (BREZTRI AEROSPHERE) 160-9-4.8 MCG/ACT AERO Inhale 2 puffs into the lungs in the morning and at bedtime.   ipratropium-albuterol (DUONEB) 0.5-2.5 (3) MG/3ML SOLN Take 3 mLs by nebulization every 6 (six) hours as needed (Shortness of breath or wheezing).   levothyroxine (SYNTHROID) 137 MCG tablet Take 1 tablet (137 mcg total) by mouth daily before breakfast.   morphine (MS CONTIN) 30 MG 12 hr tablet Take 1 tablet (30 mg total) by mouth every 12 (twelve) hours.   Oxycodone HCl 10 MG TABS Take 1 tablet (10 mg total) by mouth every 4 (four) hours as needed.   pantoprazole (PROTONIX) 20 MG tablet Take 1 tablet (20 mg total) by mouth daily. Take the pill in am 1 hour prior to eating food each day   prochlorperazine (COMPAZINE) 10 MG tablet Take 1 tablet (10 mg total) by mouth every 6 (six) hours as needed for nausea  or vomiting.     Allergies:   Patient has no known allergies.   Social History   Socioeconomic History   Marital status: Divorced    Spouse name: Not on file   Number of children: Not on file   Years of education: Not on file   Highest education level: Not on file  Occupational History   Not on file  Tobacco Use   Smoking status: Every Day    Current packs/day: 4.00    Average packs/day: 4.0 packs/day for 35.0  years (140.0 ttl pk-yrs)    Types: Cigarettes   Smokeless tobacco: Former    Types: Chew   Tobacco comments:    2-2.5 PPD khj 06/20/2023  Vaping Use   Vaping status: Never Used  Substance and Sexual Activity   Alcohol use: Not Currently    Comment: 1 beer yest and not before 1 month ago   Drug use: No   Sexual activity: Not Currently  Other Topics Concern   Not on file  Social History Narrative   Not on file   Social Determinants of Health   Financial Resource Strain: Not on file  Food Insecurity: Not on file  Transportation Needs: Not on file  Physical Activity: Not on file  Stress: Not on file  Social Connections: Not on file     Family History: The patient's family history includes Diabetes in his father; Heart attack in his father; Lung cancer (age of onset: 67) in his father; Throat cancer in his maternal aunt.  ROS:   Please see the history of present illness.     All other systems reviewed and are negative.  EKGs/Labs/Other Studies Reviewed:    The following studies were reviewed today:  EKG Interpretation Date/Time:  Friday August 18 2023 15:22:07 EDT Ventricular Rate:  84 PR Interval:  184 QRS Duration:  74 QT Interval:  360 QTC Calculation: 425 R Axis:   88  Text Interpretation: Normal sinus rhythm Low voltage QRS Septal infarct , age undetermined Confirmed by Wesley Ferguson ([id number]) on 08/18/2023 3:41:27 PM    Recent Labs: 03/20/2023: ALT 23; BUN 11; Hemoglobin 10.2; Platelets 214; Potassium 4.1; Sodium 127 05/25/2023: Creatinine, Ser 1.00  Recent Lipid Panel No results found for: "CHOL", "TRIG", "HDL", "CHOLHDL", "VLDL", "LDLCALC", "LDLDIRECT"   Risk Assessment/Calculations:             Physical Exam:    VS:  BP 110/72 (BP Location: Right Arm, Patient Position: Sitting, Cuff Size: Normal)   Pulse 84   Ht 6\' 2"  (1.88 m)   Wt 179 lb (81.2 kg)   SpO2 100%   BMI 22.98 kg/m     Wt Readings from Last 3 Encounters:  08/18/23 179 lb (81.2  kg)  06/20/23 183 lb 12.8 oz (83.4 kg)  06/06/23 182 lb 8 oz (82.8 kg)     GEN:  Well nourished, well developed in no acute distress HEENT: Normal NECK: No JVD; No carotid bruits CARDIAC: RRR, no murmurs, rubs, gallops RESPIRATORY: Diminished breath sounds, no wheezing. ABDOMEN: Soft, non-tender, non-distended MUSCULOSKELETAL:  No edema; No deformity  SKIN: Warm and dry NEUROLOGIC:  Alert and oriented x 3 PSYCHIATRIC:  Normal affect   ASSESSMENT:    1. Pericardial effusion   2. Smoking   3. Coronary artery calcification    PLAN:    In order of problems listed above:  Pericardial effusion, repeat echo to evaluate any progression.  May consider Lasix. Current smoker, smoking cessation advised. Coronary calcification, obtain  lipid panel.  Lung malignancy, anemia, avoid ASA for now.  Follow-up after echo.      Medication Adjustments/Labs and Tests Ordered: Current medicines are reviewed at length with the patient today.  Concerns regarding medicines are outlined above.  Orders Placed This Encounter  Procedures   Lipid panel   EKG 12-Lead   ECHOCARDIOGRAM COMPLETE   No orders of the defined types were placed in this encounter.   Patient Instructions  Medication Instructions:   Your physician recommends that you continue on your current medications as directed. Please refer to the Current Medication list given to you today.  *If you need a refill on your cardiac medications before your next appointment, please call your pharmacy*   Lab Work:  Your provider would like for you to return at the time of your Echocardiogram to have the following labs drawn: LIPID.   Please go to [facility] [address] ([facility]) [address], [place] [address] You do not need an appointment.  They are open from 7:30 am-4 pm.  Lunch from 1:00 pm- 2:00 pm You WILL need to be fasting.   If you have labs (blood work) drawn today and your tests are completely  normal, you will receive your results only by: MyChart Message (if you have MyChart) OR A paper copy in the mail If you have any lab test that is abnormal or we need to change your treatment, we will call you to review the results.   Testing/Procedures:  Your physician has requested that you have an echocardiogram. Echocardiography is a painless test that uses sound waves to create images of your heart. It provides your doctor with information about the size and shape of your heart and how well your heart's chambers and valves are working. This procedure takes approximately one hour. There are no restrictions for this procedure. Please do NOT wear cologne, perfume, aftershave, or lotions (deodorant is allowed). Please arrive 15 minutes prior to your appointment time.    Follow-Up: At [facility], you and your health needs are our priority.  As part of our continuing mission to provide you with exceptional heart care, we have created designated Provider Care Teams.  These Care Teams include your primary Cardiologist (physician) and Advanced Practice Providers (APPs -  Physician Assistants and Nurse Practitioners) who all work together to provide you with the care you need, when you need it.  We recommend signing up for the patient portal called "MyChart".  Sign up information is provided on this After Visit Summary.  MyChart is used to connect with patients for Virtual Visits (Telemedicine).  Patients are able to view lab/test results, encounter notes, upcoming appointments, etc.  Non-urgent messages can be sent to your provider as well.   To learn more about what you can do with MyChart, go to [email or web address].    Your next appointment:    After Echocardiogram  Provider:   You may see Wesley Odea, MD or one of the following Advanced Practice Providers on your designated Care Team:   Nicolasa Ducking, NP Eula Listen, PA-C Cadence Fransico Michael, PA-C Charlsie Quest, NP    Signed, Wesley Odea, MD  08/18/2023 4:50 PM    [facility]

## 2023-08-22 ENCOUNTER — Other Ambulatory Visit: Payer: Self-pay | Admitting: Oncology

## 2023-08-25 MED ORDER — OXYCODONE HCL 10 MG PO TABS: 10.0000 mg | ORAL_TABLET | ORAL | 0 refills | Status: DC | PRN

## 2023-08-29 ENCOUNTER — Encounter: Payer: Self-pay | Admitting: Nurse Practitioner

## 2023-08-29 ENCOUNTER — Other Ambulatory Visit: Payer: Self-pay | Admitting: *Deleted

## 2023-08-29 MED ORDER — PROCHLORPERAZINE MALEATE 10 MG PO TABS: 10.0000 mg | ORAL_TABLET | Freq: Four times a day (QID) | ORAL | 1 refills | Status: DC | PRN

## 2023-09-04 ENCOUNTER — Ambulatory Visit
Admission: RE | Admit: 2023-09-04 | Discharge: 2023-09-04 | Disposition: A | Discharge status: Home / Self Care | Payer: Medicaid Other | Source: Ambulatory Visit | Attending: Oncology | Admitting: Oncology

## 2023-09-04 DIAGNOSIS — I3139 Other pericardial effusion (noninflammatory): Secondary | ICD-10-CM | POA: Insufficient documentation

## 2023-09-04 DIAGNOSIS — J441 Chronic obstructive pulmonary disease with (acute) exacerbation: Secondary | ICD-10-CM | POA: Insufficient documentation

## 2023-09-04 DIAGNOSIS — C3490 Malignant neoplasm of unspecified part of unspecified bronchus or lung: Secondary | ICD-10-CM | POA: Diagnosis present

## 2023-09-04 MED ORDER — IOHEXOL 300 MG/ML  SOLN
100.0000 mL | Freq: Once | INTRAMUSCULAR | Status: AC | PRN
  Administered 2023-09-04: 100 mL via INTRAVENOUS

## 2023-09-11 ENCOUNTER — Other Ambulatory Visit: Payer: Self-pay | Admitting: *Deleted

## 2023-09-11 DIAGNOSIS — C3412 Malignant neoplasm of upper lobe, left bronchus or lung: Secondary | ICD-10-CM

## 2023-09-12 ENCOUNTER — Inpatient Hospital Stay: Payer: Medicaid Other | Admitting: Oncology

## 2023-09-12 ENCOUNTER — Inpatient Hospital Stay: Payer: Medicaid Other

## 2023-09-13 ENCOUNTER — Ambulatory Visit: Payer: Medicaid Other | Attending: Cardiology

## 2023-09-13 DIAGNOSIS — I3139 Other pericardial effusion (noninflammatory): Secondary | ICD-10-CM

## 2023-09-13 DIAGNOSIS — I2584 Coronary atherosclerosis due to calcified coronary lesion: Secondary | ICD-10-CM | POA: Diagnosis not present

## 2023-09-13 DIAGNOSIS — I2510 Atherosclerotic heart disease of native coronary artery without angina pectoris: Secondary | ICD-10-CM | POA: Diagnosis not present

## 2023-09-13 LAB — ECHOCARDIOGRAM COMPLETE
Area-P 1/2: 7.66 cm2
S' Lateral: 3.1 cm

## 2023-09-18 ENCOUNTER — Other Ambulatory Visit: Payer: Self-pay

## 2023-09-18 MED ORDER — FUROSEMIDE 20 MG PO TABS: 20.0000 mg | ORAL_TABLET | Freq: Every day | ORAL | 3 refills | Status: AC

## 2023-09-18 NOTE — Progress Notes (Deleted)
Cardiology Office Note    Date:  09/18/2023   ID:  Wesley Ferguson, DOB 01-22-1966, MRN [id number]  PCP:  Pcp, No  Cardiologist:  Debbe Odea, MD  Electrophysiologist:  None   Chief Complaint: Follow-up  History of Present Illness:   Wesley Ferguson is a 57 y.o. 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  He was initially diagnosed with pulmonary mass in 06/2020 with tissue biopsy consistent with non-small cell carcinoma favored to be squamous cell carcinoma status post concurrent chemoradiation.  Echo from 10/2022 showed an EF of 55 to 60%, no regional wall motion abnormalities, normal LV diastolic function parameters, normal RV systolic function and ventricular cavity size, small pericardial effusion that was circumferential and without evidence of cardiac tamponade, no significant valvular abnormalities, and an estimated right atrial pressure of 3 mmHg.  More recently, he had biopsy-proven recurrence of malignancy.  He declined any further chemotherapy with plans for palliative radiation alone.  Repeat CT for cancer imaging in 04/2023 showed progressive pericardial effusion in the moderate range artery calcification noted as well.  In this setting, he was evaluated by Dr. Azucena Cecil in 07/2023 and reported shortness of breath without frank angina.  He continued to smoke.  Echo on 09/13/2023 showed an EF of 50 to 55%, no regional wall motion abnormalities, grade 1 diastolic dysfunction, normal RV systolic function and ventricular cavity size, moderate pericardial effusion that was circumferential and without evidence of cardiac tamponade mild aortic regurgitation, and an estimated right atrial pressure of 3 mmHg.  He was started on furosemide 20 mg daily.  ***   Labs independently  reviewed: 08/2023 - TC 155, TG 132, HDL 50, LDL 82 02/2023 - Hgb 10.2, PLT 214, potassium 4.1, BUN 11, serum creatinine 0.84, albumin 4.1, AST/ALT normal 01/2022 - TSH 9.670  Past Medical History:  Diagnosis Date   COPD (chronic obstructive pulmonary disease) (HCC)    Emphysema of lung (HCC)    Lung cancer (HCC) 06/2020   Pneumonia     Past Surgical History:  Procedure Laterality Date   BACK SURGERY  2017   lumbar region herniated disc   CARDIAC VALVE REPLACEMENT     ELECTROMAGNETIC NAVIGATION BROCHOSCOPY Left 10/21/2022   Procedure: ELECTROMAGNETIC NAVIGATION BRONCHOSCOPY;  Surgeon: Salena Saner, MD;  Location: [facility] ORS;  Service: Cardiopulmonary;  Laterality: Left;   VIDEO BRONCHOSCOPY WITH ENDOBRONCHIAL NAVIGATION N/A 08/07/2020   Procedure: VIDEO BRONCHOSCOPY WITH ENDOBRONCHIAL NAVIGATION;  Surgeon: Salena Saner, MD;  Location: [facility] ORS;  Service: Pulmonary;  Laterality: N/A;   VIDEO BRONCHOSCOPY WITH ENDOBRONCHIAL ULTRASOUND N/A 08/07/2020   Procedure: VIDEO BRONCHOSCOPY WITH ENDOBRONCHIAL ULTRASOUND;  Surgeon: Salena Saner, MD;  Location: [facility] ORS;  Service: Pulmonary;  Laterality: N/A;    Current Medications: No outpatient medications have been marked as taking for the 09/19/23 encounter (Appointment) with Sondra Barges, PA-C.    Allergies:   Patient has no known allergies.   Social History   Socioeconomic History   Marital status: Divorced    Spouse name: Not on file   Number of children: Not on file   Years of education: Not on file   Highest education level: Not on file  Occupational History   Not on file  Tobacco Use   Smoking status: Every Day    Current packs/day:  4.00    Average packs/day: 4.0 packs/day for 35.0 years (140.0 ttl pk-yrs)    Types: Cigarettes   Smokeless tobacco: Former    Types: Chew   Tobacco comments:    2-2.5 PPD khj 06/20/2023  Vaping Use   Vaping status: Never Used  Substance and Sexual Activity   Alcohol use: Not  Currently    Comment: 1 beer yest and not before 1 month ago   Drug use: No   Sexual activity: Not Currently  Other Topics Concern   Not on file  Social History Narrative   Not on file   Social Determinants of Health   Financial Resource Strain: Not on file  Food Insecurity: Not on file  Transportation Needs: Not on file  Physical Activity: Not on file  Stress: Not on file  Social Connections: Not on file     Family History:  The patient's family history includes Diabetes in his father; Heart attack in his father; Lung cancer (age of onset: 30) in his father; Throat cancer in his maternal aunt.  ROS:   12-point review of systems is negative unless otherwise noted in the HPI.   EKGs/Labs/Other Studies Reviewed:    Studies reviewed were summarized above. The additional studies were reviewed today:  2D echo 09/13/2023: 1. Left ventricular ejection fraction, by estimation, is 50 to 55%. Left  ventricular ejection fraction by PLAX is 54 %. The left ventricle has low  normal function. The left ventricle has no regional wall motion  abnormalities. Left ventricular diastolic  parameters are consistent with Grade I diastolic dysfunction (impaired  relaxation). The average left ventricular global longitudinal strain is  -13.2 %.   2. Right ventricular systolic function is normal. The right ventricular  size is normal. Tricuspid regurgitation signal is inadequate for assessing  PA pressure.   3. Moderate pericardial effusion. The pericardial effusion is  circumferential. There is no evidence of cardiac tamponade.   4. The mitral valve is normal in structure. No evidence of mitral valve  regurgitation. No evidence of mitral stenosis.   5. The aortic valve is tricuspid. Aortic valve regurgitation is mild. No  aortic stenosis is present.   6. The inferior vena cava is normal in size with greater than 50%  respiratory variability, suggesting right atrial pressure of 3 mmHg.   __________  2D echo 11/14/2022: 1. Left ventricular ejection fraction, by estimation, is 55 to 60%. The  left ventricle has normal function. The left ventricle has no regional  wall motion abnormalities. Left ventricular diastolic parameters were  normal. The global longitudinal strain  is normal.   2. Right ventricular systolic function is normal. The right ventricular  size is normal. Tricuspid regurgitation signal is inadequate for assessing  PA pressure.   3. A small pericardial effusion is present. The pericardial effusion is  circumferential. There is no evidence of cardiac tamponade.   4. The mitral valve is normal in structure. No evidence of mitral valve  regurgitation. No evidence of mitral stenosis.   5. The aortic valve is normal in structure. Aortic valve regurgitation is  not visualized. No aortic stenosis is present.   6. The inferior vena cava is normal in size with greater than 50%  respiratory variability, suggesting right atrial pressure of 3 mmHg.    EKG:  EKG is ordered today.  The EKG ordered today demonstrates ***  Recent Labs: 03/20/2023: ALT 23; BUN 11; Hemoglobin 10.2; Platelets 214; Potassium 4.1; Sodium 127 05/25/2023: Creatinine, Ser 1.00  Recent Lipid Panel    Component Value Date/Time   CHOL 155 09/13/2023 0835   TRIG 132 09/13/2023 0835   HDL 50 09/13/2023 0835   CHOLHDL 3.1 09/13/2023 0835   LDLCALC 82 09/13/2023 0835    PHYSICAL EXAM:    VS:  There were no vitals taken for this visit.  BMI: There is no height or weight on file to calculate BMI.  Physical Exam  Wt Readings from Last 3 Encounters:  08/18/23 179 lb (81.2 kg)  06/20/23 183 lb 12.8 oz (83.4 kg)  06/06/23 182 lb 8 oz (82.8 kg)     ASSESSMENT & PLAN:   Pericardial effusion:  Coronary artery calcification: ***.  LDL 82 in 08/2023 with normal AST/ALT in 02/2023.  Tobacco use:   {Are you ordering a CV Procedure (e.g. stress test, cath, DCCV, TEE, etc)?   Press F2         :[id number]}     Disposition: F/u with Dr. Azucena Cecil or an APP in ***.   Medication Adjustments/Labs and Tests Ordered: Current medicines are reviewed at length with the patient today.  Concerns regarding medicines are outlined above. Medication changes, Labs and Tests ordered today are summarized above and listed in the Patient Instructions accessible in Encounters.   Signed, Eula Listen, PA-C 09/18/2023 9:22 AM     [facility] [place] [address] [place], [place] [address] [phone]

## 2023-09-19 ENCOUNTER — Inpatient Hospital Stay (HOSPITAL_BASED_OUTPATIENT_CLINIC_OR_DEPARTMENT_OTHER): Payer: Medicaid Other | Admitting: Oncology

## 2023-09-19 ENCOUNTER — Telehealth: Payer: Self-pay

## 2023-09-19 ENCOUNTER — Other Ambulatory Visit: Payer: Self-pay | Admitting: *Deleted

## 2023-09-19 ENCOUNTER — Ambulatory Visit: Payer: Medicaid Other | Attending: Physician Assistant | Admitting: Physician Assistant

## 2023-09-19 ENCOUNTER — Inpatient Hospital Stay: Payer: Medicaid Other | Attending: Oncology

## 2023-09-19 ENCOUNTER — Encounter: Payer: Self-pay | Admitting: Oncology

## 2023-09-19 VITALS — BP 125/84 | HR 85 | Temp 95.8°F | Resp 18 | Ht 74.0 in | Wt 181.3 lb

## 2023-09-19 DIAGNOSIS — I3139 Other pericardial effusion (noninflammatory): Secondary | ICD-10-CM | POA: Insufficient documentation

## 2023-09-19 DIAGNOSIS — Z8249 Family history of ischemic heart disease and other diseases of the circulatory system: Secondary | ICD-10-CM | POA: Insufficient documentation

## 2023-09-19 DIAGNOSIS — D1803 Hemangioma of intra-abdominal structures: Secondary | ICD-10-CM | POA: Diagnosis not present

## 2023-09-19 DIAGNOSIS — I700 Atherosclerosis of aorta: Secondary | ICD-10-CM | POA: Diagnosis not present

## 2023-09-19 DIAGNOSIS — K760 Fatty (change of) liver, not elsewhere classified: Secondary | ICD-10-CM | POA: Insufficient documentation

## 2023-09-19 DIAGNOSIS — R0602 Shortness of breath: Secondary | ICD-10-CM | POA: Insufficient documentation

## 2023-09-19 DIAGNOSIS — Z801 Family history of malignant neoplasm of trachea, bronchus and lung: Secondary | ICD-10-CM | POA: Insufficient documentation

## 2023-09-19 DIAGNOSIS — F17210 Nicotine dependence, cigarettes, uncomplicated: Secondary | ICD-10-CM | POA: Insufficient documentation

## 2023-09-19 DIAGNOSIS — Z85118 Personal history of other malignant neoplasm of bronchus and lung: Secondary | ICD-10-CM | POA: Diagnosis not present

## 2023-09-19 DIAGNOSIS — J90 Pleural effusion, not elsewhere classified: Secondary | ICD-10-CM | POA: Insufficient documentation

## 2023-09-19 DIAGNOSIS — Z833 Family history of diabetes mellitus: Secondary | ICD-10-CM | POA: Diagnosis not present

## 2023-09-19 DIAGNOSIS — Z08 Encounter for follow-up examination after completed treatment for malignant neoplasm: Secondary | ICD-10-CM | POA: Diagnosis not present

## 2023-09-19 DIAGNOSIS — Z79899 Other long term (current) drug therapy: Secondary | ICD-10-CM | POA: Insufficient documentation

## 2023-09-19 DIAGNOSIS — Z808 Family history of malignant neoplasm of other organs or systems: Secondary | ICD-10-CM | POA: Insufficient documentation

## 2023-09-19 DIAGNOSIS — J9610 Chronic respiratory failure, unspecified whether with hypoxia or hypercapnia: Secondary | ICD-10-CM | POA: Insufficient documentation

## 2023-09-19 DIAGNOSIS — R590 Localized enlarged lymph nodes: Secondary | ICD-10-CM | POA: Diagnosis not present

## 2023-09-19 DIAGNOSIS — C3412 Malignant neoplasm of upper lobe, left bronchus or lung: Secondary | ICD-10-CM | POA: Insufficient documentation

## 2023-09-19 DIAGNOSIS — Z28310 Unvaccinated for covid-19: Secondary | ICD-10-CM | POA: Diagnosis not present

## 2023-09-19 DIAGNOSIS — Z79890 Hormone replacement therapy (postmenopausal): Secondary | ICD-10-CM | POA: Insufficient documentation

## 2023-09-19 LAB — CBC WITH DIFFERENTIAL/PLATELET
Abs Immature Granulocytes: 0.04 10*3/uL (ref 0.00–0.07)
Basophils Absolute: 0.1 10*3/uL (ref 0.0–0.1)
Basophils Relative: 1 %
Eosinophils Absolute: 0.5 10*3/uL (ref 0.0–0.5)
Eosinophils Relative: 6 %
HCT: 29.4 % — ABNORMAL LOW (ref 39.0–52.0)
Hemoglobin: 9.8 g/dL — ABNORMAL LOW (ref 13.0–17.0)
Immature Granulocytes: 1 %
Lymphocytes Relative: 14 %
Lymphs Abs: 1.1 10*3/uL (ref 0.7–4.0)
MCH: 29.1 pg (ref 26.0–34.0)
MCHC: 33.3 g/dL (ref 30.0–36.0)
MCV: 87.2 fL (ref 80.0–100.0)
Monocytes Absolute: 0.6 10*3/uL (ref 0.1–1.0)
Monocytes Relative: 8 %
Neutro Abs: 5.3 10*3/uL (ref 1.7–7.7)
Neutrophils Relative %: 70 %
Platelets: 313 10*3/uL (ref 150–400)
RBC: 3.37 MIL/uL — ABNORMAL LOW (ref 4.22–5.81)
RDW: 14.8 % (ref 11.5–15.5)
WBC: 7.6 10*3/uL (ref 4.0–10.5)
nRBC: 0 % (ref 0.0–0.2)

## 2023-09-19 LAB — COMPREHENSIVE METABOLIC PANEL
ALT: 29 U/L (ref 0–44)
AST: 61 U/L — ABNORMAL HIGH (ref 15–41)
Albumin: 4.1 g/dL (ref 3.5–5.0)
Alkaline Phosphatase: 79 U/L (ref 38–126)
Anion gap: 7 (ref 5–15)
BUN: 9 mg/dL (ref 6–20)
CO2: 28 mmol/L (ref 22–32)
Calcium: 8.9 mg/dL (ref 8.9–10.3)
Chloride: 92 mmol/L — ABNORMAL LOW (ref 98–111)
Creatinine, Ser: 0.93 mg/dL (ref 0.61–1.24)
GFR, Estimated: >60 mL/min (ref >60)
Glucose, Bld: 94 mg/dL (ref 70–99)
Potassium: 4 mmol/L (ref 3.5–5.1)
Sodium: 127 mmol/L — ABNORMAL LOW (ref 135–145)
Total Bilirubin: 0.4 mg/dL (ref 0.3–1.2)
Total Protein: 7.5 g/dL (ref 6.5–8.1)

## 2023-09-19 MED ORDER — VENTOLIN HFA 108 (90 BASE) MCG/ACT IN AERS: 2.0000 | INHALATION_SPRAY | Freq: Four times a day (QID) | RESPIRATORY_TRACT | 2 refills | Status: DC | PRN

## 2023-09-19 NOTE — Progress Notes (Signed)
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  He had a chest x-ray followed by a CT scan which showed a 5.7 x 4.4 cm left upper lobe mass along with left hilar adenopathy.  There was a low-density 3.6 lesion noted in the right hepatic lobe which ultimately turned out to be a hemangioma on MRI liver.  PET CT scan showed hypermetabolic activity in the left upper lobe and left hilar nodal tissue.  Subcarinal and right paratracheal lymph nodes with mild hypermetabolic features.   Patient underwent bronchoscopies and biopsies.Left upper lobe biopsy was consistent with non-small cell carcinoma favor squamous cell carcinoma right subcarinal lymph node biopsy was negative for malignancy.   Plan is for concurrent chemoradiation with carbotaxol followed by maintenance durvalumab.  Patient does not want to get a port placed.  He is also refused Covid vaccination   NGS testing showed high tumor mutational burden.  PD-L1 90%.  MSI stable.  Patient PIK3CA and notch2 FCHO2 fusion, T p53   Scans in May 2022 Showed 2 hypermetabolic nodules in the left upper lobe and new hypermetabolic cavitary nodule in the right upper lobe concerning for disease progression.  No evidence of distant metastatic disease.  Patient was seen by Dr. Aggie Cosier and received SBRT  to the left lung with plans for continued monitoring of the right upper lobe lung nodule.  Patient completed maintenance durvalumab in March 2023     Repeat imaging has shown evolving changes in the left upper lobe with findings concerning for infection versus malignancy.  Patient therefore had a bronchoscopy which showed recurrent keratinizing squamous cell carcinoma.  PET CT scan did not show any evidence of distant metastatic disease.  Hypermetabolic necrotic masslike consolidation in the posterior upper lobe with hypermetabolic subcarinal adenopathy in November 2023.   Plan was to treat him with concurrent chemoradiation with weekly CarboTaxol for the second time.  He could not tolerate chemotherapy and therefore proceeded with radiation treatment alone    Interval history-patient has chronic exertional shortness of breath and fatigue which has remained unchanged.  He continues to smoke  ECOG PS- 2 Pain scale- 4 Opioid associated constipation- no  Review of systems- Review of Systems  Constitutional:  Positive for malaise/fatigue. Negative for chills, fever and weight loss.  HENT:  Negative for congestion, ear discharge and nosebleeds.   Eyes:  Negative for blurred vision.  Respiratory:  Positive for shortness of breath. Negative for cough, hemoptysis, sputum production and wheezing.   Cardiovascular:  Negative for chest pain, palpitations, orthopnea and claudication.  Gastrointestinal:  Negative for abdominal pain, blood in stool, constipation, diarrhea, heartburn, melena, nausea and vomiting.  Genitourinary:  Negative for dysuria, flank pain, frequency, hematuria and urgency.  Musculoskeletal:  Negative for back pain, joint pain and myalgias.  Skin:  Negative for rash.  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  He had a chest x-ray followed by a CT scan which showed a 5.7 x 4.4 cm left upper lobe mass along with left hilar adenopathy.  There was a low-density 3.6 lesion noted in the right hepatic lobe which ultimately turned out to be a hemangioma on MRI liver.  PET CT scan showed hypermetabolic activity in the left upper lobe and left hilar nodal tissue.  Subcarinal and right paratracheal lymph nodes with mild hypermetabolic features.   Patient underwent bronchoscopies and biopsies.Left upper lobe biopsy was consistent with non-small cell carcinoma favor squamous cell carcinoma right subcarinal lymph node biopsy was negative for malignancy.   Plan is for concurrent chemoradiation with carbotaxol followed by maintenance durvalumab.  Patient does not want to get a port placed.  He is also refused Covid vaccination   NGS testing showed high tumor mutational burden.  PD-L1 90%.  MSI stable.  Patient PIK3CA and notch2 FCHO2 fusion, T p53   Scans in May 2022 Showed 2 hypermetabolic nodules in the left upper lobe and new hypermetabolic cavitary nodule in the right upper lobe concerning for disease progression.  No evidence of distant metastatic disease.  Patient was seen by Dr. Aggie Cosier and received SBRT  to the left lung with plans for continued monitoring of the right upper lobe lung nodule.  Patient completed maintenance durvalumab in March 2023     Repeat imaging has shown evolving changes in the left upper lobe with findings concerning for infection versus malignancy.  Patient therefore had a bronchoscopy which showed recurrent keratinizing squamous cell carcinoma.  PET CT scan did not show any evidence of distant metastatic disease.  Hypermetabolic necrotic masslike consolidation in the posterior upper lobe with hypermetabolic subcarinal adenopathy in November 2023.   Plan was to treat him with concurrent chemoradiation with weekly CarboTaxol for the second time.  He could not tolerate chemotherapy and therefore proceeded with radiation treatment alone    Interval history-patient has chronic exertional shortness of breath and fatigue which has remained unchanged.  He continues to smoke  ECOG PS- 2 Pain scale- 4 Opioid associated constipation- no  Review of systems- Review of Systems  Constitutional:  Positive for malaise/fatigue. Negative for chills, fever and weight loss.  HENT:  Negative for congestion, ear discharge and nosebleeds.   Eyes:  Negative for blurred vision.  Respiratory:  Positive for shortness of breath. Negative for cough, hemoptysis, sputum production and wheezing.   Cardiovascular:  Negative for chest pain, palpitations, orthopnea and claudication.  Gastrointestinal:  Negative for abdominal pain, blood in stool, constipation, diarrhea, heartburn, melena, nausea and vomiting.  Genitourinary:  Negative for dysuria, flank pain, frequency, hematuria and urgency.  Musculoskeletal:  Negative for back pain, joint pain and myalgias.  Skin:  Negative for rash.  Neurological:  Negative for dizziness, tingling, focal weakness, seizures, weakness  137 mcg total) by mouth daily before breakfast., Disp: 90 tablet, Rfl: 1   morphine (MS CONTIN) 30 MG 12 hr tablet, Take 1 tablet (30 mg total) by mouth every 12 (twelve) hours., Disp: 60 tablet, Rfl: 0   Oxycodone HCl 10 MG TABS, Take 1 tablet (10 mg total) by mouth every 4 (four) hours as needed., Disp: 180 tablet, Rfl: 0   pantoprazole (PROTONIX) 20 MG tablet, Take 1 tablet (20 mg total) by mouth daily. Take the pill in am 1 hour prior to eating food each day, Disp: 30 tablet, Rfl: 3   prochlorperazine (COMPAZINE) 10 MG tablet, Take 1 tablet (10 mg total) by mouth every 6 (six) hours as needed for nausea or vomiting., Disp: 30 tablet, Rfl: 1   albuterol (VENTOLIN HFA) 108 (90 Base) MCG/ACT inhaler, Inhale 2 puffs into the lungs every 6 (six)  hours as needed for wheezing or shortness of breath., Disp: 18 g, Rfl: 2   sucralfate (CARAFATE) 1 g tablet, DISSOLVE 1 TABLET IN WARM WATER, SWISH AND SWALLOW THREE TIMES DAILY (Patient not taking: Reported on 08/18/2023), Disp: 90 tablet, Rfl: 2  Physical exam:  Vitals:   09/19/23 1311  BP: 125/84  Pulse: 85  Resp: 18  Temp: (!) 95.8 F (35.4 C)  TempSrc: Tympanic  SpO2: 100%  Weight: 181 lb 4.8 oz (82.2 kg)  Height: 6\' 2"  (1.88 m)   Physical Exam Cardiovascular:     Rate and Rhythm: Regular rhythm.     Heart sounds: Normal heart sounds.  Pulmonary:     Comments: Effort of breathing increased.  Breath sounds decreased bilaterally diffusely Abdominal:     General: Bowel sounds are normal.     Palpations: Abdomen is soft.  Skin:    General: Skin is warm and dry.  Neurological:     Mental Status: He is alert and oriented to person, place, and time.         Latest Ref Rng & Units 09/19/2023   12:56 PM  CMP  Glucose 70 - 99 mg/dL 94   BUN 6 - 20 mg/dL 9   Creatinine 1.91 - 4.78 mg/dL 2.95   Sodium 621 - 308 mmol/L 127   Potassium 3.5 - 5.1 mmol/L 4.0   Chloride 98 - 111 mmol/L 92   CO2 22 - 32 mmol/L 28   Calcium 8.9 - 10.3 mg/dL 8.9   Total Protein 6.5 - 8.1 g/dL 7.5   Total Bilirubin 0.3 - 1.2 mg/dL 0.4   Alkaline Phos 38 - 126 U/L 79   AST 15 - 41 U/L 61   ALT 0 - 44 U/L 29       Latest Ref Rng & Units 09/19/2023   12:56 PM  CBC  WBC 4.0 - 10.5 K/uL 7.6   Hemoglobin 13.0 - 17.0 g/dL 9.8   Hematocrit 65.7 - 52.0 % 29.4   Platelets 150 - 400 K/uL 313     No images are attached to the encounter.  ECHOCARDIOGRAM COMPLETE  Result Date: 09/13/2023    ECHOCARDIOGRAM REPORT   Patient Name:   Debbie JEARL RETTERER Date of Exam: 09/13/2023 Medical Rec #:  [id number]      Height:       74.0 in Accession #:    [id number]     Weight:       179.0 lb Date of Birth:  May 08, 1966      BSA:          2.073 m Patient Age:  Hematology/Oncology Consult note [facility]  [phone] [phone]  Patient Care Team: Pcp, No as PCP - General Debbe Odea, MD as PCP - Cardiology (Cardiology) Glory Buff, RN as Oncology Nurse Navigator Creig Hines, MD as Consulting Physician (Hematology and Oncology)   Name of the patient: Wesley Ferguson  [id number]  [birth year]   Date of visit: 09/19/23  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  He had a chest x-ray followed by a CT scan which showed a 5.7 x 4.4 cm left upper lobe mass along with left hilar adenopathy.  There was a low-density 3.6 lesion noted in the right hepatic lobe which ultimately turned out to be a hemangioma on MRI liver.  PET CT scan showed hypermetabolic activity in the left upper lobe and left hilar nodal tissue.  Subcarinal and right paratracheal lymph nodes with mild hypermetabolic features.   Patient underwent bronchoscopies and biopsies.Left upper lobe biopsy was consistent with non-small cell carcinoma favor squamous cell carcinoma right subcarinal lymph node biopsy was negative for malignancy.   Plan is for concurrent chemoradiation with carbotaxol followed by maintenance durvalumab.  Patient does not want to get a port placed.  He is also refused Covid vaccination   NGS testing showed high tumor mutational burden.  PD-L1 90%.  MSI stable.  Patient PIK3CA and notch2 FCHO2 fusion, T p53   Scans in May 2022 Showed 2 hypermetabolic nodules in the left upper lobe and new hypermetabolic cavitary nodule in the right upper lobe concerning for disease progression.  No evidence of distant metastatic disease.  Patient was seen by Dr. Aggie Cosier and received SBRT  to the left lung with plans for continued monitoring of the right upper lobe lung nodule.  Patient completed maintenance durvalumab in March 2023     Repeat imaging has shown evolving changes in the left upper lobe with findings concerning for infection versus malignancy.  Patient therefore had a bronchoscopy which showed recurrent keratinizing squamous cell carcinoma.  PET CT scan did not show any evidence of distant metastatic disease.  Hypermetabolic necrotic masslike consolidation in the posterior upper lobe with hypermetabolic subcarinal adenopathy in November 2023.   Plan was to treat him with concurrent chemoradiation with weekly CarboTaxol for the second time.  He could not tolerate chemotherapy and therefore proceeded with radiation treatment alone    Interval history-patient has chronic exertional shortness of breath and fatigue which has remained unchanged.  He continues to smoke  ECOG PS- 2 Pain scale- 4 Opioid associated constipation- no  Review of systems- Review of Systems  Constitutional:  Positive for malaise/fatigue. Negative for chills, fever and weight loss.  HENT:  Negative for congestion, ear discharge and nosebleeds.   Eyes:  Negative for blurred vision.  Respiratory:  Positive for shortness of breath. Negative for cough, hemoptysis, sputum production and wheezing.   Cardiovascular:  Negative for chest pain, palpitations, orthopnea and claudication.  Gastrointestinal:  Negative for abdominal pain, blood in stool, constipation, diarrhea, heartburn, melena, nausea and vomiting.  Genitourinary:  Negative for dysuria, flank pain, frequency, hematuria and urgency.  Musculoskeletal:  Negative for back pain, joint pain and myalgias.  Skin:  Negative for rash.  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  He had a chest x-ray followed by a CT scan which showed a 5.7 x 4.4 cm left upper lobe mass along with left hilar adenopathy.  There was a low-density 3.6 lesion noted in the right hepatic lobe which ultimately turned out to be a hemangioma on MRI liver.  PET CT scan showed hypermetabolic activity in the left upper lobe and left hilar nodal tissue.  Subcarinal and right paratracheal lymph nodes with mild hypermetabolic features.   Patient underwent bronchoscopies and biopsies.Left upper lobe biopsy was consistent with non-small cell carcinoma favor squamous cell carcinoma right subcarinal lymph node biopsy was negative for malignancy.   Plan is for concurrent chemoradiation with carbotaxol followed by maintenance durvalumab.  Patient does not want to get a port placed.  He is also refused Covid vaccination   NGS testing showed high tumor mutational burden.  PD-L1 90%.  MSI stable.  Patient PIK3CA and notch2 FCHO2 fusion, T p53   Scans in May 2022 Showed 2 hypermetabolic nodules in the left upper lobe and new hypermetabolic cavitary nodule in the right upper lobe concerning for disease progression.  No evidence of distant metastatic disease.  Patient was seen by Dr. Aggie Cosier and received SBRT  to the left lung with plans for continued monitoring of the right upper lobe lung nodule.  Patient completed maintenance durvalumab in March 2023     Repeat imaging has shown evolving changes in the left upper lobe with findings concerning for infection versus malignancy.  Patient therefore had a bronchoscopy which showed recurrent keratinizing squamous cell carcinoma.  PET CT scan did not show any evidence of distant metastatic disease.  Hypermetabolic necrotic masslike consolidation in the posterior upper lobe with hypermetabolic subcarinal adenopathy in November 2023.   Plan was to treat him with concurrent chemoradiation with weekly CarboTaxol for the second time.  He could not tolerate chemotherapy and therefore proceeded with radiation treatment alone    Interval history-patient has chronic exertional shortness of breath and fatigue which has remained unchanged.  He continues to smoke  ECOG PS- 2 Pain scale- 4 Opioid associated constipation- no  Review of systems- Review of Systems  Constitutional:  Positive for malaise/fatigue. Negative for chills, fever and weight loss.  HENT:  Negative for congestion, ear discharge and nosebleeds.   Eyes:  Negative for blurred vision.  Respiratory:  Positive for shortness of breath. Negative for cough, hemoptysis, sputum production and wheezing.   Cardiovascular:  Negative for chest pain, palpitations, orthopnea and claudication.  Gastrointestinal:  Negative for abdominal pain, blood in stool, constipation, diarrhea, heartburn, melena, nausea and vomiting.  Genitourinary:  Negative for dysuria, flank pain, frequency, hematuria and urgency.  Musculoskeletal:  Negative for back pain, joint pain and myalgias.  Skin:  Negative for rash.  Neurological:  Negative for dizziness, tingling, focal weakness, seizures, weakness  137 mcg total) by mouth daily before breakfast., Disp: 90 tablet, Rfl: 1   morphine (MS CONTIN) 30 MG 12 hr tablet, Take 1 tablet (30 mg total) by mouth every 12 (twelve) hours., Disp: 60 tablet, Rfl: 0   Oxycodone HCl 10 MG TABS, Take 1 tablet (10 mg total) by mouth every 4 (four) hours as needed., Disp: 180 tablet, Rfl: 0   pantoprazole (PROTONIX) 20 MG tablet, Take 1 tablet (20 mg total) by mouth daily. Take the pill in am 1 hour prior to eating food each day, Disp: 30 tablet, Rfl: 3   prochlorperazine (COMPAZINE) 10 MG tablet, Take 1 tablet (10 mg total) by mouth every 6 (six) hours as needed for nausea or vomiting., Disp: 30 tablet, Rfl: 1   albuterol (VENTOLIN HFA) 108 (90 Base) MCG/ACT inhaler, Inhale 2 puffs into the lungs every 6 (six)  hours as needed for wheezing or shortness of breath., Disp: 18 g, Rfl: 2   sucralfate (CARAFATE) 1 g tablet, DISSOLVE 1 TABLET IN WARM WATER, SWISH AND SWALLOW THREE TIMES DAILY (Patient not taking: Reported on 08/18/2023), Disp: 90 tablet, Rfl: 2  Physical exam:  Vitals:   09/19/23 1311  BP: 125/84  Pulse: 85  Resp: 18  Temp: (!) 95.8 F (35.4 C)  TempSrc: Tympanic  SpO2: 100%  Weight: 181 lb 4.8 oz (82.2 kg)  Height: 6\' 2"  (1.88 m)   Physical Exam Cardiovascular:     Rate and Rhythm: Regular rhythm.     Heart sounds: Normal heart sounds.  Pulmonary:     Comments: Effort of breathing increased.  Breath sounds decreased bilaterally diffusely Abdominal:     General: Bowel sounds are normal.     Palpations: Abdomen is soft.  Skin:    General: Skin is warm and dry.  Neurological:     Mental Status: He is alert and oriented to person, place, and time.         Latest Ref Rng & Units 09/19/2023   12:56 PM  CMP  Glucose 70 - 99 mg/dL 94   BUN 6 - 20 mg/dL 9   Creatinine 1.91 - 4.78 mg/dL 2.95   Sodium 621 - 308 mmol/L 127   Potassium 3.5 - 5.1 mmol/L 4.0   Chloride 98 - 111 mmol/L 92   CO2 22 - 32 mmol/L 28   Calcium 8.9 - 10.3 mg/dL 8.9   Total Protein 6.5 - 8.1 g/dL 7.5   Total Bilirubin 0.3 - 1.2 mg/dL 0.4   Alkaline Phos 38 - 126 U/L 79   AST 15 - 41 U/L 61   ALT 0 - 44 U/L 29       Latest Ref Rng & Units 09/19/2023   12:56 PM  CBC  WBC 4.0 - 10.5 K/uL 7.6   Hemoglobin 13.0 - 17.0 g/dL 9.8   Hematocrit 65.7 - 52.0 % 29.4   Platelets 150 - 400 K/uL 313     No images are attached to the encounter.  ECHOCARDIOGRAM COMPLETE  Result Date: 09/13/2023    ECHOCARDIOGRAM REPORT   Patient Name:   Debbie JEARL RETTERER Date of Exam: 09/13/2023 Medical Rec #:  [id number]      Height:       74.0 in Accession #:    [id number]     Weight:       179.0 lb Date of Birth:  May 08, 1966      BSA:          2.073 m Patient Age:  137 mcg total) by mouth daily before breakfast., Disp: 90 tablet, Rfl: 1   morphine (MS CONTIN) 30 MG 12 hr tablet, Take 1 tablet (30 mg total) by mouth every 12 (twelve) hours., Disp: 60 tablet, Rfl: 0   Oxycodone HCl 10 MG TABS, Take 1 tablet (10 mg total) by mouth every 4 (four) hours as needed., Disp: 180 tablet, Rfl: 0   pantoprazole (PROTONIX) 20 MG tablet, Take 1 tablet (20 mg total) by mouth daily. Take the pill in am 1 hour prior to eating food each day, Disp: 30 tablet, Rfl: 3   prochlorperazine (COMPAZINE) 10 MG tablet, Take 1 tablet (10 mg total) by mouth every 6 (six) hours as needed for nausea or vomiting., Disp: 30 tablet, Rfl: 1   albuterol (VENTOLIN HFA) 108 (90 Base) MCG/ACT inhaler, Inhale 2 puffs into the lungs every 6 (six)  hours as needed for wheezing or shortness of breath., Disp: 18 g, Rfl: 2   sucralfate (CARAFATE) 1 g tablet, DISSOLVE 1 TABLET IN WARM WATER, SWISH AND SWALLOW THREE TIMES DAILY (Patient not taking: Reported on 08/18/2023), Disp: 90 tablet, Rfl: 2  Physical exam:  Vitals:   09/19/23 1311  BP: 125/84  Pulse: 85  Resp: 18  Temp: (!) 95.8 F (35.4 C)  TempSrc: Tympanic  SpO2: 100%  Weight: 181 lb 4.8 oz (82.2 kg)  Height: 6\' 2"  (1.88 m)   Physical Exam Cardiovascular:     Rate and Rhythm: Regular rhythm.     Heart sounds: Normal heart sounds.  Pulmonary:     Comments: Effort of breathing increased.  Breath sounds decreased bilaterally diffusely Abdominal:     General: Bowel sounds are normal.     Palpations: Abdomen is soft.  Skin:    General: Skin is warm and dry.  Neurological:     Mental Status: He is alert and oriented to person, place, and time.         Latest Ref Rng & Units 09/19/2023   12:56 PM  CMP  Glucose 70 - 99 mg/dL 94   BUN 6 - 20 mg/dL 9   Creatinine 1.91 - 4.78 mg/dL 2.95   Sodium 621 - 308 mmol/L 127   Potassium 3.5 - 5.1 mmol/L 4.0   Chloride 98 - 111 mmol/L 92   CO2 22 - 32 mmol/L 28   Calcium 8.9 - 10.3 mg/dL 8.9   Total Protein 6.5 - 8.1 g/dL 7.5   Total Bilirubin 0.3 - 1.2 mg/dL 0.4   Alkaline Phos 38 - 126 U/L 79   AST 15 - 41 U/L 61   ALT 0 - 44 U/L 29       Latest Ref Rng & Units 09/19/2023   12:56 PM  CBC  WBC 4.0 - 10.5 K/uL 7.6   Hemoglobin 13.0 - 17.0 g/dL 9.8   Hematocrit 65.7 - 52.0 % 29.4   Platelets 150 - 400 K/uL 313     No images are attached to the encounter.  ECHOCARDIOGRAM COMPLETE  Result Date: 09/13/2023    ECHOCARDIOGRAM REPORT   Patient Name:   Debbie JEARL RETTERER Date of Exam: 09/13/2023 Medical Rec #:  [id number]      Height:       74.0 in Accession #:    [id number]     Weight:       179.0 lb Date of Birth:  May 08, 1966      BSA:          2.073 m Patient Age:

## 2023-09-19 NOTE — Telephone Encounter (Signed)
I spoke with the patient's daughter (DPR) and scheduled him an appt for 10/1 at 2:30pm.  Nothing further needed.

## 2023-09-19 NOTE — Telephone Encounter (Signed)
Per secure chat from Dr. Wilmon Arms can we get Wesley Ferguson in on my schedule in the next week or 2?   ATC the patient. I did not get an answer and his voicemail box was full. I will try again later.

## 2023-09-20 ENCOUNTER — Encounter: Payer: Self-pay | Admitting: Physician Assistant

## 2023-09-20 MED ORDER — OXYCODONE HCL 10 MG PO TABS: 10.0000 mg | ORAL_TABLET | ORAL | 0 refills | Status: DC | PRN

## 2023-09-26 ENCOUNTER — Ambulatory Visit: Payer: Medicaid Other | Admitting: Pulmonary Disease

## 2023-09-26 ENCOUNTER — Other Ambulatory Visit: Payer: Self-pay | Admitting: Radiation Oncology

## 2023-10-23 ENCOUNTER — Other Ambulatory Visit: Payer: Self-pay | Admitting: Oncology

## 2023-10-25 MED ORDER — OXYCODONE HCL 10 MG PO TABS: 10.0000 mg | ORAL_TABLET | ORAL | 0 refills | Status: DC | PRN

## 2023-11-17 ENCOUNTER — Other Ambulatory Visit: Payer: Self-pay | Admitting: Oncology

## 2023-11-17 MED ORDER — OXYCODONE HCL 10 MG PO TABS: 10.0000 mg | ORAL_TABLET | ORAL | 0 refills | Status: DC | PRN

## 2023-12-18 ENCOUNTER — Other Ambulatory Visit: Payer: Self-pay | Admitting: Oncology

## 2023-12-19 MED ORDER — OXYCODONE HCL 10 MG PO TABS: 10.0000 mg | ORAL_TABLET | ORAL | 0 refills | Status: DC | PRN

## 2023-12-21 ENCOUNTER — Ambulatory Visit: Payer: Medicaid Other | Admitting: Pulmonary Disease

## 2023-12-28 NOTE — Telephone Encounter (Signed)
 Created in error

## 2024-01-15 ENCOUNTER — Other Ambulatory Visit: Payer: Self-pay | Admitting: Pulmonary Disease

## 2024-01-20 ENCOUNTER — Other Ambulatory Visit: Payer: Self-pay | Admitting: Oncology

## 2024-01-22 MED ORDER — OXYCODONE HCL 10 MG PO TABS: 10.0000 mg | ORAL_TABLET | ORAL | 0 refills | Status: DC | PRN

## 2024-02-19 ENCOUNTER — Encounter: Payer: Self-pay | Admitting: Oncology

## 2024-02-19 ENCOUNTER — Inpatient Hospital Stay: Payer: Medicaid Other | Attending: Oncology | Admitting: Oncology

## 2024-02-22 ENCOUNTER — Other Ambulatory Visit: Payer: Self-pay | Admitting: Pulmonary Disease

## 2024-02-22 ENCOUNTER — Other Ambulatory Visit: Payer: Self-pay | Admitting: Oncology

## 2024-02-22 MED ORDER — ALBUTEROL SULFATE (2.5 MG/3ML) 0.083% IN NEBU: 2.5000 mg | INHALATION_SOLUTION | Freq: Four times a day (QID) | RESPIRATORY_TRACT | 0 refills | Status: DC | PRN

## 2024-02-23 ENCOUNTER — Telehealth: Payer: Self-pay | Admitting: Oncology

## 2024-02-23 MED ORDER — OXYCODONE HCL 10 MG PO TABS: 10.0000 mg | ORAL_TABLET | ORAL | 0 refills | Status: DC | PRN

## 2024-02-23 NOTE — Telephone Encounter (Signed)
 Scheduled the ct and follow up for this pt. Called his daughter to let her know. She said that he isn't listening to her anymore and wont talk about what is going on with him. She said that he is basically in his bed all the time now and she is concerned that he has just given up. He told her that his legs are weak and he now needs a cane to get around because of the lack of movement. She also said that he wont shower unless someone is at home with him because he is worried that he will fall. She wanted me to let you know this and see what you are thinking. I told her I could give her a call once I heard back.

## 2024-03-08 ENCOUNTER — Ambulatory Visit: Admission: RE | Admit: 2024-03-08 | Payer: Medicaid Other | Source: Ambulatory Visit

## 2024-03-14 ENCOUNTER — Ambulatory Visit: Admission: RE | Admit: 2024-03-14 | Source: Ambulatory Visit

## 2024-03-19 ENCOUNTER — Telehealth: Payer: Self-pay | Admitting: *Deleted

## 2024-03-19 DIAGNOSIS — C3412 Malignant neoplasm of upper lobe, left bronchus or lung: Secondary | ICD-10-CM

## 2024-03-19 NOTE — Telephone Encounter (Signed)
 Wesley Ferguson wants to speak to Wesley Ferguson or the Nurse to help get him palliative .

## 2024-03-19 NOTE — Telephone Encounter (Signed)
Can you call patients daughter

## 2024-03-19 NOTE — Telephone Encounter (Signed)
 I called and spoke with patient's daughter.  Daughter reports that patient is declining.  Minimal oral intake and progressive weakness.  Patient having difficulty standing and ambulating.  Also having difficulty communicating with impaired vocal quality.  Daughter feels that patient is nearing end-of-life and wants to focus on keeping him home and comfortable.  We discussed hospice involvement in detail and daughter requested referral for hospice services.

## 2024-03-20 DIAGNOSIS — C342 Malignant neoplasm of middle lobe, bronchus or lung: Secondary | ICD-10-CM | POA: Diagnosis not present

## 2024-03-20 DIAGNOSIS — J439 Emphysema, unspecified: Secondary | ICD-10-CM | POA: Diagnosis not present

## 2024-03-21 ENCOUNTER — Telehealth: Payer: Self-pay | Admitting: *Deleted

## 2024-03-21 ENCOUNTER — Other Ambulatory Visit: Payer: Self-pay | Admitting: Hospice and Palliative Medicine

## 2024-03-21 DIAGNOSIS — C342 Malignant neoplasm of middle lobe, bronchus or lung: Secondary | ICD-10-CM | POA: Diagnosis not present

## 2024-03-21 DIAGNOSIS — J439 Emphysema, unspecified: Secondary | ICD-10-CM | POA: Diagnosis not present

## 2024-03-21 MED ORDER — ONDANSETRON HCL 4 MG PO TABS: 4.0000 mg | ORAL_TABLET | Freq: Three times a day (TID) | ORAL | 0 refills | Status: DC | PRN

## 2024-03-21 MED ORDER — PROCHLORPERAZINE MALEATE 10 MG PO TABS: 10.0000 mg | ORAL_TABLET | Freq: Four times a day (QID) | ORAL | 1 refills | Status: DC | PRN

## 2024-03-21 NOTE — Telephone Encounter (Signed)
 I spoke to the NP Josh and he ordered Both compazine and Zofran. Fleet Contras also said she is getting a concentrator for in the house and he knows that he will cut off oxygen when he smokes. When I calle Fleet Contras I called back she asked about miralax that he does not drink enough to take it so she is changing it to senna kot s

## 2024-03-21 NOTE — Progress Notes (Signed)
 Hospice RN requested antiemetics. Refills sent for Zofran and Compazine.

## 2024-03-22 DIAGNOSIS — C342 Malignant neoplasm of middle lobe, bronchus or lung: Secondary | ICD-10-CM | POA: Diagnosis not present

## 2024-03-22 DIAGNOSIS — J439 Emphysema, unspecified: Secondary | ICD-10-CM | POA: Diagnosis not present

## 2024-03-23 DIAGNOSIS — C342 Malignant neoplasm of middle lobe, bronchus or lung: Secondary | ICD-10-CM | POA: Diagnosis not present

## 2024-03-23 DIAGNOSIS — J439 Emphysema, unspecified: Secondary | ICD-10-CM | POA: Diagnosis not present

## 2024-03-24 DIAGNOSIS — J439 Emphysema, unspecified: Secondary | ICD-10-CM | POA: Diagnosis not present

## 2024-03-24 DIAGNOSIS — C342 Malignant neoplasm of middle lobe, bronchus or lung: Secondary | ICD-10-CM | POA: Diagnosis not present

## 2024-03-25 DIAGNOSIS — C342 Malignant neoplasm of middle lobe, bronchus or lung: Secondary | ICD-10-CM | POA: Diagnosis not present

## 2024-03-25 DIAGNOSIS — J439 Emphysema, unspecified: Secondary | ICD-10-CM | POA: Diagnosis not present

## 2024-03-26 ENCOUNTER — Inpatient Hospital Stay: Payer: Medicaid Other | Admitting: Oncology

## 2024-03-28 ENCOUNTER — Other Ambulatory Visit: Payer: Self-pay | Admitting: Hospice and Palliative Medicine

## 2024-03-28 MED ORDER — MORPHINE SULFATE ER 30 MG PO TBCR: 30.0000 mg | EXTENDED_RELEASE_TABLET | Freq: Three times a day (TID) | ORAL | 0 refills | Status: DC

## 2024-03-28 NOTE — Progress Notes (Signed)
 Received a call from [facility].  Reportedly, patient's pain is poorly controlled on MS Contin 30 mg every 12 hours.  Patient is still requiring oxycodone 10 mg every 4 hours for breakthrough pain.  Still rates pain as 7 out of 10.  Waking in pain in the morning.  Recommended increasing MS Contin 30 mg to every 8 hour dosing.  Will send new Rx to pharmacy.

## 2024-04-03 ENCOUNTER — Other Ambulatory Visit: Payer: Self-pay | Admitting: Hospice and Palliative Medicine

## 2024-04-03 MED ORDER — LORAZEPAM 0.5 MG PO TABS: 0.5000 mg | ORAL_TABLET | ORAL | 0 refills | Status: DC | PRN

## 2024-04-03 MED ORDER — MORPHINE SULFATE (CONCENTRATE) 10 MG /0.5 ML PO SOLN: 5.0000 mg | ORAL | 0 refills | Status: DC | PRN

## 2024-04-03 NOTE — Progress Notes (Signed)
 Received a call from hospice nurse, Walden Field.  Reportedly, patient had episode of hypoxia earlier today, which corrected with increase in O2 to 4 L.  Patient was having shortness of breath.  No fever or chills.  Patient not interested in transferring to either the hospital or hospice [facility] for symptom management/evaluation.  Wanted to stay home and focus on comfort.  Rx sent for morphine elixir and lorazepam for comfort.

## 2024-04-04 ENCOUNTER — Telehealth: Payer: Self-pay

## 2024-04-04 NOTE — Telephone Encounter (Signed)
 Received PA request for Lorazepam rx sent by [facility] yesterday.  The rx had "Hospice patient" provided.  Called pharmacy to let them know that patient is in Hospice Care.  Pharmacy states they have not been provided Hospice billing information for patient.  Spoke to [facility] triage nurse who is going to provide the billing information to pharmacy.

## 2024-04-23 ENCOUNTER — Other Ambulatory Visit: Payer: Self-pay | Admitting: Hospice and Palliative Medicine

## 2024-04-23 MED ORDER — MORPHINE SULFATE (CONCENTRATE) 10 MG /0.5 ML PO SOLN: 10.0000 mg | ORAL | 0 refills | Status: DC | PRN

## 2024-04-23 NOTE — Progress Notes (Signed)
 Received call from his hospice nurse, Herma Longest.  Patient no longer able to swallow.  Appears actively dying.  No longer taking MS Contin  or oxycodone .  Is now taking morphine  oral concentrate 10 mg every 2-3 hours.  Still symptomatic with pain/dyspnea/nonverbal cues.  Okay to liberalize to 10 to 20 mg every 2 hours as needed. Will send refill.

## 2024-05-26 DEATH — deceased
